# Patient Record
Sex: Female | Born: 1960 | Race: White | Hispanic: No | Smoking: Current every day smoker
Health system: Southern US, Community
[De-identification: ages and names within clinical notes are randomized; demographics above are authoritative.]

## PROBLEM LIST (undated history)

## (undated) DIAGNOSIS — D869 Sarcoidosis, unspecified: Secondary | ICD-10-CM

## (undated) DIAGNOSIS — R87619 Unspecified abnormal cytological findings in specimens from cervix uteri: Secondary | ICD-10-CM

## (undated) DIAGNOSIS — R609 Edema, unspecified: Secondary | ICD-10-CM

## (undated) DIAGNOSIS — D72829 Elevated white blood cell count, unspecified: Secondary | ICD-10-CM

## (undated) DIAGNOSIS — F329 Major depressive disorder, single episode, unspecified: Secondary | ICD-10-CM

## (undated) DIAGNOSIS — J329 Chronic sinusitis, unspecified: Secondary | ICD-10-CM

## (undated) DIAGNOSIS — N951 Menopausal and female climacteric states: Secondary | ICD-10-CM

## (undated) DIAGNOSIS — R600 Localized edema: Secondary | ICD-10-CM

## (undated) DIAGNOSIS — I10 Essential (primary) hypertension: Secondary | ICD-10-CM

## (undated) DIAGNOSIS — G894 Chronic pain syndrome: Secondary | ICD-10-CM

## (undated) DIAGNOSIS — M542 Cervicalgia: Secondary | ICD-10-CM

## (undated) DIAGNOSIS — F32A Depression, unspecified: Secondary | ICD-10-CM

## (undated) DIAGNOSIS — R945 Abnormal results of liver function studies: Secondary | ICD-10-CM

## (undated) DIAGNOSIS — E785 Hyperlipidemia, unspecified: Secondary | ICD-10-CM

## (undated) HISTORY — DX: Abnormal results of liver function studies: R94.5

## (undated) HISTORY — DX: Edema, unspecified: R60.9

## (undated) HISTORY — DX: Menopausal and female climacteric states: N95.1

## (undated) HISTORY — DX: Chronic sinusitis, unspecified: J32.9

## (undated) HISTORY — DX: Elevated white blood cell count, unspecified: D72.829

## (undated) HISTORY — DX: Hyperlipidemia, unspecified: E78.5

## (undated) HISTORY — DX: Major depressive disorder, single episode, unspecified: F32.9

## (undated) HISTORY — PX: TUBAL LIGATION: SHX77

## (undated) HISTORY — DX: Localized edema: R60.0

## (undated) HISTORY — DX: Sarcoidosis, unspecified: D86.9

## (undated) HISTORY — PX: OTHER SURGICAL HISTORY: SHX169

## (undated) HISTORY — DX: Unspecified abnormal cytological findings in specimens from cervix uteri: R87.619

## (undated) HISTORY — DX: Essential (primary) hypertension: I10

## (undated) HISTORY — DX: Cervicalgia: M54.2

## (undated) HISTORY — DX: Depression, unspecified: F32.A

---

## 1998-05-24 ENCOUNTER — Other Ambulatory Visit: Admission: RE | Admit: 1998-05-24 | Discharge: 1998-05-24 | Payer: Self-pay | Admitting: *Deleted

## 1998-11-12 ENCOUNTER — Emergency Department (HOSPITAL_COMMUNITY): Admission: EM | Admit: 1998-11-12 | Discharge: 1998-11-13 | Payer: Self-pay

## 1999-12-26 ENCOUNTER — Other Ambulatory Visit: Admission: RE | Admit: 1999-12-26 | Discharge: 1999-12-26 | Payer: Self-pay | Admitting: *Deleted

## 2000-03-26 ENCOUNTER — Ambulatory Visit (HOSPITAL_COMMUNITY): Admission: RE | Admit: 2000-03-26 | Discharge: 2000-03-26 | Payer: Self-pay | Admitting: *Deleted

## 2000-06-18 ENCOUNTER — Encounter: Payer: Self-pay | Admitting: *Deleted

## 2000-06-18 ENCOUNTER — Encounter: Admission: RE | Admit: 2000-06-18 | Discharge: 2000-06-18 | Payer: Self-pay | Admitting: *Deleted

## 2001-07-28 ENCOUNTER — Other Ambulatory Visit: Admission: RE | Admit: 2001-07-28 | Discharge: 2001-07-28 | Payer: Self-pay | Admitting: *Deleted

## 2001-09-14 ENCOUNTER — Other Ambulatory Visit: Admission: RE | Admit: 2001-09-14 | Discharge: 2001-09-14 | Payer: Self-pay | Admitting: *Deleted

## 2003-05-04 ENCOUNTER — Encounter: Payer: Self-pay | Admitting: Internal Medicine

## 2003-05-04 ENCOUNTER — Encounter: Admission: RE | Admit: 2003-05-04 | Discharge: 2003-05-04 | Payer: Self-pay | Admitting: Internal Medicine

## 2004-06-30 ENCOUNTER — Ambulatory Visit: Payer: Self-pay | Admitting: Internal Medicine

## 2004-07-08 ENCOUNTER — Ambulatory Visit: Payer: Self-pay | Admitting: Family Medicine

## 2004-10-13 ENCOUNTER — Encounter: Admission: RE | Admit: 2004-10-13 | Discharge: 2004-10-13 | Payer: Self-pay | Admitting: Obstetrics and Gynecology

## 2004-11-06 ENCOUNTER — Ambulatory Visit: Payer: Self-pay | Admitting: Family Medicine

## 2005-03-04 ENCOUNTER — Ambulatory Visit: Payer: Self-pay | Admitting: Internal Medicine

## 2005-07-28 ENCOUNTER — Ambulatory Visit: Payer: Self-pay | Admitting: Internal Medicine

## 2006-03-03 ENCOUNTER — Ambulatory Visit: Payer: Self-pay | Admitting: Internal Medicine

## 2006-09-15 ENCOUNTER — Ambulatory Visit: Payer: Self-pay | Admitting: Family Medicine

## 2006-12-03 ENCOUNTER — Ambulatory Visit: Payer: Self-pay | Admitting: Internal Medicine

## 2006-12-03 LAB — CONVERTED CEMR LAB
ALT: 55 units/L — ABNORMAL HIGH (ref 0–35)
AST: 48 units/L — ABNORMAL HIGH (ref 0–37)
Albumin: 4 g/dL (ref 3.5–5.2)
Alkaline Phosphatase: 84 units/L (ref 39–117)
BUN: 11 mg/dL (ref 6–23)
Bilirubin Urine: NEGATIVE
Bilirubin, Direct: 0.1 mg/dL (ref 0.0–0.3)
CO2: 23 meq/L (ref 19–32)
Calcium: 8.9 mg/dL (ref 8.4–10.5)
Chloride: 102 meq/L (ref 96–112)
Cholesterol: 141 mg/dL (ref 0–200)
Creatinine, Ser: 0.7 mg/dL (ref 0.40–1.20)
Glucose, Bld: 164 mg/dL — ABNORMAL HIGH (ref 70–99)
HCT: 45.1 % (ref 36.0–46.0)
HDL: 24 mg/dL — ABNORMAL LOW (ref >39)
Hemoglobin: 15.5 g/dL — ABNORMAL HIGH (ref 12.0–15.0)
Hgb A1c MFr Bld: 9.5 % — ABNORMAL HIGH (ref 4.6–6.1)
Indirect Bilirubin: 0.7 mg/dL (ref 0.0–0.9)
Ketones, ur: NEGATIVE mg/dL
LDL Cholesterol: 85 mg/dL (ref 0–99)
MCHC: 34.4 g/dL (ref 30.0–36.0)
MCV: 91.9 fL (ref 78.0–100.0)
Microalb, Ur: 2.7 mg/dL — ABNORMAL HIGH (ref 0.00–1.89)
Nitrite: POSITIVE — AB
Platelets: 257 10*3/uL (ref 150–400)
Potassium: 4.1 meq/L (ref 3.5–5.3)
Protein, ur: NEGATIVE mg/dL
RBC / HPF: NONE SEEN (ref <3)
RBC: 4.91 M/uL (ref 3.87–5.11)
RDW: 13.3 % (ref 11.5–14.0)
Sodium: 140 meq/L (ref 135–145)
Specific Gravity, Urine: 1.026 (ref 1.005–1.03)
TSH: 1.882 microintl units/mL (ref 0.350–5.50)
Total Bilirubin: 0.8 mg/dL (ref 0.3–1.2)
Total CHOL/HDL Ratio: 5.9
Total Protein: 7.3 g/dL (ref 6.0–8.3)
Triglycerides: 160 mg/dL — ABNORMAL HIGH (ref <150)
Urine Glucose: NEGATIVE mg/dL
Urobilinogen, UA: 0.2 (ref 0.0–1.0)
VLDL: 32 mg/dL (ref 0–40)
WBC: 12.5 10*3/uL — ABNORMAL HIGH (ref 4.0–10.5)
pH: 5.5 (ref 5.0–8.0)

## 2007-02-03 ENCOUNTER — Telehealth (INDEPENDENT_AMBULATORY_CARE_PROVIDER_SITE_OTHER): Payer: Self-pay | Admitting: *Deleted

## 2007-02-10 ENCOUNTER — Ambulatory Visit: Payer: Self-pay | Admitting: Internal Medicine

## 2007-02-10 DIAGNOSIS — E119 Type 2 diabetes mellitus without complications: Secondary | ICD-10-CM | POA: Insufficient documentation

## 2007-02-10 DIAGNOSIS — R87619 Unspecified abnormal cytological findings in specimens from cervix uteri: Secondary | ICD-10-CM

## 2007-02-10 DIAGNOSIS — E7800 Pure hypercholesterolemia, unspecified: Secondary | ICD-10-CM | POA: Insufficient documentation

## 2007-02-10 DIAGNOSIS — I10 Essential (primary) hypertension: Secondary | ICD-10-CM | POA: Insufficient documentation

## 2007-02-10 DIAGNOSIS — F329 Major depressive disorder, single episode, unspecified: Secondary | ICD-10-CM | POA: Insufficient documentation

## 2007-02-23 ENCOUNTER — Encounter: Payer: Self-pay | Admitting: Internal Medicine

## 2007-07-19 ENCOUNTER — Ambulatory Visit: Payer: Self-pay | Admitting: Internal Medicine

## 2007-07-19 LAB — CONVERTED CEMR LAB
Bilirubin Urine: NEGATIVE
Blood in Urine, dipstick: NEGATIVE
Glucose, Urine, Semiquant: NEGATIVE
Nitrite: NEGATIVE
Protein, U semiquant: NEGATIVE
RBC / HPF: NONE SEEN (ref <3)
Specific Gravity, Urine: 1.02
Urobilinogen, UA: NEGATIVE
WBC Urine, dipstick: NEGATIVE
WBC, UA: NONE SEEN cells/hpf (ref <3)
pH: 5

## 2007-07-20 ENCOUNTER — Encounter: Payer: Self-pay | Admitting: Internal Medicine

## 2007-07-27 ENCOUNTER — Telehealth: Payer: Self-pay | Admitting: Internal Medicine

## 2007-07-27 ENCOUNTER — Ambulatory Visit: Payer: Self-pay | Admitting: Internal Medicine

## 2007-07-27 LAB — CONVERTED CEMR LAB
HCV Ab: NEGATIVE
Hep A Total Ab: NEGATIVE
Hep B Core Total Ab: NEGATIVE
Hep B E Ab: NEGATIVE
Hep B S Ab: NEGATIVE

## 2007-07-29 LAB — CONVERTED CEMR LAB
ALT: 44 units/L — ABNORMAL HIGH (ref 0–35)
AST: 43 units/L — ABNORMAL HIGH (ref 0–37)
Albumin: 3.1 g/dL — ABNORMAL LOW (ref 3.5–5.2)
Alkaline Phosphatase: 74 units/L (ref 39–117)
BUN: 11 mg/dL (ref 6–23)
Bilirubin, Direct: 0.1 mg/dL (ref 0.0–0.3)
CO2: 29 meq/L (ref 19–32)
Calcium: 9.4 mg/dL (ref 8.4–10.5)
Chloride: 96 meq/L (ref 96–112)
Creatinine, Ser: 0.7 mg/dL (ref 0.4–1.2)
Creatinine,U: 331.2 mg/dL
GFR calc Af Amer: 115 mL/min
GFR calc non Af Amer: 95 mL/min
Glucose, Bld: 131 mg/dL — ABNORMAL HIGH (ref 70–99)
Hgb A1c MFr Bld: 7.7 % — ABNORMAL HIGH (ref 4.6–6.0)
Microalb Creat Ratio: 5.7 mg/g (ref 0.0–30.0)
Microalb, Ur: 1.9 mg/dL (ref 0.0–1.9)
Potassium: 3.9 meq/L (ref 3.5–5.1)
Sodium: 134 meq/L — ABNORMAL LOW (ref 135–145)
Total Bilirubin: 1.1 mg/dL (ref 0.3–1.2)
Total Protein: 8 g/dL (ref 6.0–8.3)

## 2007-10-03 ENCOUNTER — Telehealth: Payer: Self-pay | Admitting: Internal Medicine

## 2007-10-25 ENCOUNTER — Ambulatory Visit: Payer: Self-pay | Admitting: Internal Medicine

## 2007-10-25 DIAGNOSIS — N951 Menopausal and female climacteric states: Secondary | ICD-10-CM | POA: Insufficient documentation

## 2007-10-25 LAB — CONVERTED CEMR LAB
Bacteria, UA: NONE SEEN
Bilirubin Urine: NEGATIVE
Glucose, Urine, Semiquant: NEGATIVE
Ketones, urine, test strip: NEGATIVE
RBC / HPF: NONE SEEN (ref <3)
Specific Gravity, Urine: 1.01
WBC, UA: NONE SEEN cells/hpf (ref <3)
pH: 5

## 2007-10-26 ENCOUNTER — Encounter: Payer: Self-pay | Admitting: Internal Medicine

## 2008-02-01 ENCOUNTER — Telehealth (INDEPENDENT_AMBULATORY_CARE_PROVIDER_SITE_OTHER): Payer: Self-pay | Admitting: *Deleted

## 2008-02-08 ENCOUNTER — Ambulatory Visit: Payer: Self-pay | Admitting: Internal Medicine

## 2008-02-08 DIAGNOSIS — F909 Attention-deficit hyperactivity disorder, unspecified type: Secondary | ICD-10-CM | POA: Insufficient documentation

## 2008-02-08 LAB — CONVERTED CEMR LAB
Nitrite: NEGATIVE
Specific Gravity, Urine: >=1.03
Urobilinogen, UA: 0.2
pH: 5

## 2008-02-09 ENCOUNTER — Ambulatory Visit: Payer: Self-pay | Admitting: Internal Medicine

## 2008-02-10 ENCOUNTER — Encounter: Payer: Self-pay | Admitting: Internal Medicine

## 2008-02-15 ENCOUNTER — Telehealth (INDEPENDENT_AMBULATORY_CARE_PROVIDER_SITE_OTHER): Payer: Self-pay | Admitting: *Deleted

## 2008-02-15 LAB — CONVERTED CEMR LAB
ALT: 38 units/L — ABNORMAL HIGH (ref 0–35)
AST: 38 units/L — ABNORMAL HIGH (ref 0–37)
Albumin: 3.7 g/dL (ref 3.5–5.2)
BUN: 9 mg/dL (ref 6–23)
CO2: 29 meq/L (ref 19–32)
Chloride: 103 meq/L (ref 96–112)
Creatinine, Ser: 0.6 mg/dL (ref 0.4–1.2)
Glucose, Bld: 202 mg/dL — ABNORMAL HIGH (ref 70–99)
Hgb A1c MFr Bld: 8.5 % — ABNORMAL HIGH (ref 4.6–6.0)
Total Bilirubin: 0.7 mg/dL (ref 0.3–1.2)
Total Protein: 7.6 g/dL (ref 6.0–8.3)

## 2008-06-01 ENCOUNTER — Ambulatory Visit: Payer: Self-pay | Admitting: Internal Medicine

## 2008-06-06 ENCOUNTER — Telehealth (INDEPENDENT_AMBULATORY_CARE_PROVIDER_SITE_OTHER): Payer: Self-pay | Admitting: *Deleted

## 2008-06-06 LAB — CONVERTED CEMR LAB
ALT: 41 units/L — ABNORMAL HIGH (ref 0–35)
Alkaline Phosphatase: 65 units/L (ref 39–117)
Bilirubin, Direct: 0.1 mg/dL (ref 0.0–0.3)
Eosinophils Relative: 1.2 % (ref 0.0–5.0)
HCT: 40.2 % (ref 36.0–46.0)
HDL: 19.7 mg/dL — ABNORMAL LOW (ref >39.0)
Monocytes Absolute: 0.6 10*3/uL (ref 0.1–1.0)
Monocytes Relative: 5.5 % (ref 3.0–12.0)
Platelets: 285 10*3/uL (ref 150–400)
RBC: 4.38 M/uL (ref 3.87–5.11)
Total CHOL/HDL Ratio: 8.8
Total Protein: 8 g/dL (ref 6.0–8.3)
WBC: 11.4 10*3/uL — ABNORMAL HIGH (ref 4.5–10.5)

## 2008-07-27 ENCOUNTER — Ambulatory Visit: Payer: Self-pay | Admitting: Internal Medicine

## 2008-11-30 ENCOUNTER — Encounter: Payer: Self-pay | Admitting: Internal Medicine

## 2009-01-21 ENCOUNTER — Ambulatory Visit: Payer: Self-pay | Admitting: Internal Medicine

## 2009-02-07 ENCOUNTER — Ambulatory Visit: Payer: Self-pay | Admitting: Internal Medicine

## 2009-02-08 ENCOUNTER — Encounter (INDEPENDENT_AMBULATORY_CARE_PROVIDER_SITE_OTHER): Payer: Self-pay | Admitting: *Deleted

## 2009-02-13 ENCOUNTER — Telehealth: Payer: Self-pay | Admitting: Internal Medicine

## 2009-02-13 ENCOUNTER — Encounter (INDEPENDENT_AMBULATORY_CARE_PROVIDER_SITE_OTHER): Payer: Self-pay | Admitting: *Deleted

## 2009-02-13 ENCOUNTER — Telehealth (INDEPENDENT_AMBULATORY_CARE_PROVIDER_SITE_OTHER): Payer: Self-pay | Admitting: *Deleted

## 2009-02-13 LAB — CONVERTED CEMR LAB
Albumin: 3.7 g/dL (ref 3.5–5.2)
Cholesterol: 168 mg/dL (ref 0–200)
GFR calc non Af Amer: 139.49 mL/min (ref >60)
Hgb A1c MFr Bld: 8.4 % — ABNORMAL HIGH (ref 4.6–6.5)
LDL Cholesterol: 114 mg/dL — ABNORMAL HIGH (ref 0–99)
Potassium: 4.3 meq/L (ref 3.5–5.1)
Sodium: 142 meq/L (ref 135–145)
Total Protein: 8.1 g/dL (ref 6.0–8.3)
Triglycerides: 185 mg/dL — ABNORMAL HIGH (ref 0.0–149.0)
VLDL: 37 mg/dL (ref 0.0–40.0)
Vit D, 25-Hydroxy: 24 ng/mL — ABNORMAL LOW (ref 30–89)

## 2009-02-20 ENCOUNTER — Ambulatory Visit: Payer: Self-pay | Admitting: Gastroenterology

## 2009-03-11 ENCOUNTER — Ambulatory Visit: Payer: Self-pay | Admitting: Gastroenterology

## 2009-03-13 ENCOUNTER — Ambulatory Visit: Payer: Self-pay | Admitting: Endocrinology

## 2009-03-15 ENCOUNTER — Encounter: Admission: RE | Admit: 2009-03-15 | Discharge: 2009-06-13 | Payer: Self-pay | Admitting: Internal Medicine

## 2009-04-26 ENCOUNTER — Telehealth: Payer: Self-pay | Admitting: Internal Medicine

## 2009-04-26 ENCOUNTER — Encounter: Payer: Self-pay | Admitting: Internal Medicine

## 2009-05-17 ENCOUNTER — Ambulatory Visit: Payer: Self-pay | Admitting: Internal Medicine

## 2009-07-16 ENCOUNTER — Ambulatory Visit: Payer: Self-pay | Admitting: Internal Medicine

## 2009-07-16 DIAGNOSIS — J329 Chronic sinusitis, unspecified: Secondary | ICD-10-CM

## 2009-07-16 HISTORY — DX: Chronic sinusitis, unspecified: J32.9

## 2009-10-09 ENCOUNTER — Encounter: Payer: Self-pay | Admitting: Internal Medicine

## 2009-10-29 ENCOUNTER — Ambulatory Visit: Payer: Self-pay | Admitting: Internal Medicine

## 2009-10-29 DIAGNOSIS — M79609 Pain in unspecified limb: Secondary | ICD-10-CM | POA: Insufficient documentation

## 2010-05-28 ENCOUNTER — Telehealth (INDEPENDENT_AMBULATORY_CARE_PROVIDER_SITE_OTHER): Payer: Self-pay | Admitting: *Deleted

## 2010-05-28 ENCOUNTER — Ambulatory Visit: Payer: Self-pay | Admitting: Internal Medicine

## 2010-05-28 ENCOUNTER — Telehealth: Payer: Self-pay | Admitting: Internal Medicine

## 2010-05-28 LAB — CONVERTED CEMR LAB
Glucose, Urine, Semiquant: 500
Urobilinogen, UA: 0.2
pH: 6

## 2010-05-29 ENCOUNTER — Encounter: Payer: Self-pay | Admitting: Internal Medicine

## 2010-06-02 ENCOUNTER — Encounter: Payer: Self-pay | Admitting: Internal Medicine

## 2010-08-31 ENCOUNTER — Encounter: Payer: Self-pay | Admitting: Internal Medicine

## 2010-09-09 NOTE — Assessment & Plan Note (Signed)
Summary: FOR A UTI //PH   Vital Signs:  Patient profile:   50 year old female Height:      66.75 inches Weight:      229.13 pounds BMI:     36.29 Pulse rate:   95 / minute Pulse rhythm:   regular BP sitting:   126 / 88  (left arm) Cuff size:   large  Vitals Entered By: Army Fossa CMA (May 28, 2010 11:27 AM) CC: Pt here c/o frequent urination x 2 weeks Comments [facility] [address]     History of Present Illness: acute visit for a UTI... For few weeks, she has noticed urinary frequency and urgency. No dysuria.  we also have to discuss her chronic issues....  Diabetes-- runned out of meds x 2 weeks ($$); before that, she self decrease Actos to half tablet a day and metformin to only 3 tablets daily. Even with a decrease of medications, her CBGs were good. she is concerned about the side effects of Actos (bladder cancer ) and metformin  Hypertension-- out of meds x 2 weeks    DYSLIPIDEMIA--out of meds x 2 weeks   abnormal Paps, was referred to gynecology last year, patient reports she did go, PAP was normal  ROS No fever No nausea vomiting No abdominal pain or flank pain No vaginal discharge or vaginal rash Diet is not as good as before but she is exercising more     Current Medications (verified): 1)  Januvia 100 Mg  Tabs (Sitagliptin Phosphate) .... One P.o. Daily 2)  Claritin 10 Mg  Tabs (Loratadine) .Marland Kitchen.. 1 By Mouth Once Daily 3)  Ibuprofen 200 Mg  Caps (Ibuprofen) .... 2 By Mouth Once Daily 4)  Ziac 2.5-6.25 Mg  Tabs (Bisoprolol-Hydrochlorothiazide) .Marland Kitchen.. 1 By Mouth Once Daily 5)  Lexapro 10 Mg  Tabs (Escitalopram Oxalate) .... 1.5 To 2 By Mouth Once Daily 6)  Simvastatin 20 Mg Tabs (Simvastatin) .Marland Kitchen.. 1 By Mouth At Bedtime 7)  Metformin Hcl 500 Mg Xr24h-Tab (Metformin Hcl) .... 4 Qd 8)  Actos 45 Mg Tabs (Pioglitazone Hcl) .Marland Kitchen.. 1 Qd 9)  Bayer Low Strength 81 Mg Tbec (Aspirin) .... 3 By Mouth Once Daily 10)  Fish Oil 11)  Vicodin 5-500 Mg Tabs  (Hydrocodone-Acetaminophen) .Marland Kitchen.. 1 or 2 By Mouth  Q  8 Hours As Needed  Allergies (verified): 1)  ! Prozac 2)  ! Effexor 3)  ! Erythromycin  Past History:  Past Medical History: Reviewed history from 02/07/2009 and no changes required. Sarcoid-post partum- on  Remission Diabetes mellitus, type II Depression Hypertension MENOPAUSAL SYNDROME  DYSLIPIDEMIA PAP SMEAR, ABNORMAL , h/o HPV. Repeated PAP neg   Past Surgical History: Reviewed history from 02/07/2009 and no changes required. 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   Lungs:  Normal respiratory effort, chest expands symmetrically. Lungs are clear to auscultation, no crackles or wheezes. Heart:  normal rate, regular rhythm, no murmur, and no gallop.   Abdomen:  soft, non-tender, no distention, no masses, no guarding, and no rigidity.  no CVA tenderness Extremities:  no lower extremity pretibial edema   Psych:  not anxious  appearing and not depressed appearing.     Impression & Recommendations:  Problem # 1:  UTI? UA negative, we'll send a urine culture Symptoms may be related to poorly controlled diabetes  Problem # 2:  HYPERTENSION (ICD-401.9) BP today good despite poor compliance, no change Her updated medication list for this problem includes:    Ziac 2.5-6.25 Mg Tabs (Bisoprolol-hydrochlorothiazide) .Marland Kitchen... 1 by mouth once daily  BP today: 126/88 Prior BP: 128/70 (10/29/2009)  Labs Reviewed: K+: 4.3 (02/07/2009) Creat: : 0.5 (02/07/2009)   Chol: 168 (02/07/2009)   HDL: 17.20 (02/07/2009)   LDL: 114 (02/07/2009)    TG: 185.0 (02/07/2009)  Problem # 3:  DYSLIPIDEMIA (ICD-272.4) poor compliance, medications refilled Her updated medication list for this problem includes:    Simvastatin 20 Mg Tabs (Simvastatin) .Marland Kitchen... 1 by mouth at bedtime  Labs Reviewed: SGOT: 51 (02/07/2009)   SGPT: 57 (02/07/2009)   HDL:17.20 (02/07/2009), 19.7 (06/01/2008)  LDL:114 (02/07/2009), 123 (06/01/2008)  Chol:168 (02/07/2009), 174 (06/01/2008)  Trig:185.0 (02/07/2009), 159 (06/01/2008)  Problem # 4:  DIABETES MELLITUS, TYPE II (ICD-250.00) poor compliance Additionally, she self decrease Actos to half tablet a day and metformin to only 3 tablets daily. Even with a decrease of medications, her CBGs were good consequently will decrease her routine doses.  Her updated medication list for this problem includes:    Januvia 100 Mg Tabs (Sitagliptin phosphate) ..... One p.o. daily    Metformin Hcl 500 Mg Xr24h-tab (Metformin hcl) .Marland KitchenMarland KitchenMarland KitchenMarland Kitchen 3 by mouth once daily    Actos 30 Mg Tabs (Pioglitazone hcl) .Marland Kitchen... 1 by mouth once daily    Bayer Low Strength 81 Mg Tbec (Aspirin) .Marland KitchenMarland KitchenMarland KitchenMarland Kitchen 3 by mouth once daily  Problem # 5:  HEALTH SCREENING (ICD-V70.0) abnormal Paps, was referred to gynecology last year, patient reports she did go, PAP was normal  Problem # 6:  time spent face-to-face more than 25 minutes trying to figure out a  plan for her diabetes treatment  Complete Medication List: 1)  Januvia 100 Mg Tabs (Sitagliptin phosphate) .... One p.o. daily 2)  Claritin 10 Mg Tabs (Loratadine) .Marland Kitchen.. 1 by mouth once daily 3)  Ibuprofen 200 Mg Caps (Ibuprofen) .... 2 by mouth once daily 4)  Ziac 2.5-6.25 Mg Tabs (Bisoprolol-hydrochlorothiazide) .Marland Kitchen.. 1 by mouth once daily 5)  Lexapro 10 Mg Tabs (Escitalopram oxalate) .... 1.5 to 2 by mouth once daily 6)  Simvastatin 20 Mg Tabs (Simvastatin) .Marland Kitchen.. 1 by mouth at bedtime 7)  Metformin Hcl 500 Mg Xr24h-tab (Metformin hcl) .... 3 by mouth once daily 8)  Actos 30 Mg Tabs (Pioglitazone hcl) .Marland Kitchen.. 1 by mouth  once daily 9)  Bayer Low Strength 81 Mg Tbec (Aspirin) .... 3 by mouth once daily 10)  Fish Oil  11)  Vicodin 5-500 Mg Tabs (Hydrocodone-acetaminophen) .Marland Kitchen.. 1 or 2 by mouth  q  8 hours as needed  Other Orders: Admin 1st Vaccine ([address]) Flu Vaccine 21yrs + ([address]) T-Urine Culture (Spectrum Order) [id number])  Patient Instructions: 1)  take the  medicines as prescribed every day 2)  Diet and exercise! 3)  Get your yearly eye check 4)  Come back in 6 weeks, fasting Prescriptions: VICODIN 5-500 MG TABS (HYDROCODONE-ACETAMINOPHEN) 1 or 2 by mouth  q  8 hours as needed  #40 x 0   Entered and Authorized by:   Elita Quick E. Khalon Cansler MD   Signed by:   Nolon Rod. Tyheim Vanalstyne MD on 05/28/2010   Method used:   Print then Give to Patient   RxID:  [id number] METFORMIN HCL 500 MG XR24H-TAB (METFORMIN HCL) 3 by mouth once daily  #90 x 3   Entered and Authorized by:   Elita Quick E. Nadezhda Pollitt MD   Signed by:   Nolon Rod. Tenee Wish MD on 05/28/2010   Method used:   Electronically to        [facility] [address] [place] (retail)       [phone]       [place], [place]  [address]       Ph: [phone]       Fax: [phone]   RxID:   [id number] ACTOS 30 MG TABS (PIOGLITAZONE HCL) 1 by mouth once daily  #30 x 3   Entered and Authorized by:   Nolon Rod. Latecia Miler MD   Signed by:   Nolon Rod. Chyanna Flock MD on 05/28/2010   Method used:   Electronically to        [facility] [address] [place] (retail)       [phone]       [place], [place]  [address]       Ph: [phone]       Fax: [phone]   RxID:   [id number] JANUVIA 100 MG  TABS (SITAGLIPTIN PHOSPHATE) one p.o. daily  #30 Each x 3   Entered by:   Army Fossa CMA   Authorized by:   Nolon Rod. Jemiah Cuadra MD   Signed by:   Nolon Rod. Trevor Wilkie MD on 05/28/2010   Method used:   Electronically to        [facility] [address] [place] (retail)       [phone]       [place], [place]  [address]       Ph:  [phone]       Fax: [phone]   RxID:   [id number] SIMVASTATIN 20 MG TABS (SIMVASTATIN) 1 by mouth at bedtime  #30 Each x 3   Entered by:   Army Fossa CMA   Authorized by:   Nolon Rod. Climmie Cronce MD   Signed by:   Nolon Rod. Lariyah Shetterly MD on 05/28/2010   Method used:   Electronically to        [facility] [address] [place] (retail)       [phone]       [place], [place]  [address]       Ph: [phone]       Fax: [phone]   RxID:   [id number] LEXAPRO 10 MG  TABS (ESCITALOPRAM OXALATE) 1.5 to 2 by mouth once daily  #90 x 3   Entered by:   Army Fossa CMA   Authorized by:   Nolon Rod. Beryle Zeitz MD   Signed by:   Nolon Rod. Elex Mainwaring MD on 05/28/2010   Method used:   Electronically to        [facility] [address] [place] (retail)       [phone]       [place], [place]  [address]       Ph: [phone]       Fax: [phone]   RxID:   [id number] ZIAC 2.5-6.25 MG  TABS (BISOPROLOL-HYDROCHLOROTHIAZIDE) 1 by mouth once daily  #90 x 3   Entered by:   Army Fossa CMA   Authorized by:  Aeon Kessner E. Tabria Steines MD   Signed by:   Nolon Rod. Charon Smedberg MD on 05/28/2010   Method used:   Electronically to        [facility] [address] [place] (retail)       [phone]       [place], [place]  [address]       Ph: [phone]       Fax: [phone]   RxID:   [id number]    Orders Added: 1)  Admin 1st Vaccine [90471] 2)  Flu Vaccine 47yrs + [[address]] 3)  T-Urine Culture (Spectrum Order) [[id number]] 4)  Est. Patient Level IV [03474]    Laboratory Results   Urine Tests    Routine Urinalysis   Color: yellow Appearance: Clear Glucose: 500   (Normal Range: Negative) Bilirubin: small   (Normal Range: Negative) Ketone: negative   (Normal Range: Negative) Spec. Gravity: 1.015   (Normal Range: 1.003-1.035) Blood: negative   (Normal Range: Negative) pH: 6.0   (Normal Range: 5.0-8.0) Protein: negative   (Normal  Range: Negative) Urobilinogen: 0.2   (Normal Range: 0-1) Nitrite: negative   (Normal Range: Negative) Leukocyte Esterace: negative   (Normal Range: Negative)    Comments: Army Fossa CMA  May 28, 2010 11:43 AM         Flu Vaccine Consent Questions     Do you have a history of severe allergic reactions to this vaccine? no    Any prior history of allergic reactions to egg and/or gelatin? no    Do you have a sensitivity to the preservative Thimersol? no    Do you have a past history of Guillan-Barre Syndrome? no    Do you currently have an acute febrile illness? no    Have you ever had a severe reaction to latex? no    Vaccine information given and explained to patient? yes    Are you currently pregnant? no    Lot Number:[id number]   Exp Date:02/07/2011   Site Given  Left Deltoid IM  .lbflu

## 2010-09-09 NOTE — Medication Information (Signed)
Summary: Prior Authorization & Approval for Lexapro/[employer]  Prior Authorization & Approval for Lexapro/[employer]   Imported By: Lanelle Bal 06/10/2010 10:12:19  _____________________________________________________________________  External Attachment:    Type:   Image     Comment:   External Document

## 2010-09-09 NOTE — Progress Notes (Signed)
Summary: Re-send Rx  Phone Note Call from Patient   Caller: Patient Summary of Call: pt called and wants rx's sent to [facility] Initial call taken by: Lavell Islam,  May 28, 2010 12:35 PM    Prescriptions: ACTOS 30 MG TABS (PIOGLITAZONE HCL) 1 by mouth once daily  #30 x 3   Entered by:   Army Fossa CMA   Authorized by:   Nolon Rod. Paz MD   Signed by:   Army Fossa CMA on 05/28/2010   Method used:   Electronically to        [facility]* (retail)       [address] [place], [place]  [address]       Ph: [phone]       Fax: [phone]   RxID:   [id number] METFORMIN HCL 500 MG XR24H-TAB (METFORMIN HCL) 3 by mouth once daily  #90 x 3   Entered by:   Army Fossa CMA   Authorized by:   Nolon Rod. Paz MD   Signed by:   Army Fossa CMA on 05/28/2010   Method used:   Electronically to        [facility]* (retail)       [address] [place], [place]  [address]       Ph: [phone]       Fax: [phone]   RxID:   [id number] SIMVASTATIN 20 MG TABS (SIMVASTATIN) 1 by mouth at bedtime  #30 Each x 3   Entered by:   Army Fossa CMA   Authorized by:   Nolon Rod. Paz MD   Signed by:   Army Fossa CMA on 05/28/2010   Method used:   Electronically to        [facility]* (retail)       [address] [place], [place]  [address]       Ph: [phone]       Fax: [phone]   RxID:   [id number] LEXAPRO 10 MG  TABS (ESCITALOPRAM OXALATE) 1.5 to 2 by mouth once daily  #90 x 3   Entered by:   Army Fossa CMA   Authorized by:   Nolon Rod. Paz MD   Signed by:   Army Fossa CMA on 05/28/2010   Method used:   Electronically to        [facility]* (retail)       [address] [place], [place]  [address]       Ph: [phone]       Fax: [phone]   RxID:   [id number] ZIAC 2.5-6.25 MG  TABS  (BISOPROLOL-HYDROCHLOROTHIAZIDE) 1 by mouth once daily  #90 x 3   Entered by:   Army Fossa CMA   Authorized by:   Nolon Rod. Paz MD   Signed by:   Army Fossa CMA on 05/28/2010   Method used:   Electronically to        [facility]* (retail)       [address] [place], [place]  [address]  Ph: [phone]       Fax: [phone]   RxID:   [id number] JANUVIA 100 MG  TABS (SITAGLIPTIN PHOSPHATE) one p.o. daily  #30 Each x 3   Entered by:   Army Fossa CMA   Authorized by:   Nolon Rod. Paz MD   Signed by:   Army Fossa CMA on 05/28/2010   Method used:   Electronically to        [facility]* (retail)       [address] [place], [place]  [address]       Ph: [phone]       Fax: [phone]   RxID:   [id number]

## 2010-09-09 NOTE — Progress Notes (Signed)
Summary: Prior Auth--Lexapro  Phone Note Refill Request Call back at [phone] Message from:  Pharmacy on May 28, 2010 1:17 PM  Refills Requested: Medication #1:  LEXAPRO 10 MG  TABS 1.5 to 2 by mouth once daily   Dosage confirmed as above?Dosage Confirmed   Supply Requested: 3 months Prior Auth from [facility] on [address] [phone]   Next Appointment Scheduled: today Initial call taken by: Harold Barban,  May 28, 2010 1:17 PM  Follow-up for Phone Call        I have the form for this one, so I am already aware that the preferred alternatives are Citalopram, fluoxetine, paroxetine, and sertraline. Please advise. Follow-up by: Lucious Groves CMA,  May 28, 2010 3:59 PM  Additional Follow-up for Phone Call Additional follow up Details #1::        she is doing very well on Lexapro I don't think is appropriate to switch her  medication around. Additional Follow-up by: Nolon Rod. Paz MD,  May 30, 2010 8:35 AM    Additional Follow-up for Phone Call Additional follow up Details #2::    Forms requested. Lucious Groves CMA  May 30, 2010 11:47 AM   Forms completed and faxed, will await insurance company reply. Lucious Groves CMA  June 02, 2010 8:19 AM    Appended Document: Prior Auth--Lexapro Approved until 2014.

## 2010-09-09 NOTE — Letter (Signed)
Summary: Patient No Show/[facility]  Patient No Show/[facility]   Imported By: Lanelle Bal 10/17/2009 11:30:18  _____________________________________________________________________  External Attachment:    Type:   Image     Comment:   External Document

## 2010-09-09 NOTE — Assessment & Plan Note (Signed)
Summary: pain in heels/kdc   Vital Signs:  Patient profile:   50 year old female Height:      66.75 inches Weight:      229.6 pounds Pulse rate:   78 / minute BP sitting:   128 / 70  Vitals Entered By: R.Peeler CC: pain in both heels Comments Pain in both heels (achilles tendon)-x 1wk-constant, always walking ibuprofen helps a little   History of Present Illness: a week ago had a sudden onset of severe pain at the right heel by  the site of the Achilles' tendon insertion pain is getting better area was swollen, now is getting better    Allergies: 1)  ! Prozac 2)  ! Effexor 3)  ! Erythromycin  Past History:  Past Medical History: Reviewed history from 02/07/2009 and no changes required. Sarcoid-post partum- on  Remission Diabetes mellitus, type II Depression Hypertension MENOPAUSAL SYNDROME  DYSLIPIDEMIA PAP SMEAR, ABNORMAL , h/o HPV. Repeated PAP neg   Past Surgical History: Reviewed history from 02/07/2009 and no changes required. Tubal ligation pilonidal cyst removal abdominal plasty remotely   Review of Systems       besides . the acute  Achilles  tendon pain, she also has mild, chronic, bilateral heel pain  Physical Exam  General:  alert, well-developed, and well-nourished.   Extremities:  no lower extremity pretibial edema left food normal  right foot: Normal to inspection, slightly tender at the site of the Achilles' tendon insertion in the heel without warmness or redness Achilles tendon apperas intact   Impression & Recommendations:  Problem # 1:  HEEL PAIN, BILATERAL (ICD-729.5) presents with two problems:  Achillis tendinitis, no evidence of rupture on exam recommend ice, continue with ibuprofen, Vicodin as needed,  patient to call in few weeks if no better  bilateral heel pain, likely mild fasciitis recommend stretching on a shoe insert  Problem # 2:  DIABETES MELLITUS, TYPE II (ICD-250.00) samples provided Her updated medication  list for this problem includes:    Januvia 100 Mg Tabs (Sitagliptin phosphate) ..... One p.o. daily    Metformin Hcl 500 Mg Xr24h-tab (Metformin hcl) .Marland KitchenMarland KitchenMarland KitchenMarland Kitchen 4 qd    Actos 45 Mg Tabs (Pioglitazone hcl) .Marland Kitchen... 1 qd    Bayer Low Strength 81 Mg Tbec (Aspirin) .Marland KitchenMarland KitchenMarland KitchenMarland Kitchen 3 by mouth once daily  Complete Medication List: 1)  Januvia 100 Mg Tabs (Sitagliptin phosphate) .... One p.o. daily 2)  Claritin 10 Mg Tabs (Loratadine) .Marland Kitchen.. 1 by mouth once daily 3)  Ibuprofen 200 Mg Caps (Ibuprofen) .... 2 by mouth once daily 4)  Ziac 2.5-6.25 Mg Tabs (Bisoprolol-hydrochlorothiazide) .Marland Kitchen.. 1 by mouth once daily 5)  Lexapro 10 Mg Tabs (Escitalopram oxalate) .... 1.5 to 2 by mouth once daily 6)  Mvi  7)  Simvastatin 20 Mg Tabs (Simvastatin) .Marland Kitchen.. 1 by mouth at bedtime 8)  Metformin Hcl 500 Mg Xr24h-tab (Metformin hcl) .... 4 qd 9)  Actos 45 Mg Tabs (Pioglitazone hcl) .Marland Kitchen.. 1 qd 10)  Bayer Low Strength 81 Mg Tbec (Aspirin) .... 3 by mouth once daily 11)  Fish Oil  12)  Calcium & Vit D  .... 2 daily 13)  Vicodin 5-500 Mg Tabs (Hydrocodone-acetaminophen) .Marland Kitchen.. 1 or 2 by mouth  q  8 hours as needed Prescriptions: VICODIN 5-500 MG TABS (HYDROCODONE-ACETAMINOPHEN) 1 or 2 by mouth  q  8 hours as needed  #40 x 0   Entered and Authorized by:   Elita Quick E. Carlisia Geno MD   Signed by:   Nolon Rod.  Siera Beyersdorf MD on 10/29/2009   Method used:   Print then Give to Patient   RxID:   [id number]

## 2010-11-15 LAB — GLUCOSE, CAPILLARY
Glucose-Capillary: 167 mg/dL — ABNORMAL HIGH (ref 70–99)
Glucose-Capillary: 187 mg/dL — ABNORMAL HIGH (ref 70–99)

## 2010-12-26 NOTE — H&P (Signed)
[facility]  Patient:    Alexandra Parsons, Alexandra Parsons                        MRN: [id number] Adm. Date:  03/26/00 Attending:  Sung Amabile. Roslyn Smiling, M.D.                         History and Physical  DATE OF BIRTH:                Feb 16, 1961  HISTORY OF PRESENT ILLNESS:   The patient is a 50 year old woman, G6, P1-0-5-1, who is admitted for laparoscopic tubal ligation. She requests sterilization. She has been counselled regarding the benefits, risks, and options to this procedure prior to the surgery, including vasectomy and reversible forms of contraception.  PAST MEDICAL HISTORY:         Medical sarcoidosis, gestational diabetes, borderline hypertension, history of HPV.  PAST SURGICAL HISTORY:        Abdominoplasty in 1999, pilonidal cyst 1985, vaginal delivery 1990.  ALLERGIES:                    ERYTHROMYCIN.  CURRENT MEDICATIONS:          None.  FAMILY HISTORY:               Mother and father alive and well.  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 Smokes 10 cigarettes daily. Denies ethanol use.  PHYSICAL EXAMINATION:  GENERAL:                      Moderately obese woman.  VITAL SIGNS:                  Blood pressure 140/80.  HEENT:                        Within normal limits.  NECK:                         Without thyromegaly.  CHEST:                        Clear.  CARDIAC:                      Regular rate and rhythm. S1/S2 normal.  BREASTS:                      Without mass, tenderness, axillary or supraclavicular nodes.  ABDOMEN:                      Soft, nontender without organomegaly, mass, or hernia. There are well-healed abdominoplasty scars.  GENITOURINARY:                External genitalia and BUS and vagina without lesions. Cervix without lesion. Uterus anteverted. Normal size, nontender. Adnexa normal to palpation.  MUSCULOSKELETAL:              Back without CVAT.  SKIN:                          Without lesion.  EXTREMITIES:  Without clubbing, cyanosis, or edema.  NEUROLOGICAL:                 Grossly intact.  LABORATORY DATA:              Pap smear Dec 26, 1999, within normal limits.  IMPRESSION:                   Request for sterilization. Benefits and risks have been reviewed prior to surgery. Open technique laparoscopy will be performed for the procedure because of previous abdominoplasty.DD:  03/24/00 TD:  03/24/00 Job: [id number]

## 2010-12-26 NOTE — Op Note (Signed)
[facility]  Patient:    Alexandra Parsons, Alexandra Parsons                      MRN: [id number] Proc. Date: 03/26/00 Adm. Date:  [id number] Disc. Date: [id number] Attending:  Ardeen Fillers CC:         Sung Amabile. Roslyn Smiling, M.D.   Operative Report  PREOPERATIVE DIAGNOSIS:  Request for sterilization.  Previous abdominoplasty.  POSTOPERATIVE DIAGNOSIS:  Request for sterilization.  Previous abdominoplasty.  OPERATION:  Open laparoscopic tubal ligation via cautery.  SURGEON:  Sung Amabile. Roslyn Smiling, M.D.  ASSISTANT:  Dr. Marina Gravel.  ANESTHESIA:  General.  Endotracheal tube intubation.  ESTIMATED BLOOD LOSS:  Less than 10 cc.  TUBES AND DRAINS:  None.  COMPLICATIONS:  None.  OPERATIVE FINDINGS:  Scarring and adhesionssuperior umbilically on the anterior abdominal wall secondary to previous abdominoplasty.  Normal tubes and ovaries and uterus.  SPECIMENS:  None.  INDICATIONS:  The patient is a 50 year old woman G6, P1-0-5-1, admitted for laparoscopic tubal ligation.  She has been counseled regarding the benefits, risks and options for this procedure prior to surgery including vasectomy and reversible forms of contraception.  Her past history is significant for having had an abdominoplasty for obesity.  DESCRIPTION OF PROCEDURE:  After the establishment of general anesthesia, the patient was placed in the dorsal lithotomy position.  The abdomen and perineum and vagina were prepped with Betadine solution.  The bladder was evacuated with straight catheterization.  A bivalve speculum was inserted in the vagina and the cervix was reprepped with Betadine solution.  It was visualized and grasped with the anterior cervical lip with a single-toothed tenaculum.  A Hulka tenaculum was attached to the cervix and the speculum was removed.  The patient was draped.  A transverse subumbilical incision was made through previous umbilical scar. The incision was carried through the  fat to the level of the fascia encountering scarring.  The skin incision was extended in order to facilitate visualization of the operative area.  It was necessary to have an assistant in order to identify the structures and have adequate retraction thus Dr. Earlene Plater scrubbed in.  The fascia was identified and grasped with Kochers.  It was elevated and enterd sharply.  The fascial incision was carried out laterally and stay sutures of 0 Vicryl were placed at either angle of the fascial incision. Further dissection was done in order to get to the peritoneum.  The peritoneum was grasped in a clear space with hemostats and entered sharply taking care to avoid injury to underlying viscera.  Disposable open laparoscopic sleeve and trocar were inserted.  This was secured with the bilateral fascial sutures. Laparoscope was inserted directly.  Kleppinger forceps were used for cauterization.  Attention was first drawn to the right adnexa where the right tube was visualized and followed to its fimbriated end.  It was grasped in the midportion and cauterized there and at two other points directly adjacent to the original cauterization.  Attention was drawn to the left adnexa where the left tube was visualized and followed to its fimbriated end.  It was grasped in the midportion and cauterized there and at two other points directly adjacent to the original cauterization site. Hemostasis was noted.  A photograph was taken of the pelvis.  Because of the anatomy, the liver could not be visualized. Instruments were removed and the pneumoperitoneum which had been instilled upon placement of the laparoscopic sleeve, was evacuated through the  sleeve. The laparoscopic sleeve was removed.  The abdominal wall was closed in layers.  The fascia was closed with a running stitch of 0 Vicryl.  The fat was irrigated with warm normal saline. Hemostasis was noted.  The fat was reapproximated with 0 Vicryl  suture material using two interrupted stitches.  15 cc of 0.5% Marcaine were infiltrated into the skin edges.  The skin was reapproximated with a subcuticular stitch of 4-0 chromic.  Final sponge, needle and blade counts were correct.  Vaginal instruments were removed.  The patient was returned to supine position, extubated without difficulty and transported to the recovery room in satisfactory condition. DD:  03/26/00 TD:  03/28/00 Job: [id number]

## 2011-05-20 ENCOUNTER — Ambulatory Visit (INDEPENDENT_AMBULATORY_CARE_PROVIDER_SITE_OTHER): Payer: BC Managed Care – PPO | Admitting: Internal Medicine

## 2011-05-20 ENCOUNTER — Encounter: Payer: Self-pay | Admitting: Internal Medicine

## 2011-05-20 DIAGNOSIS — N390 Urinary tract infection, site not specified: Secondary | ICD-10-CM

## 2011-05-20 DIAGNOSIS — M542 Cervicalgia: Secondary | ICD-10-CM | POA: Insufficient documentation

## 2011-05-20 DIAGNOSIS — I10 Essential (primary) hypertension: Secondary | ICD-10-CM

## 2011-05-20 DIAGNOSIS — E119 Type 2 diabetes mellitus without complications: Secondary | ICD-10-CM

## 2011-05-20 HISTORY — DX: Cervicalgia: M54.2

## 2011-05-20 LAB — BASIC METABOLIC PANEL
CO2: 27 mEq/L (ref 19–32)
Calcium: 9.3 mg/dL (ref 8.4–10.5)
Creatinine, Ser: 0.6 mg/dL (ref 0.4–1.2)
GFR: 107.82 mL/min (ref >60.00)

## 2011-05-20 LAB — POCT URINALYSIS DIPSTICK
Blood, UA: NEGATIVE
Spec Grav, UA: 1.03
Urobilinogen, UA: 0.2
pH, UA: 5

## 2011-05-20 MED ORDER — CYCLOBENZAPRINE HCL 10 MG PO TABS: 10.0000 mg | ORAL_TABLET | Freq: Two times a day (BID) | ORAL | Status: DC | PRN

## 2011-05-20 MED ORDER — OXYCODONE-ACETAMINOPHEN 5-325 MG PO TABS: 1.0000 | ORAL_TABLET | Freq: Four times a day (QID) | ORAL | Status: DC | PRN

## 2011-05-20 MED ORDER — KETOROLAC TROMETHAMINE 10 MG PO TABS: 10.0000 mg | ORAL_TABLET | Freq: Four times a day (QID) | ORAL | Status: AC | PRN

## 2011-05-20 NOTE — Assessment & Plan Note (Addendum)
New problem Neck pain , some radiation to L trapezoid, given chronicity will refer to ortho Plan: Ortho toradol-flexeril- -heat pad Prefers percocet which  helps w/o making her sleepy

## 2011-05-20 NOTE — Assessment & Plan Note (Addendum)
Few months h/o dysuria, ?vag d/c Needs a new gyn, urged to get an appointment

## 2011-05-20 NOTE — Assessment & Plan Note (Signed)
Urged pt to see endocrinology New glucometer provided  CBG today 181 (not fasting)

## 2011-05-20 NOTE — Patient Instructions (Signed)
Came back for a physical at your convenience See your gynecologist and endocrinologist !!

## 2011-05-20 NOTE — Assessment & Plan Note (Signed)
bp well controlled. 

## 2011-05-20 NOTE — Progress Notes (Signed)
  Subjective:    Patient ID: Alexandra Parsons, female    DOB: 01-Apr-1961, 50 y.o.   MRN: [id number]  HPI Neck pain on and off for several months, 10-2010 went to a chiropractor which helped some. Pain is located in the posterior aspect of the left neck with some radiation to the left trapezoid, worse when she moved her neck particularly when she lifts the head after hyperflexion. No radiation, no help with Motrin or Ultram. 2 vicodin do help but they cause drowsiness.  Also complained of dysuria for months, wonders if she has a UTI. Unfortunately she has been very busy and has not been able to seek medical care. Her husband is on hospice due to cancer.  She has diabetes, has not seen her endocrinologist in a while, lost her glucometer.  Past Medical History  Diagnosis Date  . Hypertension   . Depression   . Diabetes mellitus     type 2  . Menopausal syndrome   . Dyslipidemia   . Abnormal Pap smear of cervix     h/o HPV  . Sarcoid     post partum in remission   Past Surgical History  Procedure Date  . Tubal ligation   . Pilondial cyst removal   . Abdominal plasty remotely     Review of Systems Denies fever or chills No nausea vomiting or diarrhea. No bladder or bowel incontinence No arm or leg tingling or paresthesias. No blood in the urine or vaginal bleeding, some vaginal discharge?.     Objective:   Physical Exam  Constitutional: She appears well-developed. No distress.  HENT:  Head: Normocephalic and atraumatic.  Neck:       Not tender to palpation in the cervical spine. Not tender at the trapezoid area. Neck range of motion is in general limited by pain particularly when she tries to hyperextend   Cardiovascular: Normal rate, regular rhythm and normal heart sounds.   No murmur heard. Pulmonary/Chest: Effort normal and breath sounds normal. No respiratory distress. She has no wheezes. She has no rales.  Abdominal: Soft. She exhibits no distension. There is no  tenderness. There is no rebound.       No CVA tenderness  Musculoskeletal: She exhibits no edema.  Neurological:       Motor, gait, DTRs symmetrical  Skin: She is not diaphoretic.          Assessment & Plan:

## 2011-05-22 LAB — CULTURE, URINE COMPREHENSIVE: Colony Count: NO GROWTH

## 2011-05-25 ENCOUNTER — Ambulatory Visit: Payer: BC Managed Care – PPO | Admitting: Endocrinology

## 2011-07-14 ENCOUNTER — Encounter: Payer: Self-pay | Admitting: Internal Medicine

## 2011-07-14 ENCOUNTER — Ambulatory Visit (INDEPENDENT_AMBULATORY_CARE_PROVIDER_SITE_OTHER): Payer: BC Managed Care – PPO | Admitting: Internal Medicine

## 2011-07-14 VITALS — BP 120/82 | HR 93 | Temp 98.6°F | Wt 220.6 lb

## 2011-07-14 DIAGNOSIS — I10 Essential (primary) hypertension: Secondary | ICD-10-CM

## 2011-07-14 DIAGNOSIS — E119 Type 2 diabetes mellitus without complications: Secondary | ICD-10-CM

## 2011-07-14 DIAGNOSIS — J40 Bronchitis, not specified as acute or chronic: Secondary | ICD-10-CM

## 2011-07-14 MED ORDER — ESCITALOPRAM OXALATE 10 MG PO TABS: 10.0000 mg | ORAL_TABLET | Freq: Every day | ORAL | Status: DC

## 2011-07-14 MED ORDER — AZITHROMYCIN 250 MG PO TABS: ORAL_TABLET | ORAL | Status: AC

## 2011-07-14 MED ORDER — METFORMIN HCL 500 MG PO TABS: 500.0000 mg | ORAL_TABLET | Freq: Two times a day (BID) | ORAL | Status: DC

## 2011-07-14 MED ORDER — BISOPROLOL-HYDROCHLOROTHIAZIDE 2.5-6.25 MG PO TABS: 1.0000 | ORAL_TABLET | Freq: Every day | ORAL | Status: DC

## 2011-07-14 NOTE — Patient Instructions (Signed)
Rest, fluids , tylenol For cough, take Mucinex DM twice a day as needed  Take the antibiotic as prescribed ----> zpack, you have a RF in case you need it  Call if no better in few days Call anytime if the symptoms are severe Came back in 1 month for a yearly check up

## 2011-07-14 NOTE — Progress Notes (Signed)
  Subjective:    Patient ID: Alexandra Parsons, female    DOB: 05-01-1961, 50 y.o.   MRN: [id number]  HPI Sick for 3 weeks, cough, abundant sputum production initially dark now more light-colored.  Past Medical History  Diagnosis Date  . Hypertension   . Depression   . Diabetes mellitus     type 2  . Menopausal syndrome   . Dyslipidemia   . Abnormal Pap smear of cervix     h/o HPV  . Sarcoid     post partum in remission   Past Surgical History  Procedure Date  . Tubal ligation   . Pilondial cyst removal   . Abdominal plasty remotely      Review of Systems Had fever with the onset of symptoms only. Mild nausea, no vomiting. Myalgias at baseline. Sinus congestion at baseline Good medication compliance with all chronic medicines, BP when checked under good control. Blood sugar better in the last few months, according to the patient due to a better diet.     Objective:   Physical Exam  Constitutional: She is oriented to person, place, and time. She appears well-developed and well-nourished.  HENT:  Head: Normocephalic and atraumatic.  Right Ear: External ear normal.  Left Ear: External ear normal.  Mouth/Throat: No oropharyngeal exudate.       Nose slightly congested  Cardiovascular: Normal rate, regular rhythm and normal heart sounds.   No murmur heard. Pulmonary/Chest: Effort normal and breath sounds normal. No respiratory distress. She has no wheezes. She has no rales.  Musculoskeletal: She exhibits no edema.  Neurological: She is alert and oriented to person, place, and time.       Assessment & Plan:  Bronchitis: Symptoms consistent with bronchitis, exam benign. Will treat with antibiotics. Patient reports that he usually takes 2 rounds of  Z-Paks to help her. See instructions.  Today , I spent more than 25 min with the patient, >50% of the time counseling regards compliance with f/u for DM-HTN

## 2011-07-14 NOTE — Assessment & Plan Note (Addendum)
Poor compliance with followups. The patient requested samples and a prescription for chronic medicines, states she will come back in one month for a general checkup and blood work. I told the patient that I like to help her manage her diabetes and high blood pressure but she is not letting me do that (or Dr. Everardo All who she saw before ) She declined to have labs today due to cost In good faith I provided samples and Rx for 1 months. No further RF w/o OV Depending on how she is doing, I may need to re-refer her to endocrinology

## 2011-07-14 NOTE — Assessment & Plan Note (Signed)
See diabetes

## 2011-08-11 LAB — HM COLONOSCOPY

## 2011-08-28 ENCOUNTER — Encounter: Payer: BC Managed Care – PPO | Admitting: Internal Medicine

## 2011-09-18 ENCOUNTER — Other Ambulatory Visit: Payer: Self-pay | Admitting: Internal Medicine

## 2011-09-18 ENCOUNTER — Ambulatory Visit (INDEPENDENT_AMBULATORY_CARE_PROVIDER_SITE_OTHER): Payer: BC Managed Care – PPO | Admitting: Internal Medicine

## 2011-09-18 ENCOUNTER — Encounter: Payer: Self-pay | Admitting: Internal Medicine

## 2011-09-18 DIAGNOSIS — Z0000 Encounter for general adult medical examination without abnormal findings: Secondary | ICD-10-CM

## 2011-09-18 DIAGNOSIS — E785 Hyperlipidemia, unspecified: Secondary | ICD-10-CM

## 2011-09-18 DIAGNOSIS — F3289 Other specified depressive episodes: Secondary | ICD-10-CM

## 2011-09-18 DIAGNOSIS — E119 Type 2 diabetes mellitus without complications: Secondary | ICD-10-CM

## 2011-09-18 DIAGNOSIS — I10 Essential (primary) hypertension: Secondary | ICD-10-CM

## 2011-09-18 DIAGNOSIS — F329 Major depressive disorder, single episode, unspecified: Secondary | ICD-10-CM

## 2011-09-18 MED ORDER — ESCITALOPRAM OXALATE 20 MG PO TABS: 20.0000 mg | ORAL_TABLET | Freq: Every day | ORAL | Status: DC

## 2011-09-18 MED ORDER — SITAGLIPTIN PHOS-METFORMIN HCL 50-1000 MG PO TABS: 1.0000 | ORAL_TABLET | Freq: Two times a day (BID) | ORAL | Status: DC

## 2011-09-18 NOTE — Assessment & Plan Note (Signed)
No change 

## 2011-09-18 NOTE — Assessment & Plan Note (Addendum)
Td 2008 Had a flu shot  03-2009  Cscope neg, next in 10 years   Saw gyn few weeks ago, had a  PAP-breast exam MMG pending , strongly recommend to schedule it  Discussed tobacco risk, info about quitting provided , she does see her dentist routinely  Diet-exercise discussed

## 2011-09-18 NOTE — Assessment & Plan Note (Signed)
good medication compliance Labs  

## 2011-09-18 NOTE — Assessment & Plan Note (Addendum)
Poor compliance, mostly because pill burden. Plan: Combine metformin and [place], labs

## 2011-09-18 NOTE — Progress Notes (Signed)
  Subjective:    Patient ID: Alexandra Parsons, female    DOB: 1961-06-01, 51 y.o.   MRN: [id number]  HPI Here for a CPX. Chronic medical problems: Diabetes, fast CBGs119 - 149. She ran out of metformin, also often times she forgets because it is "too many pills" Hypertension, she checks her blood pressure rarely but is usually within normal. High cholesterol, good medication compliance, no apparent side effects. Depression, on Lexapro 10 mg, symptoms are not well controlled, denies any suicidal ideas. Also complaining of poor concentration, in the past she took adderall , refill?  Past Medical History: Diabetes mellitus, type II Depression Hypertension hyperlipidemia Menopausal  PAP SMEAR, ABNORMAL , h/o HPV. Repeated PAP neg  Sarcoid-post partum- on  Remission  Past Surgical History: Tubal ligation pilonidal cyst removal abdominal plasty remotely   Family History: Cervical ca-- no, sister has abnormal PAPs Colon ca-- GGM x2 (one die at 52 y/o) breast ca--no DM-- HTN-- GM CAD-- GF age 47  CVA-- GF?  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 No chest pain, shortness of breath or palpitations. No lower extremity edema. Occasional cough. No nausea, vomiting, diarrhea. She still smokes half pack a day.     Objective:   Physical Exam  Constitutional: She is oriented to person, place, and time. She appears well-developed and well-nourished. No distress.  HENT:  Head: Normocephalic and atraumatic.  Neck: No thyromegaly present.  Cardiovascular: Normal rate, regular rhythm and normal heart sounds.   No murmur heard. Pulmonary/Chest: Effort normal and breath sounds normal. No respiratory distress. She has no  wheezes. She has no rales.  Abdominal: Soft. Bowel sounds are normal. She exhibits no distension. There is no tenderness. There is no rebound and no guarding.  Musculoskeletal:       DIABETIC FEET EXAM: No lower extremity edema Normal pedal pulses bilaterally Skin and nails are normal without calluses Pinprick examination of the feet normal.   Neurological: She is alert and oriented to person, place, and time.  Skin: She is not diaphoretic.  Psychiatric: She has a normal mood and affect. Her behavior is normal. Judgment and thought content normal.      Assessment & Plan:

## 2011-09-18 NOTE — Assessment & Plan Note (Signed)
Not well controlled, increase Lexapro from 10 mg to 20 mg. Wonders if she needs adderall again, we'll reassess in 2 months, after depression is better.

## 2011-09-18 NOTE — Patient Instructions (Signed)
Come back fasting: A1C,  microalbumin--- dx DM FLP-- hyperlipidemia CMP CBC TSH-- v 70

## 2011-09-21 ENCOUNTER — Other Ambulatory Visit (INDEPENDENT_AMBULATORY_CARE_PROVIDER_SITE_OTHER): Payer: BC Managed Care – PPO

## 2011-09-21 DIAGNOSIS — Z0000 Encounter for general adult medical examination without abnormal findings: Secondary | ICD-10-CM

## 2011-09-21 DIAGNOSIS — E785 Hyperlipidemia, unspecified: Secondary | ICD-10-CM

## 2011-09-21 DIAGNOSIS — E119 Type 2 diabetes mellitus without complications: Secondary | ICD-10-CM

## 2011-09-21 LAB — COMPREHENSIVE METABOLIC PANEL
Alkaline Phosphatase: 76 U/L (ref 39–117)
CO2: 27 mEq/L (ref 19–32)
Creatinine, Ser: 0.7 mg/dL (ref 0.4–1.2)
GFR: 89.18 mL/min (ref >60.00)
Glucose, Bld: 264 mg/dL — ABNORMAL HIGH (ref 70–99)
Total Bilirubin: 0.8 mg/dL (ref 0.3–1.2)

## 2011-09-21 LAB — CBC WITH DIFFERENTIAL/PLATELET
Basophils Relative: 0.3 % (ref 0.0–3.0)
Eosinophils Relative: 1.3 % (ref 0.0–5.0)
HCT: 42.5 % (ref 36.0–46.0)
Hemoglobin: 14.6 g/dL (ref 12.0–15.0)
Lymphs Abs: 3.3 10*3/uL (ref 0.7–4.0)
MCV: 94.4 fl (ref 78.0–100.0)
Monocytes Absolute: 0.6 10*3/uL (ref 0.1–1.0)
Monocytes Relative: 5.1 % (ref 3.0–12.0)
Neutro Abs: 7 10*3/uL (ref 1.4–7.7)
RBC: 4.5 Mil/uL (ref 3.87–5.11)
WBC: 11 10*3/uL — ABNORMAL HIGH (ref 4.5–10.5)

## 2011-09-21 LAB — MICROALBUMIN / CREATININE URINE RATIO
Creatinine,U: 336.6 mg/dL
Microalb Creat Ratio: 0.9 mg/g (ref 0.0–30.0)

## 2011-09-21 LAB — LIPID PANEL
HDL: 23.7 mg/dL — ABNORMAL LOW (ref >39.00)
Triglycerides: 184 mg/dL — ABNORMAL HIGH (ref 0.0–149.0)
VLDL: 36.8 mg/dL (ref 0.0–40.0)

## 2011-09-21 LAB — TSH: TSH: 1.51 u[IU]/mL (ref 0.35–5.50)

## 2011-09-26 ENCOUNTER — Telehealth: Payer: Self-pay | Admitting: Internal Medicine

## 2011-09-26 NOTE — Telephone Encounter (Signed)
(  elevated LFTs is a chronic issue, neg chronic  hepatitis panel 2008)  Advise patient: A1C 10.8 !, Cholesterol slt elevated except for high TG, WBC and LFTs slt elevated Plan: Good  Compliance w/ DM meds  RTC in 2 months as planned

## 2011-09-28 ENCOUNTER — Telehealth: Payer: Self-pay | Admitting: Internal Medicine

## 2011-09-28 MED ORDER — SIMVASTATIN 20 MG PO TABS: 20.0000 mg | ORAL_TABLET | Freq: Every day | ORAL | Status: DC

## 2011-09-28 MED ORDER — PIOGLITAZONE HCL 30 MG PO TABS: 30.0000 mg | ORAL_TABLET | Freq: Every day | ORAL | Status: DC

## 2011-09-28 NOTE — Telephone Encounter (Signed)
Refill done.  

## 2011-09-28 NOTE — Telephone Encounter (Signed)
Refill: Actos 30mg , qty 30  Refill: Simvastatin 20mg , qty 30  Pharmacy comments: Expired and is transferring to our pharmacy. Can we have a new RX?

## 2011-09-28 NOTE — Telephone Encounter (Signed)
LMOVM for pt to return call 

## 2011-10-05 ENCOUNTER — Encounter: Payer: Self-pay | Admitting: *Deleted

## 2011-10-05 NOTE — Telephone Encounter (Signed)
Sent letter & labs.

## 2011-10-05 NOTE — Telephone Encounter (Signed)
Call again or send a letter.

## 2011-11-19 ENCOUNTER — Ambulatory Visit (INDEPENDENT_AMBULATORY_CARE_PROVIDER_SITE_OTHER): Payer: BC Managed Care – PPO | Admitting: Internal Medicine

## 2011-11-19 ENCOUNTER — Other Ambulatory Visit: Payer: Self-pay | Admitting: Internal Medicine

## 2011-11-19 VITALS — BP 122/80 | HR 63 | Temp 98.0°F | Wt 213.0 lb

## 2011-11-19 DIAGNOSIS — D72829 Elevated white blood cell count, unspecified: Secondary | ICD-10-CM

## 2011-11-19 DIAGNOSIS — M542 Cervicalgia: Secondary | ICD-10-CM

## 2011-11-19 DIAGNOSIS — E119 Type 2 diabetes mellitus without complications: Secondary | ICD-10-CM

## 2011-11-19 DIAGNOSIS — F3289 Other specified depressive episodes: Secondary | ICD-10-CM

## 2011-11-19 DIAGNOSIS — F909 Attention-deficit hyperactivity disorder, unspecified type: Secondary | ICD-10-CM

## 2011-11-19 DIAGNOSIS — R7989 Other specified abnormal findings of blood chemistry: Secondary | ICD-10-CM

## 2011-11-19 DIAGNOSIS — F329 Major depressive disorder, single episode, unspecified: Secondary | ICD-10-CM

## 2011-11-19 HISTORY — DX: Elevated white blood cell count, unspecified: D72.829

## 2011-11-19 HISTORY — DX: Other specified abnormal findings of blood chemistry: R79.89

## 2011-11-19 LAB — ALT: ALT: 44 U/L — ABNORMAL HIGH (ref 0–35)

## 2011-11-19 LAB — AST: AST: 38 U/L — ABNORMAL HIGH (ref 0–37)

## 2011-11-19 LAB — GLUCOSE, POCT (MANUAL RESULT ENTRY): POC Glucose: 135

## 2011-11-19 MED ORDER — OXYCODONE-ACETAMINOPHEN 5-325 MG PO TABS: 1.0000 | ORAL_TABLET | Freq: Two times a day (BID) | ORAL | Status: DC | PRN

## 2011-11-19 NOTE — Assessment & Plan Note (Signed)
Chronic mild elevation, recheck in few months, hematology referral if needed

## 2011-11-19 NOTE — Assessment & Plan Note (Signed)
Recheck labs , at some point will needs further w/u

## 2011-11-19 NOTE — Progress Notes (Signed)
  Subjective:    Patient ID: Alexandra Parsons, female    DOB: [birth year], 51 y.o.   MRN: [id number]  HPI Followup from previous visit. Today we discussed the following issues (see assessment and plan): Diabetes, ADHD, increased LFTs, increased WBCs, neck pain  Past Medical History: Diabetes mellitus, type II Depression Hypertension hyperlipidemia Menopausal   PAP SMEAR, ABNORMAL , h/o HPV. Repeated PAP neg   Sarcoid-post partum- on  Remission  Past Surgical History: Tubal ligation pilonidal cyst removal abdominal plasty remotely     Review of Systems Good compliance with  medicines, diet has definitely improved. She is exercising 3 or 4 times a week. Ambulatory blood sugars 118 to 133 in the morning. She did see the orthopedic doctor, records not available, was diagnosed with neck DJD. She was prescribed a small amount of pain medications but they're not going to continue prescribing it. Request a prescription from me.    Objective:   Physical Exam  A, ox3, nad       Assessment & Plan:  Today , I spent more than 15  min with the patient F2F due to the # of issues addressed, see a/p

## 2011-11-19 NOTE — Assessment & Plan Note (Signed)
We increased Lexapro from 10-20 mg, not a major difference. No change for now.

## 2011-11-19 NOTE — Telephone Encounter (Signed)
Refill done.  

## 2011-11-19 NOTE — Assessment & Plan Note (Signed)
We increased the dose of Lexapro, that has not helped with her lack of focus. On further questioning, she has a family history (father, daughter),  she had severe ADHD symptoms in college, later in life symptoms sx moderated. At some point adderall  helped. She never had any formal psychological evaluation. At this point sx are mild-moderate, thinks they are exacerbated by stress  Plan: recommend to see a psychologist for ADHD confirnmation

## 2011-11-19 NOTE — Assessment & Plan Note (Signed)
Continue with neck pain, Flexeril did not help much. She was recently seen by orthopedic surgery, diagnosed with arthritis. Patient request percocet, has taken it before w/ ggood results, on average 1 a day vicodin does not help as much

## 2011-11-19 NOTE — Assessment & Plan Note (Signed)
improved her diet, exercising 3-4 times a week. Good medication compliance. Labs.

## 2011-11-19 NOTE — Patient Instructions (Signed)
Please get a formal evaluation for ADHD @  [facility]

## 2011-11-20 ENCOUNTER — Encounter: Payer: Self-pay | Admitting: Internal Medicine

## 2011-11-25 ENCOUNTER — Encounter: Payer: Self-pay | Admitting: *Deleted

## 2012-03-02 ENCOUNTER — Ambulatory Visit: Payer: BC Managed Care – PPO | Admitting: Internal Medicine

## 2012-04-13 ENCOUNTER — Ambulatory Visit (INDEPENDENT_AMBULATORY_CARE_PROVIDER_SITE_OTHER): Payer: BC Managed Care – PPO | Admitting: Internal Medicine

## 2012-04-13 ENCOUNTER — Encounter: Payer: Self-pay | Admitting: Internal Medicine

## 2012-04-13 VITALS — BP 104/66 | HR 70 | Temp 98.4°F | Wt 211.0 lb

## 2012-04-13 DIAGNOSIS — M542 Cervicalgia: Secondary | ICD-10-CM

## 2012-04-13 DIAGNOSIS — F329 Major depressive disorder, single episode, unspecified: Secondary | ICD-10-CM

## 2012-04-13 DIAGNOSIS — E785 Hyperlipidemia, unspecified: Secondary | ICD-10-CM

## 2012-04-13 DIAGNOSIS — E119 Type 2 diabetes mellitus without complications: Secondary | ICD-10-CM

## 2012-04-13 DIAGNOSIS — I10 Essential (primary) hypertension: Secondary | ICD-10-CM

## 2012-04-13 DIAGNOSIS — F909 Attention-deficit hyperactivity disorder, unspecified type: Secondary | ICD-10-CM

## 2012-04-13 DIAGNOSIS — F3289 Other specified depressive episodes: Secondary | ICD-10-CM

## 2012-04-13 LAB — COMPREHENSIVE METABOLIC PANEL
ALT: 50 U/L — ABNORMAL HIGH (ref 0–35)
Alkaline Phosphatase: 65 U/L (ref 39–117)
CO2: 26 mEq/L (ref 19–32)
Sodium: 135 mEq/L (ref 135–145)
Total Bilirubin: 0.5 mg/dL (ref 0.3–1.2)
Total Protein: 7.9 g/dL (ref 6.0–8.3)

## 2012-04-13 LAB — LIPID PANEL
HDL: 25.3 mg/dL — ABNORMAL LOW (ref >39.00)
Total CHOL/HDL Ratio: 6
VLDL: 27.6 mg/dL (ref 0.0–40.0)

## 2012-04-13 MED ORDER — OXYCODONE-ACETAMINOPHEN 5-325 MG PO TABS: 1.0000 | ORAL_TABLET | Freq: Two times a day (BID) | ORAL | Status: DC | PRN

## 2012-04-13 MED ORDER — CYCLOBENZAPRINE HCL 10 MG PO TABS: 10.0000 mg | ORAL_TABLET | Freq: Two times a day (BID) | ORAL | Status: DC | PRN

## 2012-04-13 NOTE — Progress Notes (Signed)
  Subjective:    Patient ID: Alexandra Parsons, female    DOB: 09-Jul-1961, 51 y.o.   MRN: [id number]  HPI Routine office visit Diabetes, good compliance with medicines, ambulatory blood sugars around 120 morning. Depression, good compliance with Lexapro, see assessment and plan Question of ADHD, did not see psychology. Hypertension, good medication compliance, ambulatory BPs usually 120/80. BP today 104/66, she feels well. Neck pain, request a refill on oxycodone, she uses it when necessary.  Past Medical History: Diabetes mellitus, type II Depression Hypertension hyperlipidemia Menopausal   PAP SMEAR, ABNORMAL , h/o HPV. Repeated PAP neg   Sarcoid-post partum- on  Remission Elevated LFTs Mild leukocytosis  Past Surgical History: Tubal ligation pilonidal cyst removal abdominal plasty remotely     Review of Systems No nausea, vomiting, diarrhea. Diet has not been as good lately. Exercise, she is very active at work, going to the gym twice a week and planning to go more often. Denies any vision trouble except needing reading glasses. No lower extremity paresthesias.     Objective:   Physical Exam General -- alert, well-developed, and overweight appearing. No apparent distress.  DIABETIC FEET EXAM: No lower extremity edema Normal pedal pulses bilaterally Skin and nails are normal without calluses Pinprick examination of the feet normal. Neurologic-- alert & oriented X3 and strength normal in all extremities. Psych-- Cognition and judgment appear intact. Alert and cooperative with normal attention span and concentration.  not anxious appearing and not depressed appearing.        Assessment & Plan:

## 2012-04-13 NOTE — Assessment & Plan Note (Signed)
Feet exam normal Good medication compliance Encouraged to be more active, encourage a better diet. Labs

## 2012-04-13 NOTE — Assessment & Plan Note (Signed)
Requests a refill on Percocet, she uses it as needed. #40, no refill prescribed. I also refill Flexeril to be use when necessary.

## 2012-04-13 NOTE — Assessment & Plan Note (Signed)
Normal ambulatory BPs, no change. Check a BMP

## 2012-04-13 NOTE — Assessment & Plan Note (Addendum)
Symptoms are stable, they are not 100% controlled but she declines to change medication or add Wellbutrin which she couldn't tolerate in the past. Reports that overall Lexapro has helped a great deal. Plan: No change

## 2012-04-13 NOTE — Assessment & Plan Note (Signed)
Compliance with Zocor is very good, not taking fish oil. Labs

## 2012-04-13 NOTE — Assessment & Plan Note (Signed)
Has not seen psychology for confirmation of ADHD, not interested in treatment at this point

## 2012-04-15 MED ORDER — GLIMEPIRIDE 4 MG PO TABS: 4.0000 mg | ORAL_TABLET | Freq: Every day | ORAL | Status: DC

## 2012-04-15 NOTE — Addendum Note (Signed)
Addended by: Edwena Felty T on: 04/15/2012 02:37 PM   Modules accepted: Orders

## 2012-07-15 ENCOUNTER — Encounter: Payer: Self-pay | Admitting: Internal Medicine

## 2012-07-15 ENCOUNTER — Ambulatory Visit (INDEPENDENT_AMBULATORY_CARE_PROVIDER_SITE_OTHER): Payer: BC Managed Care – PPO | Admitting: Internal Medicine

## 2012-07-15 VITALS — BP 126/72 | HR 78 | Temp 98.3°F | Wt 216.0 lb

## 2012-07-15 DIAGNOSIS — J329 Chronic sinusitis, unspecified: Secondary | ICD-10-CM

## 2012-07-15 DIAGNOSIS — F329 Major depressive disorder, single episode, unspecified: Secondary | ICD-10-CM

## 2012-07-15 DIAGNOSIS — M542 Cervicalgia: Secondary | ICD-10-CM

## 2012-07-15 DIAGNOSIS — D72829 Elevated white blood cell count, unspecified: Secondary | ICD-10-CM

## 2012-07-15 DIAGNOSIS — E119 Type 2 diabetes mellitus without complications: Secondary | ICD-10-CM

## 2012-07-15 LAB — CBC WITH DIFFERENTIAL/PLATELET
Basophils Absolute: 0.1 10*3/uL (ref 0.0–0.1)
Eosinophils Absolute: 0.3 10*3/uL (ref 0.0–0.7)
Hemoglobin: 14.7 g/dL (ref 12.0–15.0)
Lymphocytes Relative: 26.8 % (ref 12.0–46.0)
Lymphs Abs: 3.2 10*3/uL (ref 0.7–4.0)
MCHC: 33.9 g/dL (ref 30.0–36.0)
Neutro Abs: 7.6 10*3/uL (ref 1.4–7.7)
RDW: 13.9 % (ref 11.5–14.6)

## 2012-07-15 LAB — HEMOGLOBIN A1C: Hgb A1c MFr Bld: 7.9 % — ABNORMAL HIGH (ref 4.6–6.5)

## 2012-07-15 LAB — ALT: ALT: 55 U/L — ABNORMAL HIGH (ref 0–35)

## 2012-07-15 MED ORDER — OXYCODONE-ACETAMINOPHEN 5-325 MG PO TABS: 1.0000 | ORAL_TABLET | Freq: Two times a day (BID) | ORAL | Status: AC | PRN

## 2012-07-15 MED ORDER — AMOXICILLIN 500 MG PO CAPS: 1000.0000 mg | ORAL_CAPSULE | Freq: Two times a day (BID) | ORAL | Status: DC

## 2012-07-15 MED ORDER — FLUTICASONE PROPIONATE 50 MCG/ACT NA SUSP: 2.0000 | Freq: Every day | NASAL | Status: DC

## 2012-07-15 NOTE — Assessment & Plan Note (Addendum)
Requests a refill on Percocet, one of which uses once a day. Neck pain is becoming a chronic issue. Will prescribe #40 month for now .

## 2012-07-15 NOTE — Assessment & Plan Note (Signed)
Recheck a CBC, if WBCs still elevated, consider hematology referral

## 2012-07-15 NOTE — Progress Notes (Signed)
  Subjective:    Patient ID: Alexandra Parsons, female    DOB: [birth year], 51 y.o.   MRN: [id number]  HPI Routine visit Diabetes, started to have low sugars as low as 56 (with symptoms). Self decreased dose of glimepiride and Actos, see assessment and plan Hypertension, good medication compliance, normal ambulatory BPs Respiratory symptoms, reports a history of chronic sinusitis (chronic mild sinus discharge frontal headaches) but in the last 2 weeks the amount of nasal discharge has increased and is green in color. She also has chronic daily cough with a very small amount of green sputum. She continued to smoke.   Past Medical History  Diagnosis Date  . Hypertension   . Depression   . Diabetes mellitus     type 2  . Menopausal syndrome   . Dyslipidemia   . Abnormal Pap smear of cervix     h/o HPV  . Sarcoid     post partum in remission  . Neck pain 05/20/2011  . Chronic sinusitis, chronic cough (?COPD) 07/16/2009  . Leukocytosis 11/19/2011  . Elevated LFTs 11/19/2011    Past Surgical History  Procedure Date  . Tubal ligation   . Pilondial cyst removal   . Abdominal plasty remotely      Review of Systems No nausea or vomiting. No chest pain or shortness of breath No visual disturbances  No fever chills, weight loss.    Objective:   Physical Exam General -- alert, well-developed, and overweight appearing. No apparent distress.  HEENT -- TMs normal, throat w/o redness, face symmetric and not tender to palpation, nose slt  Congested  Lungs -- normal respiratory effort, no intercostal retractions, no accessory muscle use, and normal breath sounds.  No wheezing  Heart-- normal rate, regular rhythm, no murmur, and no gallop.    Psych-- Cognition and judgment appear intact. Alert and cooperative with normal attention span and concentration.  not anxious appearing and not depressed appearing.       Assessment & Plan:

## 2012-07-15 NOTE — Assessment & Plan Note (Signed)
Doing great with low dose of Lexapro, currently 10 mg

## 2012-07-15 NOTE — Assessment & Plan Note (Addendum)
Since the last time she was here, CBGs dropped to 56, with symptoms. She has decreased: -- glimepiride from 4 mg to 2 mg. --Actos from 30 mg to 15 mg. Plan: Discontinue glimepiride Increase Actos back to 30 Continue janumet Further advise w/ results

## 2012-07-15 NOTE — Assessment & Plan Note (Addendum)
Reports chronic "sinus headaches", saw ENT before, had a scope, was recommended surgery but never followed. Also has chronic cough, she is a smoker, COPD? Today she reports acute exacerbation of chronic nasal discharge. Plan: Antibiotics, tobacco cessation, Flonase, consider PFTs

## 2012-07-17 ENCOUNTER — Encounter: Payer: Self-pay | Admitting: Internal Medicine

## 2012-09-14 ENCOUNTER — Other Ambulatory Visit: Payer: Self-pay | Admitting: Internal Medicine

## 2012-09-14 MED ORDER — BISOPROLOL-HYDROCHLOROTHIAZIDE 2.5-6.25 MG PO TABS: 1.0000 | ORAL_TABLET | Freq: Every day | ORAL | Status: DC

## 2012-09-14 MED ORDER — ESCITALOPRAM OXALATE 20 MG PO TABS: 10.0000 mg | ORAL_TABLET | Freq: Every day | ORAL | Status: DC

## 2012-09-14 NOTE — Telephone Encounter (Signed)
Refill done.  

## 2012-09-14 NOTE — Telephone Encounter (Signed)
refills x 2 Bisoprl/hctz & lexapro  1-Bisoprl-HCTZ (Tab) ZIAC 2.5-6.25 MG TAKE ONE TABLET BY MOUTH EVERY DAY#30 Last fill 7.16.13  2-LEXAPRO 20 MG Take 10 mg by mouth daily.#90 last fill 4.5.12

## 2012-10-20 ENCOUNTER — Encounter: Payer: Self-pay | Admitting: Lab

## 2012-10-21 ENCOUNTER — Ambulatory Visit (INDEPENDENT_AMBULATORY_CARE_PROVIDER_SITE_OTHER): Payer: BC Managed Care – PPO | Admitting: Internal Medicine

## 2012-10-21 ENCOUNTER — Encounter: Payer: Self-pay | Admitting: Internal Medicine

## 2012-10-21 VITALS — BP 122/78 | HR 73 | Temp 98.1°F

## 2012-10-21 DIAGNOSIS — F329 Major depressive disorder, single episode, unspecified: Secondary | ICD-10-CM

## 2012-10-21 DIAGNOSIS — J329 Chronic sinusitis, unspecified: Secondary | ICD-10-CM

## 2012-10-21 DIAGNOSIS — F3289 Other specified depressive episodes: Secondary | ICD-10-CM

## 2012-10-21 DIAGNOSIS — R7989 Other specified abnormal findings of blood chemistry: Secondary | ICD-10-CM

## 2012-10-21 DIAGNOSIS — D72829 Elevated white blood cell count, unspecified: Secondary | ICD-10-CM

## 2012-10-21 DIAGNOSIS — M542 Cervicalgia: Secondary | ICD-10-CM

## 2012-10-21 DIAGNOSIS — E119 Type 2 diabetes mellitus without complications: Secondary | ICD-10-CM

## 2012-10-21 LAB — HEMOGLOBIN A1C: Hgb A1c MFr Bld: 8.8 % — ABNORMAL HIGH (ref 4.6–6.5)

## 2012-10-21 LAB — ALT: ALT: 53 U/L — ABNORMAL HIGH (ref 0–35)

## 2012-10-21 MED ORDER — SITAGLIP PHOS-METFORMIN HCL ER 50-1000 MG PO TB24: 2.0000 | ORAL_TABLET | Freq: Every morning | ORAL | Status: DC

## 2012-10-21 MED ORDER — BECLOMETHASONE DIPROPIONATE 80 MCG/ACT NA AERS: 2.0000 | INHALATION_SPRAY | Freq: Every day | NASAL | Status: DC

## 2012-10-21 MED ORDER — OXYCODONE-ACETAMINOPHEN 5-325 MG PO TABS: 1.0000 | ORAL_TABLET | ORAL | Status: DC | PRN

## 2012-10-21 NOTE — Progress Notes (Signed)
  Subjective:    Patient ID: Alexandra Parsons, female    DOB: 04-13-1961, 52 y.o.   MRN: [id number]  HPI Routine office visit Diabetes, good compliance with Actos, taking Janumet 2 tablets daily together, would like to change to the extended release version. Chronic sinusitis, Flonase helped temporarily, she just like to change to a different brand. She knows she needs surgery and plans to do that at some point this year. Neck pain, well-controlled with judicious use of Percocet, needs a refill. Has Flexeril but rarely uses it because it caused some drowsiness. Depression, lost her husband 08-2012, he had cancer, not an unexpected loss. She was obviously quite affected by that but  overall recovering okay and stable.  Past Medical History  Diagnosis Date  . Hypertension   . Depression   . Diabetes mellitus     type 2  . Menopausal syndrome   . Dyslipidemia   . Abnormal Pap smear of cervix     h/o HPV  . Sarcoid     post partum in remission  . Neck pain 05/20/2011  . Chronic sinusitis, chronic cough (?COPD) 07/16/2009  . Leukocytosis 11/19/2011  . Elevated LFTs 11/19/2011   Past Surgical History  Procedure Laterality Date  . Tubal ligation    . Pilondial cyst removal    . Abdominal plasty remotely     History   Social History  . Marital Status: Married    Spouse Name: N/A    Number of Children: 1  . Years of Education: N/A   Occupational History  . RN    Social History Main Topics  . Smoking status: Current Every Day Smoker -- .5 years  . Smokeless tobacco: Never Used     Comment:    . Alcohol Use: No  . Drug Use: No  . Sexually Active: Not on file   Other Topics Concern  . Not on file   Social History Narrative   Lost husband, prostate cancer, 08-2012          Review of Systems Denies suicidal ideas. No chest pain or shortness or breath No nausea, vomiting, diarrhea.    Objective:   Physical Exam General -- alert, well-developed Lungs -- normal respiratory  effort, no intercostal retractions, no accessory muscle use, and normal breath sounds.   Heart-- normal rate, regular rhythm, no murmur, and no gallop.   Extremities-- no pretibial edema bilaterally  Neurologic-- alert & oriented X3 and strength normal in all extremities. Psych-- Cognition and judgment appear intact. Alert and cooperative with normal attention span and concentration.  not anxious appearing and not depressed appearing.      Assessment & Plan:

## 2012-10-21 NOTE — Assessment & Plan Note (Addendum)
Referral to hematology entered

## 2012-10-21 NOTE — Assessment & Plan Note (Signed)
Good compliance w/ meds but likes XR Janumet , will do. Labs

## 2012-10-21 NOTE — Patient Instructions (Signed)
Next visit 3 months for a physical, fasting, please make an appointment

## 2012-10-21 NOTE — Assessment & Plan Note (Addendum)
flonase helped, overall slt better, likes to change nasal steroids  Plan-- Qnasl, see ENT when possible

## 2012-10-21 NOTE — Assessment & Plan Note (Signed)
Close f/u , labs

## 2012-10-21 NOTE — Assessment & Plan Note (Addendum)
RF percocet to be use sporadically  Uses flexeril rarely (make her sleepy)

## 2012-10-21 NOTE — Assessment & Plan Note (Addendum)
Lost husband, also her  Father is  ill, fortunately she is handling things well

## 2012-10-27 ENCOUNTER — Other Ambulatory Visit: Payer: Self-pay | Admitting: Internal Medicine

## 2012-10-27 NOTE — Telephone Encounter (Signed)
Refill done.  

## 2012-11-01 ENCOUNTER — Other Ambulatory Visit: Payer: Self-pay | Admitting: Internal Medicine

## 2012-11-01 NOTE — Telephone Encounter (Signed)
Refill done.  

## 2012-12-01 ENCOUNTER — Other Ambulatory Visit: Payer: Self-pay | Admitting: Medical

## 2012-12-01 DIAGNOSIS — D72829 Elevated white blood cell count, unspecified: Secondary | ICD-10-CM

## 2012-12-02 ENCOUNTER — Ambulatory Visit: Payer: BC Managed Care – PPO

## 2012-12-02 ENCOUNTER — Ambulatory Visit (HOSPITAL_BASED_OUTPATIENT_CLINIC_OR_DEPARTMENT_OTHER): Payer: BC Managed Care – PPO | Admitting: Medical

## 2012-12-02 ENCOUNTER — Other Ambulatory Visit (HOSPITAL_BASED_OUTPATIENT_CLINIC_OR_DEPARTMENT_OTHER): Payer: BC Managed Care – PPO | Admitting: Lab

## 2012-12-02 VITALS — BP 128/61 | HR 78 | Temp 98.0°F | Ht 66.0 in | Wt 213.0 lb

## 2012-12-02 DIAGNOSIS — D72829 Elevated white blood cell count, unspecified: Secondary | ICD-10-CM

## 2012-12-02 LAB — CHCC SATELLITE - SMEAR

## 2012-12-02 LAB — CBC WITH DIFFERENTIAL (CANCER CENTER ONLY)
Eosinophils Absolute: 0.2 10*3/uL (ref 0.0–0.5)
LYMPH%: 25.9 % (ref 14.0–48.0)
MCV: 92 fL (ref 81–101)
MONO#: 0.6 10*3/uL (ref 0.1–0.9)
Platelets: 167 10*3/uL (ref 145–400)
RBC: 4.46 10*6/uL (ref 3.70–5.32)
WBC: 9.5 10*3/uL (ref 3.9–10.0)

## 2012-12-02 NOTE — Progress Notes (Signed)
This office note has been dictated.

## 2012-12-02 NOTE — H&P (Signed)
[facility] NEW PATIENT EVALUATION   Name: Alexandra Parsons Date: 12/02/2012 MRN: [id number] DOB: [birth year]   REFERRING PHYSICIAN: Wanda Plump, MD  REASON FOR REFERRAL: Leukocytosis    HISTORY OF PRESENT ILLNESS: Alexandra Parsons is a 52 y.o. female who is was kindly referred to our office for further evaluation of leukocytosis.  Alexandra Parsons presents with a past medical history of hypertension, diabetes, sarcoidosis.  At the age of 29, chronic sinusitis, and leukocytosis.  It appears, over the past 5 years she has had a slight elevation in her white count.  Over the past 5 years.  Her white count has fluctuated from 12.5, to 11.4, to 11.0 one year ago, and 11.9, 3 months ago.  Her white count is actually within normal limits today at 9.5.  She is a smoker.  She does have diabetes.  She did have sarcoidosis at the age of 24.  She also has a double deviated septum and has chronic sinusitis.  Her differential today is completely normal.  She does not report any type of rashes or palpable lymph nodes.  She's not reporting any fevers, chills, or night sweats.  She's not reporting any unintentional weight loss, or weight gain.  She denies any nausea, vomiting, diarrhea, constipation, chest pain, shortness of breath, or cough.  She denies any abdominal pain.  She denies any lower leg swelling, any obvious, or abnormal bleeding.  She denies any headaches or visual changes.  She has been under a lot of stress. She has had three deaths in the past four months.  Her husband passed away of cancer in 09-07-22.  Her best friend passed away in 11/06/22, as well as her father.  She is on Lexapro and doing quite well.  Of note, she, reports, that her blood sugars are not well controlled.  She continues to followup with Dr. Drue Novel regarding this issue.  PAST MEDICAL HISTORY:  has a past medical history of Hypertension; Depression; Diabetes mellitus; Menopausal syndrome; Dyslipidemia; Abnormal Pap smear of cervix; Sarcoid;  Neck pain (05/20/2011); Chronic sinusitis, chronic cough (?COPD) (07/16/2009); Leukocytosis (11/19/2011); and Elevated LFTs (11/19/2011).    PAST SURGICAL HISTORY: Past Surgical History  Procedure Laterality Date  . Tubal ligation    . Pilondial cyst removal    . Abdominal plasty remotely      Current Outpatient Prescriptions on File Prior to Visit  Medication Sig Dispense Refill  . Beclomethasone Dipropionate (QNASL) 80 MCG/ACT AERS Place 2 puffs into the nose daily.  1 Inhaler  12  . bisoprolol-hydrochlorothiazide (ZIAC) 2.5-6.25 MG per tablet Take 1 tablet by mouth daily.  30 tablet  1  . cyclobenzaprine (FLEXERIL) 10 MG tablet Take 1 tablet (10 mg total) by mouth 2 (two) times daily as needed for muscle spasms.  60 tablet  0  . escitalopram (LEXAPRO) 20 MG tablet Take 0.5 tablets (10 mg total) by mouth daily.  30 tablet  0  . escitalopram (LEXAPRO) 20 MG tablet TAKE ONE TABLET BY MOUTH EVERY DAY  30 tablet  3  . loratadine (CLARITIN) 10 MG tablet Take 10 mg by mouth daily.        Marland Kitchen oxyCODONE-acetaminophen (PERCOCET/ROXICET) 5-325 MG per tablet Take 1 tablet by mouth every 4 (four) hours as needed for pain.  60 tablet  0  . pioglitazone (ACTOS) 30 MG tablet Take 30 mg by mouth daily.       . pioglitazone (ACTOS) 30 MG tablet TAKE ONE TABLET BY MOUTH EVERY DAY  30 tablet  5  . simvastatin (ZOCOR) 20 MG tablet TAKE ONE TABLET BY MOUTH AT BEDTIME  30 tablet  5  . SitaGLIPtin-MetFORMIN HCl (JANUMET XR) 50-1000 MG TB24 Take 2 tablets by mouth every morning.  180 tablet  2   No current facility-administered medications on file prior to visit.    ALLERGIES: Erythromycin; Fluoxetine hcl; and Venlafaxine   SOCIAL HISTORY:  reports that she has been smoking.  She has never used smokeless tobacco. She reports that she does not drink alcohol or use illicit drugs. 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 She is a widow and lives alone.   FAMILY HISTORY: family history  includes Cancer in her other and Hypertension in her other.   LABORATORY DATA:  Results for orders placed in visit on 12/02/12 (from the past 48 hour(s))  CBC WITH DIFFERENTIAL ([facility])     Status: None   Collection Time    12/02/12  8:23 AM      Result Value Range   WBC 9.5  3.9 - 10.0 10e3/uL   RBC 4.46  3.70 - 5.32 10e6/uL   HGB 14.1  11.6 - 15.9 g/dL   HCT 16.1  09.6 - 04.5 %   MCV 92  81 - 101 fL   MCH 31.6  26.0 - 34.0 pg   MCHC 34.4  32.0 - 36.0 g/dL   RDW 40.9  81.1 - 91.4 %   Platelets 167  145 - 400 10e3/uL   NEUT# 6.2  1.5 - 6.5 10e3/uL   LYMPH# 2.5  0.9 - 3.3 10e3/uL   MONO# 0.6  0.1 - 0.9 10e3/uL   Eosinophils Absolute 0.2  0.0 - 0.5 10e3/uL   BASO# 0.0  0.0 - 0.2 10e3/uL   NEUT% 65.9  39.6 - 80.0 %   LYMPH% 25.9  14.0 - 48.0 %   MONO% 6.1  0.0 - 13.0 %   EOS% 1.8  0.0 - 7.0 %   BASO% 0.3  0.0 - 2.0 %  [facility] - SMEAR     Status: None   Collection Time    12/02/12  8:23 AM      Result Value Range   Smear Result Smear Available       Peripheral smear  shows a well defined population of red blood cells. There is no  polychromasia. I do not see any nucleated red blood cells. There are  no schistocytes. She had no rouleaux formation. I saw no target cells.  There are no sickle cells. Her white cells appear normal in morphology  and maturation. She has no hypersegmented polys. There are no immature  myeloid or lymphoid forms.  I do not see any blasts. Platelets were well-  Granulated and normal size.   Review of Systems: Constitutional:Negative for malaise/fatigue, fever, chills, weight loss, diaphoresis, activity change, appetite change, and unexpected weight change.  HEENT: Negative for double vision, blurred vision, visual loss, ear pain, tinnitus, congestion, rhinorrhea, epistaxis sore throat or sinus disease, oral pain/lesion, tongue soreness Respiratory: Negative for cough, chest tightness, shortness of breath, wheezing and stridor.   Cardiovascular: Negative for chest pain, palpitations, leg swelling, orthopnea, PND, DOE or claudication Gastrointestinal: Negative for nausea, vomiting, abdominal pain, diarrhea, constipation, blood in stool, melena, hematochezia, abdominal distention, anal bleeding, rectal pain, anorexia and hematemesis.  Genitourinary: Negative for dysuria, frequency, hematuria,  Musculoskeletal: Negative for myalgias, back pain, joint swelling, arthralgias and gait problem.  Skin: Negative  for rash, color change, pallor and wound.  Neurological:. Negative for dizziness/light-headedness, tremors, seizures, syncope, facial asymmetry, speech difficulty, weakness, numbness, headaches and paresthesias.  Hematological: Negative for adenopathy. Does not bruise/bleed easily.  Psychiatric/Behavioral:  Negative for depression, no loss of interest in normal activity or change in sleep pattern.   Physical Exam: This is a pleasant, 52 year old, well-developed, well-nourished, white female, in no obvious distress Vitals: Temperature 90.8 degrees, pulse 78, respirations 18, blood pressure 128/61 HEENT reveals a normocephalic, atraumatic skull, no scleral icterus, no oral lesions  Neck is supple without any cervical or supraclavicular adenopathy.  Lungs are clear to auscultation bilaterally. There are no wheezes, rales or rhonci Cardiac is regular rate and rhythm with a normal S1 and S2. There are no murmurs, rubs, or bruits.  Abdomen is soft with good bowel sounds, there is no palpable mass. There is no palpable hepatosplenomegaly. There is no palpable fluid wave.  Musculoskeletal no tenderness of the spine, ribs, or hips.  Extremities there are no clubbing, cyanosis, or edema.  Skin no petechia, purpura or ecchymosis Neurologic is nonfocal.  Assessment/Plan: This is a pleasant, 52 year old, white female, with the following issues:  #1.  Leukocytosis.  Her white count is completely within normal limits today.  Her  differential is normal.  Her verbal smear is completely unremarkable.  There is no indication of any type of leukemia, or lymphoma.  Her history of leukocytosis could be secondary to her smoking.  She does have chronic inflammation, secondary to her deviated septum, and sinusitis.  Her physical exam was completely within normal limits.  She does not have an enlarged spleen.  She denied have any palpable lymph nodes or any type of rashes.  At this point in time, we can go ahead and discharge, her from the practice, however, should there be any other concerns, we will be more than happy to see her back, at that time.  #2.  Diabetes/hypertension/dyslipidemia.  She continues to followup with her primary care physician.  #3.  Depression.  She has had 3 deaths within the past 4 months.  She remains on Lexapro.  #4.  Followup.  We do not need to see Ms. Clonch back unless there should be other concerns.  The above assessment and plan was collaborated with Dr. Myna Hidalgo who is in agreement.

## 2013-01-04 ENCOUNTER — Other Ambulatory Visit: Payer: Self-pay | Admitting: Internal Medicine

## 2013-01-04 NOTE — Telephone Encounter (Signed)
Refill done.  

## 2013-01-20 ENCOUNTER — Ambulatory Visit (INDEPENDENT_AMBULATORY_CARE_PROVIDER_SITE_OTHER): Payer: BC Managed Care – PPO | Admitting: Internal Medicine

## 2013-01-20 ENCOUNTER — Encounter: Payer: Self-pay | Admitting: Lab

## 2013-01-20 ENCOUNTER — Encounter: Payer: Self-pay | Admitting: Internal Medicine

## 2013-01-20 VITALS — BP 114/78 | HR 71 | Temp 98.8°F | Ht 66.5 in | Wt 212.0 lb

## 2013-01-20 DIAGNOSIS — E785 Hyperlipidemia, unspecified: Secondary | ICD-10-CM

## 2013-01-20 DIAGNOSIS — E119 Type 2 diabetes mellitus without complications: Secondary | ICD-10-CM

## 2013-01-20 DIAGNOSIS — Z0000 Encounter for general adult medical examination without abnormal findings: Secondary | ICD-10-CM

## 2013-01-20 DIAGNOSIS — M542 Cervicalgia: Secondary | ICD-10-CM

## 2013-01-20 LAB — LIPID PANEL
Cholesterol: 166 mg/dL (ref 0–200)
HDL: 19.3 mg/dL — ABNORMAL LOW (ref >39.00)
Total CHOL/HDL Ratio: 9
Triglycerides: 163 mg/dL — ABNORMAL HIGH (ref 0.0–149.0)

## 2013-01-20 LAB — COMPREHENSIVE METABOLIC PANEL
ALT: 50 U/L — ABNORMAL HIGH (ref 0–35)
Albumin: 3.7 g/dL (ref 3.5–5.2)
CO2: 24 mEq/L (ref 19–32)
Calcium: 9.5 mg/dL (ref 8.4–10.5)
Chloride: 106 mEq/L (ref 96–112)
GFR: 97.95 mL/min (ref >60.00)
Sodium: 140 mEq/L (ref 135–145)
Total Protein: 7.5 g/dL (ref 6.0–8.3)

## 2013-01-20 MED ORDER — OXYCODONE-ACETAMINOPHEN 5-325 MG PO TABS: 1.0000 | ORAL_TABLET | ORAL | Status: DC | PRN

## 2013-01-20 NOTE — Assessment & Plan Note (Signed)
Run out off Zocor and did not restart. Labs

## 2013-01-20 NOTE — Patient Instructions (Addendum)
See the gynecologist every year. You need to see an eye doctor to be sure you don't have diabetes in your eyes. Continue see a dentist at least every 6 months. Think about Zostavax. Next visit 4 months  Diabetes and Foot Care Diabetes may cause you to have a poor blood supply (circulation) to your legs and feet. Because of this, the skin may be thinner, break easier, and heal more slowly. You also may have nerve damage in your legs and feet causing decreased feeling. You may not notice minor injuries to your feet that could lead to serious problems or infections. Taking care of your feet is one of the most important things you can do for yourself.  HOME CARE INSTRUCTIONS  Do not go barefoot. Bare feet are easily injured.  Check your feet daily for blisters, cuts, and redness.  Wash your feet with warm water (not hot) and mild soap. Pat your feet and between your toes until completely dry.  Apply a moisturizing lotion that does not contain alcohol or petroleum jelly to the dry skin on your feet and to dry brittle toenails. Do not put it between your toes.  Trim your toenails straight across. Do not dig under them or around the cuticle.  Do not cut corns or calluses, or try to remove them with medicine.  Wear clean cotton socks or stockings every day. Make sure they are not too tight. Do not wear knee high stockings since they may decrease blood flow to your legs.  Wear leather shoes that fit properly and have enough cushioning. To break in new shoes, wear them just a few hours a day to avoid injuring your feet.  Wear shoes at all times, even in the house.  Do not cross your legs. This may decrease the blood flow to your feet.  If you find a minor scrape, cut, or break in the skin on your feet, keep it and the skin around it clean and dry. These areas may be cleansed with mild soap and water. Do not use peroxide, alcohol, iodine or Merthiolate.  When you remove an adhesive bandage, be  sure not to harm the skin around it.  If you have a wound, look at it several times a day to make sure it is healing.  Do not use heating pads or hot water bottles. Burns can occur. If you have lost feeling in your feet or legs, you may not know it is happening until it is too late.  Report any cuts, sores or bruises to your caregiver. Do not wait! SEEK MEDICAL CARE IF:   You have an injury that is not healing or you notice redness, numbness, burning, or tingling.  Your feet always feel cold.  You have pain or cramps in your legs and feet. SEEK IMMEDIATE MEDICAL CARE IF:   There is increasing redness, swelling, or increasing pain in the wound.  There is a red line that goes up your leg.  Pus is coming from a wound.  You develop an unexplained oral temperature above 102 F (38.9 C), or as your caregiver suggests.  You notice a bad smell coming from an ulcer or wound. MAKE SURE YOU:   Understand these instructions.  Will watch your condition.  Will get help right away if you are not doing well or get worse. Document Released: 07/24/2000 Document Revised: 10/19/2011 Document Reviewed: 01/30/2009 [facility] Patient Information 2014 [place], [place].

## 2013-01-20 NOTE — Progress Notes (Signed)
  Subjective:    Patient ID: Alexandra Parsons, female    DOB: [birth year], 52 y.o.   MRN: [id number]  HPI And physical exam, we also discussed her chronic medical issues.  Past Medical History  Diagnosis Date  . Hypertension   . Depression   . Diabetes mellitus     type 2  . Menopausal syndrome   . Dyslipidemia   . Abnormal Pap smear of cervix     h/o HPV  . Sarcoid     post partum in remission  . Neck pain 05/20/2011  . Chronic sinusitis, chronic cough (?COPD) 07/16/2009  . Leukocytosis 11/19/2011  . Elevated LFTs 11/19/2011   Past Surgical History  Procedure Laterality Date  . Tubal ligation    . Pilondial cyst removal    . Abdominal plasty remotely     History   Social History  . Marital Status: Married    Spouse Name: N/A    Number of Children: 1  . 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 Smoking status: Current Every Day Smoker -- .5 years  . Smokeless tobacco: Never Used     Comment:    . Alcohol Use: No  . Drug Use: No  . Sexually Active: Not on file   Other Topics Concern  . Not on file   Social History Narrative   Lost husband, prostate cancer, 08-2012         Family History  Problem Relation Age of Onset  . Hypertension Other     GM  . Colon cancer Other     COLON CA GGM x 2  . CAD Other     GF at age 58  . Diabetes Other     aunt  . Stroke Neg Hx       Review of Systems Exercise has improved, exercising daily. Diet, has definitely improved, eating healthy,less carbohydrates. No chest pain or shortness or breath. No cough or sputum production at this point. No  nausea, vomiting, diarrhea. She does have constipation, not help with stool softeners or MiraLax but Alexandra Parsons is helping.    Objective:   Physical Exam BP 114/78  Pulse 71  Temp(Src) 98.8 F (37.1 C) (Oral)  Ht 5' 6.5" (1.689 m)  Wt 212 lb (96.163 kg)  BMI 33.71 kg/m2  SpO2 97%  General -- alert,  well-developed,NAD.   Neck --no thyromegaly , normal carotid pulse Lungs -- normal respiratory effort, no intercostal retractions, no accessory muscle use, and normal breath sounds.   Heart-- normal rate, regular rhythm, no murmur, and no gallop.   Abdomen--soft, non-tender, no distention, no masses, no HSM, no guarding, and no rigidity.   Extremities-- no pretibial edema bilaterally Neurologic-- alert & oriented X3 and strength normal in all extremities. Psych-- Cognition and judgment appear intact. Alert and cooperative with normal attention span and concentration.  not anxious appearing and not depressed appearing.      Assessment & Plan:

## 2013-01-20 NOTE — Assessment & Plan Note (Addendum)
Td 2008 Zostavax discussed 03-2009  Cscope neg, next in 10 years   Due to see gyn encouraged to call them MMG -- not in years, " I can't handle it, too much pain  " , will discuss w/ rads-- u/s? (Addendum: I spoke with radiology today 01-30-13, there is no alternative for mammograms, unfortunately the patient has made her mind that she will not  have a mammogram again.) Discussed tobacco again plans to start a patch , she does see her dentist routinely  Diet-exercise discussed

## 2013-01-20 NOTE — Assessment & Plan Note (Signed)
Last A1c was > 8, declined to see endocrinology, doing better with diet and exercise, currently on Actos 30 half tablet daily, Amaryl 2 mg, good compliance w/ janumet Labs Again recommend ophthalmology exam yearly

## 2013-01-20 NOTE — Assessment & Plan Note (Signed)
Oxycodone when necessary , needs control substance contract agreement and UDS

## 2013-01-21 ENCOUNTER — Encounter: Payer: Self-pay | Admitting: Internal Medicine

## 2013-01-26 ENCOUNTER — Telehealth: Payer: Self-pay | Admitting: *Deleted

## 2013-01-26 DIAGNOSIS — E119 Type 2 diabetes mellitus without complications: Secondary | ICD-10-CM

## 2013-01-26 MED ORDER — SIMVASTATIN 20 MG PO TABS: 20.0000 mg | ORAL_TABLET | Freq: Every day | ORAL | Status: DC

## 2013-01-26 NOTE — Telephone Encounter (Signed)
Discussed with pt, entered referral & sent rx to pharmacy.

## 2013-01-26 NOTE — Telephone Encounter (Signed)
Message copied by Nada Maclachlan on Thu Jan 26, 2013  4:15 PM ------      Message from: Willow Ora E      Created: Thu Jan 26, 2013  1:06 PM       Call the patient:      Diabetes is not well-controlled. Arrange a endocrinology referral.      Cholesterol has increased, recommend to go back to Zocor 20 mg one by mouth each bedtime, please call in a prescription.#90, one refill.      The liver tests slightly elevated but at baseline.      Next visit with me 4 months.       ------

## 2013-02-22 ENCOUNTER — Ambulatory Visit: Payer: BC Managed Care – PPO | Admitting: Endocrinology

## 2013-03-03 ENCOUNTER — Ambulatory Visit (INDEPENDENT_AMBULATORY_CARE_PROVIDER_SITE_OTHER): Payer: BC Managed Care – PPO | Admitting: Endocrinology

## 2013-03-03 ENCOUNTER — Encounter: Payer: Self-pay | Admitting: Endocrinology

## 2013-03-03 VITALS — BP 130/80 | HR 78 | Ht 66.0 in | Wt 206.0 lb

## 2013-03-03 DIAGNOSIS — E119 Type 2 diabetes mellitus without complications: Secondary | ICD-10-CM

## 2013-03-03 NOTE — Progress Notes (Signed)
Subjective:    Patient ID: Alexandra Parsons, female    DOB: 05-22-1961, 52 y.o.   MRN: [id number]  HPI pt states 7 years h/o dm.  (she had gestational dm 25 years ago).  she is unaware of any chronic complications.  he has been on insulin x .  pt says his diet and exercise are both good.  She says cbg's are in the low-100's in am.  She says this is much better than she had prior to last a1c last month.  She does not take amaryl, as she says it makes her shaky.  Pt denies numbness of the legs, or assoc pain. Past Medical History  Diagnosis Date  . Hypertension   . Depression   . Diabetes mellitus     type 2  . Menopausal syndrome   . Dyslipidemia   . Abnormal Pap smear of cervix     h/o HPV  . Sarcoid     post partum in remission  . Neck pain 05/20/2011  . Chronic sinusitis, chronic cough (?COPD) 07/16/2009  . Leukocytosis 11/19/2011  . Elevated LFTs 11/19/2011    Past Surgical History  Procedure Laterality Date  . Tubal ligation    . Pilondial cyst removal    . Abdominal plasty remotely      History   Social History  . Marital Status: Married    Spouse Name: N/A    Number of Children: 1  . 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 Smoking status: Current Every Day Smoker -- .5 years  . Smokeless tobacco: Never Used     Comment:    . Alcohol Use: No  . Drug Use: No  . Sexually Active: Not on file   Other Topics Concern  . Not on file   Social History Narrative   Lost husband, prostate cancer, 08-2012          Current Outpatient Prescriptions on File Prior to Visit  Medication Sig Dispense Refill  . bisoprolol-hydrochlorothiazide (ZIAC) 2.5-6.25 MG per tablet TAKE ONE TABLET BY MOUTH EVERY DAY  30 tablet  6  . escitalopram (LEXAPRO) 20 MG tablet TAKE ONE TABLET BY MOUTH EVERY DAY  30 tablet  3  . glimepiride (AMARYL) 2 MG tablet Take 2 mg by mouth daily before breakfast.      . loratadine (CLARITIN)  10 MG tablet Take 10 mg by mouth daily.        Marland Kitchen oxyCODONE-acetaminophen (PERCOCET/ROXICET) 5-325 MG per tablet Take 1 tablet by mouth every 4 (four) hours as needed for pain.  60 tablet  0  . pioglitazone (ACTOS) 30 MG tablet Take 15 mg by mouth daily.       . simvastatin (ZOCOR) 20 MG tablet Take 1 tablet (20 mg total) by mouth at bedtime.  90 tablet  1  . SitaGLIPtin-MetFORMIN HCl (JANUMET XR) 50-1000 MG TB24 Take 2 tablets by mouth every morning.  180 tablet  2   No current facility-administered medications on file prior to visit.    Allergies  Allergen Reactions  . Erythromycin     REACTION: palpitations; ZPACK is ok  . Fluoxetine Hcl     REACTION: hallucinations  . Venlafaxine     REACTION: made her feel bad    Family History  Problem Relation Age of Onset  . Hypertension Other     GM  . Colon cancer Other  COLON CA GGM x 2  . CAD Other     GF at age 61  . Diabetes Other     aunt  . Stroke Neg Hx     BP 130/80  Pulse 78  Ht 5\' 6"  (1.676 m)  Wt 206 lb (93.441 kg)  BMI 33.27 kg/m2  SpO2 98%  Review of Systems denies weight loss, blurry vision, chest pain, sob, n/v, urinary frequency, cramps, excessive diaphoresis, memory loss, hypoglycemia, rhinorrhea, and easy bruising.  She has headache. Depression is well-controlled.    Objective:   Physical Exam VS: see vs page GEN: no distress HEAD: head: no deformity eyes: no periorbital swelling, no proptosis external nose and ears are normal mouth: no lesion seen NECK: supple, thyroid is not enlarged CHEST WALL: no deformity LUNGS:  Clear to auscultation CV: reg rate and rhythm, no murmur ABD: abdomen is soft, nontender.  no hepatosplenomegaly.  not distended.  no hernia MUSCULOSKELETAL: muscle bulk and strength are grossly normal.  no obvious joint swelling.  gait is normal and steady PULSES: no carotid bruit NEURO:  cn 2-12 grossly intact.   readily moves all 4's.  SKIN:  Normal texture and temperature.  No  rash or suspicious lesion is visible.   NODES:  None palpable at the neck PSYCH: alert, oriented x3.  Does not appear anxious nor depressed.  Lab Results  Component Value Date   HGBA1C 8.7* 01/20/2013      Assessment & Plan:  DM: We discussed the nine oral agent categories available for type 2 diabetes.  This regimen gives the best risk-benefit ratio.  She says control is much better since last a1c. Depression.  This can complicate the rx of dm, but pt says it is well-controlled NASH: this is a reason for actos to be part of her regimen

## 2013-03-03 NOTE — Patient Instructions (Addendum)
good diet and exercise habits significanly improve the control of your diabetes.  please let me know if you wish to be referred to a dietician.  high blood sugar is very risky to your health.  you should see an eye doctor every year.  You are at higher than average risk for pneumonia and hepatitis-B.  You should be vaccinated against both.   controlling your blood pressure and cholesterol drastically reduces the damage diabetes does to your body.  this also applies to quitting smoking.  please discuss these with your doctor.  check your blood sugar once a day.  vary the time of day when you check, between before the 3 meals, and at bedtime.  also check if you have symptoms of your blood sugar being too high or too low.  please keep a record of the readings and bring it to your next appointment here.  please call us sooner if your blood sugar goes below 70, or if you have a lot of readings over 200. Please come back for a follow-up appointment in 2 months.

## 2013-05-26 ENCOUNTER — Ambulatory Visit (INDEPENDENT_AMBULATORY_CARE_PROVIDER_SITE_OTHER): Payer: BC Managed Care – PPO | Admitting: Internal Medicine

## 2013-05-26 ENCOUNTER — Encounter: Payer: Self-pay | Admitting: Internal Medicine

## 2013-05-26 VITALS — BP 132/82 | HR 67 | Temp 98.5°F | Wt 197.0 lb

## 2013-05-26 DIAGNOSIS — E119 Type 2 diabetes mellitus without complications: Secondary | ICD-10-CM

## 2013-05-26 DIAGNOSIS — E785 Hyperlipidemia, unspecified: Secondary | ICD-10-CM

## 2013-05-26 DIAGNOSIS — I10 Essential (primary) hypertension: Secondary | ICD-10-CM

## 2013-05-26 DIAGNOSIS — J209 Acute bronchitis, unspecified: Secondary | ICD-10-CM

## 2013-05-26 LAB — LIPID PANEL
Cholesterol: 163 mg/dL (ref 0–200)
HDL: 24 mg/dL — ABNORMAL LOW (ref >39.00)
LDL Cholesterol: 115 mg/dL — ABNORMAL HIGH (ref 0–99)
Triglycerides: 118 mg/dL (ref 0.0–149.0)
VLDL: 23.6 mg/dL (ref 0.0–40.0)

## 2013-05-26 LAB — HEMOGLOBIN A1C: Hgb A1c MFr Bld: 7 % — ABNORMAL HIGH (ref 4.6–6.5)

## 2013-05-26 MED ORDER — BISOPROLOL-HYDROCHLOROTHIAZIDE 2.5-6.25 MG PO TABS: ORAL_TABLET | ORAL | Status: DC

## 2013-05-26 MED ORDER — SITAGLIP PHOS-METFORMIN HCL ER 50-1000 MG PO TB24: 1.0000 | ORAL_TABLET | Freq: Every morning | ORAL | Status: DC

## 2013-05-26 MED ORDER — AMOXICILLIN 500 MG PO CAPS: 1000.0000 mg | ORAL_CAPSULE | Freq: Two times a day (BID) | ORAL | Status: DC

## 2013-05-26 MED ORDER — OXYCODONE-ACETAMINOPHEN 5-325 MG PO TABS: 1.0000 | ORAL_TABLET | ORAL | Status: DC | PRN

## 2013-05-26 MED ORDER — ESCITALOPRAM OXALATE 20 MG PO TABS: ORAL_TABLET | ORAL | Status: DC

## 2013-05-26 NOTE — Assessment & Plan Note (Signed)
Well-controlled, last BMP normal 

## 2013-05-26 NOTE — Patient Instructions (Signed)
Get your blood work before you leave  Next visit in 4 months   for a check up   Rest, fluids , tylenol For cough, take Mucinex DM twice a day as needed  Take the antibiotic as prescribed  (Amoxicillin) if no better in few days  Call if no better in few days Call anytime if the symptoms are severe

## 2013-05-26 NOTE — Assessment & Plan Note (Signed)
saw endocrinology few months ago but prefers to followup here diabetes year. Diet and exercise has improved significantly, has lost weight. Decided to increase Actos from 15-30 mg. Decrease Janumet from 1 twice a day to one daily. Plan: Labs

## 2013-05-26 NOTE — Assessment & Plan Note (Signed)
Self discontinue simvastatin several months ago, she just was tired of taking too many pills, did not have side effects. Plan: labs

## 2013-05-26 NOTE — Progress Notes (Signed)
  Subjective:    Patient ID: Alexandra Parsons, female    DOB: 10/04/1960, 52 y.o.   MRN: [id number]  HPI Here for the evaluation of the following: Diabetes--saw endocrinology but prefers to continue coming here, has improved her diet and exercise significantly. See assessment and plan High cholesterol--decided to stop simvastatin, not b/c side effects, " was taking too many medications" 2 days history of a cough with greenish sputum production, had some fever last night around 100.  Past Medical History  Diagnosis Date  . Hypertension   . Depression   . Diabetes mellitus     type 2  . Menopausal syndrome   . Dyslipidemia   . Abnormal Pap smear of cervix     h/o HPV  . Sarcoid     post partum in remission  . Neck pain 05/20/2011  . Chronic sinusitis, chronic cough (?COPD) 07/16/2009  . Leukocytosis 11/19/2011  . Elevated LFTs 11/19/2011   Past Surgical History  Procedure Laterality Date  . Tubal ligation    . Pilondial cyst removal    . Abdominal plasty remotely     History   Social History  . Marital Status: Married    Spouse Name: N/A    Number of Children: 1  . 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 Smoking status: Current Every Day Smoker -- .5 years  . Smokeless tobacco: Never Used     Comment:    . Alcohol Use: Yes  . Drug Use: No  . Sexual Activity: Not on file   Other Topics Concern  . Not on file   Social History Narrative   Lost husband, prostate cancer, 08-2012          Review of Systems No chest pain or shortness or breath No vomiting, diarrhea + Sore throat and runny nose, some earache as well.    Objective:   Physical Exam BP 132/82  Pulse 67  Temp(Src) 98.5 F (36.9 C)  Wt 197 lb (89.359 kg)  BMI 31.81 kg/m2  SpO2 97% General -- alert, well-developed, NAD.  HEENT-- Not pale. TMs normal, throat symmetric, no redness or discharge. Face symmetric, sinuses slt TTP on the L.  Nose not congested.  Lungs -- normal respiratory effort, no intercostal retractions, no accessory muscle use, and normal breath sounds.  Heart-- normal rate, regular rhythm, no murmur.   Extremities-- no pretibial edema bilaterally  Neurologic--  alert & oriented X3. Speech normal, gait normal, strength normal in all extremities.   Psych-- Cognition and judgment appear intact. Cooperative with normal attention span and concentration. No anxious appearing , no depressed appearing.      Assessment & Plan:  Mild bronchitis, see instructions

## 2013-05-27 ENCOUNTER — Encounter: Payer: Self-pay | Admitting: Internal Medicine

## 2013-05-29 ENCOUNTER — Encounter: Payer: Self-pay | Admitting: *Deleted

## 2013-09-15 ENCOUNTER — Telehealth: Payer: Self-pay | Admitting: *Deleted

## 2013-09-15 MED ORDER — OXYCODONE-ACETAMINOPHEN 5-325 MG PO TABS: 1.0000 | ORAL_TABLET | ORAL | Status: DC | PRN

## 2013-09-15 NOTE — Telephone Encounter (Signed)
Called and informed patient this is ready for pick up. JG//CMA

## 2013-09-15 NOTE — Telephone Encounter (Signed)
oxyCODONE-acetaminophen (PERCOCET/ROXICET) 5-325 MG per tablet Last refill: 05/26/13 #60, 0 refills Last OV: 05/23/13 Contract on file, I do not see a UDS

## 2013-09-15 NOTE — Telephone Encounter (Signed)
Prescription printed, UDS at the time of pickup.  (If the patient says she already did a UDS, give pt the rx and ask the lady from toxicology to print report)

## 2013-09-15 NOTE — Telephone Encounter (Signed)
Patient called and requested a refill for oxyCODONE-acetaminophen (PERCOCET/ROXICET) 5-325 MG per tablet ° °

## 2013-09-22 ENCOUNTER — Telehealth: Payer: Self-pay

## 2013-09-22 NOTE — Telephone Encounter (Signed)
UDS: 09/15/2013 Negative for Oxymorphone/Oxycodone (PRN) Low risk per Dr Drue NovelPaz

## 2013-09-29 ENCOUNTER — Ambulatory Visit: Payer: BC Managed Care – PPO | Admitting: Internal Medicine

## 2013-09-29 DIAGNOSIS — Z0289 Encounter for other administrative examinations: Secondary | ICD-10-CM

## 2013-11-14 ENCOUNTER — Encounter: Payer: Self-pay | Admitting: Internal Medicine

## 2013-12-12 ENCOUNTER — Telehealth: Payer: Self-pay | Admitting: Internal Medicine

## 2013-12-12 NOTE — Telephone Encounter (Signed)
Caller name: Keandrea Relation to pt: Call back number:[phone] Pharmacy:  Reason for call:  Pt is wanting a print out of labs from April of 2014 for [employer].  Please advise pt when complete.

## 2013-12-12 NOTE — Telephone Encounter (Signed)
Pt notified . Lab results waiting for pickup at front desk.

## 2014-01-02 LAB — HM MAMMOGRAPHY: HM Mammogram: NORMAL

## 2014-01-17 ENCOUNTER — Encounter: Payer: Self-pay | Admitting: *Deleted

## 2014-01-19 ENCOUNTER — Encounter: Payer: Self-pay | Admitting: Internal Medicine

## 2014-01-24 ENCOUNTER — Encounter: Payer: Self-pay | Admitting: Internal Medicine

## 2014-02-19 ENCOUNTER — Other Ambulatory Visit: Payer: Self-pay | Admitting: Internal Medicine

## 2014-02-22 ENCOUNTER — Telehealth: Payer: Self-pay | Admitting: *Deleted

## 2014-02-22 NOTE — Telephone Encounter (Signed)
Pharmacy states that patient cannot affrod janumet . Recommend metformin

## 2014-02-22 NOTE — Telephone Encounter (Signed)
Provide discount card and a new prescription for 30 days and 1 RF.  due for office visit please arrange

## 2014-02-28 ENCOUNTER — Telehealth: Payer: Self-pay

## 2014-02-28 MED ORDER — SITAGLIP PHOS-METFORMIN HCL ER 50-1000 MG PO TB24
1.0000 | ORAL_TABLET | Freq: Every morning | ORAL | Status: DC
Start: 2014-02-28 — End: 2014-05-21

## 2014-02-28 NOTE — Telephone Encounter (Signed)
Called patient//LMOVM to call back to schedule appointment for A1C and LDL.

## 2014-02-28 NOTE — Telephone Encounter (Signed)
Pt to come by front office to pick up coupon for janumet

## 2014-05-18 ENCOUNTER — Encounter: Payer: Self-pay | Admitting: Internal Medicine

## 2014-05-18 ENCOUNTER — Ambulatory Visit (INDEPENDENT_AMBULATORY_CARE_PROVIDER_SITE_OTHER): Payer: BC Managed Care – PPO | Admitting: Internal Medicine

## 2014-05-18 VITALS — BP 154/84 | HR 74 | Temp 98.6°F | Wt 206.5 lb

## 2014-05-18 DIAGNOSIS — M542 Cervicalgia: Secondary | ICD-10-CM

## 2014-05-18 DIAGNOSIS — I10 Essential (primary) hypertension: Secondary | ICD-10-CM

## 2014-05-18 DIAGNOSIS — E119 Type 2 diabetes mellitus without complications: Secondary | ICD-10-CM

## 2014-05-18 DIAGNOSIS — J209 Acute bronchitis, unspecified: Secondary | ICD-10-CM

## 2014-05-18 LAB — COMPREHENSIVE METABOLIC PANEL
ALBUMIN: 3.3 g/dL — AB (ref 3.5–5.2)
ALT: 29 U/L (ref 0–35)
AST: 26 U/L (ref 0–37)
Alkaline Phosphatase: 76 U/L (ref 39–117)
BUN: 11 mg/dL (ref 6–23)
CHLORIDE: 104 meq/L (ref 96–112)
CO2: 24 mEq/L (ref 19–32)
Calcium: 9.3 mg/dL (ref 8.4–10.5)
Creatinine, Ser: 0.7 mg/dL (ref 0.4–1.2)
GFR: 97.46 mL/min (ref >60.00)
GLUCOSE: 210 mg/dL — AB (ref 70–99)
POTASSIUM: 4 meq/L (ref 3.5–5.1)
SODIUM: 137 meq/L (ref 135–145)
Total Bilirubin: 0.5 mg/dL (ref 0.2–1.2)
Total Protein: 8.2 g/dL (ref 6.0–8.3)

## 2014-05-18 LAB — HEMOGLOBIN A1C: Hgb A1c MFr Bld: 7.6 % — ABNORMAL HIGH (ref 4.6–6.5)

## 2014-05-18 MED ORDER — DOXYCYCLINE HYCLATE 100 MG PO TABS: 100.0000 mg | ORAL_TABLET | Freq: Two times a day (BID) | ORAL | Status: DC

## 2014-05-18 MED ORDER — OXYCODONE-ACETAMINOPHEN 5-325 MG PO TABS
1.0000 | ORAL_TABLET | ORAL | Status: DC | PRN
Start: 2014-05-18 — End: 2014-10-29

## 2014-05-18 NOTE — Assessment & Plan Note (Signed)
BP today slightly elevated but at home is usually 120/70, labs, continue with same medications

## 2014-05-18 NOTE — Assessment & Plan Note (Signed)
Good compliance with medicines, diet exercise has not been as good as last year. Plan: A1c, recommend to come back regularly in order for us to help her (Last visit a year ago!)

## 2014-05-18 NOTE — Progress Notes (Signed)
Pre visit review using our clinic review tool, if applicable. No additional management support is needed unless otherwise documented below in the visit note. 

## 2014-05-18 NOTE — Progress Notes (Signed)
Subjective:    Patient ID: Alexandra LiesJulie Pe, female    DOB: 01/28/1961, 53 y.o.   MRN: [id number]  DOS:  05/18/2014 Type of visit - description : acute Interval history: 3 weeks history of a "cold", symptoms started at the sinuses, then chest got  congested, she took leftover amoxicillin, not doing better. Since she is here, we addressed chronic medical problems as well: Diabetes, good medication compliance, diet and exercise has not been as good. Ambulatory CBG in the 130s, never more than 155 Hypertension, good medication compliance, ambulatory BPs 120/70 Pain control, needs a prescription   ROS No fever or chills Postnasal dripping the basement, mild sinus pain. Mild nausea, no vomiting, diarrhea or unusual aches and pains. She does have cough with clear or green sputum  Past Medical History  Diagnosis Date  . Hypertension   . Depression   . Diabetes mellitus     type 2  . Menopausal syndrome   . Dyslipidemia   . Abnormal Pap smear of cervix     h/o HPV  . Sarcoid     post partum in remission  . Neck pain 05/20/2011  . Chronic sinusitis, chronic cough (?COPD) 07/16/2009  . Leukocytosis 11/19/2011  . Elevated LFTs 11/19/2011    Past Surgical History  Procedure Laterality Date  . Tubal ligation    . Pilondial cyst removal    . Abdominal plasty remotely      History   Social History  . Marital Status: Married    Spouse Name: N/A    Number of Children: 1  . 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 Smoking status: Current Every Day Smoker -- .5 years  . Smokeless tobacco: Never Used     Comment:    . Alcohol Use: Yes  . Drug Use: No  . Sexual Activity: Not on file   Other Topics Concern  . Not on file   Social History Narrative   Lost husband, prostate cancer, 08-2012              Medication List       This list is accurate as of: 05/18/14 11:59 PM.  Always use your most recent med  list.               bisoprolol-hydrochlorothiazide 2.5-6.25 MG per tablet  Commonly known as:  ZIAC  TAKE ONE TABLET BY MOUTH EVERY DAY     doxycycline 100 MG tablet  Commonly known as:  VIBRA-TABS  Take 1 tablet (100 mg total) by mouth 2 (two) times daily.     escitalopram 20 MG tablet  Commonly known as:  LEXAPRO  TAKE ONE TABLET BY MOUTH EVERY DAY     oxyCODONE-acetaminophen 5-325 MG per tablet  Commonly known as:  PERCOCET/ROXICET  Take 1 tablet by mouth every 4 (four) hours as needed.     pioglitazone 30 MG tablet  Commonly known as:  ACTOS  TAKE ONE TABLET BY MOUTH ONCE DAILY     SitaGLIPtin-MetFORMIN HCl 50-1000 MG Tb24  Take 1 tablet by mouth every morning.           Objective:   Physical Exam BP 154/84  Pulse 74  Temp(Src) 98.6 F (37 C) (Oral)  Wt 206 lb 8 oz (93.668 kg)  SpO2 98%  General -- alert, well-developed, NAD.  HEENT-- Not pale.  R Ear-- normal L ear-- normal Throat  symmetric, no redness or discharge. Face symmetric, sinuses not tender to palpation. Nose congested.  Lungs -- normal respiratory effort, no intercostal retractions, no accessory muscle use, and few ronchi Heart-- normal rate, regular rhythm, no murmur.   Extremities-- no pretibial edema bilaterally  Neurologic--  alert & oriented X3. Speech normal, gait appropriate for age, strength symmetric and appropriate for age.  Psych-- Cognition and judgment appear intact. Cooperative with normal attention span and concentration. No anxious or depressed appearing.       Assessment & Plan:   Bronchitis, Recommend to discontinue amoxicillin, start doxycycline, see instructions. Recommend to get a flu shot once she is better

## 2014-05-18 NOTE — Assessment & Plan Note (Signed)
UDS 09-2013 low risk, refill percocet

## 2014-05-18 NOTE — Patient Instructions (Signed)
Get your blood work before you leave   Rest, fluids , tylenol For cough, take Mucinex DM twice a day as needed  For nasal congestion use OTC Nasocort: 2 nasal sprays on each side of the nose daily until you feel better  Take the antibiotic as prescribed  (doxy) Call if not gradually better over the next 3-4 days Call anytime if the symptoms are severe  Please come back to the office in 3 months  for a routine check up   Come back fasting

## 2014-05-21 ENCOUNTER — Telehealth: Payer: Self-pay | Admitting: Internal Medicine

## 2014-05-21 MED ORDER — SITAGLIP PHOS-METFORMIN HCL ER 50-1000 MG PO TB24: 1.0000 | ORAL_TABLET | Freq: Two times a day (BID) | ORAL | Status: DC

## 2014-05-21 NOTE — Telephone Encounter (Signed)
emmi emailed °

## 2014-05-21 NOTE — Addendum Note (Signed)
Addended by: Dorette GrateFAULKNER, Rayana Geurin C on: 05/21/2014 01:27 PM   Modules accepted: Orders

## 2014-05-25 ENCOUNTER — Telehealth: Payer: Self-pay | Admitting: Internal Medicine

## 2014-05-25 MED ORDER — DOXYCYCLINE HYCLATE 100 MG PO TABS: 100.0000 mg | ORAL_TABLET | Freq: Two times a day (BID) | ORAL | Status: DC

## 2014-05-25 NOTE — Telephone Encounter (Signed)
Caller name: Relation to pt: Call back number: Pharmacy:  Reason for call:  Pt was seen on 10/9 acute bronchitis/ was given doxycycline (VIBRA-TABS) 100 MG tablet.  Took last pill last night and is still coughing up green phlegm.  Wants to know if she needs to get a refill to continue for a few more days or should she take something else.  Please advise.

## 2014-05-25 NOTE — Telephone Encounter (Signed)
LMOM for Pt informing her that 3 additional days of doxycycline have been sent to [facility]. Instructed if she is still not feeling better after the end of the third day she will need to return for another OV.

## 2014-05-25 NOTE — Telephone Encounter (Signed)
Call in 3 additional days of doxycycline --- #6 no refills

## 2014-05-25 NOTE — Telephone Encounter (Signed)
Please advise 

## 2014-05-29 ENCOUNTER — Telehealth: Payer: Self-pay | Admitting: Internal Medicine

## 2014-05-29 NOTE — Telephone Encounter (Signed)
Pt refuses to schedule appt, instructed her that we can't help her if she doesn't want to come in and be seen.

## 2014-05-29 NOTE — Telephone Encounter (Signed)
Ok, schedule a OV @ her earliest convenience . Prescription for hydrocodone, stronger cough suppressant,  is ready to be printed if she decides to take that until she gets seen

## 2014-05-29 NOTE — Telephone Encounter (Signed)
Please advise 

## 2014-05-29 NOTE — Telephone Encounter (Signed)
Caller name:Alexandra Raynelle FanningJulie Relation to pt: Call back number: [phone] Pharmacy:  Reason for call: Pt states was in office 10.09.15 for bronchitis. Pt states still has cough with lots of mucus. Wants to know if needed to come back to office. Please advise.

## 2014-05-29 NOTE — Telephone Encounter (Signed)
She is status post 10 days of antibiotics, I doubt more antibiotics will help. Plan:  Consistent use of Nasacort, Mucinex DM. Rest fluids and Tylenol. Schedule a office visit for Friday, if she is better she can cancel it.

## 2014-05-29 NOTE — Telephone Encounter (Signed)
Spoke with Pt, she has been using Nasacort and Mucinex DM with no relief, tried getting Pt to schedule appt tomorrow with Selena BattenCody but Pt refused stating she has other engagements at work.

## 2014-06-14 ENCOUNTER — Ambulatory Visit (INDEPENDENT_AMBULATORY_CARE_PROVIDER_SITE_OTHER): Payer: BC Managed Care – PPO | Admitting: Internal Medicine

## 2014-06-14 ENCOUNTER — Encounter: Payer: Self-pay | Admitting: Internal Medicine

## 2014-06-14 ENCOUNTER — Ambulatory Visit (HOSPITAL_BASED_OUTPATIENT_CLINIC_OR_DEPARTMENT_OTHER)
Admission: RE | Admit: 2014-06-14 | Discharge: 2014-06-14 | Disposition: A | Discharge status: Home / Self Care | Payer: BC Managed Care – PPO | Source: Ambulatory Visit | Attending: Internal Medicine | Admitting: Internal Medicine

## 2014-06-14 VITALS — BP 124/68 | HR 87 | Temp 98.0°F | Wt 208.5 lb

## 2014-06-14 DIAGNOSIS — J320 Chronic maxillary sinusitis: Secondary | ICD-10-CM

## 2014-06-14 DIAGNOSIS — R059 Cough, unspecified: Secondary | ICD-10-CM

## 2014-06-14 DIAGNOSIS — R05 Cough: Secondary | ICD-10-CM | POA: Diagnosis not present

## 2014-06-14 MED ORDER — HYDROCODONE-HOMATROPINE 5-1.5 MG/5ML PO SYRP: 5.0000 mL | ORAL_SOLUTION | Freq: Two times a day (BID) | ORAL | Status: DC | PRN

## 2014-06-14 MED ORDER — BUDESONIDE-FORMOTEROL FUMARATE 160-4.5 MCG/ACT IN AERO: 2.0000 | INHALATION_SPRAY | Freq: Two times a day (BID) | RESPIRATORY_TRACT | Status: DC

## 2014-06-14 MED ORDER — FLUCONAZOLE 150 MG PO TABS: 150.0000 mg | ORAL_TABLET | Freq: Every day | ORAL | Status: DC

## 2014-06-14 NOTE — Progress Notes (Signed)
Pre visit review using our clinic review tool, if applicable. No additional management support is needed unless otherwise documented below in the visit note. 

## 2014-06-14 NOTE — Patient Instructions (Signed)
Get a chest x-ray on the first floor   Rest, fluids , tylenol Symbicort 2 puffs twice a day for a month, okay to take a second month if needed If  cough, take Mucinex DM twice a day as needed  Take hydrocodone if the   cough continue, will make you drowsy Diflucan 150 mg 1 tablet a day for 2 days  Call if not gradually better over the next  10 days Call anytime if the symptoms are severe

## 2014-06-14 NOTE — Progress Notes (Signed)
Subjective:    Patient ID: Alexandra Parsons, female    DOB: 09/11/1960, 53 y.o.   MRN: [id number]  DOS:  06/14/2014 Type of visit - description : acute, not feeling better Interval history: Was recently seen with respiratory symptoms, was prescribed doxycycline, initially improve but now is feeling poorly again: Cough, chest tightness, greenish sputum production. Has also developed vaginal itching, thinks is a yeast infection   ROS No fever or chills per se Continue with mild sinus congestion but no pain No nausea, vomiting, diarrhea. Admits to occasional wheezing, no hemoptysis. She continue smoking.  Past Medical History  Diagnosis Date  . Hypertension   . Depression   . Diabetes mellitus     type 2  . Menopausal syndrome   . Dyslipidemia   . Abnormal Pap smear of cervix     h/o HPV  . Sarcoid     post partum in remission  . Neck pain 05/20/2011  . Chronic sinusitis, chronic cough (?COPD) 07/16/2009  . Leukocytosis 11/19/2011  . Elevated LFTs 11/19/2011    Past Surgical History  Procedure Laterality Date  . Tubal ligation    . Pilondial cyst removal    . Abdominal plasty remotely      History   Social History  . Marital Status: Married    Spouse Name: N/A    Number of Children: 1  . 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 Smoking status: Current Every Day Smoker -- .5 years  . Smokeless tobacco: Never Used     Comment:    . Alcohol Use: Yes  . Drug Use: No  . Sexual Activity: Not on file   Other Topics Concern  . Not on file   Social History Narrative   Lost husband, prostate cancer, 08-2012              Medication List       This list is accurate as of: 06/14/14 11:59 PM.  Always use your most recent med list.               bisoprolol-hydrochlorothiazide 2.5-6.25 MG per tablet  Commonly known as:  ZIAC  TAKE ONE TABLET BY MOUTH EVERY DAY     budesonide-formoterol 160-4.5  MCG/ACT inhaler  Commonly known as:  SYMBICORT  Inhale 2 puffs into the lungs 2 (two) times daily.     escitalopram 20 MG tablet  Commonly known as:  LEXAPRO  TAKE ONE TABLET BY MOUTH EVERY DAY     fluconazole 150 MG tablet  Commonly known as:  DIFLUCAN  Take 1 tablet (150 mg total) by mouth daily.     HYDROcodone-homatropine 5-1.5 MG/5ML syrup  Commonly known as:  HYCODAN  Take 5 mLs by mouth 2 (two) times daily as needed for cough.     oxyCODONE-acetaminophen 5-325 MG per tablet  Commonly known as:  PERCOCET/ROXICET  Take 1 tablet by mouth every 4 (four) hours as needed.     pioglitazone 30 MG tablet  Commonly known as:  ACTOS  TAKE ONE TABLET BY MOUTH ONCE DAILY     SitaGLIPtin-MetFORMIN HCl 50-1000 MG Tb24  Take 1 tablet by mouth 2 (two) times daily.           Objective:   Physical Exam BP 124/68 mmHg  Pulse 87  Temp(Src) 98 F (36.7 C) (Oral)  Wt 208 lb 8 oz (94.575 kg)  SpO2  98% General -- alert, well-developed, NAD.   HEENT-- Not pale.  R Ear-- normal L ear-- normal Throat symmetric, no redness or discharge. Face symmetric, slt TTP L max sinus?  Nose not congested. Lungs -- normal respiratory effort, no intercostal retractions, no accessory muscle use, breath sounds are slightly decreased, slightly increased expiratory time, no wheezing, rhonchi or crackles.  Heart-- normal rate, regular rhythm, no murmur.   Extremities-- no pretibial edema bilaterally  Neurologic--  alert & oriented X3. Speech normal, gait appropriate for age, strength symmetric and appropriate for age.  Psych-- Cognition and judgment appear intact. Cooperative with normal attention span and concentration. No anxious or depressed appearing.        Assessment & Plan:

## 2014-06-15 NOTE — Assessment & Plan Note (Signed)
Cough, Persisting cough for 4 weeks. Status post amoxicillin, doxycycline. She smokes  and occasionally wheezes. Plan: Chest x-ray Symbicort, Mucinex, hydrocodone as needed. Treat yeast infection. If not better in 10 days will call. Patient thinks may need a Z-Pak

## 2014-07-16 ENCOUNTER — Other Ambulatory Visit: Payer: Self-pay

## 2014-07-20 ENCOUNTER — Other Ambulatory Visit: Payer: Self-pay

## 2014-07-20 MED ORDER — BISOPROLOL-HYDROCHLOROTHIAZIDE 2.5-6.25 MG PO TABS: ORAL_TABLET | ORAL | Status: DC

## 2014-07-20 MED ORDER — ESCITALOPRAM OXALATE 20 MG PO TABS: ORAL_TABLET | ORAL | Status: DC

## 2014-08-28 ENCOUNTER — Encounter: Payer: BC Managed Care – PPO | Admitting: Internal Medicine

## 2014-10-26 ENCOUNTER — Telehealth: Payer: Self-pay | Admitting: *Deleted

## 2014-10-26 ENCOUNTER — Encounter: Payer: Self-pay | Admitting: *Deleted

## 2014-10-26 NOTE — Telephone Encounter (Signed)
Pre-Visit Call completed with patient and chart updated.   Pre-Visit Info documented in Specialty Comments under [employer].    

## 2014-10-29 ENCOUNTER — Ambulatory Visit (INDEPENDENT_AMBULATORY_CARE_PROVIDER_SITE_OTHER): Payer: BLUE CROSS/BLUE SHIELD | Admitting: Internal Medicine

## 2014-10-29 ENCOUNTER — Encounter: Payer: Self-pay | Admitting: Internal Medicine

## 2014-10-29 VITALS — BP 128/78 | HR 80 | Temp 98.2°F | Ht 66.0 in | Wt 199.2 lb

## 2014-10-29 DIAGNOSIS — Z0000 Encounter for general adult medical examination without abnormal findings: Secondary | ICD-10-CM

## 2014-10-29 DIAGNOSIS — E119 Type 2 diabetes mellitus without complications: Secondary | ICD-10-CM | POA: Diagnosis not present

## 2014-10-29 MED ORDER — OXYCODONE-ACETAMINOPHEN 5-325 MG PO TABS: 1.0000 | ORAL_TABLET | ORAL | Status: DC | PRN

## 2014-10-29 NOTE — Patient Instructions (Signed)
Get your blood work before you leave     Come back to the office in 3 months  for a routine check up    

## 2014-10-29 NOTE — Assessment & Plan Note (Addendum)
Td 2008 Zostavax discussed before  Flu shot-- states she had one this season   03-2009  Cscope neg, next in 10 years   Had a gyn visit < 2 years ago but due to see them again, rec to call [facility]--01/02/14-with Amie Portlandavid Ormond, [facility]- BI-RADS CAT 1: Neg   patient is menopausal, no recent bone density test, will schedule one.   Diet-exercise - tobacco---discussed    other issues: Hypertension, ambulatory BPs usually normal. Diabetes, room for improvement of her diet and exercise, that was discussed, ambulatory CBGs 130 to 160. We are checking an A1c and microalbumin.  pain management, will get a UDS, on oxycodone for chronic pain. Refill as needed.  Slt  increase myalgias, recommend follow-up in 3 months

## 2014-10-29 NOTE — Progress Notes (Signed)
Subjective:    Patient ID: Alexandra Parsons, female    DOB: 06-01-1961, 54 y.o.   MRN: [id number]  DOS:  10/29/2014 Type of visit - description : cpx Interval history:  no new concerns, good compliance with medication.    Review of Systems Constitutional: No fever, chills. No unexplained wt changes. No unusual sweats HEENT: + dental problems,  Asked to see a dentist, noear discharge, facial swelling, voice changes. No eye discharge, redness or intolerance to light Respiratory: No wheezing or difficulty breathing. No cough , mucus production Cardiovascular: No CP, leg swelling or palpitations GI: no nausea, vomiting, diarrhea or abdominal pain.  No blood in the stools. No dysphagia   Endocrine: No polyphagia, polyuria or polydipsia GU: No dysuria, gross hematuria, difficulty urinating. No urinary urgency , + frequency,  At baseline, going on for years Musculoskeletal:   Has chronic neck pain, here lately has noted some mild myalgias, slightly worse than baseline. Denies any joint puffiness except for the left wrist sometimes Skin: No change in the color of the skin, palor or rash Allergic, immunologic: No environmental allergies or food allergies;  Chronic sinus symptoms at baseline Neurological: No dizziness or syncope. No headaches. No diplopia, slurred speech, motor deficits, facial numbness Hematological: No enlarged lymph nodes, easy bruising or bleeding Psychiatry: No suicidal ideas, hallucinations, behavior problems or confusion. No unusual/severe anxiety or depression.     Past Medical History  Diagnosis Date  . Hypertension   . Depression   . Diabetes mellitus     type 2  . Menopausal syndrome   . Dyslipidemia   . Abnormal Pap smear of cervix     h/o HPV  . Sarcoid     post partum in remission  . Neck pain 05/20/2011  . Chronic sinusitis, chronic cough (?COPD) 07/16/2009  . Leukocytosis 11/19/2011  . Elevated LFTs 11/19/2011    Past Surgical History  Procedure  Laterality Date  . Tubal ligation    . Pilondial cyst removal    . Abdominal plasty remotely      History   Social History  . Marital Status: Married    Spouse Name: N/A  . Number of Children: 1  . 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 Smoking status: Current Every Day Smoker -- .5 years  . Smokeless tobacco: Never Used     Comment:    . Alcohol Use: Yes  . Drug Use: No  . Sexual Activity: Not on file   Other Topics Concern  . Not on file   Social History Narrative   Lost husband, prostate cancer, 08-2012              Medication List       This list is accurate as of: 10/29/14  3:52 PM.  Always use your most recent med list.               bisoprolol-hydrochlorothiazide 2.5-6.25 MG per tablet  Commonly known as:  ZIAC  TAKE ONE TABLET BY MOUTH EVERY DAY     budesonide-formoterol 160-4.5 MCG/ACT inhaler  Commonly known as:  SYMBICORT  Inhale 2 puffs into the lungs 2 (two) times daily.     escitalopram 20 MG tablet  Commonly known as:  LEXAPRO  TAKE ONE TABLET BY MOUTH EVERY DAY     oxyCODONE-acetaminophen 5-325 MG per tablet  Commonly known as:  PERCOCET/ROXICET  Take 1  tablet by mouth every 4 (four) hours as needed.     pioglitazone 30 MG tablet  Commonly known as:  ACTOS  TAKE ONE TABLET BY MOUTH ONCE DAILY     SitaGLIPtin-MetFORMIN HCl 50-1000 MG Tb24  Take 1 tablet by mouth 2 (two) times daily.           Objective:   Physical Exam BP 128/78 mmHg  Pulse 80  Temp(Src) 98.2 F (36.8 C) (Oral)  Ht 5\' 6"  (1.676 m)  Wt 199 lb 4 oz (90.379 kg)  BMI 32.18 kg/m2  SpO2 98%  General:   Well developed, well nourished . NAD.  Neck:  Full range of motion. Supple. No  thyromegaly , normal carotid pulse HEENT:  Normocephalic . Face symmetric, atraumatic Lungs:  CTA B Normal respiratory effort, no intercostal retractions, no accessory muscle use. Heart: RRR,  no murmur.    Abdomen:  Not distended, soft, non-tender. No rebound or rigidity. No mass,organomegaly Muscle skeletal: no pretibial edema bilaterally  Skin: Exposed areas without rash. Not pale. Not jaundice Neurologic:  alert & oriented X3.  Speech normal, gait appropriate for age and unassisted Strength symmetric and appropriate for age.  Psych: Cognition and judgment appear intact.  Cooperative with normal attention span and concentration.  Behavior appropriate. No anxious or depressed appearing.       Assessment & Plan:

## 2014-10-29 NOTE — Progress Notes (Signed)
Pre visit review using our clinic review tool, if applicable. No additional management support is needed unless otherwise documented below in the visit note. 

## 2014-10-30 LAB — CBC WITH DIFFERENTIAL/PLATELET
BASOS ABS: 0.1 10*3/uL (ref 0.0–0.1)
BASOS PCT: 0.9 % (ref 0.0–3.0)
Eosinophils Absolute: 0.1 10*3/uL (ref 0.0–0.7)
Eosinophils Relative: 1.1 % (ref 0.0–5.0)
HCT: 41 % (ref 36.0–46.0)
Hemoglobin: 14.2 g/dL (ref 12.0–15.0)
Lymphocytes Relative: 27.3 % (ref 12.0–46.0)
Lymphs Abs: 2.5 10*3/uL (ref 0.7–4.0)
MCHC: 34.6 g/dL (ref 30.0–36.0)
MCV: 89.1 fl (ref 78.0–100.0)
MONO ABS: 0.7 10*3/uL (ref 0.1–1.0)
Monocytes Relative: 7.2 % (ref 3.0–12.0)
NEUTROS PCT: 63.5 % (ref 43.0–77.0)
Neutro Abs: 5.8 10*3/uL (ref 1.4–7.7)
PLATELETS: 208 10*3/uL (ref 150.0–400.0)
RBC: 4.59 Mil/uL (ref 3.87–5.11)
RDW: 13.5 % (ref 11.5–15.5)
WBC: 9.1 10*3/uL (ref 4.0–10.5)

## 2014-10-30 LAB — MICROALBUMIN / CREATININE URINE RATIO
CREATININE, U: 368 mg/dL
MICROALB UR: 3.8 mg/dL — AB (ref 0.0–1.9)
MICROALB/CREAT RATIO: 1 mg/g (ref 0.0–30.0)

## 2014-10-30 LAB — LIPID PANEL
CHOLESTEROL: 156 mg/dL (ref 0–200)
HDL: 21.9 mg/dL — ABNORMAL LOW (ref >39.00)
LDL Cholesterol: 107 mg/dL — ABNORMAL HIGH (ref 0–99)
NonHDL: 134.1
TRIGLYCERIDES: 136 mg/dL (ref 0.0–149.0)
Total CHOL/HDL Ratio: 7
VLDL: 27.2 mg/dL (ref 0.0–40.0)

## 2014-10-30 LAB — BASIC METABOLIC PANEL
BUN: 15 mg/dL (ref 6–23)
CO2: 27 mEq/L (ref 19–32)
Calcium: 9.4 mg/dL (ref 8.4–10.5)
Chloride: 103 mEq/L (ref 96–112)
Creatinine, Ser: 0.76 mg/dL (ref 0.40–1.20)
GFR: 84.12 mL/min (ref >60.00)
GLUCOSE: 120 mg/dL — AB (ref 70–99)
POTASSIUM: 3.6 meq/L (ref 3.5–5.1)
Sodium: 136 mEq/L (ref 135–145)

## 2014-10-30 LAB — VITAMIN D 25 HYDROXY (VIT D DEFICIENCY, FRACTURES): VITD: 21.16 ng/mL — ABNORMAL LOW (ref 30.00–100.00)

## 2014-10-30 LAB — ALT: ALT: 31 U/L (ref 0–35)

## 2014-10-30 LAB — AST: AST: 39 U/L — AB (ref 0–37)

## 2014-10-30 LAB — HEMOGLOBIN A1C: Hgb A1c MFr Bld: 9.2 % — ABNORMAL HIGH (ref 4.6–6.5)

## 2014-11-01 MED ORDER — GLIMEPIRIDE 4 MG PO TABS: 4.0000 mg | ORAL_TABLET | Freq: Every day | ORAL | Status: DC

## 2014-11-01 MED ORDER — VITAMIN D (ERGOCALCIFEROL) 1.25 MG (50000 UNIT) PO CAPS: 50000.0000 [IU] | ORAL_CAPSULE | ORAL | Status: DC

## 2014-11-01 NOTE — Addendum Note (Signed)
Addended by: Dorette GrateFAULKNER, Yehoshua Vitelli C on: 11/01/2014 01:41 PM   Modules accepted: Orders

## 2015-01-04 ENCOUNTER — Other Ambulatory Visit: Payer: Self-pay | Admitting: Internal Medicine

## 2015-01-29 ENCOUNTER — Ambulatory Visit: Payer: BLUE CROSS/BLUE SHIELD | Admitting: Internal Medicine

## 2015-02-14 ENCOUNTER — Other Ambulatory Visit: Payer: Self-pay | Admitting: Internal Medicine

## 2015-04-12 ENCOUNTER — Other Ambulatory Visit: Payer: Self-pay | Admitting: Internal Medicine

## 2015-07-11 ENCOUNTER — Other Ambulatory Visit: Payer: Self-pay | Admitting: Internal Medicine

## 2015-08-15 ENCOUNTER — Ambulatory Visit (INDEPENDENT_AMBULATORY_CARE_PROVIDER_SITE_OTHER): Payer: Managed Care, Other (non HMO) | Admitting: Internal Medicine

## 2015-08-15 ENCOUNTER — Encounter: Payer: Self-pay | Admitting: Internal Medicine

## 2015-08-15 VITALS — BP 124/76 | HR 87 | Temp 98.0°F | Ht 66.0 in | Wt 192.5 lb

## 2015-08-15 DIAGNOSIS — F329 Major depressive disorder, single episode, unspecified: Secondary | ICD-10-CM

## 2015-08-15 DIAGNOSIS — R220 Localized swelling, mass and lump, head: Secondary | ICD-10-CM | POA: Diagnosis not present

## 2015-08-15 DIAGNOSIS — M542 Cervicalgia: Secondary | ICD-10-CM

## 2015-08-15 DIAGNOSIS — F32A Depression, unspecified: Secondary | ICD-10-CM

## 2015-08-15 DIAGNOSIS — I10 Essential (primary) hypertension: Secondary | ICD-10-CM

## 2015-08-15 DIAGNOSIS — E119 Type 2 diabetes mellitus without complications: Secondary | ICD-10-CM | POA: Diagnosis not present

## 2015-08-15 DIAGNOSIS — M278 Other specified diseases of jaws: Secondary | ICD-10-CM

## 2015-08-15 LAB — CBC WITH DIFFERENTIAL/PLATELET
BASOS ABS: 0 10*3/uL (ref 0.0–0.1)
Basophils Relative: 0.5 % (ref 0.0–3.0)
Eosinophils Absolute: 0.2 10*3/uL (ref 0.0–0.7)
Eosinophils Relative: 2.5 % (ref 0.0–5.0)
HEMATOCRIT: 42.1 % (ref 36.0–46.0)
HEMOGLOBIN: 14.1 g/dL (ref 12.0–15.0)
LYMPHS PCT: 31.7 % (ref 12.0–46.0)
Lymphs Abs: 2.9 10*3/uL (ref 0.7–4.0)
MCHC: 33.5 g/dL (ref 30.0–36.0)
MCV: 91.9 fl (ref 78.0–100.0)
MONOS PCT: 6.7 % (ref 3.0–12.0)
Monocytes Absolute: 0.6 10*3/uL (ref 0.1–1.0)
Neutro Abs: 5.3 10*3/uL (ref 1.4–7.7)
Neutrophils Relative %: 58.6 % (ref 43.0–77.0)
Platelets: 207 10*3/uL (ref 150.0–400.0)
RBC: 4.58 Mil/uL (ref 3.87–5.11)
RDW: 14.1 % (ref 11.5–15.5)
WBC: 9.1 10*3/uL (ref 4.0–10.5)

## 2015-08-15 LAB — COMPREHENSIVE METABOLIC PANEL
ALK PHOS: 83 U/L (ref 39–117)
ALT: 20 U/L (ref 0–35)
AST: 18 U/L (ref 0–37)
Albumin: 3.9 g/dL (ref 3.5–5.2)
BILIRUBIN TOTAL: 0.6 mg/dL (ref 0.2–1.2)
BUN: 10 mg/dL (ref 6–23)
CO2: 29 mEq/L (ref 19–32)
Calcium: 10.1 mg/dL (ref 8.4–10.5)
Chloride: 101 mEq/L (ref 96–112)
Creatinine, Ser: 0.68 mg/dL (ref 0.40–1.20)
GFR: 95.36 mL/min (ref >60.00)
Glucose, Bld: 396 mg/dL — ABNORMAL HIGH (ref 70–99)
Potassium: 3.7 mEq/L (ref 3.5–5.1)
Sodium: 135 mEq/L (ref 135–145)
Total Protein: 7.7 g/dL (ref 6.0–8.3)

## 2015-08-15 LAB — HEMOGLOBIN A1C: Hgb A1c MFr Bld: 10 % — ABNORMAL HIGH (ref 4.6–6.5)

## 2015-08-15 MED ORDER — AMOXICILLIN-POT CLAVULANATE 875-125 MG PO TABS: 1.0000 | ORAL_TABLET | Freq: Two times a day (BID) | ORAL | Status: DC

## 2015-08-15 MED ORDER — SITAGLIP PHOS-METFORMIN HCL ER 50-1000 MG PO TB24: 2.0000 | ORAL_TABLET | Freq: Every day | ORAL | Status: DC

## 2015-08-15 MED ORDER — ESCITALOPRAM OXALATE 20 MG PO TABS: 20.0000 mg | ORAL_TABLET | Freq: Every day | ORAL | Status: DC

## 2015-08-15 MED ORDER — OXYCODONE-ACETAMINOPHEN 5-325 MG PO TABS: 1.0000 | ORAL_TABLET | ORAL | Status: DC | PRN

## 2015-08-15 MED ORDER — PIOGLITAZONE HCL 30 MG PO TABS: 30.0000 mg | ORAL_TABLET | Freq: Every day | ORAL | Status: DC

## 2015-08-15 MED ORDER — VITAMIN D (ERGOCALCIFEROL) 1.25 MG (50000 UNIT) PO CAPS: 50000.0000 [IU] | ORAL_CAPSULE | ORAL | Status: DC

## 2015-08-15 NOTE — Progress Notes (Signed)
Subjective:    Patient ID: Alexandra Parsons, female    DOB: 09/17/1960, 55 y.o.   MRN: [id number]  DOS:  08/15/2015 Type of visit - description : Routine checkup, last office visit 10/29/2014 Interval history: Lost her insurance, has not been able to take many of her medications. DM: Not taking any medications. Amb  CBGs reportedly  150, 200. I note that she has lost 7 pounds. HTN: Taking Ziac, no ambulatory BPs, BP today is very good Anxiety depression: on Lexapro Neck pain: Needs a prescription for pain medication Neck mass: Several months ago had dental pain at ~ premolars # 29-30 tooth, 3 months ago the pain subsided but she developed a knot at the submandibular area. Has been taking Keflex and lately doxycycline intermittently and that seems to "keep it at bay" , knot is painful. No fever chills   Wt Readings from Last 3 Encounters:  08/15/15 192 lb 8 oz (87.317 kg)  10/29/14 199 lb 4 oz (90.379 kg)  06/14/14 208 lb 8 oz (94.575 kg)     Review of Systems   Past Medical History  Diagnosis Date  . Hypertension   . Depression   . Diabetes mellitus     type 2  . Menopausal syndrome   . Dyslipidemia   . Abnormal Pap smear of cervix     h/o HPV  . Sarcoid (HCC)     post partum in remission  . Neck pain 05/20/2011  . Chronic sinusitis, chronic cough (?COPD) 07/16/2009  . Leukocytosis 11/19/2011  . Elevated LFTs 11/19/2011    Past Surgical History  Procedure Laterality Date  . Tubal ligation    . Pilondial cyst removal    . Abdominal plasty remotely      Social History   Social History  . Marital Status: Married    Spouse Name: N/A  . Number of Children: 1  . Years of Education: N/A   Occupational History  . RN [employer]    Social History Main Topics  . Smoking status: Current Every Day Smoker -- .5 years  . Smokeless tobacco: Never Used     Comment:  3/4 ppd  . Alcohol Use: 0.0 oz/week    0 Standard drinks or equivalent per week  . Drug Use:  No  . Sexual Activity: Not on file   Other Topics Concern  . Not on file   Social History Narrative   Lost husband, prostate cancer, 08-2012   Lives by herself   Has a daughter , [place]           Medication List       This list is accurate as of: 08/15/15  8:43 PM.  Always use your most recent med list.               amoxicillin-clavulanate 875-125 MG tablet  Commonly known as:  AUGMENTIN  Take 1 tablet by mouth 2 (two) times daily.     bisoprolol-hydrochlorothiazide 2.5-6.25 MG tablet  Commonly known as:  ZIAC  Take 1 tablet by mouth daily.     escitalopram 20 MG tablet  Commonly known as:  LEXAPRO  Take 1 tablet (20 mg total) by mouth daily.     oxyCODONE-acetaminophen 5-325 MG tablet  Commonly known as:  PERCOCET/ROXICET  Take 1 tablet by mouth every 4 (four) hours as needed.     pioglitazone 30 MG tablet  Commonly known as:  ACTOS  Take 1 tablet (30 mg total)  by mouth daily.     SitaGLIPtin-MetFORMIN HCl 50-1000 MG Tb24  Commonly known as:  JANUMET XR  Take 2 tablets by mouth daily.     Vitamin D (Ergocalciferol) 50000 units Caps capsule  Commonly known as:  DRISDOL  Take 1 capsule (50,000 Units total) by mouth every 7 (seven) days.           Objective:   Physical Exam  HENT:  Mouth/Throat:    Neck:     BP 124/76 mmHg  Pulse 87  Temp(Src) 98 F (36.7 C) (Oral)  Ht 5\' 6"  (1.676 m)  Wt 192 lb 8 oz (87.317 kg)  BMI 31.08 kg/m2  SpO2 97% General:   Well developed, well nourished . NAD.  HEENT:  Normocephalic . Face symmetric, atraumatic Lungs:  CTA B Normal respiratory effort, no intercostal retractions, no accessory muscle use. Heart: RRR,  no murmur.  no pretibial edema bilaterally  Abdomen:  Not distended, soft, non-tender. No rebound or rigidity.   Skin: Not pale. Not jaundice Neurologic:  alert & oriented X3.  Speech normal, gait appropriate for age and unassisted Psych--  Cognition and judgment appear intact.    Cooperative with normal attention span and concentration.  Behavior appropriate. No anxious or depressed appearing.    Assessment & Plan:   Assessment DM HTN Dyslipidemia Vit d def  Smoker ~ 3/4 ppd Psych: --Depression --ADHD MSK: Neck pain, chronic, on pain meds prn, UDS 09-2013 risk GYN: --BTL, Menopausal syndrome, Abnormal Pap smear, HPV Elevated LFTs: Hepatitis serology (-) 2008 H/o Sarcoid postpartum in remission H/o Chronic cough: Chronic sinusitis? COPD? H/o Leukocytosis: Her to endocrinology 2014, did not happen H/o +  PPD   PLAN Has not been taking many of her medications for several months due to lack of insurance. DM: Check A1c, re- start Janumet, Actos, declined to start glimepiride because she reports episodes hypoglycemia while on glimeperide HTN: Continue with ziac, check a CMP Vitamin D deficiency:   was prescribed ergocalciferol but was not able to get it. Prescription redone. Neck mass: Although this could be reactive LAD d/t a odontogenic infection, she is high risk for malignancy (smoker)  and a physical exam has some concerning features (firm mass, decrease mobility). Plan--to ENT, Augmentin for 10 days RTC 2 months

## 2015-08-15 NOTE — Progress Notes (Signed)
Pre visit review using our clinic review tool, if applicable. No additional management support is needed unless otherwise documented below in the visit note. 

## 2015-08-15 NOTE — Patient Instructions (Signed)
BEFORE YOU LEAVE THE OFFICE:  GO TO THE LAB  Get the blood work    GO TO THE FRONT DESK  Schedule a routine office visit or check up to be done in  2 months Please be fasting      AFTER YOU LEAVE THE OFFICE: Go back on your routine medications  Diabetes: Check your blood sugar  once a day   Check your blood sugar  at different times of the day  GOALS: Fasting before a meal 70- 130 2 hours after a meal less than 180 At bedtime 90-150 Call if consistently not at goal   Check the  blood pressure 2 or 3 times a month week monthly weekly daily Be sure your blood pressure is between 110/65 and  145/85. If it is consistently higher or lower, let me know

## 2015-08-16 MED ORDER — GLIMEPIRIDE 2 MG PO TABS: 2.0000 mg | ORAL_TABLET | Freq: Every day | ORAL | Status: DC

## 2015-08-16 NOTE — Addendum Note (Signed)
Addended by[place]: Nicolae Vasek D on: 08/16/2015 10:08 AM   Modules accepted: Orders

## 2015-09-12 ENCOUNTER — Telehealth: Payer: Self-pay | Admitting: Internal Medicine

## 2015-09-12 NOTE — Telephone Encounter (Signed)
Was seen recently with a high blood sugar. Please call the patient see how she's doing, recent CBGs?

## 2015-09-12 NOTE — Telephone Encounter (Signed)
LMOM informing Pt to return call regarding recent blood sugar readings.

## 2015-09-16 NOTE — Telephone Encounter (Signed)
Have not received phone call back, letter printed and mailed to Pt asking for call back regarding recent blood sugar readings.

## 2015-10-04 LAB — HM PAP SMEAR

## 2015-10-07 ENCOUNTER — Other Ambulatory Visit: Payer: Self-pay | Admitting: Internal Medicine

## 2015-11-01 LAB — HM MAMMOGRAPHY

## 2015-12-26 HISTORY — PX: TURBINATE REDUCTION: SHX6157

## 2015-12-26 HISTORY — PX: NASAL SEPTUM SURGERY: SHX37

## 2015-12-27 ENCOUNTER — Encounter: Payer: Self-pay | Admitting: Internal Medicine

## 2015-12-30 ENCOUNTER — Ambulatory Visit (INDEPENDENT_AMBULATORY_CARE_PROVIDER_SITE_OTHER): Payer: Managed Care, Other (non HMO) | Admitting: Internal Medicine

## 2015-12-30 ENCOUNTER — Encounter: Payer: Self-pay | Admitting: Internal Medicine

## 2015-12-30 VITALS — BP 116/76 | HR 71 | Temp 98.1°F | Ht 66.0 in | Wt 192.4 lb

## 2015-12-30 DIAGNOSIS — Z23 Encounter for immunization: Secondary | ICD-10-CM | POA: Diagnosis not present

## 2015-12-30 DIAGNOSIS — I10 Essential (primary) hypertension: Secondary | ICD-10-CM

## 2015-12-30 DIAGNOSIS — Z114 Encounter for screening for human immunodeficiency virus [HIV]: Secondary | ICD-10-CM | POA: Diagnosis not present

## 2015-12-30 DIAGNOSIS — E118 Type 2 diabetes mellitus with unspecified complications: Secondary | ICD-10-CM

## 2015-12-30 DIAGNOSIS — Z09 Encounter for follow-up examination after completed treatment for conditions other than malignant neoplasm: Secondary | ICD-10-CM | POA: Insufficient documentation

## 2015-12-30 LAB — HIV ANTIBODY (ROUTINE TESTING W REFLEX): HIV: NONREACTIVE

## 2015-12-30 LAB — BASIC METABOLIC PANEL
BUN: 15 mg/dL (ref 6–23)
CHLORIDE: 103 meq/L (ref 96–112)
CO2: 32 meq/L (ref 19–32)
Calcium: 10.1 mg/dL (ref 8.4–10.5)
Creatinine, Ser: 0.77 mg/dL (ref 0.40–1.20)
GFR: 82.51 mL/min (ref >60.00)
Glucose, Bld: 115 mg/dL — ABNORMAL HIGH (ref 70–99)
Potassium: 4.3 mEq/L (ref 3.5–5.1)
SODIUM: 139 meq/L (ref 135–145)

## 2015-12-30 LAB — HEMOGLOBIN A1C: Hgb A1c MFr Bld: 7.7 % — ABNORMAL HIGH (ref 4.6–6.5)

## 2015-12-30 LAB — MICROALBUMIN / CREATININE URINE RATIO
CREATININE, U: 110.6 mg/dL
MICROALB/CREAT RATIO: 0.6 mg/g (ref 0.0–30.0)
Microalb, Ur: <0.7 mg/dL (ref 0.0–1.9)

## 2015-12-30 MED ORDER — OXYCODONE-ACETAMINOPHEN 5-325 MG PO TABS: 1.0000 | ORAL_TABLET | ORAL | Status: DC | PRN

## 2015-12-30 MED ORDER — BISOPROLOL-HYDROCHLOROTHIAZIDE 2.5-6.25 MG PO TABS: 1.0000 | ORAL_TABLET | Freq: Every day | ORAL | Status: DC

## 2015-12-30 MED ORDER — PIOGLITAZONE HCL 30 MG PO TABS: 30.0000 mg | ORAL_TABLET | Freq: Every day | ORAL | Status: DC

## 2015-12-30 MED ORDER — ESCITALOPRAM OXALATE 20 MG PO TABS: 20.0000 mg | ORAL_TABLET | Freq: Every day | ORAL | Status: DC

## 2015-12-30 MED ORDER — SITAGLIP PHOS-METFORMIN HCL ER 50-1000 MG PO TB24: 2.0000 | ORAL_TABLET | Freq: Every day | ORAL | Status: DC

## 2015-12-30 NOTE — Progress Notes (Signed)
Subjective:    Patient ID: Alexandra Parsons, female    DOB: Mar 07, 1961, 55 y.o.   MRN: [id number]  DOS:  12/30/2015 Type of visit - description : ROV Interval history:  DM: Last A1c elevated, was rec glimepiride but self dc b/c drop her blood sugar too low. Frequently forgets Actos and Janumet. CBGs varies. Depression: Well controlled on Lexapro Pain management: On oxycodone when necessary, recently ENT prescribed Vicodin but she is not taking it due to history of intolerance. Has not taken ergocalciferol as prescribed  Review of Systems  Denies nausea, vomiting, diarrhea Past Medical History  Diagnosis Date  . Hypertension   . Depression   . Diabetes mellitus     type 2  . Menopausal syndrome   . Dyslipidemia   . Abnormal Pap smear of cervix     h/o HPV  . Sarcoid (HCC)     post partum in remission  . Neck pain 05/20/2011  . Chronic sinusitis, chronic cough (?COPD) 07/16/2009  . Leukocytosis 11/19/2011  . Elevated LFTs 11/19/2011  . Submandibular gland swelling     Right    Past Surgical History  Procedure Laterality Date  . Tubal ligation    . Pilondial cyst removal    . Abdominal plasty remotely    . Nasal septum surgery  12/26/2015  . Turbinate reduction Bilateral 12/26/2015    Social History   Social History  . Marital Status: Married    Spouse Name: N/A  . Number of Children: 1  . Years of Education: N/A   Occupational History  . RN [employer]    Social History Main Topics  . Smoking status: Current Every Day Smoker -- .5 years  . Smokeless tobacco: Never Used     Comment:  3/4 ppd  . Alcohol Use: 0.0 oz/week    0 Standard drinks or equivalent per week  . Drug Use: No  . Sexual Activity: Not on file   Other Topics Concern  . Not on file   Social History Narrative   Lost husband, prostate cancer, 08-2012   Lives by herself   Has a daughter , lives in [place]           Medication List       This list is accurate as of: 12/30/15 12:48  PM.  Always use your most recent med list.               amoxicillin-clavulanate 875-125 MG tablet  Commonly known as:  AUGMENTIN  Take 1 tablet by mouth 2 (two) times daily.     bisoprolol-hydrochlorothiazide 2.5-6.25 MG tablet  Commonly known as:  ZIAC  Take 1 tablet by mouth daily.     cholecalciferol 1000 units tablet  Commonly known as:  VITAMIN D  Take 1,000 Units by mouth daily.     escitalopram 20 MG tablet  Commonly known as:  LEXAPRO  Take 1 tablet (20 mg total) by mouth daily.     oxyCODONE-acetaminophen 5-325 MG tablet  Commonly known as:  PERCOCET/ROXICET  Take 1 tablet by mouth every 4 (four) hours as needed.     pioglitazone 30 MG tablet  Commonly known as:  ACTOS  Take 1 tablet (30 mg total) by mouth daily.     SitaGLIPtin-MetFORMIN HCl 50-1000 MG Tb24  Commonly known as:  JANUMET XR  Take 2 tablets by mouth daily.           Objective:   Physical Exam BP 116/76 mmHg  Pulse 71  Temp(Src) 98.1 F (36.7 C) (Oral)  Ht 5\' 6"  (1.676 m)  Wt 192 lb 6 oz (87.261 kg)  BMI 31.07 kg/m2  SpO2 98% General:   Well developed, well nourished . NAD.  HEENT:  Normocephalic . Face symmetric, atraumatic Lungs:  CTA B Normal respiratory effort, no intercostal retractions, no accessory muscle use. Heart: RRR,  no murmur.  No pretibial edema bilaterally  DIABETIC FEET EXAM: No lower extremity edema Normal pedal pulses bilaterally Skin normal, nails normal, no calluses Pinprick examination of the feet normal. Neurologic:  alert & oriented X3.  Speech normal, gait appropriate for age and unassisted Psych--  Cognition and judgment appear intact.  Cooperative with normal attention span and concentration.  Behavior appropriate. No anxious or depressed appearing.      Assessment & Plan:   Assessment DM HTN Dyslipidemia Vit d def  -- Smoker ~ 3/4 ppd Psych: --Depression --ADHD MSK: Neck pain, chronic, on pain meds prn, UDS 09-2013 risk; intolerant to  vicodin, oxycodone ok GYN: --BTL, Menopausal syndrome, Abnormal Pap smear, HPV Elevated LFTs: Hepatitis serology (-) 2008 H/o Sarcoid postpartum in remission H/o Chronic cough: Chronic sinusitis? COPD? H/o Leukocytosis: Her to endocrinology 2014, did not happen H/o +  PPD   PLAN DM: Not taking glimepiride because reported hypoglycemia, frequently forgetting his Actos Janumet, last A1c around 10, risks of uncontrolled diabetes including CAD, stroke, renal failure and blindness discussed. I think she would do well with diet and good compliance with medication. Check labs, encourage compliance. Feet exam negative, encouraged eye exam yearly HTN: Continue Ziac, check a BMP Vitamin D deficiency: Taking ergocalciferol inconsistently, recommend vitamin D OTC daily Primary care:   HIV check, PNM 23 RTC 3 months.

## 2015-12-30 NOTE — Patient Instructions (Signed)
GO TO THE LAB : Get the blood work     GO TO THE FRONT DESK Schedule your next appointment for a  routine checkup in 3 months    

## 2015-12-30 NOTE — Assessment & Plan Note (Signed)
DM: Not taking glimepiride because reported hypoglycemia, frequently forgetting his Actos Janumet, last A1c around 10, risks of uncontrolled diabetes including CAD, stroke, renal failure and blindness discussed. I think she would do well with diet and good compliance with medication. Check labs, encourage compliance. Feet exam negative, encouraged eye exam yearly HTN: Continue Ziac, check a BMP Vitamin D deficiency: Taking ergocalciferol inconsistently, recommend vitamin D OTC daily Primary care:   HIV check, PNM 23 RTC 3 months.

## 2015-12-30 NOTE — Progress Notes (Signed)
Pre visit review using our clinic review tool, if applicable. No additional management support is needed unless otherwise documented below in the visit note. 

## 2016-08-06 ENCOUNTER — Ambulatory Visit (INDEPENDENT_AMBULATORY_CARE_PROVIDER_SITE_OTHER): Payer: Managed Care, Other (non HMO) | Admitting: Internal Medicine

## 2016-08-06 ENCOUNTER — Encounter: Payer: Self-pay | Admitting: Internal Medicine

## 2016-08-06 VITALS — BP 128/74 | HR 80 | Temp 98.0°F | Resp 14 | Ht 66.0 in | Wt 191.4 lb

## 2016-08-06 DIAGNOSIS — Z23 Encounter for immunization: Secondary | ICD-10-CM

## 2016-08-06 DIAGNOSIS — Z0000 Encounter for general adult medical examination without abnormal findings: Secondary | ICD-10-CM | POA: Diagnosis not present

## 2016-08-06 DIAGNOSIS — E119 Type 2 diabetes mellitus without complications: Secondary | ICD-10-CM | POA: Diagnosis not present

## 2016-08-06 DIAGNOSIS — Z09 Encounter for follow-up examination after completed treatment for conditions other than malignant neoplasm: Secondary | ICD-10-CM

## 2016-08-06 LAB — TSH: TSH: 3.12 u[IU]/mL (ref 0.35–4.50)

## 2016-08-06 LAB — LIPID PANEL
CHOLESTEROL: 149 mg/dL (ref 0–200)
HDL: 25 mg/dL — ABNORMAL LOW (ref >39.00)
LDL CALC: 97 mg/dL (ref 0–99)
NonHDL: 123.56
Total CHOL/HDL Ratio: 6
Triglycerides: 131 mg/dL (ref 0.0–149.0)
VLDL: 26.2 mg/dL (ref 0.0–40.0)

## 2016-08-06 LAB — BASIC METABOLIC PANEL
BUN: 17 mg/dL (ref 6–23)
CALCIUM: 9.6 mg/dL (ref 8.4–10.5)
CO2: 31 mEq/L (ref 19–32)
Chloride: 101 mEq/L (ref 96–112)
Creatinine, Ser: 0.77 mg/dL (ref 0.40–1.20)
GFR: 82.33 mL/min (ref >60.00)
Glucose, Bld: 188 mg/dL — ABNORMAL HIGH (ref 70–99)
Potassium: 3.6 mEq/L (ref 3.5–5.1)
SODIUM: 137 meq/L (ref 135–145)

## 2016-08-06 LAB — ALT: ALT: 18 U/L (ref 0–35)

## 2016-08-06 LAB — HEMOGLOBIN A1C: HEMOGLOBIN A1C: 8.7 % — AB (ref 4.6–6.5)

## 2016-08-06 LAB — AST: AST: 17 U/L (ref 0–37)

## 2016-08-06 MED ORDER — PIOGLITAZONE HCL 30 MG PO TABS: 30.0000 mg | ORAL_TABLET | Freq: Every day | ORAL | 3 refills | Status: DC

## 2016-08-06 MED ORDER — OXYCODONE-ACETAMINOPHEN 5-325 MG PO TABS: 1.0000 | ORAL_TABLET | ORAL | 0 refills | Status: DC | PRN

## 2016-08-06 MED ORDER — METFORMIN HCL 500 MG PO TABS: 500.0000 mg | ORAL_TABLET | Freq: Two times a day (BID) | ORAL | 6 refills | Status: DC

## 2016-08-06 MED ORDER — BISOPROLOL-HYDROCHLOROTHIAZIDE 2.5-6.25 MG PO TABS: ORAL_TABLET | ORAL | 3 refills | Status: DC

## 2016-08-06 MED ORDER — SITAGLIPTIN PHOSPHATE 100 MG PO TABS: 100.0000 mg | ORAL_TABLET | Freq: Every day | ORAL | 6 refills | Status: DC

## 2016-08-06 NOTE — Progress Notes (Addendum)
Subjective:    Patient ID: Alexandra KinsmanJulie W Kakos, female    DOB: 09/26/1960, 55 y.o.   MRN: [id number]  DOS:  08/06/2016 Type of visit - description : CPX Interval history: We also manage her chronic medical problems   Review of Systems  Constitutional: No fever. No chills. No unexplained wt changes. No unusual sweats  HEENT: No dental problems, no ear discharge, no facial swelling, no voice changes. No eye discharge, no eye  redness , no  intolerance to light   Respiratory: No wheezing , no  difficulty breathing. No cough , no mucus production  Cardiovascular: No CP, no leg swelling , no  Palpitations  GI: no nausea, no vomiting, no diarrhea , no  abdominal pain.  No blood in the stools. No dysphagia, no odynophagia    Endocrine: No polyphagia, no polyuria , no polydipsia  GU: No dysuria, gross hematuria, difficulty urinating. No urinary urgency, no frequency.  Musculoskeletal: No joint swellings or unusual aches or pains  Skin: No change in the color of the skin, palor , no  Rash  Allergic, immunologic: No environmental allergies , no  food allergies  Neurological: No dizziness no  syncope. No headaches. No diplopia, no slurred, no slurred speech, no motor deficits, no facial  Numbness  Hematological: No enlarged lymph nodes, no easy bruising , no unusual bleedings  Psychiatry: No suicidal ideas, no hallucinations, no beavior problems, no confusion.  No unusual/severe anxiety, no depression   Past Medical History:  Diagnosis Date  . Abnormal Pap smear of cervix    h/o HPV  . Chronic sinusitis, chronic cough (?COPD) 07/16/2009  . Depression   . Diabetes mellitus    type 2  . Dyslipidemia   . Elevated LFTs 11/19/2011  . Hypertension   . Leukocytosis 11/19/2011  . Menopausal syndrome   . Neck pain 05/20/2011  . Sarcoid (HCC)    post partum in remission  . Submandibular gland swelling    Right    Past Surgical History:  Procedure Laterality Date  . abdominal  plasty remotely    . NASAL SEPTUM SURGERY  12/26/2015  . pilondial cyst removal    . TUBAL LIGATION    . TURBINATE REDUCTION Bilateral 12/26/2015    Social History   Social History  . Marital status: Married    Spouse name: N/A  . Number of children: 1  . Years of education: N/A   Occupational History  . [employer]    Social History Main Topics  . Smoking status: Current Every Day Smoker    Years: 0.50  . Smokeless tobacco: Never Used     Comment:  3/4 ppd  . Alcohol use 0.0 oz/week  . Drug use: No  . Sexual activity: Not on file   Other Topics Concern  . Not on file   Social History Narrative   Lost husband, prostate cancer, 08-2012   Lives by herself   Has a daughter , [place]        Family History  Problem Relation Age of Onset  . Hypertension Other     GM  . Colon cancer Other     COLON CA GGM x 2  . CAD Other     GF at age 56  . Diabetes Other     aunt  . Stroke Neg Hx   . Breast cancer Neg Hx      Allergies as of 08/06/2016      Reactions  Erythromycin    REACTION: palpitations; ZPACK is ok   Fluoxetine Hcl    REACTION: hallucinations   Venlafaxine    REACTION: made her feel bad      Medication List       Accurate as of 08/06/16 11:06 AM. Always use your most recent med list.          amoxicillin-clavulanate 875-125 MG tablet Commonly known as:  AUGMENTIN Take 1 tablet by mouth 2 (two) times daily.   bisoprolol-hydrochlorothiazide 2.5-6.25 MG tablet Commonly known as:  ZIAC Take 1 tablet by mouth daily.   cholecalciferol 1000 units tablet Commonly known as:  VITAMIN D Take 1,000 Units by mouth daily.   escitalopram 20 MG tablet Commonly known as:  LEXAPRO Take 1 tablet (20 mg total) by mouth daily.   oxyCODONE-acetaminophen 5-325 MG tablet Commonly known as:  PERCOCET/ROXICET Take 1 tablet by mouth every 4 (four) hours as needed.   pioglitazone 30 MG tablet Commonly known as:  ACTOS Take 1 tablet (30  mg total) by mouth daily.   SitaGLIPtin-MetFORMIN HCl 50-1000 MG Tb24 Commonly known as:  JANUMET XR Take 2 tablets by mouth daily.          Objective:   Physical Exam BP 128/74 (BP Location: Left Arm, Patient Position: Sitting, Cuff Size: Normal)   Pulse 80   Temp 98 F (36.7 C) (Oral)   Resp 14   Ht 5\' 6"  (1.676 m)   Wt 191 lb 6 oz (86.8 kg)   SpO2 98%   BMI 30.89 kg/m   General:   Well developed, well nourished . NAD.  Neck: No  thyromegaly  HEENT:  Normocephalic . Face symmetric, atraumatic Lungs:  CTA B Normal respiratory effort, no intercostal retractions, no accessory muscle use. Heart: RRR,  no murmur.  No pretibial edema bilaterally  Abdomen:  Not distended, soft, non-tender. No rebound or rigidity.   Skin: Exposed areas without rash. Not pale. Not jaundice Neurologic:  alert & oriented X3.  Speech normal, gait appropriate for age and unassisted Strength symmetric and appropriate for age.  Psych: Cognition and judgment appear intact.  Cooperative with normal attention span and concentration.  Behavior appropriate. No anxious or depressed appearing.    Assessment & Plan:   Assessment DM HTN Dyslipidemia Vit d def  -- Smoker ~1  ppd Psych: --Depression --ADHD MSK: Neck pain, chronic, on pain meds prn, UDS 09-2013 risk; intolerant to vicodin, oxycodone ok GYN: --BTL, Menopausal syndrome, Abnormal Pap smear, HPV Elevated LFTs: Hepatitis serology (-) 2008 H/o Sarcoid postpartum in remission H/o Chronic cough: Chronic sinusitis? COPD? H/o Leukocytosis: Her to endocrinology 2014, did not happen H/o +  PPD   PLAN DM: Not taking Actos ( no reason). Takes Janumet sometimes, thinks metformin 1000 mg bid causes stomach upset. Plan: Januvia 100 mg daily, try low dose of metformin 500 mg twice a day, encouraged to take Actos. She is doing okay with diet and exercise. Recommend eye exam HTN: Not taking Ziac for the last month, for no particular reason.  Eventually after we discussed the issue she decided to take half tablet daily. Will monitor BPs. Dyslipidemia: Check labs Depression: controlled, on lexapro Vitamin D deficiency:   labs, encouraged to take supplements qd  RTC 3 months

## 2016-08-06 NOTE — Patient Instructions (Addendum)
GO TO THE LAB : Get the blood work     GO TO THE FRONT DESK Schedule your next appointment for a  checkup in 3 months  Take all the medications as prescribed  Take OTC vitamin D 1000 units every day  ASA 81 mg every day  Please see your eye doctor

## 2016-08-06 NOTE — Progress Notes (Signed)
Pre visit review using our clinic review tool, if applicable. No additional management support is needed unless otherwise documented below in the visit note. 

## 2016-08-06 NOTE — Assessment & Plan Note (Addendum)
Td 2008; pnm 23-- 2017; no prevnar per guidelines; flu shot today Zostavax discussed before   03-2009  Cscope neg, next in 10 years   Sees gyn; per [facility]-- PAP done 09-2015, MMG done 10-2015  DEXA was ordered but not done. Reassess in the future  Diet-exercise - tobacco---discussed

## 2016-08-07 NOTE — Assessment & Plan Note (Addendum)
DM: Not taking Actos ( no reason). Takes Janumet sometimes, thinks metformin 1000 mg bid causes stomach upset. Plan: Januvia 100 mg daily, try low dose of metformin 500 mg twice a day, encouraged to take Actos. She is doing okay with diet and exercise. Recommend eye exam HTN: Not taking Ziac for the last month, for no particular reason. Eventually after we discussed the issue she decided to take half tablet daily. Will monitor BPs. Dyslipidemia: Check labs Depression: controlled, on lexapro Vitamin D deficiency:   labs, encouraged to take supplements qd  RTC 3 months

## 2016-08-08 LAB — VITAMIN D 1,25 DIHYDROXY
VITAMIN D 1, 25 (OH) TOTAL: 36 pg/mL (ref 18–72)
VITAMIN D2 1, 25 (OH): 15 pg/mL
VITAMIN D3 1, 25 (OH): 21 pg/mL

## 2016-10-04 ENCOUNTER — Other Ambulatory Visit: Payer: Self-pay | Admitting: Internal Medicine

## 2016-11-03 ENCOUNTER — Encounter: Payer: Self-pay | Admitting: Internal Medicine

## 2016-11-03 ENCOUNTER — Ambulatory Visit (INDEPENDENT_AMBULATORY_CARE_PROVIDER_SITE_OTHER): Payer: Managed Care, Other (non HMO) | Admitting: Internal Medicine

## 2016-11-03 VITALS — BP 134/84 | HR 82 | Temp 98.1°F | Resp 14 | Ht 66.0 in | Wt 191.4 lb

## 2016-11-03 DIAGNOSIS — M542 Cervicalgia: Secondary | ICD-10-CM

## 2016-11-03 DIAGNOSIS — I10 Essential (primary) hypertension: Secondary | ICD-10-CM | POA: Diagnosis not present

## 2016-11-03 DIAGNOSIS — E119 Type 2 diabetes mellitus without complications: Secondary | ICD-10-CM

## 2016-11-03 LAB — HEMOGLOBIN A1C: Hgb A1c MFr Bld: 7.8 % — ABNORMAL HIGH (ref 4.6–6.5)

## 2016-11-03 MED ORDER — OXYCODONE-ACETAMINOPHEN 5-325 MG PO TABS: 1.0000 | ORAL_TABLET | ORAL | 0 refills | Status: DC | PRN

## 2016-11-03 MED ORDER — METFORMIN HCL 1000 MG PO TABS: 1000.0000 mg | ORAL_TABLET | Freq: Two times a day (BID) | ORAL | 3 refills | Status: DC

## 2016-11-03 NOTE — Patient Instructions (Addendum)
GO TO THE LAB : Get the blood work and a urine sampke  GO TO THE FRONT DESK Schedule your next appointment for a  Check up in 3 months      Check the  blood pressure   daily Be sure your blood pressure is between 110/65 and  145/85. If it is consistently higher or lower, let me know

## 2016-11-03 NOTE — Progress Notes (Signed)
Subjective:    Patient ID: Alexandra KinsmanJulie W Parsons, female    DOB: 12/27/1960, 56 y.o.   MRN: [id number]  DOS:  11/03/2016 Type of visit - description : Follow-up Interval history: DM: Taking Actos daily, also taking Janumet 50-1000 mg 1 tablet daily, sometimes extra Januvia, sometimes an extra metformin HTN: BP sometimes in the low side, 80/40 per pt, had to skip meds.  Review of Systems Ambulatory CBGs in the 140s. No low blood sugar symptoms  Past Medical History:  Diagnosis Date  . Abnormal Pap smear of cervix    h/o HPV  . Chronic sinusitis, chronic cough (?COPD) 07/16/2009  . Depression   . Diabetes mellitus    type 2  . Dyslipidemia   . Elevated LFTs 11/19/2011  . Hypertension   . Leukocytosis 11/19/2011  . Menopausal syndrome   . Neck pain 05/20/2011  . Sarcoid (HCC)    post partum in remission  . Submandibular gland swelling    Right    Past Surgical History:  Procedure Laterality Date  . abdominal plasty remotely    . NASAL SEPTUM SURGERY  12/26/2015  . pilondial cyst removal    . TUBAL LIGATION    . TURBINATE REDUCTION Bilateral 12/26/2015    Social History   Social History  . Marital status: Widowed    Spouse name: N/A  . Number of children: 1  . Years of education: N/A   Occupational History  . [employer]    Social History Main Topics  . Smoking status: Current Every Day Smoker    Years: 0.50  . Smokeless tobacco: Never Used     Comment: ~ 1 ppd   . Alcohol use 0.0 oz/week  . Drug use: No  . Sexual activity: Not on file   Other Topics Concern  . Not on file   Social History Narrative   Lost husband, prostate cancer, 08-2012   Lives by herself   Has a daughter , [place] in [place]         Allergies as of 11/03/2016      Reactions   Erythromycin    REACTION: palpitations; ZPACK is ok   Fluoxetine Hcl    REACTION: hallucinations   Venlafaxine    REACTION: made her feel bad      Medication List       Accurate as of 11/03/16   5:02 PM. Always use your most recent med list.          aspirin EC 81 MG tablet Take 81 mg by mouth daily.   bisoprolol-hydrochlorothiazide 2.5-6.25 MG tablet Commonly known as:  ZIAC Take 1/2 tablet by mouth daily   cholecalciferol 1000 units tablet Commonly known as:  VITAMIN D Take 1,000 Units by mouth daily.   escitalopram 20 MG tablet Commonly known as:  LEXAPRO Take 1 tablet (20 mg total) by mouth daily.   metFORMIN 1000 MG tablet Commonly known as:  GLUCOPHAGE Take 1 tablet (1,000 mg total) by mouth 2 (two) times daily with a meal.   oxyCODONE-acetaminophen 5-325 MG tablet Commonly known as:  PERCOCET/ROXICET Take 1 tablet by mouth every 4 (four) hours as needed.   pioglitazone 30 MG tablet Commonly known as:  ACTOS Take 1 tablet (30 mg total) by mouth daily.   sitaGLIPtin 100 MG tablet Commonly known as:  JANUVIA Take 1 tablet (100 mg total) by mouth daily.          Objective:   Physical Exam BP 134/84 (BP  Location: Left Arm, Patient Position: Sitting, Cuff Size: Normal)   Pulse 82   Temp 98.1 F (36.7 C) (Oral)   Resp 14   Ht 5\' 6"  (1.676 m)   Wt 191 lb 6 oz (86.8 kg)   SpO2 97%   BMI 30.89 kg/m  General:   Well developed, well nourished . NAD.  HEENT:  Normocephalic . Face symmetric, atraumatic Lungs:  CTA B Normal respiratory effort, no intercostal retractions, no accessory muscle use. Heart: RRR,  no murmur.  No pretibial edema bilaterally  Skin: Not pale. Not jaundice Neurologic:  alert & oriented X3.  Speech normal, gait appropriate for age and unassisted Psych--  Cognition and judgment appear intact.  Cooperative with normal attention span and concentration.  Behavior appropriate. No anxious or depressed appearing.      Assessment & Plan:   Assessment DM HTN Dyslipidemia Vit d def  -- Smoker ~1  ppd Psych: --Depression --ADHD MSK: Neck pain, chronic, on pain meds prn, UDS 09-2013 risk; intolerant to vicodin, oxycodone  ok GYN: --BTL, Menopausal syndrome, Abnormal Pap smear, HPV Elevated LFTs: Hepatitis serology (-) 2008 H/o Sarcoid postpartum in remission H/o Chronic cough: Chronic sinusitis? COPD? H/o Leukocytosis: Her to endocrinology 2014, did not happen H/o +  PPD   PLAN DM: Good compliance with Actos daily. Currently taking Janumet 50-1000 mg 1 tablet daily, occasionally takes a Januvia 100 mg and some metformin thus current dose unclear Plan: Check A1c, moving forward will do januvia 100 mg qd, metformin 1000 bid and actos  HTN: BP today is very good, reports sometimes BPs are low thus  takes ziac sporadically ; advised to change to bisoprolol only, she is quite reluctant, we agreed that she will continue taking Ziac 1/2 tablet and monitor her BP closely. Neck pain, chronic, UDS and contract today. RX RF RTC 3 months

## 2016-11-03 NOTE — Assessment & Plan Note (Signed)
DM: Good compliance with Actos daily. Currently taking Janumet 50-1000 mg 1 tablet daily, occasionally takes a Januvia 100 mg and some metformin thus current dose unclear Plan: Check A1c, moving forward will do januvia 100 mg qd, metformin 1000 bid and actos  HTN: BP today is very good, reports sometimes BPs are low thus  takes ziac sporadically ; advised to change to bisoprolol only, she is quite reluctant, we agreed that she will continue taking Ziac 1/2 tablet and monitor her BP closely. Neck pain, chronic, UDS and contract today. RX RF RTC 3 months

## 2016-11-03 NOTE — Progress Notes (Signed)
Pre visit review using our clinic review tool, if applicable. No additional management support is needed unless otherwise documented below in the visit note. 

## 2016-11-16 ENCOUNTER — Telehealth: Payer: Self-pay

## 2016-11-16 NOTE — Telephone Encounter (Signed)
UDS: 11/03/2016  Negative for Oxycodone: PRN  Low risk per PCP 11/11/2016

## 2016-12-29 ENCOUNTER — Other Ambulatory Visit: Payer: Self-pay | Admitting: Internal Medicine

## 2017-02-12 ENCOUNTER — Ambulatory Visit: Payer: Managed Care, Other (non HMO) | Admitting: Internal Medicine

## 2017-06-18 ENCOUNTER — Other Ambulatory Visit: Payer: Self-pay | Admitting: Internal Medicine

## 2017-08-25 ENCOUNTER — Ambulatory Visit (INDEPENDENT_AMBULATORY_CARE_PROVIDER_SITE_OTHER): Payer: Managed Care, Other (non HMO) | Admitting: Internal Medicine

## 2017-08-25 ENCOUNTER — Encounter: Payer: Self-pay | Admitting: Internal Medicine

## 2017-08-25 VITALS — BP 130/86 | HR 74 | Temp 97.9°F | Resp 16 | Ht 66.5 in | Wt 191.6 lb

## 2017-08-25 DIAGNOSIS — M791 Myalgia, unspecified site: Secondary | ICD-10-CM

## 2017-08-25 DIAGNOSIS — Z79899 Other long term (current) drug therapy: Secondary | ICD-10-CM

## 2017-08-25 DIAGNOSIS — Z780 Asymptomatic menopausal state: Secondary | ICD-10-CM

## 2017-08-25 DIAGNOSIS — Z23 Encounter for immunization: Secondary | ICD-10-CM | POA: Diagnosis not present

## 2017-08-25 DIAGNOSIS — E119 Type 2 diabetes mellitus without complications: Secondary | ICD-10-CM | POA: Diagnosis not present

## 2017-08-25 DIAGNOSIS — Z0000 Encounter for general adult medical examination without abnormal findings: Secondary | ICD-10-CM

## 2017-08-25 DIAGNOSIS — E559 Vitamin D deficiency, unspecified: Secondary | ICD-10-CM

## 2017-08-25 DIAGNOSIS — M542 Cervicalgia: Secondary | ICD-10-CM

## 2017-08-25 LAB — LIPID PANEL
Cholesterol: 172 mg/dL (ref 0–200)
HDL: 24.7 mg/dL — ABNORMAL LOW (ref >39.00)
LDL Cholesterol: 115 mg/dL — ABNORMAL HIGH (ref 0–99)
NONHDL: 147.69
Total CHOL/HDL Ratio: 7
Triglycerides: 161 mg/dL — ABNORMAL HIGH (ref 0.0–149.0)
VLDL: 32.2 mg/dL (ref 0.0–40.0)

## 2017-08-25 LAB — COMPREHENSIVE METABOLIC PANEL
ALK PHOS: 62 U/L (ref 39–117)
ALT: 13 U/L (ref 0–35)
AST: 18 U/L (ref 0–37)
Albumin: 4.1 g/dL (ref 3.5–5.2)
BILIRUBIN TOTAL: 0.5 mg/dL (ref 0.2–1.2)
BUN: 18 mg/dL (ref 6–23)
CO2: 30 mEq/L (ref 19–32)
CREATININE: 1.11 mg/dL (ref 0.40–1.20)
Calcium: 9.4 mg/dL (ref 8.4–10.5)
Chloride: 106 mEq/L (ref 96–112)
GFR: 53.78 mL/min — ABNORMAL LOW (ref >60.00)
GLUCOSE: 136 mg/dL — AB (ref 70–99)
Potassium: 4.2 mEq/L (ref 3.5–5.1)
Sodium: 143 mEq/L (ref 135–145)
TOTAL PROTEIN: 8 g/dL (ref 6.0–8.3)

## 2017-08-25 LAB — CBC WITH DIFFERENTIAL/PLATELET
BASOS ABS: 0 10*3/uL (ref 0.0–0.1)
Basophils Relative: 0.3 % (ref 0.0–3.0)
EOS PCT: 1.3 % (ref 0.0–5.0)
Eosinophils Absolute: 0.1 10*3/uL (ref 0.0–0.7)
HEMATOCRIT: 40.5 % (ref 36.0–46.0)
HEMOGLOBIN: 13.6 g/dL (ref 12.0–15.0)
LYMPHS PCT: 33 % (ref 12.0–46.0)
Lymphs Abs: 3.3 10*3/uL (ref 0.7–4.0)
MCHC: 33.5 g/dL (ref 30.0–36.0)
MCV: 95 fl (ref 78.0–100.0)
MONOS PCT: 6.8 % (ref 3.0–12.0)
Monocytes Absolute: 0.7 10*3/uL (ref 0.1–1.0)
Neutro Abs: 5.9 10*3/uL (ref 1.4–7.7)
Neutrophils Relative %: 58.6 % (ref 43.0–77.0)
Platelets: 226 10*3/uL (ref 150.0–400.0)
RBC: 4.27 Mil/uL (ref 3.87–5.11)
RDW: 13.9 % (ref 11.5–15.5)
WBC: 10.1 10*3/uL (ref 4.0–10.5)

## 2017-08-25 LAB — SEDIMENTATION RATE: Sed Rate: 17 mm/hr (ref 0–30)

## 2017-08-25 LAB — CK: Total CK: 76 U/L (ref 7–177)

## 2017-08-25 LAB — HEMOGLOBIN A1C: Hgb A1c MFr Bld: 7.6 % — ABNORMAL HIGH (ref 4.6–6.5)

## 2017-08-25 LAB — TSH: TSH: 2.95 u[IU]/mL (ref 0.35–4.50)

## 2017-08-25 LAB — HIGH SENSITIVITY CRP: CRP, High Sensitivity: 6.5 mg/L — ABNORMAL HIGH (ref 0.000–5.000)

## 2017-08-25 MED ORDER — OXYCODONE-ACETAMINOPHEN 5-325 MG PO TABS: 1.0000 | ORAL_TABLET | ORAL | 0 refills | Status: DC | PRN

## 2017-08-25 NOTE — Patient Instructions (Addendum)
GO TO THE LAB : Get the blood work     GO TO THE FRONT DESK Schedule your next appointment for a checkup in 3 months   Steps to Quit Smoking Smoking tobacco can be harmful to your health and can affect almost every organ in your body. Smoking puts you, and those around you, at risk for developing many serious chronic diseases. Quitting smoking is difficult, but it is one of the best things that you can do for your health. It is never too late to quit. What are the benefits of quitting smoking? When you quit smoking, you lower your risk of developing serious diseases and conditions, such as:  Lung cancer or lung disease, such as COPD.  Heart disease.  Stroke.  Heart attack.  Infertility.  Osteoporosis and bone fractures.  Additionally, symptoms such as coughing, wheezing, and shortness of breath may get better when you quit. You may also find that you get sick less often because your body is stronger at fighting off colds and infections. If you are pregnant, quitting smoking can help to reduce your chances of having a baby of low birth weight. How do I get ready to quit? When you decide to quit smoking, create a plan to make sure that you are successful. Before you quit:  Pick a date to quit. Set a date within the next two weeks to give you time to prepare.  Write down the reasons why you are quitting. Keep this list in places where you will see it often, such as on your bathroom mirror or in your car or wallet.  Identify the people, places, things, and activities that make you want to smoke (triggers) and avoid them. Make sure to take these actions: ? Throw away all cigarettes at home, at work, and in your car. ? Throw away smoking accessories, such as Set designer. ? Clean your car and make sure to empty the ashtray. ? Clean your home, including curtains and carpets.  Tell your family, friends, and coworkers that you are quitting. Support from your loved ones can make  quitting easier.  Talk with your health care provider about your options for quitting smoking.  Find out what treatment options are covered by your health insurance.  What strategies can I use to quit smoking? Talk with your healthcare provider about different strategies to quit smoking. Some strategies include:  Quitting smoking altogether instead of gradually lessening how much you smoke over a period of time. Research shows that quitting "cold [place]" is more successful than gradually quitting.  Attending in-person counseling to help you build problem-solving skills. You are more likely to have success in quitting if you attend several counseling sessions. Even short sessions of 10 minutes can be effective.  Finding resources and support systems that can help you to quit smoking and remain smoke-free after you quit. These resources are most helpful when you use them often. They can include: ? Online chats with a Veterinary surgeon. ? Telephone quitlines. ? Automotive engineer. ? Support groups or group counseling. ? Text messaging programs. ? Mobile phone applications.  Taking medicines to help you quit smoking. (If you are pregnant or breastfeeding, talk with your health care provider first.) Some medicines contain nicotine and some do not. Both types of medicines help with cravings, but the medicines that include nicotine help to relieve withdrawal symptoms. Your health care provider may recommend: ? Nicotine patches, gum, or lozenges. ? Nicotine inhalers or sprays. ? Non-nicotine medicine that  is taken by mouth.  Talk with your health care provider about combining strategies, such as taking medicines while you are also receiving in-person counseling. Using these two strategies together makes you more likely to succeed in quitting than if you used either strategy on its own. If you are pregnant or breastfeeding, talk with your health care provider about finding counseling or other  support strategies to quit smoking. Do not take medicine to help you quit smoking unless told to do so by your health care provider. What things can I do to make it easier to quit? Quitting smoking might feel overwhelming at first, but there is a lot that you can do to make it easier. Take these important actions:  Reach out to your family and friends and ask that they support and encourage you during this time. Call telephone quitlines, reach out to support groups, or work with a counselor for support.  Ask people who smoke to avoid smoking around you.  Avoid places that trigger you to smoke, such as bars, parties, or smoke-break areas at work.  Spend time around people who do not smoke.  Lessen stress in your life, because stress can be a smoking trigger for some people. To lessen stress, try: ? Exercising regularly. ? Deep-breathing exercises. ? Yoga. ? Meditating. ? Performing a body scan. This involves closing your eyes, scanning your body from head to toe, and noticing which parts of your body are particularly tense. Purposefully relax the muscles in those areas.  Download or purchase mobile phone or tablet apps (applications) that can help you stick to your quit plan by providing reminders, tips, and encouragement. There are many free apps, such as QuitGuide from the [employer] developer(Centers for Disease Control and Prevention). You can find other support for quitting smoking (smoking cessation) through smokefree.gov and other websites.  How will I feel when I quit smoking? Within the first 24 hours of quitting smoking, you may start to feel some withdrawal symptoms. These symptoms are usually most noticeable 2-3 days after quitting, but they usually do not last beyond 2-3 weeks. Changes or symptoms that you might experience include:  Mood swings.  Restlessness, anxiety, or irritation.  Difficulty concentrating.  Dizziness.  Strong cravings for sugary foods in addition to nicotine.  Mild  weight gain.  Constipation.  Nausea.  Coughing or a sore throat.  Changes in how your medicines work in your body.  A depressed mood.  Difficulty sleeping (insomnia).  After the first 2-3 weeks of quitting, you may start to notice more positive results, such as:  Improved sense of smell and taste.  Decreased coughing and sore throat.  Slower heart rate.  Lower blood pressure.  Clearer skin.  The ability to breathe more easily.  Fewer sick days.  Quitting smoking is very challenging for most people. Do not get discouraged if you are not successful the first time. Some people need to make many attempts to quit before they achieve long-term success. Do your best to stick to your quit plan, and talk with your health care provider if you have any questions or concerns. This information is not intended to replace advice given to you by your health care provider. Make sure you discuss any questions you have with your health care provider. Document Released: 07/21/2001 Document Revised: 03/24/2016 Document Reviewed: 12/11/2014 Elsevier Interactive Patient Education  [employer]2018 Elsevier Inc.

## 2017-08-25 NOTE — Progress Notes (Signed)
Pre visit review using our clinic review tool, if applicable. No additional management support is needed unless otherwise documented below in the visit note. 

## 2017-08-25 NOTE — Progress Notes (Signed)
Subjective:    Patient ID: Alexandra Parsons, female    DOB: [birth year], 57 y.o.   MRN: [id number]  DOS:  08/25/2017 Type of visit - description : cpx Interval history: Here for CPX, last visit 10-2016, I also manage her chronic medical problems today.   Review of Systems Medication compliance reviewed. Reports long history of aches all over, seems to be slightly worse, hurts at both joint and muscle aches, sx mild but persistent. No fever, chills, weight loss.  She also reports chronic headaches, sometimes over the sinuses, sometimes global headaches, "like a helmet". Some depression daily, her daughter was living with her but she just moved out last week.  Also feels overworked.  Denies suicidal ideas.   Other than above, a 14 point review of systems is negative     Past Medical History:  Diagnosis Date  . Abnormal Pap smear of cervix    h/o HPV  . Chronic sinusitis, chronic cough (?COPD) 07/16/2009  . Depression   . Diabetes mellitus    type 2  . Dyslipidemia   . Elevated LFTs 11/19/2011  . Hypertension   . Leukocytosis 11/19/2011  . Menopausal syndrome   . Neck pain 05/20/2011  . Sarcoid    post partum in remission  . Submandibular gland swelling    Right    Past Surgical History:  Procedure Laterality Date  . abdominal plasty remotely    . NASAL SEPTUM SURGERY  12/26/2015  . pilondial cyst removal    . TUBAL LIGATION    . TURBINATE REDUCTION Bilateral 12/26/2015    Social History   Socioeconomic History  . Marital status: Widowed    Spouse name: Not on file  . Number of children: 1  . Years of education: Not on file  . Highest education level: Not on file  Social Needs  . Financial resource strain: Not on file  . Food insecurity - worry: Not on file  . Food insecurity - inability: Not on file  . Transportation needs - medical: Not on file  . Transportation needs - non-medical: Not on file  Occupational History  . 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 Smoking status: Current Every Day Smoker    Years: 0.50  . Smokeless tobacco: Never Used  . Tobacco comment: ~ 3/4 ppd  Substance and Sexual Activity  . Alcohol use: Yes    Alcohol/week: 0.0 oz    Comment: rare  . Drug use: No  . Sexual activity: Not on file  Other Topics Concern  . Not on file  Social History Narrative   Lost husband, prostate cancer, 08-2012   Lives by herself   Has a daughter , lives in [place]     Family History  Problem Relation Age of Onset  . Hypertension Other        GM  . Colon cancer Other        COLON CA GGM x 2  . CAD Other        GF at age 41  . Diabetes Other        aunt  . Stroke Neg Hx   . Breast cancer Neg Hx      Allergies as of 08/25/2017      Reactions   Erythromycin    REACTION: palpitations; ZPACK is ok   Fluoxetine Hcl    REACTION: hallucinations   Venlafaxine    REACTION: made her feel bad  Medication List        Accurate as of 08/25/17 11:59 PM. Always use your most recent med list.          aspirin EC 81 MG tablet Take 81 mg by mouth daily.   bisoprolol-hydrochlorothiazide 2.5-6.25 MG tablet Commonly known as:  ZIAC Take 0.5 tablets daily by mouth.   cholecalciferol 1000 units tablet Commonly known as:  VITAMIN D Take 1,000 Units by mouth daily.   escitalopram 20 MG tablet Commonly known as:  LEXAPRO Take 1 tablet (20 mg total) by mouth daily.   metFORMIN 1000 MG tablet Commonly known as:  GLUCOPHAGE Take 1 tablet (1,000 mg total) by mouth 2 (two) times daily with a meal.   oxyCODONE-acetaminophen 5-325 MG tablet Commonly known as:  PERCOCET/ROXICET Take 1 tablet by mouth every 4 (four) hours as needed.   pioglitazone 30 MG tablet Commonly known as:  ACTOS Take 1 tablet (30 mg total) by mouth daily.   sitaGLIPtin 100 MG tablet Commonly known as:  JANUVIA Take 1 tablet (100 mg total) by mouth daily.          Objective:   Physical Exam BP 130/86 (BP Location: Left Arm, Cuff  Size: Large)   Pulse 74   Temp 97.9 F (36.6 C) (Oral)   Resp 16   Ht 5' 6.5" (1.689 m)   Wt 191 lb 9.6 oz (86.9 kg)   SpO2 98%   BMI 30.46 kg/m  General:   Well developed, well nourished . NAD.  Neck: No  thyromegaly  HEENT:  Normocephalic . Face symmetric, atraumatic Lungs:  CTA B Normal respiratory effort, no intercostal retractions, no accessory muscle use. Heart: RRR,  no murmur.  No pretibial edema bilaterally  Abdomen:  Not distended, soft, non-tender. No rebound or rigidity.   MSK: Hands and wrists without synovitis Skin: Exposed areas without rash. Not pale. Not jaundice Neurologic:  alert & oriented X3.  Speech normal, gait appropriate for age and unassisted Strength symmetric and appropriate for age.  Psych: Cognition and judgment appear intact.  Cooperative with normal attention span and concentration.  Behavior appropriate. No anxious or depressed appearing.     Assessment & Plan:    Assessment DM HTN Dyslipidemia Vit d def  -- Smoker ~1  ppd Psych: --Depression --ADHD MSK: Neck pain, chronic, on pain meds prn, UDS 09-2013 risk; intolerant to vicodin, oxycodone ok GYN: --BTL, Menopausal , h/o  Abnormal Pap smear, HPV Elevated LFTs: Hepatitis serology (-) 2008 H/o Sarcoid postpartum in remission H/o Chronic cough: Chronic sinusitis? COPD? H/o Leukocytosis  H/o +  PPD   PLAN DM: On Actos, Januvia and metformin (usually 1500 mg daily).  Check A1c. HTN: On Ziac, ambulatory BP is 120/80 Dyslipidemia: Diet controlled, checking labs Vitamin D deficiency: Checking labs Depression: Slightly worse, no suicidal ideas, continue with Lexapro.  Reassess in 3 months Generalized pain: As described above, h/o remote sarcoid. Check a sed rate, CK, CRP, RF, ANA.  Does have a headache but not far from her baseline. Pain management: UDS and contract today Encouraged to keep her F/Us, we need to see her more often to be effective taking care of her RTC 3 months.

## 2017-08-25 NOTE — Assessment & Plan Note (Addendum)
-  Td 08/2017; pnm 23-- 2017; no prevnar per guidelines; flu shot today Zostavax discussed before  -- CCS: 03-2009  Cscope neg, next in 10 years  --female care : no KPN available today, due to see gyn, encouraged to call   --DEXA was ordered but not done. Re order   --Diet-exercise - tobacco---discussed  --Labs: CMP, FLP, CBC, A1c, TSH ( fasting for 6 hours)

## 2017-08-26 NOTE — Assessment & Plan Note (Signed)
DM: On Actos, Januvia and metformin (usually 1500 mg daily).  Check A1c. HTN: On Ziac, ambulatory BP is 120/80 Dyslipidemia: Diet controlled, checking labs Vitamin D deficiency: Checking labs Depression: Slightly worse, no suicidal ideas, continue with Lexapro.  Reassess in 3 months Generalized pain: As described above, h/o remote sarcoid. Check a sed rate, CK, CRP, RF, ANA.  Does have a headache but not far from her baseline. Pain management: UDS and contract today Encouraged to keep her F/Us, we need to see her more often to be effective taking care of her RTC 3 months.

## 2017-08-27 MED ORDER — ERTUGLIFLOZIN L-PYROGLUTAMICAC 5 MG PO TABS: 5.0000 mg | ORAL_TABLET | Freq: Every day | ORAL | 3 refills | Status: DC

## 2017-08-27 NOTE — Addendum Note (Signed)
Addended byConrad Palmer: Dakarri Kessinger D on: 08/27/2017 02:29 PM   Modules accepted: Orders

## 2017-08-30 ENCOUNTER — Telehealth: Payer: Self-pay

## 2017-08-30 LAB — PAIN MGMT, PROFILE 8 W/CONF, U
6 Acetylmorphine: NEGATIVE ng/mL (ref <10)
ALCOHOL METABOLITES: NEGATIVE ng/mL (ref <500)
AMPHETAMINES: NEGATIVE ng/mL (ref <500)
Benzodiazepines: NEGATIVE ng/mL (ref <100)
Buprenorphine, Urine: NEGATIVE ng/mL (ref <5)
CREATININE: 191.6 mg/dL
Cocaine Metabolite: NEGATIVE ng/mL (ref <150)
Codeine: 707 ng/mL — ABNORMAL HIGH (ref <50)
HYDROMORPHONE: NEGATIVE ng/mL (ref <50)
Hydrocodone: 274 ng/mL — ABNORMAL HIGH (ref <50)
MDMA: NEGATIVE ng/mL (ref <500)
MORPHINE: 138 ng/mL — AB (ref <50)
Marijuana Metabolite: NEGATIVE ng/mL (ref <20)
Norhydrocodone: 202 ng/mL — ABNORMAL HIGH (ref <50)
OXIDANT: NEGATIVE ug/mL (ref <200)
Opiates: POSITIVE ng/mL — AB (ref <100)
Oxycodone: NEGATIVE ng/mL (ref <100)
PH: 6.17 (ref 4.5–9.0)

## 2017-08-30 LAB — VITAMIN D 1,25 DIHYDROXY
VITAMIN D 1, 25 (OH) TOTAL: 40 pg/mL (ref 18–72)
Vitamin D2 1, 25 (OH)2: 18 pg/mL
Vitamin D3 1, 25 (OH)2: 22 pg/mL

## 2017-08-30 LAB — ANA: ANA: NEGATIVE

## 2017-08-30 LAB — RHEUMATOID FACTOR: Rhuematoid fact SerPl-aCnc: <14 IU/mL (ref <14)

## 2017-08-30 NOTE — Telephone Encounter (Signed)
PA initiated via Covermymeds; KEY: [id number]. Awaiting determination.

## 2017-08-31 ENCOUNTER — Other Ambulatory Visit: Payer: Self-pay | Admitting: Internal Medicine

## 2017-08-31 ENCOUNTER — Telehealth: Payer: Self-pay | Admitting: Internal Medicine

## 2017-08-31 ENCOUNTER — Other Ambulatory Visit: Payer: Self-pay | Admitting: *Deleted

## 2017-08-31 MED ORDER — BISOPROLOL-HYDROCHLOROTHIAZIDE 2.5-6.25 MG PO TABS: 0.5000 | ORAL_TABLET | Freq: Every day | ORAL | 6 refills | Status: DC

## 2017-08-31 MED ORDER — DAPAGLIFLOZIN PROPANEDIOL 5 MG PO TABS: 5.0000 mg | ORAL_TABLET | Freq: Every day | ORAL | 3 refills | Status: DC

## 2017-08-31 MED ORDER — METFORMIN HCL 1000 MG PO TABS: 1000.0000 mg | ORAL_TABLET | Freq: Two times a day (BID) | ORAL | 6 refills | Status: DC

## 2017-08-31 MED ORDER — PIOGLITAZONE HCL 30 MG PO TABS: 30.0000 mg | ORAL_TABLET | Freq: Every day | ORAL | 6 refills | Status: DC

## 2017-08-31 MED ORDER — SITAGLIPTIN PHOSPHATE 100 MG PO TABS: 100.0000 mg | ORAL_TABLET | Freq: Every day | ORAL | 6 refills | Status: DC

## 2017-08-31 MED ORDER — ESCITALOPRAM OXALATE 20 MG PO TABS: 20.0000 mg | ORAL_TABLET | Freq: Every day | ORAL | 0 refills | Status: DC

## 2017-08-31 NOTE — Telephone Encounter (Signed)
Diabetic medications and Ziac refilled per protocol- office visit 08/2017. Lexapro pending at office. Please review [place] for insurance coverage

## 2017-08-31 NOTE — Telephone Encounter (Signed)
[facility] [place]

## 2017-08-31 NOTE — Telephone Encounter (Signed)
[facility]

## 2017-08-31 NOTE — Telephone Encounter (Signed)
PA denied. Waiting for denial information.  

## 2017-08-31 NOTE — Telephone Encounter (Signed)
steglatro denied Try Farxiga 5 mg 1 po qd #30, 3 RF

## 2017-08-31 NOTE — Telephone Encounter (Signed)
Noted in FYI section 

## 2017-08-31 NOTE — Telephone Encounter (Signed)
PA denied. Formulary alternatives: Docia BarrierFarxiga, Invokana, Invokamet, Inokamet XR, or Xigduo XR. Please advise.

## 2017-08-31 NOTE — Telephone Encounter (Signed)
Patient is prescribed oxycodone as needed UDS + for opiates, codeine, hydrocodone, morphine. Negative for oxycodone Advised patient: We found a number of medications in his urine, if I am going to continue prescribing oxycodone she cannot take pain medication from other sources. High risk, recheck with next prescription.

## 2017-08-31 NOTE — Telephone Encounter (Signed)
Copied from CRM [id number]. Topic: Quick Communication - Rx Refill/Question >> Aug 31, 2017  1:42 PM Everardo PacificMoton, Nafisah Runions, [place] wrote: Medication: Actos, Metformin,Januvia,Ziac and Lexapro   Has the patient contacted their pharmacy? Yes Patient was told by the pharmacy that her doctor hasn't sent over the prescriptions from the five medications that she needs(FYI:.Patient stated that the new  medication her insurance want pay for it)  Preferred Pharm[facility]with phone number or street name[facility] [address] [place] [phone]   Agent: Please be advised that RX refills may take up to 3 business days. We ask that you follow-up with your pharmacy.

## 2017-11-23 ENCOUNTER — Ambulatory Visit: Payer: Managed Care, Other (non HMO) | Admitting: Internal Medicine

## 2017-11-23 DIAGNOSIS — Z0289 Encounter for other administrative examinations: Secondary | ICD-10-CM

## 2017-11-30 ENCOUNTER — Encounter: Payer: Self-pay | Admitting: Internal Medicine

## 2017-11-30 ENCOUNTER — Ambulatory Visit (INDEPENDENT_AMBULATORY_CARE_PROVIDER_SITE_OTHER): Payer: Managed Care, Other (non HMO) | Admitting: Internal Medicine

## 2017-11-30 VITALS — BP 128/68 | HR 72 | Temp 97.8°F | Resp 16 | Ht 67.0 in | Wt 193.4 lb

## 2017-11-30 DIAGNOSIS — M542 Cervicalgia: Secondary | ICD-10-CM | POA: Diagnosis not present

## 2017-11-30 DIAGNOSIS — E559 Vitamin D deficiency, unspecified: Secondary | ICD-10-CM

## 2017-11-30 DIAGNOSIS — E119 Type 2 diabetes mellitus without complications: Secondary | ICD-10-CM

## 2017-11-30 DIAGNOSIS — Z0100 Encounter for examination of eyes and vision without abnormal findings: Secondary | ICD-10-CM

## 2017-11-30 DIAGNOSIS — Z79899 Other long term (current) drug therapy: Secondary | ICD-10-CM

## 2017-11-30 NOTE — Assessment & Plan Note (Signed)
DM: Based on last A1c, Alexandra Parsons was added, she only started it 3 weeks ago.  She is also on Actos, Januvia and metformin.  Check a A1c and micro in 6 weeks.  Declined a foot exam.  Refer her for a eye check. Dyslipidemia: Diet and exercise discussed, recheck in 4 to 5 months when she comes back Vitamin D deficiency: Encouraged to take supplements daily Generalized pain: W/U (-), today the patient reports that aches are going on for years, they are at baseline, she is not too concerned about them. Pain management, neckpain: Last UDS show hydrocodone, morphine and codeine.  She reports she got some hydrocodone and Tylenol # 3 from her dentist.  I reminded patient she is under an e agreement with me, she is to take only Percocet as prescribed.  UDS today.  Call for Percocet RF when needed RTC 4 to 5 months for routine checkup

## 2017-11-30 NOTE — Patient Instructions (Addendum)
GO TO THE LAB : Please provide a urine sample    GO TO THE FRONT DESK Schedule your next appointment for a checkup in 4 to 5 months  Schedule labs to be done 6 weeks from today, no need to fast  Call for a refill on Percocet when needed

## 2017-11-30 NOTE — Progress Notes (Signed)
Pre visit review using our clinic review tool, if applicable. No additional management support is needed unless otherwise documented below in the visit note. 

## 2017-11-30 NOTE — Progress Notes (Signed)
Subjective:    Patient ID: Alexandra Parsons, female    DOB: Oct 11, 1960, 56 y.o.   MRN: [id number]  DOS:  11/30/2017 Type of visit - description : rov Interval history: -DM: Started farxiga only 3 weeks ago, has not noticed any major changes on her CBGs. -We discussed her UDS results. -Generalized aches and pains: Work-up negative, symptoms not severe, at baseline, she is not very concerned about them. -Not taking vitamin D supplements  Wt Readings from Last 3 Encounters:  11/30/17 193 lb 6 oz (87.7 kg)  08/25/17 191 lb 9.6 oz (86.9 kg)  11/03/16 191 lb 6 oz (86.8 kg)     Review of Systems Denies low sugar type symptoms  Past Medical History:  Diagnosis Date  . Abnormal Pap smear of cervix    h/o HPV  . Chronic sinusitis, chronic cough (?COPD) 07/16/2009  . Depression   . Diabetes mellitus    type 2  . Dyslipidemia   . Elevated LFTs 11/19/2011  . Hypertension   . Leukocytosis 11/19/2011  . Menopausal syndrome   . Neck pain 05/20/2011  . Sarcoid    post partum in remission  . Submandibular gland swelling    Right    Past Surgical History:  Procedure Laterality Date  . abdominal plasty remotely    . NASAL SEPTUM SURGERY  12/26/2015  . pilondial cyst removal    . TUBAL LIGATION    . TURBINATE REDUCTION Bilateral 12/26/2015    Social History   Socioeconomic History  . Marital status: Widowed    Spouse name: Not on file  . Number of children: 1  . Years of education: Not on file  . Highest education level: Not on file  Occupational History  . Occupation: Charity fundraiser [employer]  Social Needs  . Financial resource strain: Not on file  . Food insecurity:    Worry: Not on file    Inability: Not on file  . Transportation needs:    Medical: Not on file    Non-medical: Not on file  Tobacco Use  . Smoking status: Current Every Day Smoker    Years: 0.50  . Smokeless tobacco: Never Used  . Tobacco comment: ~ 3/4 ppd  Substance and Sexual Activity  . Alcohol  use: Yes    Alcohol/week: 0.0 oz    Comment: rare  . Drug use: No  . Sexual activity: Not on file  Lifestyle  . Physical activity:    Days per week: Not on file    Minutes per session: Not on file  . Stress: Not on file  Relationships  . Social connections:    Talks on phone: Not on file    Gets together: Not on file    Attends religious service: Not on file    Active member of club or organization: Not on file    Attends meetings of clubs or organizations: Not on file    Relationship status: Not on file  . Intimate partner violence:    Fear of current or ex partner: Not on file    Emotionally abused: Not on file    Physically abused: Not on file    Forced sexual activity: Not on file  Other Topics Concern  . Not on file  Social History Narrative   Lost husband, prostate cancer, 08-2012   Lives by herself   Has a daughter , lives in [place]      Allergies as of 11/30/2017      Reactions  Erythromycin    REACTION: palpitations; ZPACK is ok   Fluoxetine Hcl    REACTION: hallucinations   Venlafaxine    REACTION: made her feel bad      Medication List        Accurate as of 11/30/17  3:28 PM. Always use your most recent med list.          aspirin EC 81 MG tablet Take 81 mg by mouth daily.   bisoprolol-hydrochlorothiazide 2.5-6.25 MG tablet Commonly known as:  ZIAC Take 0.5 tablets by mouth daily.   cholecalciferol 1000 units tablet Commonly known as:  VITAMIN D Take 1,000 Units by mouth daily.   dapagliflozin propanediol 5 MG Tabs tablet Commonly known as:  FARXIGA Take 5 mg by mouth daily.   escitalopram 20 MG tablet Commonly known as:  LEXAPRO Take 1 tablet (20 mg total) by mouth daily.   metFORMIN 1000 MG tablet Commonly known as:  GLUCOPHAGE Take 1 tablet (1,000 mg total) by mouth 2 (two) times daily with a meal.   oxyCODONE-acetaminophen 5-325 MG tablet Commonly known as:  PERCOCET/ROXICET Take 1 tablet by mouth every 4 (four) hours as needed.     pioglitazone 30 MG tablet Commonly known as:  ACTOS Take 1 tablet (30 mg total) by mouth daily.   sitaGLIPtin 100 MG tablet Commonly known as:  JANUVIA Take 1 tablet (100 mg total) by mouth daily.          Objective:   Physical Exam BP 128/68 (BP Location: Left Arm, Patient Position: Sitting, Cuff Size: Small)   Pulse 72   Temp 97.8 F (36.6 C) (Oral)   Resp 16   Ht 5\' 7"  (1.702 m)   Wt 193 lb 6 oz (87.7 kg)   SpO2 98%   BMI 30.29 kg/m  General:   Well developed, well nourished . NAD.  HEENT:  Normocephalic . Face symmetric, atraumatic Lungs:  CTA B Normal respiratory effort, no intercostal retractions, no accessory muscle use. Heart: RRR,  no murmur.  No pretibial edema bilaterally  Skin: Not pale. Not jaundice Feet exam--------- decline Neurologic:  alert & oriented X3.  Speech normal, gait appropriate for age and unassisted Psych--  Cognition and judgment appear intact.  Cooperative with normal attention span and concentration.  Behavior appropriate. No anxious or depressed appearing.      Assessment & Plan:     Assessment DM HTN Dyslipidemia Vit d def  -- Smoker ~1  ppd Psych: --Depression --ADHD MSK: Neck pain, chronic, on pain meds prn, UDS 09-2013 risk; intolerant to vicodin, oxycodone ok GYN: --BTL, Menopausal , h/o  Abnormal Pap smear, HPV Elevated LFTs: Hepatitis serology (-) 2008 H/o Sarcoid postpartum in remission H/o Chronic cough: Chronic sinusitis? COPD? H/o Leukocytosis  H/o +  PPD   PLAN DM: Based on last A1c, Marcelline Deist was added, she only started it 3 weeks ago.  She is also on Actos, Januvia and metformin.  Check a A1c and micro in 6 weeks.  Declined a foot exam.  Refer her for a eye check. Dyslipidemia: Diet and exercise discussed, recheck in 4 to 5 months when she comes back Vitamin D deficiency: Encouraged to take supplements daily Generalized pain: W/U (-), today the patient reports that aches are going on for years, they  are at baseline, she is not too concerned about them. Pain management, neckpain: Last UDS show hydrocodone, morphine and codeine.  She reports she got some hydrocodone and Tylenol # 3 from her dentist.  I reminded  patient she is under an e agreement with me, she is to take only Percocet as prescribed.  UDS today.  Call for Percocet RF when needed RTC 4 to 5 months for routine checkup

## 2017-12-01 LAB — PAIN MGMT, PROFILE 8 W/CONF, U
6 ACETYLMORPHINE: NEGATIVE ng/mL (ref <10)
AMPHETAMINES: NEGATIVE ng/mL (ref <500)
Alcohol Metabolites: NEGATIVE ng/mL (ref <500)
BUPRENORPHINE, URINE: NEGATIVE ng/mL (ref <5)
Benzodiazepines: NEGATIVE ng/mL (ref <100)
Cocaine Metabolite: NEGATIVE ng/mL (ref <150)
Creatinine: 83.5 mg/dL
MDMA: NEGATIVE ng/mL (ref <500)
Marijuana Metabolite: NEGATIVE ng/mL (ref <20)
OPIATES: NEGATIVE ng/mL (ref <100)
Oxidant: NEGATIVE ug/mL (ref <200)
Oxycodone: NEGATIVE ng/mL (ref <100)
pH: 6.28 (ref 4.5–9.0)

## 2017-12-22 ENCOUNTER — Encounter: Payer: Self-pay | Admitting: Family

## 2017-12-22 ENCOUNTER — Telehealth: Payer: Self-pay | Admitting: *Deleted

## 2017-12-22 ENCOUNTER — Ambulatory Visit: Payer: Managed Care, Other (non HMO) | Admitting: Family

## 2017-12-22 ENCOUNTER — Telehealth: Payer: Self-pay | Admitting: Family

## 2017-12-22 VITALS — BP 127/69 | HR 85 | Temp 98.8°F | Resp 16 | Ht 67.0 in | Wt 194.0 lb

## 2017-12-22 DIAGNOSIS — J209 Acute bronchitis, unspecified: Secondary | ICD-10-CM

## 2017-12-22 DIAGNOSIS — J029 Acute pharyngitis, unspecified: Secondary | ICD-10-CM

## 2017-12-22 LAB — POCT RAPID STREP A (OFFICE): RAPID STREP A SCREEN: NEGATIVE

## 2017-12-22 MED ORDER — AZITHROMYCIN 250 MG PO TABS: ORAL_TABLET | ORAL | 0 refills | Status: DC

## 2017-12-22 MED ORDER — BENZONATATE 100 MG PO CAPS: 100.0000 mg | ORAL_CAPSULE | Freq: Three times a day (TID) | ORAL | 0 refills | Status: DC | PRN

## 2017-12-22 MED ORDER — OXYCODONE-ACETAMINOPHEN 5-325 MG PO TABS: 1.0000 | ORAL_TABLET | ORAL | 0 refills | Status: DC | PRN

## 2017-12-22 NOTE — Patient Instructions (Signed)
Begin zpak, call if symptome worsen or if they fail to improve. You may use mucinex as needed for congestion. Work on quitting smoking.

## 2017-12-22 NOTE — Telephone Encounter (Signed)
Copied from CRM [id number]. Topic: Inquiry >> Dec 22, 2017  2:53 PM Landry Mellow wrote: Reason for CRM: pt needs a doctors note for today.  She was seen today.  Cb is [phone] She needs it for work tonight , she can pick up tomorrow

## 2017-12-22 NOTE — Progress Notes (Signed)
Subjective:    Patient ID: Alexandra Parsons, female    DOB: October 20, 1960, 57 y.o.   MRN: [id number]  HPI  Alexandra Parsons is a 57 yr old female who presents today with chief complaint of sore throat. Reports sore throat has been present x 5 days and is associated with 2 day hx of cough, sneezing, nasal congestion. She reports that she has been using advil prn pain. Reports + stress urinary incontinence due to coughing. She is a smoker.      Review of Systems See HPI  Past Medical History:  Diagnosis Date  . Abnormal Pap smear of cervix    h/o HPV  . Chronic sinusitis, chronic cough (?COPD) 07/16/2009  . Depression   . Diabetes mellitus    type 2  . Dyslipidemia   . Elevated LFTs 11/19/2011  . Hypertension   . Leukocytosis 11/19/2011  . Menopausal syndrome   . Neck pain 05/20/2011  . Sarcoid    post partum in remission  . Submandibular gland swelling    Right     Social History   Socioeconomic History  . Marital status: Widowed    Spouse name: Not on file  . Number of children: 1  . Years of education: Not on file  . Highest education level: Not on file  Occupational History  . Occupation: Charity fundraiser [employer]  Social Needs  . Financial resource strain: Not on file  . Food insecurity:    Worry: Not on file    Inability: Not on file  . Transportation needs:    Medical: Not on file    Non-medical: Not on file  Tobacco Use  . Smoking status: Current Every Day Smoker    Years: 0.50  . Smokeless tobacco: Never Used  . Tobacco comment: ~ 3/4 ppd  Substance and Sexual Activity  . Alcohol use: Yes    Alcohol/week: 0.0 oz    Comment: rare  . Drug use: No  . Sexual activity: Not on file  Lifestyle  . Physical activity:    Days per week: Not on file    Minutes per session: Not on file  . Stress: Not on file  Relationships  . Social connections:    Talks on phone: Not on file    Gets together: Not on file    Attends religious service: Not on file    Active member  of club or organization: Not on file    Attends meetings of clubs or organizations: Not on file    Relationship status: Not on file  . Intimate partner violence:    Fear of current or ex partner: Not on file    Emotionally abused: Not on file    Physically abused: Not on file    Forced sexual activity: Not on file  Other Topics Concern  . Not on file  Social History Narrative   Lost husband, prostate cancer, 08-2012   Lives by herself   Has a daughter , lives in [place]    Past Surgical History:  Procedure Laterality Date  . abdominal plasty remotely    . NASAL SEPTUM SURGERY  12/26/2015  . pilondial cyst removal    . TUBAL LIGATION    . TURBINATE REDUCTION Bilateral 12/26/2015    Family History  Problem Relation Age of Onset  . Hypertension Other        GM  . Colon cancer Other        COLON CA GGM x 2  .  CAD Other        GF at age 86  . Diabetes Other        aunt  . Stroke Neg Hx   . Breast cancer Neg Hx     Allergies  Allergen Reactions  . Erythromycin     REACTION: palpitations; ZPACK is ok  . Fluoxetine Hcl     REACTION: hallucinations  . Venlafaxine     REACTION: made her feel bad    Current Outpatient Medications on File Prior to Visit  Medication Sig Dispense Refill  . bisoprolol-hydrochlorothiazide (ZIAC) 2.5-6.25 MG tablet Take 0.5 tablets by mouth daily. (Patient taking differently: Take 1 tablet by mouth daily. ) 15 tablet 6  . dapagliflozin propanediol (FARXIGA) 5 MG TABS tablet Take 5 mg by mouth daily. 30 tablet 3  . escitalopram (LEXAPRO) 20 MG tablet Take 1 tablet (20 mg total) by mouth daily. 90 tablet 0  . metFORMIN (GLUCOPHAGE) 1000 MG tablet Take 1 tablet (1,000 mg total) by mouth 2 (two) times daily with a meal. 60 tablet 6  . oxyCODONE-acetaminophen (PERCOCET/ROXICET) 5-325 MG tablet Take 1 tablet by mouth every 4 (four) hours as needed. 60 tablet 0  . pioglitazone (ACTOS) 30 MG tablet Take 1 tablet (30 mg total) by mouth daily. 30 tablet 6  .  sitaGLIPtin (JANUVIA) 100 MG tablet Take 1 tablet (100 mg total) by mouth daily. 30 tablet 6  . aspirin EC 81 MG tablet Take 81 mg by mouth daily.     No current facility-administered medications on file prior to visit.     BP 127/69 (BP Location: Right Arm, Cuff Size: Large)   Pulse 85   Temp 98.8 F (37.1 C) (Oral)   Resp 16   Ht  (1.702 m)   Wt 194 lb (88 kg)   SpO2 98%   BMI 30.38 kg/m       Objective:   Physical Exam  Constitutional: She appears well-developed and well-nourished.  HENT:  Head: Normocephalic and atraumatic.  Left Ear: Tympanic membrane and ear canal normal.  Mouth/Throat: Posterior oropharyngeal erythema present. No oropharyngeal exudate or posterior oropharyngeal edema.  R TM occluded by cerumen  Neck: Neck supple.  Cardiovascular: Normal rate, regular rhythm and normal heart sounds.  No murmur heard. Pulmonary/Chest: Effort normal. No respiratory distress. She has wheezes.  Psychiatric: She has a normal mood and affect. Her behavior is normal. Judgment and thought content normal.          Assessment & Plan:  Bronchitis with mild bronchospasm- Rapid strep is negative. will rx with zpak (suspect element of COPD given her extensive smoking history). Her chart notes + palpitations with erythromycin, however she states that she tolerates zpak without side effect. Recommended rx with albuterol to help with her cough. She declines- instead requesting a cough syrup with codeine as well as a refill on her oxycodone. Advised pt to try tessalon and that I would forward her controlled substance request to her PCP. She brings her bottle with her today and she has a handful of tabs left of oxycodone.  She is advised to let us know if her cough/wheezing worsens or if it fails to improve.

## 2017-12-22 NOTE — Telephone Encounter (Signed)
Pt came to see me today for cough. Requested rx for codeine cough syrup and refill on her oxycodone.  I advised her to take tessalon prn and told her that I would defer oxycodone refills to you since you are her pcp.

## 2017-12-22 NOTE — Telephone Encounter (Signed)
Advise pt I sent a RX

## 2017-12-22 NOTE — Telephone Encounter (Signed)
Spoke w/ [facility]- dx code: M54.2- chronic neck pain given.

## 2017-12-22 NOTE — Telephone Encounter (Signed)
Copied from CRM [id number]. Topic: Quick Communication - See Telephone Encounter >> Dec 22, 2017 12:13 PM Diana Eves B wrote: CRM for notification. See Telephone encounter for: 12/22/17.  [facility] needing diagnosis code for the oxyCODONE-acetaminophen (PERCOCET/ROXICET) 5-325 MG tablet. CB# [phone]

## 2017-12-23 ENCOUNTER — Encounter: Payer: Self-pay | Admitting: Family

## 2017-12-23 NOTE — Telephone Encounter (Signed)
CALLED BUT NO ANSWER, LM FOR PATIENT TO BE AWARE LETTER WILL BE AT THE FRONT DESK.

## 2017-12-23 NOTE — Telephone Encounter (Signed)
Letter has been written and printed at my printer.  Could you please contact pt?

## 2018-01-11 ENCOUNTER — Other Ambulatory Visit (INDEPENDENT_AMBULATORY_CARE_PROVIDER_SITE_OTHER): Payer: Managed Care, Other (non HMO)

## 2018-01-11 DIAGNOSIS — E119 Type 2 diabetes mellitus without complications: Secondary | ICD-10-CM

## 2018-01-11 LAB — MICROALBUMIN / CREATININE URINE RATIO
Creatinine,U: 235.7 mg/dL
MICROALB UR: 1.4 mg/dL (ref 0.0–1.9)
Microalb Creat Ratio: 0.6 mg/g (ref 0.0–30.0)

## 2018-01-12 LAB — BASIC METABOLIC PANEL
BUN: 19 mg/dL (ref 6–23)
CO2: 27 mEq/L (ref 19–32)
CREATININE: 0.99 mg/dL (ref 0.40–1.20)
Calcium: 9.4 mg/dL (ref 8.4–10.5)
Chloride: 102 mEq/L (ref 96–112)
GFR: 61.29 mL/min (ref >60.00)
GLUCOSE: 117 mg/dL — AB (ref 70–99)
POTASSIUM: 4 meq/L (ref 3.5–5.1)
Sodium: 138 mEq/L (ref 135–145)

## 2018-01-12 LAB — HEMOGLOBIN A1C: Hgb A1c MFr Bld: 7 % — ABNORMAL HIGH (ref 4.6–6.5)

## 2018-01-14 NOTE — Progress Notes (Signed)
Letter printed per Dr. Leta JunglingPaz's request. Letter sealed and left at Dr. Olive BassPaz's CMA's desk for pickup.

## 2018-02-16 ENCOUNTER — Other Ambulatory Visit: Payer: Self-pay | Admitting: Internal Medicine

## 2018-03-21 ENCOUNTER — Other Ambulatory Visit: Payer: Self-pay | Admitting: Internal Medicine

## 2018-03-21 MED ORDER — OXYCODONE-ACETAMINOPHEN 5-325 MG PO TABS: 1.0000 | ORAL_TABLET | ORAL | 0 refills | Status: DC | PRN

## 2018-03-21 NOTE — Telephone Encounter (Signed)
Copied from CRM #[id number]. Topic: Quick Communication - Rx Refill/Question °>> Mar 21, 2018 10:56 AM Johnson, Shiquita C wrote: °Medication: oxyCODONE-acetaminophen (PERCOCET/ROXICET) 5-325 MG tablet ° °Has the patient contacted their pharmacy? No ° °(Agent: If no, request that the patient contact the pharmacy for the refill.) °(Agent: If yes, when and what did the pharmacy advise?) ° °Preferred Pharmacy (with phone number or street name): [facility] - [place], [place] - [address] [phone] (Phone) [phone] (Fax) ° ° ° °Agent: Please be advised that RX refills may take up to 3 business days. We ask that you follow-up with your pharmacy. °

## 2018-03-21 NOTE — Telephone Encounter (Signed)
Oxycodone 5-325refill Last Refill:12/22/17 # 40 Last OV: 08/25/17 PCP: Drue NovelPaz Pharmacy:[facility] [phone]

## 2018-03-21 NOTE — Telephone Encounter (Signed)
Copied from CRM [id number]. Topic: Quick Communication - Rx Refill/Question >> Mar 21, 2018 10:56 AM [name], Alexandra C wrote: Medication: oxyCODONE-acetaminophen (PERCOCET/ROXICET) 5-325 MG tablet  Has the patient contacted their pharmacy? No  (Agent: If no, request that the patient contact the pharmacy for the refill.) (Agent: If yes, when and what did the pharmacy advise?)  Preferred Pharmacy (with phone number or street name): [facility] [address] - [place], [place] - [id number] [phone] (Phone) [phone] (Fax)    Agent: Please be advised that RX refills may take up to 3 business days. We ask that you follow-up with your pharmacy.

## 2018-05-06 ENCOUNTER — Telehealth: Payer: Self-pay | Admitting: Internal Medicine

## 2018-05-06 MED ORDER — OXYCODONE-ACETAMINOPHEN 5-325 MG PO TABS: 1.0000 | ORAL_TABLET | ORAL | 0 refills | Status: DC | PRN

## 2018-05-06 NOTE — Telephone Encounter (Signed)
Pt requesting refill on oxycodone.   Last OV: 12/22/2017, appt scheduled 05/09/2018 Last Fill: 03/21/2018 #40 and 0RF UDS: 08/25/2017 Low risk  NCCR printed- no discrepancies noted- sent for scanning.

## 2018-05-06 NOTE — Telephone Encounter (Signed)
Percocet 5-325 mg refill request  PCP Dr. Drue Novel.  He has an appt with Dr. Drue Novel Monday 05/09/18 at 11:00.   Refill during this OV in case of changes?

## 2018-05-06 NOTE — Telephone Encounter (Signed)
sent 

## 2018-05-06 NOTE — Telephone Encounter (Signed)
Copied from CRM [id number]. Topic: General - Other >> May 06, 2018 10:22 AM Ronney Lion A wrote: Medication: oxyCODONE-acetaminophen (PERCOCET/ROXICET) 5-325 MG   Has the patient contacted their pharmacy? Yes  Preferred Pharmacy (with phone number or street name): [facility] [address] [place], [place] - [address]  [phone] (Phone) [phone] (Fax)    Agent: Please be advised that RX refills may take up to 3 business days. We ask that you follow-up with your pharmacy.

## 2018-05-09 ENCOUNTER — Encounter: Payer: Self-pay | Admitting: Internal Medicine

## 2018-05-09 ENCOUNTER — Ambulatory Visit (INDEPENDENT_AMBULATORY_CARE_PROVIDER_SITE_OTHER): Payer: Managed Care, Other (non HMO) | Admitting: Internal Medicine

## 2018-05-09 VITALS — BP 134/70 | HR 67 | Temp 97.9°F | Resp 16 | Ht 67.0 in | Wt 187.4 lb

## 2018-05-09 DIAGNOSIS — Z23 Encounter for immunization: Secondary | ICD-10-CM

## 2018-05-09 DIAGNOSIS — E119 Type 2 diabetes mellitus without complications: Secondary | ICD-10-CM

## 2018-05-09 DIAGNOSIS — H539 Unspecified visual disturbance: Secondary | ICD-10-CM | POA: Diagnosis not present

## 2018-05-09 DIAGNOSIS — E7800 Pure hypercholesterolemia, unspecified: Secondary | ICD-10-CM

## 2018-05-09 LAB — LIPID PANEL
CHOLESTEROL: 176 mg/dL (ref 0–200)
HDL: 22.6 mg/dL — ABNORMAL LOW (ref >39.00)
LDL CALC: 124 mg/dL — AB (ref 0–99)
NonHDL: 153.46
TRIGLYCERIDES: 146 mg/dL (ref 0.0–149.0)
Total CHOL/HDL Ratio: 8
VLDL: 29.2 mg/dL (ref 0.0–40.0)

## 2018-05-09 LAB — HEMOGLOBIN A1C: HEMOGLOBIN A1C: 6.4 % (ref 4.6–6.5)

## 2018-05-09 NOTE — Patient Instructions (Addendum)
GO TO THE LAB : Get the blood work     GO TO THE FRONT DESK Schedule your next appointment for a  Physical in 4 months   Please see your eye doctor ASAP  We will get a carotid ultrasound  Call if your eye symptoms return

## 2018-05-09 NOTE — Progress Notes (Signed)
Subjective:    Patient ID: Alexandra Parsons, female    DOB: 1960-12-07, 57 y.o.   MRN: [id number]  DOS:  05/09/2018 Type of visit - description : f/u  Interval history: DM: Since the last office visit, she started farxiga, develop a couple of side effects (see below) and decided to stop. She continue the other medications and has actually improve her diet and lost some weight. High cholesterol: Due for FLP  When she was on farxiga,  had 3 episodes of visual disturbances, reports that part of her visual field "moved". Episode lasted 20 minutes. She also reports that she had some difficulty urinating. Both the visual symptoms and the urinary symptoms are stopped after she discontinued farxiga  Wt Readings from Last 3 Encounters:  05/09/18 187 lb 6 oz (85 kg)  12/22/17 194 lb (88 kg)  11/30/17 193 lb 6 oz (87.7 kg)     Review of Systems Denies fever chills. No dysuria, gross hematuria or genital rash. Denies headache, nausea, slurred speech, facial deficits or amaurosis fugax type of symptoms.  Past Medical History:  Diagnosis Date  . Abnormal Pap smear of cervix    h/o HPV  . Chronic sinusitis, chronic cough (?COPD) 07/16/2009  . Depression   . Diabetes mellitus    type 2  . Dyslipidemia   . Elevated LFTs 11/19/2011  . Hypertension   . Leukocytosis 11/19/2011  . Menopausal syndrome   . Neck pain 05/20/2011  . Sarcoid    post partum in remission  . Submandibular gland swelling    Right    Past Surgical History:  Procedure Laterality Date  . abdominal plasty remotely    . NASAL SEPTUM SURGERY  12/26/2015  . pilondial cyst removal    . TUBAL LIGATION    . TURBINATE REDUCTION Bilateral 12/26/2015    Social History   Socioeconomic History  . Marital status: Widowed    Spouse name: Not on file  . Number of children: 1  . Years of education: Not on file  . Highest education level: Not on file  Occupational History  . Occupation: Charity fundraiser [employer]  Social  Needs  . Financial resource strain: Not on file  . Food insecurity:    Worry: Not on file    Inability: Not on file  . Transportation needs:    Medical: Not on file    Non-medical: Not on file  Tobacco Use  . Smoking status: Current Every Day Smoker    Years: 0.50  . Smokeless tobacco: Never Used  . Tobacco comment: ~ 3/4 ppd  Substance and Sexual Activity  . Alcohol use: Yes    Alcohol/week: 0.0 standard drinks    Comment: rare  . Drug use: No  . Sexual activity: Not on file  Lifestyle  . Physical activity:    Days per week: Not on file    Minutes per session: Not on file  . Stress: Not on file  Relationships  . Social connections:    Talks on phone: Not on file    Gets together: Not on file    Attends religious service: Not on file    Active member of club or organization: Not on file    Attends meetings of clubs or organizations: Not on file    Relationship status: Not on file  . Intimate partner violence:    Fear of current or ex partner: Not on file    Emotionally abused: Not on file  Physically abused: Not on file    Forced sexual activity: Not on file  Other Topics Concern  . Not on file  Social History Narrative   Lost husband, prostate cancer, 08-2012   Lives by herself   Has a daughter , lives in [place]      Allergies as of 05/09/2018      Reactions   Erythromycin    REACTION: palpitations; ZPACK is ok   Fluoxetine Hcl    REACTION: hallucinations   Venlafaxine    REACTION: made her feel bad      Medication List        Accurate as of 05/09/18 11:59 PM. Always use your most recent med list.          aspirin EC 81 MG tablet Take 81 mg by mouth daily.   bisoprolol-hydrochlorothiazide 2.5-6.25 MG tablet Commonly known as:  ZIAC Take 0.5 tablets by mouth daily.   escitalopram 20 MG tablet Commonly known as:  LEXAPRO Take 1 tablet (20 mg total) by mouth daily.   metFORMIN 1000 MG tablet Commonly known as:  GLUCOPHAGE Take 1 tablet (1,000 mg  total) by mouth 2 (two) times daily with a meal.   oxyCODONE-acetaminophen 5-325 MG tablet Commonly known as:  PERCOCET/ROXICET Take 1 tablet by mouth every 4 (four) hours as needed.   pioglitazone 30 MG tablet Commonly known as:  ACTOS Take 1 tablet (30 mg total) by mouth daily.   sitaGLIPtin 100 MG tablet Commonly known as:  JANUVIA Take 1 tablet (100 mg total) by mouth daily.          Objective:   Physical Exam BP 134/70 (BP Location: Left Arm, Patient Position: Sitting, Cuff Size: Normal)   Pulse 67   Temp 97.9 F (36.6 C) (Oral)   Resp 16   Ht 5\' 7"  (1.702 m)   Wt 187 lb 6 oz (85 kg)   SpO2 97%   BMI 29.35 kg/m  General:   Well developed, NAD, see BMI.  HEENT:  Normocephalic . Face symmetric, atraumatic.  EOMI.  Pupils equal and reactive. Neck: Carotid pulses present but difficult to find.  No bruit. Lungs:  CTA B Normal respiratory effort, no intercostal retractions, no accessory muscle use. Heart: RRR,  no murmur.  No pretibial edema bilaterally  Skin: Not pale. Not jaundice Neurologic:  alert & oriented X3.  Speech normal, gait appropriate for age and unassisted motor symmetric.  Face symmetric. Psych--  Cognition and judgment appear intact.  Cooperative with normal attention span and concentration.  Behavior appropriate. No anxious or depressed appearing.      Assessment & Plan:   Assessment DM HTN Dyslipidemia Vit d def  -- Smoker ~1  ppd Psych: --Depression --ADHD MSK: Neck pain, chronic, on pain meds prn, UDS 09-2013 risk; intolerant to vicodin, oxycodone ok GYN: --BTL, Menopausal , h/o  Abnormal Pap smear, HPV Elevated LFTs: Hepatitis serology (-) 2008 H/o Sarcoid postpartum in remission H/o Chronic cough: Chronic sinusitis? COPD? H/o Leukocytosis  H/o +  PPD   PLAN DM: Since the last OV she continue w/  Glucophage, Actos, Januvia; also took [place] temporarily; her  A1c dropped to 7.0 (from 7.6).  She however stopped that medication  due to perceived side effects.  She has lost some weight, probably a combination of [place] and better diet.  Check a A1c.  Further advised with results. Dyslipidemia: Check a FLP Visual disturbances: Encouraged to see her eye doctor for a thorough exam.  For completeness we  will do a carotid ultrasound, pulses are present but either diminished or difficult to find due to her anatomy.  No bruit. Flu shot today RTC 4 months

## 2018-05-09 NOTE — Progress Notes (Signed)
Pre visit review using our clinic review tool, if applicable. No additional management support is needed unless otherwise documented below in the visit note. 

## 2018-05-10 NOTE — Assessment & Plan Note (Signed)
DM: Since the last OV she continue w/  Glucophage, Actos, Januvia; also took [place] temporarily; her  A1c dropped to 7.0 (from 7.6).  She however stopped that medication due to perceived side effects.  She has lost some weight, probably a combination of [place] and better diet.  Check a A1c.  Further advised with results. Dyslipidemia: Check a FLP Visual disturbances: Encouraged to see her eye doctor for a thorough exam.  For completeness we will do a carotid ultrasound, pulses are present but either diminished or difficult to find due to her anatomy.  No bruit. Flu shot today RTC 4 months

## 2018-05-12 MED ORDER — ATORVASTATIN CALCIUM 20 MG PO TABS: 20.0000 mg | ORAL_TABLET | Freq: Every day | ORAL | 3 refills | Status: DC

## 2018-05-12 NOTE — Addendum Note (Signed)
Addended byConrad Elwood D on: 05/12/2018 02:39 PM   Modules accepted: Orders

## 2018-05-13 ENCOUNTER — Other Ambulatory Visit: Payer: Self-pay | Admitting: Internal Medicine

## 2018-05-13 DIAGNOSIS — H539 Unspecified visual disturbance: Secondary | ICD-10-CM

## 2018-05-16 ENCOUNTER — Ambulatory Visit (HOSPITAL_COMMUNITY)
Admission: RE | Admit: 2018-05-16 | Payer: Managed Care, Other (non HMO) | Source: Ambulatory Visit | Attending: Internal Medicine | Admitting: Internal Medicine

## 2018-05-16 ENCOUNTER — Encounter (HOSPITAL_COMMUNITY): Payer: Managed Care, Other (non HMO)

## 2018-06-16 ENCOUNTER — Encounter (HOSPITAL_COMMUNITY): Payer: Self-pay

## 2018-07-18 ENCOUNTER — Telehealth: Payer: Self-pay | Admitting: Internal Medicine

## 2018-07-18 NOTE — Telephone Encounter (Signed)
Copied from CRM [phone] [id number]. Topic: Quick Communication - Rx Refill/Question >> Jul 18, 2018 12:49 PM Gerrianne ScalePayne, Otto Felkins L wrote: Medication: oxyCODONE-acetaminophen (PERCOCET/ROXICET) 5-325 MG tablet  Has the patient contacted their pharmacy? Yes.  Transfer pt (Agent: If no, request that the patient contact the pharmacy for the refill.) (Agent: If yes, when and what did the pharmacy advise?)  Preferred Pharmacy (with phone number or street name):   [facility] [address] - [place], [place] - [id number] [phone] ([phone] (Fax)    Agent: Please be advised that RX refills may take up to 3 business days. We ask that you follow-up with your pharmacy.

## 2018-07-19 MED ORDER — OXYCODONE-ACETAMINOPHEN 5-325 MG PO TABS: 1.0000 | ORAL_TABLET | ORAL | 0 refills | Status: DC | PRN

## 2018-07-19 NOTE — Telephone Encounter (Signed)
sent 

## 2018-07-19 NOTE — Telephone Encounter (Signed)
Refill request for oxycodone.   Last OV: 05/09/2018 Last Fill: 05/06/2018 #40 AND 0rf  UDS: 11/30/2017 Low risk  NCCR in media from 05/06/2018

## 2018-08-31 ENCOUNTER — Other Ambulatory Visit: Payer: Self-pay | Admitting: Internal Medicine

## 2018-09-13 ENCOUNTER — Encounter: Payer: Self-pay | Admitting: Internal Medicine

## 2018-09-13 ENCOUNTER — Ambulatory Visit (INDEPENDENT_AMBULATORY_CARE_PROVIDER_SITE_OTHER): Payer: 59 | Admitting: Internal Medicine

## 2018-09-13 VITALS — BP 108/72 | HR 72 | Temp 98.2°F | Resp 16 | Ht 67.0 in | Wt 187.5 lb

## 2018-09-13 DIAGNOSIS — E119 Type 2 diabetes mellitus without complications: Secondary | ICD-10-CM

## 2018-09-13 DIAGNOSIS — Z23 Encounter for immunization: Secondary | ICD-10-CM

## 2018-09-13 DIAGNOSIS — Z780 Asymptomatic menopausal state: Secondary | ICD-10-CM

## 2018-09-13 DIAGNOSIS — Z79899 Other long term (current) drug therapy: Secondary | ICD-10-CM

## 2018-09-13 DIAGNOSIS — Z0000 Encounter for general adult medical examination without abnormal findings: Secondary | ICD-10-CM | POA: Diagnosis not present

## 2018-09-13 DIAGNOSIS — Z1231 Encounter for screening mammogram for malignant neoplasm of breast: Secondary | ICD-10-CM

## 2018-09-13 DIAGNOSIS — M542 Cervicalgia: Secondary | ICD-10-CM | POA: Diagnosis not present

## 2018-09-13 DIAGNOSIS — Z122 Encounter for screening for malignant neoplasm of respiratory organs: Secondary | ICD-10-CM

## 2018-09-13 LAB — COMPREHENSIVE METABOLIC PANEL
ALBUMIN: 3.9 g/dL (ref 3.5–5.2)
ALT: 12 U/L (ref 0–35)
AST: 12 U/L (ref 0–37)
Alkaline Phosphatase: 68 U/L (ref 39–117)
BILIRUBIN TOTAL: 0.6 mg/dL (ref 0.2–1.2)
BUN: 18 mg/dL (ref 6–23)
CHLORIDE: 106 meq/L (ref 96–112)
CO2: 30 mEq/L (ref 19–32)
Calcium: 9.3 mg/dL (ref 8.4–10.5)
Creatinine, Ser: 0.79 mg/dL (ref 0.40–1.20)
GFR: 74.64 mL/min (ref >60.00)
Glucose, Bld: 162 mg/dL — ABNORMAL HIGH (ref 70–99)
Potassium: 4.5 mEq/L (ref 3.5–5.1)
SODIUM: 142 meq/L (ref 135–145)
Total Protein: 7.4 g/dL (ref 6.0–8.3)

## 2018-09-13 LAB — CBC WITH DIFFERENTIAL/PLATELET
BASOS ABS: 0.1 10*3/uL (ref 0.0–0.1)
BASOS PCT: 0.7 % (ref 0.0–3.0)
EOS ABS: 0.1 10*3/uL (ref 0.0–0.7)
Eosinophils Relative: 1.8 % (ref 0.0–5.0)
HCT: 42.1 % (ref 36.0–46.0)
HEMOGLOBIN: 14.5 g/dL (ref 12.0–15.0)
Lymphocytes Relative: 28.5 % (ref 12.0–46.0)
Lymphs Abs: 2.4 10*3/uL (ref 0.7–4.0)
MCHC: 34.3 g/dL (ref 30.0–36.0)
MCV: 94.2 fl (ref 78.0–100.0)
MONO ABS: 0.6 10*3/uL (ref 0.1–1.0)
Monocytes Relative: 7.3 % (ref 3.0–12.0)
NEUTROS ABS: 5.2 10*3/uL (ref 1.4–7.7)
Neutrophils Relative %: 61.7 % (ref 43.0–77.0)
PLATELETS: 228 10*3/uL (ref 150.0–400.0)
RBC: 4.48 Mil/uL (ref 3.87–5.11)
RDW: 14.5 % (ref 11.5–15.5)
WBC: 8.4 10*3/uL (ref 4.0–10.5)

## 2018-09-13 LAB — B12 AND FOLATE PANEL
Folate: 9.2 ng/mL (ref >5.9)
VITAMIN B 12: 236 pg/mL (ref 211–911)

## 2018-09-13 LAB — HEMOGLOBIN A1C: HEMOGLOBIN A1C: 6.9 % — AB (ref 4.6–6.5)

## 2018-09-13 NOTE — Progress Notes (Signed)
Subjective:    Patient ID: Alexandra Parsons, female    DOB: 02/05/1961, 58 y.o.   MRN: [id number]  DOS:  09/13/2018 Type of visit - description: cpx No major concerns. Was intolerant to Lipitor.  Wt Readings from Last 3 Encounters:  09/13/18 187 lb 8 oz (85 kg)  05/09/18 187 lb 6 oz (85 kg)  12/22/17 194 lb (88 kg)     Review of Systems  A 14 point review of systems is negative    Past Medical History:  Diagnosis Date  . Abnormal Pap smear of cervix    h/o HPV  . Chronic sinusitis, chronic cough (?COPD) 07/16/2009  . Depression   . Diabetes mellitus    type 2  . Dyslipidemia   . Elevated LFTs 11/19/2011  . Hypertension   . Leukocytosis 11/19/2011  . Menopausal syndrome   . Neck pain 05/20/2011  . Sarcoid    post partum in remission  . Submandibular gland swelling    Right    Past Surgical History:  Procedure Laterality Date  . abdominal plasty remotely    . NASAL SEPTUM SURGERY  12/26/2015  . pilondial cyst removal    . TUBAL LIGATION    . TURBINATE REDUCTION Bilateral 12/26/2015    Social History   Socioeconomic History  . Marital status: Widowed    Spouse name: Not on file  . Number of children: 1  . Years of education: Not on file  . Highest education level: Not on file  Occupational History  . Occupation: [employer]  Social Needs  . Financial resource strain: Not on file  . Food insecurity:    Worry: Not on file    Inability: Not on file  . Transportation needs:    Medical: Not on file    Non-medical: Not on file  Tobacco Use  . Smoking status: Current Every Day Smoker    Years: 0.50  . Smokeless tobacco: Never Used  . Tobacco comment: 1 to 0.6 ppd (started age 58)  Substance and Sexual Activity  . Alcohol use: Yes    Alcohol/week: 0.0 standard drinks    Comment: rare  . Drug use: No  . Sexual activity: Not on file  Lifestyle  . Physical activity:    Days per week: Not on file    Minutes per session: Not on file  . Stress:  Not on file  Relationships  . Social connections:    Talks on phone: Not on file    Gets together: Not on file    Attends religious service: Not on file    Active member of club or organization: Not on file    Attends meetings of clubs or organizations: Not on file    Relationship status: Not on file  . Intimate partner violence:    Fear of current or ex partner: Not on file    Emotionally abused: Not on file    Physically abused: Not on file    Forced sexual activity: Not on file  Other Topics Concern  . Not on file  Social History Narrative   Lost husband, prostate cancer, 08-2012   Lives by herself   Has a daughter , lives in [place]     Family History  Problem Relation Age of Onset  . Hypertension Other        GM  . Colon cancer Other        COLON CA GGM x 2  . CAD Other  GF at age 14  . Diabetes Other        aunt  . Stroke Neg Hx   . Breast cancer Neg Hx      Allergies as of 09/13/2018      Reactions   Erythromycin    REACTION: palpitations; ZPACK is ok   Fluoxetine Hcl    REACTION: hallucinations   Venlafaxine    REACTION: made her feel bad      Medication List       Accurate as of September 13, 2018  5:55 PM. Always use your most recent med list.        aspirin EC 81 MG tablet Take 81 mg by mouth daily.   bisoprolol-hydrochlorothiazide 2.5-6.25 MG tablet Commonly known as:  ZIAC Take 1 tablet by mouth daily.   escitalopram 20 MG tablet Commonly known as:  LEXAPRO Take 1 tablet (20 mg total) by mouth daily.   metFORMIN 1000 MG tablet Commonly known as:  GLUCOPHAGE Take 1 tablet (1,000 mg total) by mouth 2 (two) times daily with a meal.   oxyCODONE-acetaminophen 5-325 MG tablet Commonly known as:  PERCOCET/ROXICET Take 1 tablet by mouth every 4 (four) hours as needed.   pioglitazone 30 MG tablet Commonly known as:  ACTOS Take 1 tablet (30 mg total) by mouth daily.   PROBIOTIC DAILY PO Take by mouth.   sitaGLIPtin 100 MG  tablet Commonly known as:  JANUVIA Take 1 tablet (100 mg total) by mouth daily.           Objective:   Physical Exam BP 108/72 (BP Location: Left Arm, Patient Position: Sitting, Cuff Size: Normal)   Pulse 72   Temp 98.2 F (36.8 C) (Oral)   Resp 16   Ht 5\' 7"  (1.702 m)   Wt 187 lb 8 oz (85 kg)   SpO2 98%   BMI 29.37 kg/m  General: Well developed, NAD, BMI noted Neck: No  thyromegaly  HEENT:  Normocephalic . Face symmetric, atraumatic Lungs:  CTA B Normal respiratory effort, no intercostal retractions, no accessory muscle use. Heart: RRR,  no murmur.  No pretibial edema bilaterally  Abdomen:  Not distended, soft, non-tender. No rebound or rigidity.   Skin: Exposed areas without rash. Not pale. Not jaundice Neurologic:  alert & oriented X3.  Speech normal, gait appropriate for age and unassisted Strength symmetric and appropriate for age.  Psych: Cognition and judgment appear intact.  Cooperative with normal attention span and concentration.  Behavior appropriate. No anxious or depressed appearing.     Assessment     Assessment DM HTN Dyslipidemia: intolerant to zocor ~ 2014 and  Lipitor 04/2018 Vit d def  -- Smoker ~1  ppd Psych: --Depression --ADHD MSK: Neck pain, chronic, on pain meds prn, UDS 09-2013 risk; intolerant to vicodin, oxycodone ok GYN: --BTL, Menopausal , h/o  Abnormal Pap smear, HPV Elevated LFTs: Hepatitis serology (-) 2008 H/o Sarcoid postpartum in remission H/o Chronic cough: Chronic sinusitis? COPD? H/o Leukocytosis  H/o +  PPD   PLAN DM: Currently on metformin, Actos, Januvia., check a A1c and a B12. HTN: BPs were satisfactory but decided to take a whole Ziac just to make BP better.  Checking labs today. Dyslipidemia: Based on her LDL of 124, was prescribed Lipitor, she reported intolerance due to increased CBGs and thirst.  Also reports previous intolerance to simvastatin.  Recommended Pravachol --- declined. Vitamin D deficiency:  States is not taking supplements, encouraged to do so. Neck pain, chronic: UDS and  contract today,   rarely takes oxycodone. RTC 6 months

## 2018-09-13 NOTE — Patient Instructions (Addendum)
Per our records you are due for an eye exam. Please contact your eye doctor to schedule an appointment. Please have them send copies of your office visit notes to [place]. Our fax number is [phone].   GO TO THE LAB : Get the blood work     GO TO THE FRONT DESK Schedule your next appointment   for a checkup in 6 months Also get nurse visit 2 months from today for your second Shingrix.   You will be due for a colonoscopy in few months.    Take vitamin D OTC daily approximately 1000 units  You are overdue to see your gynecologist, please call them.  If you need a referral let me know  We are arranging for : Bone density test CT of the lungs Mammogram Please go to the first floor and schedule.

## 2018-09-13 NOTE — Assessment & Plan Note (Signed)
DM: Currently on metformin, Actos, Januvia., check a A1c and a B12. HTN: BPs were satisfactory but decided to take a whole Ziac just to make BP better.  Checking labs today. Dyslipidemia: Based on her LDL of 124, was prescribed Lipitor, she reported intolerance due to increased CBGs and thirst.  Also reports previous intolerance to simvastatin.  Recommended Pravachol --- declined. Vitamin D deficiency: States is not taking supplements, encouraged to do so. Neck pain, chronic: UDS and contract today,   rarely takes oxycodone. RTC 6 months

## 2018-09-13 NOTE — Assessment & Plan Note (Addendum)
-  Td 08/2017; pnm 23-- 2017; no prevnar per guidelines; s/p flu shot ; shingrix discussed , #1 today, RTC 2 months  -- CCS: 03-2009  Cscope neg, next in 10 years ; aware is due in few months. --female care : per [facility] > MMP PAP 2017.  Strongly encouraged her to proceed with gynecology visit.  Offered referral. --lung ca screening pro-cons discussed, likes to proceed  --DEXA failed twice, will try again --Diet-exercise: Discussed - Tobacco: risks discussed (CAD-Cancer); options discussed (meds), rec to  call when ready  --Labs: CMP, CBC, A1c.  Also B12 due to metformin use.

## 2018-09-13 NOTE — Progress Notes (Signed)
Pre visit review using our clinic review tool, if applicable. No additional management support is needed unless otherwise documented below in the visit note. 

## 2018-09-14 LAB — PAIN MGMT, PROFILE 8 W/CONF, U
6 Acetylmorphine: NEGATIVE ng/mL (ref <10)
Alcohol Metabolites: NEGATIVE ng/mL (ref <500)
Amphetamines: NEGATIVE ng/mL (ref <500)
BENZODIAZEPINES: NEGATIVE ng/mL (ref <100)
Buprenorphine, Urine: NEGATIVE ng/mL (ref <5)
Cocaine Metabolite: NEGATIVE ng/mL (ref <150)
Creatinine: >350 mg/dL
MDMA: NEGATIVE ng/mL (ref <500)
Marijuana Metabolite: NEGATIVE ng/mL (ref <20)
Opiates: NEGATIVE ng/mL (ref <100)
Oxidant: NEGATIVE ug/mL (ref <200)
Oxycodone: NEGATIVE ng/mL (ref <100)
pH: 6.21 (ref 4.5–9.0)

## 2018-09-20 ENCOUNTER — Ambulatory Visit (HOSPITAL_BASED_OUTPATIENT_CLINIC_OR_DEPARTMENT_OTHER): Payer: 59

## 2018-09-20 ENCOUNTER — Other Ambulatory Visit (HOSPITAL_BASED_OUTPATIENT_CLINIC_OR_DEPARTMENT_OTHER): Payer: 59

## 2018-09-26 ENCOUNTER — Ambulatory Visit (HOSPITAL_BASED_OUTPATIENT_CLINIC_OR_DEPARTMENT_OTHER)
Admission: RE | Admit: 2018-09-26 | Discharge: 2018-09-26 | Disposition: A | Discharge status: Home / Self Care | Payer: 59 | Source: Ambulatory Visit | Attending: Internal Medicine | Admitting: Internal Medicine

## 2018-09-26 ENCOUNTER — Encounter (HOSPITAL_BASED_OUTPATIENT_CLINIC_OR_DEPARTMENT_OTHER): Payer: Self-pay

## 2018-09-26 DIAGNOSIS — Z780 Asymptomatic menopausal state: Secondary | ICD-10-CM | POA: Insufficient documentation

## 2018-09-26 DIAGNOSIS — Z122 Encounter for screening for malignant neoplasm of respiratory organs: Secondary | ICD-10-CM | POA: Insufficient documentation

## 2018-09-26 DIAGNOSIS — Z1231 Encounter for screening mammogram for malignant neoplasm of breast: Secondary | ICD-10-CM | POA: Diagnosis present

## 2018-10-03 ENCOUNTER — Ambulatory Visit (HOSPITAL_BASED_OUTPATIENT_CLINIC_OR_DEPARTMENT_OTHER): Payer: 59

## 2018-10-03 ENCOUNTER — Other Ambulatory Visit (HOSPITAL_BASED_OUTPATIENT_CLINIC_OR_DEPARTMENT_OTHER): Payer: 59

## 2018-10-10 ENCOUNTER — Telehealth: Payer: Self-pay | Admitting: Internal Medicine

## 2018-10-10 MED ORDER — OXYCODONE-ACETAMINOPHEN 5-325 MG PO TABS: 1.0000 | ORAL_TABLET | ORAL | 0 refills | Status: DC | PRN

## 2018-10-10 NOTE — Telephone Encounter (Signed)
sent 

## 2018-10-10 NOTE — Telephone Encounter (Signed)
Copied from CRM [id number]. Topic: Quick Communication - Rx Refill/Question >> Oct 10, 2018 10:27 AM Arlyss Gandy, NT wrote: Medication: oxyCODONE-acetaminophen (PERCOCET/ROXICET) 5-325 MG tablet   Has the patient contacted their pharmacy? Yes.   (Agent: If no, request that the patient contact the pharmacy for the refill.) (Agent: If yes, when and what did the pharmacy advise?)  Preferred Pharmacy (with phone number or street name): [facility] [address] [place], [place] - [address] [phone] (Phone) [phone] (Fax)    Agent: Please be advised that RX refills may take up to 3 business days. We ask that you follow-up with your pharmacy.

## 2018-10-10 NOTE — Telephone Encounter (Signed)
Pt is requesting refill on oxycodone.   Last OV: 09/13/2018 Last Fill: 07/19/2018 #40 and 0RF UDS: 09/13/2018 Low risk

## 2018-10-20 ENCOUNTER — Other Ambulatory Visit: Payer: Self-pay | Admitting: Internal Medicine

## 2018-10-22 ENCOUNTER — Other Ambulatory Visit: Payer: Self-pay | Admitting: Internal Medicine

## 2018-10-22 MED ORDER — BISOPROLOL-HYDROCHLOROTHIAZIDE 2.5-6.25 MG PO TABS: 1.0000 | ORAL_TABLET | Freq: Every day | ORAL | 1 refills | Status: DC

## 2018-11-15 ENCOUNTER — Ambulatory Visit: Payer: 59

## 2018-11-25 ENCOUNTER — Ambulatory Visit (INDEPENDENT_AMBULATORY_CARE_PROVIDER_SITE_OTHER): Payer: 59 | Admitting: Internal Medicine

## 2018-11-25 ENCOUNTER — Other Ambulatory Visit: Payer: Self-pay

## 2018-11-25 ENCOUNTER — Telehealth: Payer: 59 | Admitting: Family

## 2018-11-25 DIAGNOSIS — Z20828 Contact with and (suspected) exposure to other viral communicable diseases: Secondary | ICD-10-CM

## 2018-11-25 DIAGNOSIS — R05 Cough: Secondary | ICD-10-CM

## 2018-11-25 DIAGNOSIS — Z20822 Contact with and (suspected) exposure to covid-19: Secondary | ICD-10-CM

## 2018-11-25 DIAGNOSIS — J439 Emphysema, unspecified: Secondary | ICD-10-CM | POA: Diagnosis not present

## 2018-11-25 DIAGNOSIS — R059 Cough, unspecified: Secondary | ICD-10-CM

## 2018-11-25 NOTE — Progress Notes (Signed)
If you would like to speak to Dr. Drue Novel, call the office. Evisit communicate the care plan with your doctor but is not located in the same department as Dr. Drue Novel. Please let me know if you would like for me to address your concern or if you would like to call your primary doctor.

## 2018-11-25 NOTE — Progress Notes (Signed)
Subjective:    Patient ID: Alexandra Parsons, female    DOB: March 19, 1961, 58 y.o.   MRN: [id number]  DOS:  11/25/2018 Type of visit - description:  Virtual Visit via Video Note  I connected with@ on 11/25/18 at  2:20 PM EDT by a video enabled telemedicine application and verified that I am speaking with the correct person using two identifiers.   THIS ENCOUNTER IS A VIRTUAL VISIT DUE TO COVID-19 - PATIENT WAS NOT SEEN IN THE OFFICE. PATIENT HAS CONSENTED TO VIRTUAL VISIT / TELEMEDICINE VISIT   Location of patient: home  Location of provider: office  I discussed the limitations of evaluation and management by telemedicine and the availability of in person appointments. The patient expressed understanding and agreed to proceed.  History of Present Illness: Acute visit On November 19, 2018, she was in contact with a person whose girlfriend was Covid -19 positive. At the time Alexandra Parsons was wearing a mask but it was down on her chin.  This person did not look sick and she does not know if the person was tested. Her job supervisor told her to stay at home for 14 days and call her doctor reason why she is seen today.  She is doing well.  No major concerns.  Still smoking but has "cut down"   Review of Systems No fever chills She has a smoker's cough at baseline She has occasional body aches from heavy working, at baseline. No chest pain.  Past Medical History:  Diagnosis Date  . Abnormal Pap smear of cervix    h/o HPV  . Chronic sinusitis, chronic cough (?COPD) 07/16/2009  . Depression   . Diabetes mellitus    type 2  . Dyslipidemia   . Elevated LFTs 11/19/2011  . Hypertension   . Leukocytosis 11/19/2011  . Menopausal syndrome   . Neck pain 05/20/2011  . Sarcoid    post partum in remission  . Submandibular gland swelling    Right    Past Surgical History:  Procedure Laterality Date  . abdominal plasty remotely    . NASAL SEPTUM SURGERY  12/26/2015  . pilondial cyst removal    .  TUBAL LIGATION    . TURBINATE REDUCTION Bilateral 12/26/2015    Social History   Socioeconomic History  . Marital status: Widowed    Spouse name: Not on file  . Number of children: 1  . Years of education: Not on file  . Highest education level: Not on file  Occupational History  . Occupation: Charity fundraiser [employer]  Social Needs  . Financial resource strain: Not on file  . Food insecurity:    Worry: Not on file    Inability: Not on file  . Transportation needs:    Medical: Not on file    Non-medical: Not on file  Tobacco Use  . Smoking status: Current Every Day Smoker    Years: 0.50  . Smokeless tobacco: Never Used  . Tobacco comment: 1 to 0.6 ppd (started age 44)  Substance and Sexual Activity  . Alcohol use: Yes    Alcohol/week: 0.0 standard drinks    Comment: rare  . Drug use: No  . Sexual activity: Not on file  Lifestyle  . Physical activity:    Days per week: Not on file    Minutes per session: Not on file  . Stress: Not on file  Relationships  . Social connections:    Talks on phone: Not on file  Gets together: Not on file    Attends religious service: Not on file    Active member of club or organization: Not on file    Attends meetings of clubs or organizations: Not on file    Relationship status: Not on file  . Intimate partner violence:    Fear of current or ex partner: Not on file    Emotionally abused: Not on file    Physically abused: Not on file    Forced sexual activity: Not on file  Other Topics Concern  . Not on file  Social History Narrative   Lost husband, prostate cancer, 08-2012   Lives by herself   Has a daughter , lives in [place]      Allergies as of 11/25/2018      Reactions   Erythromycin    REACTION: palpitations; ZPACK is ok   Fluoxetine Hcl    REACTION: hallucinations   Venlafaxine    REACTION: made her feel bad      Medication List       Accurate as of November 25, 2018  2:23 PM. Always use your most recent med list.         aspirin EC 81 MG tablet Take 81 mg by mouth daily.   bisoprolol-hydrochlorothiazide 2.5-6.25 MG tablet Commonly known as:  ZIAC Take 1 tablet by mouth daily.   escitalopram 20 MG tablet Commonly known as:  LEXAPRO Take 1 tablet (20 mg total) by mouth daily.   metFORMIN 1000 MG tablet Commonly known as:  GLUCOPHAGE Take 1 tablet (1,000 mg total) by mouth 2 (two) times daily with a meal.   oxyCODONE-acetaminophen 5-325 MG tablet Commonly known as:  PERCOCET/ROXICET Take 1 tablet by mouth every 4 (four) hours as needed.   pioglitazone 30 MG tablet Commonly known as:  ACTOS Take 1 tablet (30 mg total) by mouth daily.   PROBIOTIC DAILY PO Take by mouth.   sitaGLIPtin 100 MG tablet Commonly known as:  Januvia Take 1 tablet (100 mg total) by mouth daily.           Objective:   Physical Exam There were no vitals taken for this visit. This was a virtual video visit.  Alert oriented x3, in no apparent distress    Assessment     Assessment DM HTN Dyslipidemia: intolerant to zocor ~ 2014 and  Lipitor 04/2018 Vit d def  -- CT 09-2018 ---COPD ---Pulmonary nodules  --- (+)  Coronary calcium   Smoker ~1  ppd Psych: --Depression --ADHD MSK: Neck pain, chronic, on pain meds prn, UDS 09-2013 risk; intolerant to vicodin, oxycodone ok GYN: --BTL, Menopausal , h/o  Abnormal Pap smear, HPV Elevated LFTs: Hepatitis serology (-) 2008 H/o Sarcoid postpartum in remission H/o Chronic cough: Chronic sinusitis? COPD? H/o Leukocytosis  H/o +  PPD   PLAN Video visit Exposure to COVID-19?  As described above, currently has no symptoms, recommend to stay at home for 14 days and continue observing all the CDC guidelines regards COVID-19 prevention.  If she develops some symptoms she is to let me know, if she has severe symptoms: Go to the ER. CT results: Since she is here, we discussed her recent CT from 09-2018 1.  Evidence of emphysema, patient aware, main treatment is to stop  tobacco 2.  Multiple small pulmonary nodules, plan is to redo a CT 03-2019. 3.  Evidence of calcium accumulation in the coronary arteries: Patient aware, she likely has some degree of CAD but no symptoms at  this point.  At some point will refer to cardiology, coronary arteriogram?  Stress test?  Most important is to be sure that she continue her efforts to quit tobacco and have her chronic medical problems controlled Next office visit 03-2019, will call and schedule.       I discussed the assessment and treatment plan with the patient. The patient was provided an opportunity to ask questions and all were answered. The patient agreed with the plan and demonstrated an understanding of the instructions.   The patient was advised to call back or seek an in-person evaluation if the symptoms worsen or if the condition fails to improve as anticipated.

## 2018-11-26 DIAGNOSIS — J449 Chronic obstructive pulmonary disease, unspecified: Secondary | ICD-10-CM | POA: Insufficient documentation

## 2018-11-26 NOTE — Assessment & Plan Note (Signed)
  Video visit Exposure to COVID-19?  As described above, currently has no symptoms, recommend to stay at home for 14 days and continue observing all the CDC guidelines regards COVID-19 prevention.  If she develops some symptoms she is to let me know, if she has severe symptoms: Go to the ER. CT results: Since she is here, we discussed her recent CT from 09-2018 1.  Evidence of emphysema, patient aware, main treatment is to stop tobacco 2.  Multiple small pulmonary nodules, plan is to redo a CT 03-2019. 3.  Evidence of calcium accumulation in the coronary arteries: Patient aware, she likely has some degree of CAD but no symptoms at this point.  At some point will refer to cardiology, coronary arteriogram?  Stress test?  Most important is to be sure that she continue her efforts to quit tobacco and have her chronic medical problems controlled Next office visit 03-2019, will call and schedule.

## 2018-12-24 ENCOUNTER — Other Ambulatory Visit: Payer: Self-pay | Admitting: Internal Medicine

## 2018-12-26 ENCOUNTER — Telehealth: Payer: Self-pay

## 2018-12-26 MED ORDER — OXYCODONE-ACETAMINOPHEN 5-325 MG PO TABS: 1.0000 | ORAL_TABLET | ORAL | 0 refills | Status: DC | PRN

## 2018-12-26 NOTE — Telephone Encounter (Signed)
Oxycodone refill.   Last OV: 11/25/2018 Last Fill: 10/10/2018 #40 and 0RF Pt sig: 1 tab q4h prn UDS: 09/13/2018 Low risk

## 2018-12-26 NOTE — Telephone Encounter (Signed)
Sent!

## 2019-03-06 ENCOUNTER — Encounter: Payer: Self-pay | Admitting: Gastroenterology

## 2019-03-27 ENCOUNTER — Other Ambulatory Visit: Payer: Self-pay

## 2019-03-28 ENCOUNTER — Ambulatory Visit: Payer: 59 | Admitting: Internal Medicine

## 2019-04-12 ENCOUNTER — Telehealth: Payer: Self-pay | Admitting: Internal Medicine

## 2019-04-12 DIAGNOSIS — R911 Solitary pulmonary nodule: Secondary | ICD-10-CM

## 2019-04-12 DIAGNOSIS — R918 Other nonspecific abnormal finding of lung field: Secondary | ICD-10-CM

## 2019-04-12 NOTE — Telephone Encounter (Signed)
Please arrange a CT chest without contrast.  DX pulmonary nodules follow-up. Also due for office visit, schedule at her convenience

## 2019-04-13 NOTE — Telephone Encounter (Signed)
Order placed. Mychart message sent to Pt to make her aware of CT order and that she is due for visit w/ PCP.

## 2019-06-01 ENCOUNTER — Telehealth: Payer: Self-pay | Admitting: Internal Medicine

## 2019-06-01 MED ORDER — OXYCODONE-ACETAMINOPHEN 5-325 MG PO TABS: 1.0000 | ORAL_TABLET | ORAL | 0 refills | Status: DC | PRN

## 2019-06-01 NOTE — Telephone Encounter (Signed)
Sent!

## 2019-06-01 NOTE — Telephone Encounter (Signed)
Medication Refill - Medication: oxyCODONE-acetaminophen (PERCOCET/ROXICET) 5-325 MG tablet    Has the patient contacted their pharmacy? Yes.   (Agent: If no, request that the patient contact the pharmacy for the refill.) (Agent: If yes, when and what did the pharmacy advise?)  Preferred Pharmacy (with phone number or street name):  [facility] [address] [place], [place] - [address]  [address] [place] [address]  Phone: [phone] Fax: [phone]     Agent: Please be advised that RX refills may take up to 3 business days. We ask that you follow-up with your pharmacy.

## 2019-06-01 NOTE — Telephone Encounter (Signed)
Oxycodone refill.   Last OV: 11/25/2018 Last Fill: 12/26/2018 #40 and 0RF Pt sig: 1 tab q4h prn UDS: 09/13/2018 Low risk

## 2019-06-01 NOTE — Telephone Encounter (Signed)
Requested medication (s) are due for refill today: yes  Requested medication (s) are on the active medication list: yes  Last refill:  12/26/2018  Future visit scheduled: yes  Notes to clinic:  Refill cannot be delegated    Requested Prescriptions  Pending Prescriptions Disp Refills   oxyCODONE-acetaminophen (PERCOCET/ROXICET) 5-325 MG tablet 40 tablet 0    Sig: Take 1 tablet by mouth every 4 (four) hours as needed.     Not Delegated - Analgesics:  Opioid Agonist Combinations Failed - 06/01/2019  3:07 PM      Failed - This refill cannot be delegated      Failed - Valid encounter within last 6 months    Recent Outpatient Visits          6 months ago Pulmonary emphysema, unspecified emphysema type ([facility])   [facility], MD   8 months ago Annual physical exam   [facility], MD   1 year ago High cholesterol   [facility], MD   1 year ago Bronchitis with bronchospasm   [facility], NP   1 year ago Type 2 diabetes mellitus without complication, unspecified whether long term insulin use ([facility])   [facility], MD      Future Appointments            In 6 days Colon Branch, MD [facility] completed in last 360 days.

## 2019-06-05 ENCOUNTER — Other Ambulatory Visit: Payer: Self-pay | Admitting: Internal Medicine

## 2019-06-07 ENCOUNTER — Ambulatory Visit: Payer: 59 | Admitting: Internal Medicine

## 2019-06-08 ENCOUNTER — Other Ambulatory Visit: Payer: Self-pay

## 2019-06-08 ENCOUNTER — Encounter: Payer: Self-pay | Admitting: Internal Medicine

## 2019-06-08 ENCOUNTER — Ambulatory Visit (INDEPENDENT_AMBULATORY_CARE_PROVIDER_SITE_OTHER): Payer: 59 | Admitting: Internal Medicine

## 2019-06-08 VITALS — BP 137/63 | HR 70 | Temp 96.5°F | Resp 16 | Ht 67.0 in | Wt 195.5 lb

## 2019-06-08 DIAGNOSIS — E119 Type 2 diabetes mellitus without complications: Secondary | ICD-10-CM | POA: Diagnosis not present

## 2019-06-08 DIAGNOSIS — I10 Essential (primary) hypertension: Secondary | ICD-10-CM

## 2019-06-08 DIAGNOSIS — R079 Chest pain, unspecified: Secondary | ICD-10-CM | POA: Diagnosis not present

## 2019-06-08 DIAGNOSIS — E7800 Pure hypercholesterolemia, unspecified: Secondary | ICD-10-CM | POA: Diagnosis not present

## 2019-06-08 DIAGNOSIS — M542 Cervicalgia: Secondary | ICD-10-CM

## 2019-06-08 DIAGNOSIS — Z23 Encounter for immunization: Secondary | ICD-10-CM

## 2019-06-08 DIAGNOSIS — Z720 Tobacco use: Secondary | ICD-10-CM | POA: Insufficient documentation

## 2019-06-08 DIAGNOSIS — G8929 Other chronic pain: Secondary | ICD-10-CM

## 2019-06-08 LAB — COMPREHENSIVE METABOLIC PANEL
ALT: 12 U/L (ref 0–35)
AST: 15 U/L (ref 0–37)
Albumin: 4 g/dL (ref 3.5–5.2)
Alkaline Phosphatase: 62 U/L (ref 39–117)
BUN: 16 mg/dL (ref 6–23)
CO2: 30 mEq/L (ref 19–32)
Calcium: 9.2 mg/dL (ref 8.4–10.5)
Chloride: 102 mEq/L (ref 96–112)
Creatinine, Ser: 0.79 mg/dL (ref 0.40–1.20)
GFR: 74.45 mL/min (ref >60.00)
Glucose, Bld: 103 mg/dL — ABNORMAL HIGH (ref 70–99)
Potassium: 3.8 mEq/L (ref 3.5–5.1)
Sodium: 137 mEq/L (ref 135–145)
Total Bilirubin: 0.6 mg/dL (ref 0.2–1.2)
Total Protein: 7.2 g/dL (ref 6.0–8.3)

## 2019-06-08 LAB — CBC WITH DIFFERENTIAL/PLATELET
Basophils Absolute: 0.1 10*3/uL (ref 0.0–0.1)
Basophils Relative: 0.9 % (ref 0.0–3.0)
Eosinophils Absolute: 0.1 10*3/uL (ref 0.0–0.7)
Eosinophils Relative: 1.3 % (ref 0.0–5.0)
HCT: 38.2 % (ref 36.0–46.0)
Hemoglobin: 13 g/dL (ref 12.0–15.0)
Lymphocytes Relative: 29.4 % (ref 12.0–46.0)
Lymphs Abs: 2.9 10*3/uL (ref 0.7–4.0)
MCHC: 34.1 g/dL (ref 30.0–36.0)
MCV: 93.9 fl (ref 78.0–100.0)
Monocytes Absolute: 0.6 10*3/uL (ref 0.1–1.0)
Monocytes Relative: 6.6 % (ref 3.0–12.0)
Neutro Abs: 6 10*3/uL (ref 1.4–7.7)
Neutrophils Relative %: 61.8 % (ref 43.0–77.0)
Platelets: 219 10*3/uL (ref 150.0–400.0)
RBC: 4.07 Mil/uL (ref 3.87–5.11)
RDW: 14.1 % (ref 11.5–15.5)
WBC: 9.8 10*3/uL (ref 4.0–10.5)

## 2019-06-08 LAB — LIPID PANEL
Cholesterol: 174 mg/dL (ref 0–200)
HDL: 23.4 mg/dL — ABNORMAL LOW (ref >39.00)
LDL Cholesterol: 112 mg/dL — ABNORMAL HIGH (ref 0–99)
NonHDL: 150.73
Total CHOL/HDL Ratio: 7
Triglycerides: 193 mg/dL — ABNORMAL HIGH (ref 0.0–149.0)
VLDL: 38.6 mg/dL (ref 0.0–40.0)

## 2019-06-08 LAB — HEMOGLOBIN A1C: Hgb A1c MFr Bld: 7.1 % — ABNORMAL HIGH (ref 4.6–6.5)

## 2019-06-08 NOTE — Progress Notes (Signed)
Subjective:    Patient ID: Alexandra Parsons, female    DOB: 1960-10-18, 58 y.o.   MRN: [id number]  DOS:  06/08/2019 Type of visit - description: Routine visit Anxiety: A lot of stress at work, they demand she works sometimes 70 hours a week. HTN: Good compliance with medication, no ambulatory BPs DM: Diet has not been the best, she thinks her A1c will be a little higher than before.  No ambulatory CBGs Chronic pain: Unchanged, on oxycodone chronically.   Review of Systems Denies nausea, vomiting, diarrhea. Reports chest pain: Mid anterior chest, associated with stress, "when they are yelling at me at work".  No radiation, no associated nausea or sweats.  No exertional.  All normal activities without symptoms  Past Medical History:  Diagnosis Date  . Abnormal Pap smear of cervix    h/o HPV  . Chronic sinusitis, chronic cough (?COPD) 07/16/2009  . Depression   . Diabetes mellitus    type 2  . Dyslipidemia   . Elevated LFTs 11/19/2011  . Hypertension   . Leukocytosis 11/19/2011  . Menopausal syndrome   . Neck pain 05/20/2011  . Sarcoid    post partum in remission  . Submandibular gland swelling    Right    Past Surgical History:  Procedure Laterality Date  . abdominal plasty remotely    . NASAL SEPTUM SURGERY  12/26/2015  . pilondial cyst removal    . TUBAL LIGATION    . TURBINATE REDUCTION Bilateral 12/26/2015    Social History   Socioeconomic History  . Marital status: Widowed    Spouse name: Not on file  . Number of children: 1  . Years of education: Not on file  . Highest education level: Not on file  Occupational History  . Occupation: Charity fundraiser [employer]  Social Needs  . Financial resource strain: Not on file  . Food insecurity    Worry: Not on file    Inability: Not on file  . Transportation needs    Medical: Not on file    Non-medical: Not on file  Tobacco Use  . Smoking status: Current Every Day Smoker    Years: 0.50  . Smokeless tobacco: Never  Used  . Tobacco comment: 1 to 0.6 ppd (started age 69)  Substance and Sexual Activity  . Alcohol use: Yes    Alcohol/week: 0.0 standard drinks    Comment: rare  . Drug use: No  . Sexual activity: Not on file  Lifestyle  . Physical activity    Days per week: Not on file    Minutes per session: Not on file  . Stress: Not on file  Relationships  . Social Musician on phone: Not on file    Gets together: Not on file    Attends religious service: Not on file    Active member of club or organization: Not on file    Attends meetings of clubs or organizations: Not on file    Relationship status: Not on file  . Intimate partner violence    Fear of current or ex partner: Not on file    Emotionally abused: Not on file    Physically abused: Not on file    Forced sexual activity: Not on file  Other Topics Concern  . Not on file  Social History Narrative   Lost husband, prostate cancer, 08-2012   Lives by herself   Has a daughter , lives in [place]  Allergies as of 06/08/2019      Reactions   Erythromycin    REACTION: palpitations; ZPACK is ok   Fluoxetine Hcl    REACTION: hallucinations   Venlafaxine    REACTION: made her feel bad      Medication List       Accurate as of June 08, 2019  5:28 PM. If you have any questions, ask your nurse or doctor.        aspirin EC 81 MG tablet Take 81 mg by mouth daily.   bisoprolol-hydrochlorothiazide 2.5-6.25 MG tablet Commonly known as: ZIAC Take 1 tablet by mouth daily.   escitalopram 20 MG tablet Commonly known as: LEXAPRO Take 1 tablet (20 mg total) by mouth daily.   metFORMIN 1000 MG tablet Commonly known as: GLUCOPHAGE Take 1 tablet (1,000 mg total) by mouth 2 (two) times daily with a meal.   oxyCODONE-acetaminophen 5-325 MG tablet Commonly known as: PERCOCET/ROXICET Take 1 tablet by mouth every 4 (four) hours as needed.   pioglitazone 30 MG tablet Commonly known as: ACTOS Take 1 tablet (30 mg total)  by mouth daily.   PROBIOTIC DAILY PO Take by mouth.   sitaGLIPtin 100 MG tablet Commonly known as: Januvia Take 1 tablet (100 mg total) by mouth daily.           Objective:   Physical Exam BP 137/63 (BP Location: Left Arm, Patient Position: Sitting, Cuff Size: Small)   Pulse 70   Temp (!) 96.5 F (35.8 C) (Temporal)   Resp 16   Ht 5\' 7"  (1.702 m)   Wt 195 lb 8 oz (88.7 kg)   SpO2 97%   BMI 30.62 kg/m   General:   Well developed, NAD, BMI noted.  HEENT:  Normocephalic . Face symmetric, atraumatic Lungs:  CTA B Normal respiratory effort, no intercostal retractions, no accessory muscle use. Heart: RRR,  no murmur.  no pretibial edema bilaterally  Abdomen:  Not distended, soft, non-tender. No rebound or rigidity.   Skin: Not pale. Not jaundice Neurologic:  alert & oriented X3.  Speech normal, gait appropriate for age and unassisted Psych--  Cognition and judgment appear intact.  Cooperative with normal attention span and concentration.  Behavior appropriate. No anxious or depressed appearing.     Assessment     Assessment DM HTN Dyslipidemia: intolerant to zocor ~ 2014 and  Lipitor 04/2018 Vit d def  -- CT 09-2018 ---COPD ---Pulmonary nodules  --- (+)  Coronary calcium   Smoker ~1  ppd Psych: --Depression --ADHD MSK: Neck pain, chronic, on pain meds prn, UDS 09-2013 risk; intolerant to vicodin, oxycodone ok GYN: --BTL, Menopausal , h/o  Abnormal Pap smear, HPV Elevated LFTs: Hepatitis serology (-) 2008 H/o Sarcoid postpartum in remission H/o Chronic cough: Chronic sinusitis? COPD? H/o Leukocytosis  H/o +  PPD   PLAN DM: Diet has not been as good as before, good compliance with metformin, Actos, Januvia.  No ambulatory CBGs.  Check A1c HTN: On bisoprolol/HCTZ, BP today is very good, no ambulatory BPs.  Check a CMP and CBC Dyslipidemia: Intolerant to simvastatin and Lipitor.  Diet controlled, check a FLP Pulmonary nodules: Advised patient rationale  behind checking a f/u  CT, she declined further imaging, reports she had history of sarcoidosis and  years ago CTs were done and she is pretty sure she had nodules already. Tobacco abuse: Not ready to quit due to stress Depression, stress: Extremely stressed and overwhelmed due to her work schedule, she reports works sometimes 70  hours a week.  Does not like to take more medication for anxiety, on Lexapro.  Extensive listening therapy provided. Requests a  letter stating my medical opinion regards the situation.  See letter, rec  not to work  > 40 hours on any given 7-day period.  Chest pain: Atypical, EKG today NSR. She does have coronary calcium per CT.  We will get a stress test. Chronic neck pain: Still an issue, that is adding to her stress, has not seen Ortho in a while, on chronic oxycodone, refer to Ortho for reassessment. Preventive care: Flu shot today RTC 4 months  Time spent 36 minutes

## 2019-06-08 NOTE — Patient Instructions (Addendum)
Per our records you are due for an eye exam. Please contact your eye doctor to schedule an appointment. Please have them send copies of your office visit notes to [place]. Our fax number is [phone].   GO TO THE LAB : Get the blood work     GO TO THE FRONT DESK Schedule your next appointment   for a checkup in 4 months    Check the  blood pressure weekly BP GOAL is between 110/65 and  135/85. If it is consistently higher or lower, let me know

## 2019-06-08 NOTE — Assessment & Plan Note (Signed)
DM: Diet has not been as good as before, good compliance with metformin, Actos, Januvia.  No ambulatory CBGs.  Check A1c HTN: On bisoprolol/HCTZ, BP today is very good, no ambulatory BPs.  Check a CMP and CBC Dyslipidemia: Intolerant to simvastatin and Lipitor.  Diet controlled, check a FLP Pulmonary nodules: Advised patient rationale behind checking a f/u  CT, she declined further imaging, reports she had history of sarcoidosis and  years ago CTs were done and she is pretty sure she had nodules already. Tobacco abuse: Not ready to quit due to stress Depression, stress: Extremely stressed and overwhelmed due to her work schedule, she reports works sometimes 70 hours a week.  Does not like to take more medication for anxiety, on Lexapro.  Extensive listening therapy provided. Requests a  letter stating my medical opinion regards the situation.  See letter, rec  not to work  > 40 hours on any given 7-day period.  Chest pain: Atypical, EKG today NSR. She does have coronary calcium per CT.  We will get a stress test. Chronic neck pain: Still an issue, that is adding to her stress, has not seen Ortho in a while, on chronic oxycodone, refer to Ortho for reassessment. Preventive care: Flu shot today RTC 4 months

## 2019-06-08 NOTE — Progress Notes (Signed)
Pre visit review using our clinic review tool, if applicable. No additional management support is needed unless otherwise documented below in the visit note. 

## 2019-06-09 MED ORDER — EZETIMIBE 10 MG PO TABS: 10.0000 mg | ORAL_TABLET | Freq: Every day | ORAL | 2 refills | Status: DC

## 2019-06-09 NOTE — Addendum Note (Signed)
Addended by: Damita Dunnings D on: 06/09/2019 03:13 PM   Modules accepted: Orders

## 2019-06-13 ENCOUNTER — Encounter (HOSPITAL_COMMUNITY): Payer: Self-pay | Admitting: Internal Medicine

## 2019-06-20 ENCOUNTER — Telehealth: Payer: Self-pay

## 2019-06-20 DIAGNOSIS — Z0279 Encounter for issue of other medical certificate: Secondary | ICD-10-CM

## 2019-06-20 NOTE — Telephone Encounter (Signed)
Form faxed to [employer] at [phone]. Form sent for scanning. Received fax confirmation.

## 2019-06-20 NOTE — Telephone Encounter (Signed)
FMLA/special accomodations form received from [employer]. Form placed in PCP red folder for completion.

## 2019-06-20 NOTE — Telephone Encounter (Signed)
Completed, please keep a copy

## 2019-06-26 ENCOUNTER — Telehealth (HOSPITAL_COMMUNITY): Payer: Self-pay

## 2019-06-26 NOTE — Telephone Encounter (Signed)
Noted, thank you

## 2019-06-26 NOTE — Telephone Encounter (Signed)
New message    Just an FYI. We have made several attempts to contact this patient including sending a letter to schedule or reschedule their echocardiogram. We will be removing the patient from the echo WQ.   11.13.20 @ 3:14pm lm on home vm - Alexandra Parsons  11.9.20@ 4:19pm patient on vacation will call back - Alexandra Parsons   11.3.20 mail reminder letter Alexandra Parsons  10.29.20 @ 12:11pm  both # are the same lm on home vm  - Alexandra Parsons

## 2019-06-30 ENCOUNTER — Telehealth (HOSPITAL_COMMUNITY): Payer: Self-pay

## 2019-06-30 NOTE — Telephone Encounter (Signed)
Noted  

## 2019-06-30 NOTE — Telephone Encounter (Signed)
New message    Just an FYI. We have made several attempts to contact this patient including sending a letter to schedule or reschedule their echocardiogram. We will be removing the patient from the echo WQ.   11.20.20 @ 3:32pm lm on home vm Kialee Kham  11.13.20 @ 3:14pm lm on home vm - Skila Rollins  11.9.20@ 4:19pm patient on vacation will call back - Jasmina Gendron   11.3.20 mail reminder letter Khing Belcher  10.29.20 @ 12:11pm  both # are the same lm on home vm  - Florice Hindle

## 2019-08-30 ENCOUNTER — Telehealth: Payer: Self-pay | Admitting: Internal Medicine

## 2019-08-30 NOTE — Telephone Encounter (Signed)
Patient called to get a refill on RX Oxycodone.

## 2019-08-30 NOTE — Telephone Encounter (Signed)
Oxycodone refill.   Last OV: 06/08/2019 Last Fill: 06/01/2019 #40 and 0RF Pt sig: 1 tab q4h prn UDS: 09/13/2018 Low risk

## 2019-08-31 MED ORDER — OXYCODONE-ACETAMINOPHEN 5-325 MG PO TABS: 1.0000 | ORAL_TABLET | ORAL | 0 refills | Status: DC | PRN

## 2019-08-31 NOTE — Telephone Encounter (Signed)
Sent!

## 2019-10-05 ENCOUNTER — Encounter: Payer: Self-pay | Admitting: Internal Medicine

## 2019-10-05 ENCOUNTER — Other Ambulatory Visit: Payer: Self-pay

## 2019-10-05 ENCOUNTER — Ambulatory Visit (INDEPENDENT_AMBULATORY_CARE_PROVIDER_SITE_OTHER): Payer: 59 | Admitting: Internal Medicine

## 2019-10-05 VITALS — BP 137/62 | HR 65 | Temp 97.6°F | Resp 16 | Ht 67.0 in | Wt 197.1 lb

## 2019-10-05 DIAGNOSIS — I10 Essential (primary) hypertension: Secondary | ICD-10-CM | POA: Diagnosis not present

## 2019-10-05 DIAGNOSIS — E119 Type 2 diabetes mellitus without complications: Secondary | ICD-10-CM | POA: Diagnosis not present

## 2019-10-05 DIAGNOSIS — Z79899 Other long term (current) drug therapy: Secondary | ICD-10-CM | POA: Diagnosis not present

## 2019-10-05 DIAGNOSIS — M542 Cervicalgia: Secondary | ICD-10-CM | POA: Diagnosis not present

## 2019-10-05 LAB — BASIC METABOLIC PANEL
BUN: 14 mg/dL (ref 6–23)
CO2: 32 mEq/L (ref 19–32)
Calcium: 9.9 mg/dL (ref 8.4–10.5)
Chloride: 103 mEq/L (ref 96–112)
Creatinine, Ser: 0.82 mg/dL (ref 0.40–1.20)
GFR: 71.24 mL/min (ref >60.00)
Glucose, Bld: 135 mg/dL — ABNORMAL HIGH (ref 70–99)
Potassium: 4.4 mEq/L (ref 3.5–5.1)
Sodium: 139 mEq/L (ref 135–145)

## 2019-10-05 NOTE — Progress Notes (Signed)
Subjective:    Patient ID: Alexandra Parsons, female    DOB: 1960-11-08, 59 y.o.   MRN: [id number]  DOS:  10/05/2019 Type of visit - description: Routine visit Today we talked about her diabetes, cholesterol, anxiety, and hypertension. In general doing well. Very seldom checks ambulatory CBGs. Sometimes forgets Zetia   Wt Readings from Last 3 Encounters:  10/05/19 197 lb 2 oz (89.4 kg)  06/08/19 195 lb 8 oz (88.7 kg)  09/13/18 187 lb 8 oz (85 kg)     Review of Systems Currently with no chest pain, denies lower extremity edema. No lower extremity paresthesias No nausea, vomiting, diarrhea  Past Medical History:  Diagnosis Date  . Abnormal Pap smear of cervix    h/o HPV  . Chronic sinusitis, chronic cough (?COPD) 07/16/2009  . Depression   . Diabetes mellitus    type 2  . Dyslipidemia   . Elevated LFTs 11/19/2011  . Hypertension   . Leukocytosis 11/19/2011  . Menopausal syndrome   . Neck pain 05/20/2011  . Sarcoid    post partum in remission  . Submandibular gland swelling    Right    Past Surgical History:  Procedure Laterality Date  . abdominal plasty remotely    . NASAL SEPTUM SURGERY  12/26/2015  . pilondial cyst removal    . TUBAL LIGATION    . TURBINATE REDUCTION Bilateral 12/26/2015    Allergies as of 10/05/2019      Reactions   Erythromycin    REACTION: palpitations; ZPACK is ok   Fluoxetine Hcl    REACTION: hallucinations   Venlafaxine    REACTION: made her feel bad      Medication List       Accurate as of October 05, 2019 11:59 PM. If you have any questions, ask your nurse or doctor.        aspirin EC 81 MG tablet Take 81 mg by mouth daily.   bisoprolol-hydrochlorothiazide 2.5-6.25 MG tablet Commonly known as: ZIAC Take 1 tablet by mouth daily.   docusate sodium 100 MG capsule Commonly known as: COLACE Take 100 mg by mouth daily as needed for moderate constipation.   escitalopram 20 MG tablet Commonly known as: LEXAPRO Take 1 tablet  (20 mg total) by mouth daily.   ezetimibe 10 MG tablet Commonly known as: Zetia Take 1 tablet (10 mg total) by mouth daily.   metFORMIN 1000 MG tablet Commonly known as: GLUCOPHAGE Take 1 tablet (1,000 mg total) by mouth 2 (two) times daily with a meal.   oxyCODONE-acetaminophen 5-325 MG tablet Commonly known as: PERCOCET/ROXICET Take 1 tablet by mouth every 4 (four) hours as needed.   pioglitazone 30 MG tablet Commonly known as: ACTOS Take 1 tablet (30 mg total) by mouth daily.   PROBIOTIC DAILY PO Take by mouth.   sitaGLIPtin 100 MG tablet Commonly known as: Januvia Take 1 tablet (100 mg total) by mouth daily.             Objective:   Physical Exam BP 137/62 (BP Location: Left Arm, Patient Position: Sitting, Cuff Size: Normal)   Pulse 65   Temp 97.6 F (36.4 C) (Temporal)   Resp 16   Ht 5\' 7"  (1.702 m)   Wt 197 lb 2 oz (89.4 kg)   SpO2 98%   BMI 30.87 kg/m  General:   Well developed, NAD, BMI noted. HEENT:  Normocephalic . Face symmetric, atraumatic Lungs:  CTA B Normal respiratory effort, no intercostal retractions, no accessory  muscle use. Heart: RRR,  no murmur.  Lower extremities: no pretibial edema bilaterally DM foot exam: Declined Skin: Not pale. Not jaundice Neurologic:  alert & oriented X3.  Speech normal, gait appropriate for age and unassisted Psych--  Cognition and judgment appear intact.  Cooperative with normal attention span and concentration.  Behavior appropriate. No anxious or depressed appearing.      Assessment      Assessment DM HTN Dyslipidemia: intolerant to zocor ~ 2014 and  Lipitor 04/2018 Vit d def   CT 09-2018 ---COPD ---Pulmonary nodules, declined further CTs on 10-20 20 --- (+)  Coronary calcium   Smoker ~1  ppd Psych: --Depression --ADHD MSK: Neck pain, chronic, on pain meds prn, UDS 09-2013 risk; intolerant to vicodin, oxycodone ok GYN: --BTL, Menopausal , h/o  Abnormal Pap smear, HPV Elevated LFTs:  Hepatitis serology (-) 2008 H/o Sarcoid postpartum in remission H/o Chronic cough: Chronic sinusitis? COPD? H/o Leukocytosis  H/o +  PPD   PLAN DM: Very active at work, very seldom check ambulatory, will check they are  ~ 160s.  Recommend to check daily, goals provided.  Continue Actos, Metformin, Januvia, check a A1c. HTN: On bisoprolol HCTZ, ambulatory BPs in the 120/70.  Check a BMP Dyslipidemia: Started Zetia 05-2019, occasionally forgets, encourage good compliance, recheck on RTC Depression, stress: Since the last office visit, is doing much better, work hours are now reasonable.  Continue Lexapro. Chest pain: See last visit, decided not to pursue a stress test, symptoms resolved, she truly believes this was stress related. Chronic neck pain, on oxycodone as needed, PDMP okay, UDS today. RTC 3 months CPX    This visit occurred during the SARS-CoV-2 public health emergency.  Safety protocols were in place, including screening questions prior to the visit, additional usage of staff PPE, and extensive cleaning of exam room while observing appropriate contact time as indicated for disinfecting solutions.

## 2019-10-05 NOTE — Patient Instructions (Addendum)
Per our records you are due for an eye exam. Please contact your eye doctor to schedule an appointment. Please have them send copies of your office visit notes to [place]. Our fax number is [phone].   GO TO THE LAB : Get the blood work     GO TO THE FRONT DESK Come back for a physical exam in 3 months, please make an appointment   Continue checking your blood pressures BP GOAL is between 110/65 and  135/85. If it is consistently higher or lower, let me know   Diabetes: Check your blood sugar  once a day     at different times of the day GOALS: Fasting before a meal 70- 130 2 hours after a meal less than 180

## 2019-10-05 NOTE — Progress Notes (Signed)
Pre visit review using our clinic review tool, if applicable. No additional management support is needed unless otherwise documented below in the visit note. 

## 2019-10-06 LAB — PAIN MGMT, PROFILE 8 W/CONF, U
6 Acetylmorphine: NEGATIVE ng/mL
Alcohol Metabolites: NEGATIVE ng/mL (ref <500)
Amphetamines: NEGATIVE ng/mL
Benzodiazepines: NEGATIVE ng/mL
Buprenorphine, Urine: NEGATIVE ng/mL
Cocaine Metabolite: NEGATIVE ng/mL
Creatinine: >300 mg/dL
MDMA: NEGATIVE ng/mL
Marijuana Metabolite: NEGATIVE ng/mL
Opiates: NEGATIVE ng/mL
Oxidant: NEGATIVE ug/mL
Oxycodone: NEGATIVE ng/mL
pH: 5.4 (ref 4.5–9.0)

## 2019-10-06 LAB — HEMOGLOBIN A1C: Hgb A1c MFr Bld: 7.8 % — ABNORMAL HIGH (ref 4.6–6.5)

## 2019-10-07 NOTE — Assessment & Plan Note (Signed)
DM: Very active at work, very seldom check ambulatory, will check they are  ~ 160s.  Recommend to check daily, goals provided.  Continue Actos, Metformin, Januvia, check a A1c. HTN: On bisoprolol HCTZ, ambulatory BPs in the 120/70.  Check a BMP Dyslipidemia: Started Zetia 05-2019, occasionally forgets, encourage good compliance, recheck on RTC Depression, stress: Since the last office visit, is doing much better, work hours are now reasonable.  Continue Lexapro. Chest pain: See last visit, decided not to pursue a stress test, symptoms resolved, she truly believes this was stress related. Chronic neck pain, on oxycodone as needed, PDMP okay, UDS today. RTC 3 months CPX

## 2019-10-10 MED ORDER — RYBELSUS 7 MG PO TABS: 1.0000 | ORAL_TABLET | Freq: Every day | ORAL | 3 refills | Status: DC

## 2019-10-10 MED ORDER — RYBELSUS 3 MG PO TABS: 1.0000 | ORAL_TABLET | Freq: Every day | ORAL | 0 refills | Status: DC

## 2019-10-10 NOTE — Addendum Note (Signed)
Addended byConrad [place] D on: 10/10/2019 11:19 AM   Modules accepted: Orders

## 2019-11-13 ENCOUNTER — Other Ambulatory Visit: Payer: Self-pay | Admitting: Internal Medicine

## 2019-12-12 ENCOUNTER — Telehealth: Payer: Self-pay | Admitting: Internal Medicine

## 2019-12-12 MED ORDER — OXYCODONE-ACETAMINOPHEN 5-325 MG PO TABS: 1.0000 | ORAL_TABLET | ORAL | 0 refills | Status: DC | PRN

## 2019-12-12 NOTE — Telephone Encounter (Signed)
PDMP okay, Rx sent 

## 2019-12-12 NOTE — Telephone Encounter (Signed)
Medication: oxyCODONE-acetaminophen (PERCOCET/ROXICET  Has the patient contacted their pharmacy? No. (If no, request that the patient contact the pharmacy for the refill.) (If yes, when and what did the pharmacy advise?)  Preferred Pharmacy (with phone number or street name):  [facility] [address] [place], [place] - [address] Phone:  [phone]  Fax:  [phone]      Agent: Please be advised that RX refills may take up to 3 business days. We ask that you follow-up with your pharmacy.

## 2019-12-12 NOTE — Telephone Encounter (Signed)
Oxycodone refill.   Last OV: 10/05/19 Last Fill: 08/31/2019 #40 and 0RF Pt sig: 1 tab q4h prn UDS: 10/05/2019 Low risk

## 2020-01-04 ENCOUNTER — Encounter: Payer: 59 | Admitting: Internal Medicine

## 2020-01-04 ENCOUNTER — Encounter: Payer: Self-pay | Admitting: Internal Medicine

## 2020-01-09 ENCOUNTER — Other Ambulatory Visit: Payer: Self-pay | Admitting: Internal Medicine

## 2020-01-23 ENCOUNTER — Encounter: Payer: Self-pay | Admitting: Internal Medicine

## 2020-01-23 ENCOUNTER — Other Ambulatory Visit: Payer: Self-pay

## 2020-01-23 ENCOUNTER — Ambulatory Visit (INDEPENDENT_AMBULATORY_CARE_PROVIDER_SITE_OTHER): Payer: 59 | Admitting: Internal Medicine

## 2020-01-23 VITALS — BP 138/63 | HR 66 | Temp 96.1°F | Resp 18 | Ht 67.0 in | Wt 195.0 lb

## 2020-01-23 DIAGNOSIS — M542 Cervicalgia: Secondary | ICD-10-CM

## 2020-01-23 DIAGNOSIS — E119 Type 2 diabetes mellitus without complications: Secondary | ICD-10-CM

## 2020-01-23 DIAGNOSIS — F329 Major depressive disorder, single episode, unspecified: Secondary | ICD-10-CM

## 2020-01-23 DIAGNOSIS — Z09 Encounter for follow-up examination after completed treatment for conditions other than malignant neoplasm: Secondary | ICD-10-CM | POA: Diagnosis not present

## 2020-01-23 DIAGNOSIS — Z0001 Encounter for general adult medical examination with abnormal findings: Secondary | ICD-10-CM | POA: Diagnosis not present

## 2020-01-23 DIAGNOSIS — R911 Solitary pulmonary nodule: Secondary | ICD-10-CM | POA: Diagnosis not present

## 2020-01-23 DIAGNOSIS — F419 Anxiety disorder, unspecified: Secondary | ICD-10-CM

## 2020-01-23 DIAGNOSIS — Z0000 Encounter for general adult medical examination without abnormal findings: Secondary | ICD-10-CM

## 2020-01-23 DIAGNOSIS — F32A Depression, unspecified: Secondary | ICD-10-CM

## 2020-01-23 DIAGNOSIS — R079 Chest pain, unspecified: Secondary | ICD-10-CM

## 2020-01-23 LAB — COMPREHENSIVE METABOLIC PANEL
ALT: 17 U/L (ref 0–35)
AST: 20 U/L (ref 0–37)
Albumin: 4 g/dL (ref 3.5–5.2)
Alkaline Phosphatase: 64 U/L (ref 39–117)
BUN: 19 mg/dL (ref 6–23)
CO2: 28 mEq/L (ref 19–32)
Calcium: 9.4 mg/dL (ref 8.4–10.5)
Chloride: 101 mEq/L (ref 96–112)
Creatinine, Ser: 0.86 mg/dL (ref 0.40–1.20)
GFR: 67.36 mL/min (ref >60.00)
Glucose, Bld: 150 mg/dL — ABNORMAL HIGH (ref 70–99)
Potassium: 4.5 mEq/L (ref 3.5–5.1)
Sodium: 136 mEq/L (ref 135–145)
Total Bilirubin: 0.5 mg/dL (ref 0.2–1.2)
Total Protein: 7.5 g/dL (ref 6.0–8.3)

## 2020-01-23 LAB — HEMOGLOBIN A1C: Hgb A1c MFr Bld: 9.1 % — ABNORMAL HIGH (ref 4.6–6.5)

## 2020-01-23 MED ORDER — OXYCODONE-ACETAMINOPHEN 5-325 MG PO TABS: 1.0000 | ORAL_TABLET | ORAL | 0 refills | Status: DC | PRN

## 2020-01-23 NOTE — Progress Notes (Addendum)
Subjective:    Patient ID: Alexandra Parsons, female    DOB: [birth year], 59 y.o.   MRN: [id number]  DOS:  01/23/2020 Type of visit - description: CPX In addition to CPX, multiple other issues discussed   Review of Systems Still has occasional chest pain, only associated with the stress at work.  Not when she is active at home.  Other than above, a 14 point review of systems is negative      Past Medical History:  Diagnosis Date  . Abnormal Pap smear of cervix    h/o HPV  . Chronic sinusitis, chronic cough (?COPD) 07/16/2009  . Depression   . Diabetes mellitus    type 2  . Dyslipidemia   . Elevated LFTs 11/19/2011  . Hypertension   . Leukocytosis 11/19/2011  . Menopausal syndrome   . Neck pain 05/20/2011  . Sarcoid    post partum in remission  . Submandibular gland swelling    Right    Past Surgical History:  Procedure Laterality Date  . abdominal plasty remotely    . NASAL SEPTUM SURGERY  12/26/2015  . pilondial cyst removal    . TUBAL LIGATION    . TURBINATE REDUCTION Bilateral 12/26/2015   Family History  Problem Relation Age of Onset  . Hypertension Other        GM  . Colon cancer Other        COLON CA GGM x 2  . Diabetes Other        aunt  . CAD Maternal Grandfather 45  . Stroke Neg Hx   . Breast cancer Neg Hx      Allergies as of 01/23/2020      Reactions   Erythromycin    REACTION: palpitations; ZPACK is ok   Fluoxetine Hcl    REACTION: hallucinations   Venlafaxine    REACTION: made her feel bad      Medication List       Accurate as of January 23, 2020 11:59 PM. If you have any questions, ask your nurse or doctor.        STOP taking these medications   Rybelsus 3 MG Tabs Generic drug: Semaglutide Stopped by: Willow Ora, MD   Rybelsus 7 MG Tabs Generic drug: Semaglutide Stopped by: Willow Ora, MD     TAKE these medications   aspirin EC 81 MG tablet Take 81 mg by mouth daily.   bisoprolol-hydrochlorothiazide 2.5-6.25 MG tablet Commonly  known as: ZIAC Take 1 tablet by mouth daily.   docusate sodium 100 MG capsule Commonly known as: COLACE Take 100 mg by mouth daily as needed for moderate constipation.   escitalopram 20 MG tablet Commonly known as: LEXAPRO Take 1 tablet (20 mg total) by mouth daily.   ezetimibe 10 MG tablet Commonly known as: Zetia Take 1 tablet (10 mg total) by mouth daily.   metFORMIN 1000 MG tablet Commonly known as: GLUCOPHAGE Take 1 tablet (1,000 mg total) by mouth 2 (two) times daily with a meal.   oxyCODONE-acetaminophen 5-325 MG tablet Commonly known as: PERCOCET/ROXICET Take 1 tablet by mouth every 4 (four) hours as needed.   pioglitazone 30 MG tablet Commonly known as: ACTOS Take 1 tablet (30 mg total) by mouth daily.   PROBIOTIC DAILY PO Take by mouth.   sitaGLIPtin 100 MG tablet Commonly known as: Januvia Take 1 tablet (100 mg total) by mouth daily.          Objective:   Physical Exam BP 138/63 (  BP Location: Left Arm, Patient Position: Sitting, Cuff Size: Small)   Pulse 66   Temp (!) 96.1 F (35.6 C) (Temporal)   Resp 18   Ht 5\' 7"  (1.702 m)   Wt 195 lb (88.5 kg)   SpO2 97%   BMI 30.54 kg/m  General: Well developed, NAD, BMI noted Neck: No  thyromegaly  HEENT:  Normocephalic . Face symmetric, atraumatic Lungs:  CTA B Normal respiratory effort, no intercostal retractions, no accessory muscle use. Heart: RRR,  no murmur.  Abdomen:  Not distended, soft, non-tender. No rebound or rigidity.   Lower extremities: no pretibial edema bilaterally  Skin: Exposed areas without rash. Not pale. Not jaundice Neurologic:  alert & oriented X3.  Speech normal, gait appropriate for age and unassisted Strength symmetric and appropriate for age.  Psych: Cognition and judgment appear intact.  Cooperative with normal attention span and concentration.  Behavior appropriate. No anxious or depressed appearing.     Assessment    Assessment DM HTN Dyslipidemia:  intolerant to zocor ~ 2014 and  Lipitor 04/2018 Vit d def   CT 09-2018 ---COPD ---Pulmonary nodules, declined further CTs on 05-2019 --- (+)  Coronary calcium   Smoker ~1  ppd Psych: --Depression --ADHD MSK: Neck pain, chronic, on pain meds prn, UDS 09-2013 risk; intolerant to vicodin, oxycodone ok GYN: --BTL, Menopausal , h/o  Abnormal Pap smear, HPV Elevated LFTs: Hepatitis serology (-) 2008 H/o Sarcoid postpartum in remission H/o Chronic cough: Chronic sinusitis? COPD? H/o Leukocytosis  H/o +  PPD   PLAN Here for CPX DM: On Metformin, Actos, Januvia.  Took Rybelsus temporarily, it worked great, + weight loss, better sugars but then she developed blurry vision and she thinks that is a s/e.  Very reluctant to take it again.  Check a A1c HTN: BP today is very good, reports good ambulatory BPs, continue bisoprolol HCTZ. Dyslipidemia: On Zetia Pulmonary nodules: Per CT 09-2018, recommend follow-up CT (noting that she had sarcoidosis postpartum). Depression, anxiety: Ongoing stress at work, request FMLA, recommend to see a counselor and/or a psychiatrist before I feel comfortable signing her  FMLA. Chronic neck pain:On oxycodone, currently getting 40 tablets a month, would like to go back to 60 tablets.  Last year I recommended a Ortho reassessment, it did not happen, states that she will call schedule an appointment Chest pain: Ongoing, cardiology referral. RTC 3 months  In addition to CPX, we address multiple other issues, see above.   This visit occurred during the SARS-CoV-2 public health emergency.  Safety protocols were in place, including screening questions prior to the visit, additional usage of staff PPE, and extensive cleaning of exam room while observing appropriate contact time as indicated for disinfecting solutions.

## 2020-01-23 NOTE — Progress Notes (Signed)
Pre visit review using our clinic review tool, if applicable. No additional management support is needed unless otherwise documented below in the visit note. 

## 2020-01-23 NOTE — Patient Instructions (Addendum)
Per our records you are due for an eye exam. Please contact your eye doctor to schedule an appointment. Please have them send copies of your office visit notes to [place]. Our fax number is [phone].  Check your blood pressure weekly BP GOAL is between 110/65 and  135/85. If it is consistently higher or lower, let me know  Refer to cardiology for chest pain.  ER if severe chest pain Schedule a CT of the chest Recommend to see a counselor Recommend to see your gynecologist regularly  GO TO THE LAB : Get the blood work     GO TO THE FRONT DESK, PLEASE SCHEDULE YOUR APPOINTMENTS Come back for checkup in 3 months  STOP BY THE FIRST FLOOR:  get the XR     Steps to Quit Smoking Smoking tobacco is the leading cause of preventable death. It can affect almost every organ in the body. Smoking puts you and those around you at risk for developing many serious chronic diseases. Quitting smoking can be difficult, but it is one of the best things that you can do for your health. It is never too late to quit. How do I get ready to quit? When you decide to quit smoking, create a plan to help you succeed. Before you quit:  Pick a date to quit. Set a date within the next 2 weeks to give you time to prepare.  Write down the reasons why you are quitting. Keep this list in places where you will see it often.  Tell your family, friends, and co-workers that you are quitting. Support from your loved ones can make quitting easier.  Talk with your health care provider about your options for quitting smoking.  Find out what treatment options are covered by your health insurance.  Identify people, places, things, and activities that make you want to smoke (triggers). Avoid them. What first steps can I take to quit smoking?  Throw away all cigarettes at home, at work, and in your car.  Throw away smoking accessories, such as Set designer.  Clean your car. Make sure to empty the  ashtray.  Clean your home, including curtains and carpets. What strategies can I use to quit smoking? Talk with your health care provider about combining strategies, such as taking medicines while you are also receiving in-person counseling. Using these two strategies together makes you more likely to succeed in quitting than if you used either strategy on its own.  If you are pregnant or breastfeeding, talk with your health care provider about finding counseling or other support strategies to quit smoking. Do not take medicine to help you quit smoking unless your health care provider tells you to do so. To quit smoking: Quit right away  Quit smoking completely, instead of gradually reducing how much you smoke over a period of time. Research shows that stopping smoking right away is more successful than gradually quitting.  Attend in-person counseling to help you build problem-solving skills. You are more likely to succeed in quitting if you attend counseling sessions regularly. Even short sessions of 10 minutes can be effective. Take medicine You may take medicines to help you quit smoking. Some medicines require a prescription and some you can purchase over-the-counter. Medicines may have nicotine in them to replace the nicotine in cigarettes. Medicines may:  Help to stop cravings.  Help to relieve withdrawal symptoms. Your health care provider may recommend:  Nicotine patches, gum, or lozenges.  Nicotine inhalers or sprays.  Non-nicotine medicine that is taken by mouth. Find resources Find resources and support systems that can help you to quit smoking and remain smoke-free after you quit. These resources are most helpful when you use them often. They include:  Online chats with a Social worker.  Telephone quitlines.  Printed Furniture conservator/restorer.  Support groups or group counseling.  Text messaging programs.  Mobile phone apps or applications. Use apps that can help you stick to  your quit plan by providing reminders, tips, and encouragement. There are many free apps for mobile devices as well as websites. Examples include Quit Guide from the [employer] and smokefree.gov What things can I do to make it easier to quit?   Reach out to your family and friends for support and encouragement. Call telephone quitlines (1-800-QUIT-NOW), reach out to support groups, or work with a counselor for support.  Ask people who smoke to avoid smoking around you.  Avoid places that trigger you to smoke, such as bars, parties, or smoke-break areas at work.  Spend time with people who do not smoke.  Lessen the stress in your life. Stress can be a smoking trigger for some people. To lessen stress, try: ? Exercising regularly. ? Doing deep-breathing exercises. ? Doing yoga. ? Meditating. ? Performing a body scan. This involves closing your eyes, scanning your body from head to toe, and noticing which parts of your body are particularly tense. Try to relax the muscles in those areas. How will I feel when I quit smoking? Day 1 to 3 weeks Within the first 24 hours of quitting smoking, you may start to feel withdrawal symptoms. These symptoms are usually most noticeable 2-3 days after quitting, but they usually do not last for more than 2-3 weeks. You may experience these symptoms:  Mood swings.  Restlessness, anxiety, or irritability.  Trouble concentrating.  Dizziness.  Strong cravings for sugary foods and nicotine.  Mild weight gain.  Constipation.  Nausea.  Coughing or a sore throat.  Changes in how the medicines that you take for unrelated issues work in your body.  Depression.  Trouble sleeping (insomnia). Week 3 and afterward After the first 2-3 weeks of quitting, you may start to notice more positive results, such as:  Improved sense of smell and taste.  Decreased coughing and sore throat.  Slower heart rate.  Lower blood pressure.  Clearer skin.  The ability  to breathe more easily.  Fewer sick days. Quitting smoking can be very challenging. Do not get discouraged if you are not successful the first time. Some people need to make many attempts to quit before they achieve long-term success. Do your best to stick to your quit plan, and talk with your health care provider if you have any questions or concerns. Summary  Smoking tobacco is the leading cause of preventable death. Quitting smoking is one of the best things that you can do for your health.  When you decide to quit smoking, create a plan to help you succeed.  Quit smoking right away, not slowly over a period of time.  When you start quitting, seek help from your health care provider, family, or friends. This information is not intended to replace advice given to you by your health care provider. Make sure you discuss any questions you have with your health care provider. Document Revised: 04/21/2019 Document Reviewed: 10/15/2018 Elsevier Patient Education  [employer].

## 2020-01-24 NOTE — Assessment & Plan Note (Addendum)
Here for CPX DM: On Metformin, Actos, Januvia.  Took Rybelsus temporarily, it worked great, + weight loss, better sugars but then she developed blurry vision and she thinks that is a s/e.  Very reluctant to take it again.  Check a A1c HTN: BP today is very good, reports good ambulatory BPs, continue bisoprolol HCTZ. Dyslipidemia: On Zetia Pulmonary nodules: Per CT 09-2018, recommend follow-up CT (noting that she had sarcoidosis postpartum). Depression, anxiety: Ongoing stress at work, request FMLA, recommend to see a counselor and/or a psychiatrist before I feel comfortable signing her  FMLA. Chronic neck pain:On oxycodone, currently getting 40 tablets a month, would like to go back to 60 tablets.  Last year I recommended a Ortho reassessment, it did not happen, states that she will call schedule an appointment Chest pain: Ongoing, cardiology referral. RTC 3 months

## 2020-01-24 NOTE — Assessment & Plan Note (Signed)
-  Td 08/2017 -pnm 23-- 2017; prevnar at 64 -shingrix discussed , #1 feb-2020: arm hurt x 30 days , no itching-rash.  Declined a booster today -Had Covid shots -- CCS: 03-2009.  Was due for a repeat in 2020. --female care :  per [facility] 09/2018  .  Recommend to see gynecology --lung ca screening: CT 05-2019, show pulmonary nodules, due for CT to follow-up on pulmonary nodules --DEXA : Normal 09-2018 --Diet-exercise: Discussed - Tobacco: still smoking, counseled --Labs:  CMP A1c

## 2020-01-25 MED ORDER — EMPAGLIFLOZIN 10 MG PO TABS: 10.0000 mg | ORAL_TABLET | Freq: Every day | ORAL | 3 refills | Status: DC

## 2020-01-25 NOTE — Addendum Note (Signed)
Addended byConrad Greenwood D on: 01/25/2020 02:14 PM   Modules accepted: Orders

## 2020-01-31 ENCOUNTER — Telehealth: Payer: Self-pay | Admitting: Internal Medicine

## 2020-01-31 DIAGNOSIS — F17200 Nicotine dependence, unspecified, uncomplicated: Secondary | ICD-10-CM

## 2020-01-31 NOTE — Telephone Encounter (Signed)
Spoke with a physician Animal nutritionist) Because previous pulmonary nodules the right test to order is: CT chest without contrast Follow-up pulmonary nodules CPT code 97353 Confirmation number: [id number]  Please enter new order as above

## 2020-01-31 NOTE — Telephone Encounter (Signed)
Order placed

## 2020-02-02 ENCOUNTER — Other Ambulatory Visit: Payer: Self-pay

## 2020-02-02 DIAGNOSIS — R911 Solitary pulmonary nodule: Secondary | ICD-10-CM

## 2020-02-20 ENCOUNTER — Encounter: Payer: Self-pay | Admitting: Internal Medicine

## 2020-02-23 ENCOUNTER — Other Ambulatory Visit: Payer: Self-pay | Admitting: Internal Medicine

## 2020-02-26 MED ORDER — PIOGLITAZONE HCL 30 MG PO TABS: 30.0000 mg | ORAL_TABLET | Freq: Every day | ORAL | 0 refills | Status: DC

## 2020-02-26 MED ORDER — METFORMIN HCL 1000 MG PO TABS: 1000.0000 mg | ORAL_TABLET | Freq: Two times a day (BID) | ORAL | 0 refills | Status: DC

## 2020-02-26 MED ORDER — BISOPROLOL-HYDROCHLOROTHIAZIDE 2.5-6.25 MG PO TABS: 1.0000 | ORAL_TABLET | Freq: Every day | ORAL | 0 refills | Status: DC

## 2020-02-26 MED ORDER — SITAGLIPTIN PHOSPHATE 100 MG PO TABS: 100.0000 mg | ORAL_TABLET | Freq: Every day | ORAL | 0 refills | Status: DC

## 2020-03-08 ENCOUNTER — Telehealth: Payer: Self-pay | Admitting: Internal Medicine

## 2020-03-08 MED ORDER — OXYCODONE-ACETAMINOPHEN 5-325 MG PO TABS: 1.0000 | ORAL_TABLET | ORAL | 0 refills | Status: DC | PRN

## 2020-03-08 NOTE — Telephone Encounter (Signed)
Oxycodone refill.   Last OV: 01/23/2020 Last Fill: 01/23/2020 #60 and 0RF Pt sig: 1 tab q4h prn UDS: 10/05/2019 Low risk

## 2020-03-08 NOTE — Telephone Encounter (Signed)
New message:   1.Medication Requested: oxyCODONE-acetaminophen (PERCOCET/ROXICET) 5-325 MG tablet 2. Pharmacy (Name, Street, Elizabeth): [facility] - [place], [place] - [address] 3. On Med List: yes  4. Last Visit with PCP:   5. Next visit date with PCP:   Agent: Please be advised that RX refills may take up to 3 business days. We ask that you follow-up with your pharmacy.

## 2020-03-08 NOTE — Telephone Encounter (Signed)
PDMP okay, Rx sent 

## 2020-04-17 ENCOUNTER — Telehealth: Payer: Self-pay | Admitting: Internal Medicine

## 2020-04-17 MED ORDER — OXYCODONE-ACETAMINOPHEN 5-325 MG PO TABS: 1.0000 | ORAL_TABLET | ORAL | 0 refills | Status: DC | PRN

## 2020-04-17 NOTE — Telephone Encounter (Signed)
Medication:oxyCODONE-acetaminophen (PERCOCET/ROXICET) 5-325 MG tablet [[id number]]       Has the patient contacted their pharmacy?  (If no, request that the patient contact the pharmacy for the refill.) (If yes, when and what did the pharmacy advise?)     Preferred Pharmacy (with phone number or street name): [facility] [address] [place], [place] - [address]  [address] [place] [address]  Phone:  [phone] Fax:  [phone]      Agent: Please be advised that RX refills may take up to 3 business days. We ask that you follow-up with your pharmacy.

## 2020-04-17 NOTE — Telephone Encounter (Signed)
PDMP okay, Rx sent 

## 2020-04-17 NOTE — Telephone Encounter (Signed)
Oxycodone refill.   Last OV: 01/23/2020 Last Fill: 03/08/2020 #60 and 0RF Pt sig: 1 tab q4h prn UDS: 10/05/2019 Low risk

## 2020-04-19 ENCOUNTER — Ambulatory Visit: Payer: 59 | Admitting: Medical

## 2020-05-08 ENCOUNTER — Ambulatory Visit: Payer: 59 | Admitting: Internal Medicine

## 2020-05-13 ENCOUNTER — Other Ambulatory Visit: Payer: Self-pay

## 2020-05-13 ENCOUNTER — Encounter: Payer: Self-pay | Admitting: Internal Medicine

## 2020-05-13 ENCOUNTER — Ambulatory Visit (INDEPENDENT_AMBULATORY_CARE_PROVIDER_SITE_OTHER): Payer: 59 | Admitting: Internal Medicine

## 2020-05-13 VITALS — BP 123/84 | HR 69 | Temp 98.1°F | Resp 16 | Ht 67.0 in | Wt 190.0 lb

## 2020-05-13 DIAGNOSIS — Z23 Encounter for immunization: Secondary | ICD-10-CM

## 2020-05-13 DIAGNOSIS — I10 Essential (primary) hypertension: Secondary | ICD-10-CM

## 2020-05-13 DIAGNOSIS — R079 Chest pain, unspecified: Secondary | ICD-10-CM

## 2020-05-13 DIAGNOSIS — E119 Type 2 diabetes mellitus without complications: Secondary | ICD-10-CM

## 2020-05-13 DIAGNOSIS — E7800 Pure hypercholesterolemia, unspecified: Secondary | ICD-10-CM | POA: Diagnosis not present

## 2020-05-13 DIAGNOSIS — R911 Solitary pulmonary nodule: Secondary | ICD-10-CM | POA: Diagnosis not present

## 2020-05-13 NOTE — Assessment & Plan Note (Signed)
DM: Last A1c 9.1, recommended Jardiance, patient declined.  Currently on Metformin, Actos, Januvia.  Diet is not healthy, risk of uncontrolled DM including CAD and stroke reviewed.  Check A1c. HTN: Seems controlled, last CMP satisfactory, continue Ziac. High cholesterol: On Zetia, check FLP, if LDL elevated she would be willing to try low-dose of Crestor. Pulmonary nodules: Declined need CT, aware of potential risk including cancer Chest pain: Did not see cardiology and does not like to be refer.  Aware she could have an underlying heart condition. Flu shot today RTC 4 months

## 2020-05-13 NOTE — Progress Notes (Signed)
Pre visit review using our clinic review tool, if applicable. No additional management support is needed unless otherwise documented below in the visit note. 

## 2020-05-13 NOTE — Patient Instructions (Addendum)
Per our records you are due for an eye exam. Please contact your eye doctor to schedule an appointment. Please have them send copies of your office visit notes to [place]. Our fax number is [phone].  Check the  blood pressure 2 or 3 times a month BP GOAL is between 110/65 and  135/85. If it is consistently higher or lower, let me know    GO TO THE LAB : Get the blood work     GO TO THE FRONT DESK, PLEASE SCHEDULE YOUR APPOINTMENTS Come back for a check up in 4 months

## 2020-05-13 NOTE — Progress Notes (Signed)
Subjective:    Patient ID: Alexandra Parsons, female    DOB: 30-Aug-1960, 59 y.o.   MRN: [id number]  DOS:  05/13/2020 Type of visit - description: Routine visit She continues to be a stress, work-related "I am sick and tired".  She feels she is overworked. Diet has not been the best lately, she remains active. I ask about chest pain "I is still have it sometimes but I feel well most of the time"    Review of Systems See above   Past Medical History:  Diagnosis Date  . Abnormal Pap smear of cervix    h/o HPV  . Chronic sinusitis, chronic cough (?COPD) 07/16/2009  . Depression   . Diabetes mellitus    type 2  . Dyslipidemia   . Elevated LFTs 11/19/2011  . Hypertension   . Leukocytosis 11/19/2011  . Menopausal syndrome   . Neck pain 05/20/2011  . Sarcoid    post partum in remission  . Submandibular gland swelling    Right    Past Surgical History:  Procedure Laterality Date  . abdominal plasty remotely    . NASAL SEPTUM SURGERY  12/26/2015  . pilondial cyst removal    . TUBAL LIGATION    . TURBINATE REDUCTION Bilateral 12/26/2015    Allergies as of 05/13/2020      Reactions   Erythromycin    REACTION: palpitations; ZPACK is ok   Fluoxetine Hcl    REACTION: hallucinations   Venlafaxine    REACTION: made her feel bad      Medication List       Accurate as of May 13, 2020  8:51 PM. If you have any questions, ask your nurse or doctor.        STOP taking these medications   empagliflozin 10 MG Tabs tablet Commonly known as: Jardiance Stopped by: Willow Ora, MD   PROBIOTIC DAILY PO Stopped by: Willow Ora, MD     TAKE these medications   aspirin EC 81 MG tablet Take 81 mg by mouth daily.   bisoprolol-hydrochlorothiazide 2.5-6.25 MG tablet Commonly known as: ZIAC Take 1 tablet by mouth daily.   docusate sodium 100 MG capsule Commonly known as: COLACE Take 100 mg by mouth daily as needed for moderate constipation.   escitalopram 20 MG tablet Commonly known  as: LEXAPRO Take 1 tablet (20 mg total) by mouth daily.   ezetimibe 10 MG tablet Commonly known as: Zetia Take 1 tablet (10 mg total) by mouth daily.   metFORMIN 1000 MG tablet Commonly known as: GLUCOPHAGE Take 1 tablet (1,000 mg total) by mouth 2 (two) times daily with a meal.   oxyCODONE-acetaminophen 5-325 MG tablet Commonly known as: PERCOCET/ROXICET Take 1 tablet by mouth every 4 (four) hours as needed.   pioglitazone 30 MG tablet Commonly known as: ACTOS Take 1 tablet (30 mg total) by mouth daily.   sitaGLIPtin 100 MG tablet Commonly known as: Januvia Take 1 tablet (100 mg total) by mouth daily.          Objective:   Physical Exam BP 123/84 (BP Location: Right Arm, Patient Position: Sitting, Cuff Size: Normal)   Pulse 69   Temp 98.1 F (36.7 C) (Oral)   Resp 16   Ht 5\' 7"  (1.702 m)   Wt 190 lb (86.2 kg)   SpO2 97%   BMI 29.76 kg/m  General:   Well developed, NAD, BMI noted. HEENT:  Normocephalic . Face symmetric, atraumatic Lungs:  CTA B Normal respiratory effort,  no intercostal retractions, no accessory muscle use. Heart: RRR,  no murmur.  Lower extremities: no pretibial edema bilaterally  Skin: Not pale. Not jaundice Neurologic:  alert & oriented X3.  Speech normal, gait appropriate for age and unassisted Psych--  Cognition and judgment appear intact.  Cooperative with normal attention span and concentration.  Behavior appropriate. No anxious or depressed appearing.      Assessment    Assessment DM HTN Dyslipidemia: intolerant to zocor ~ 2014 and  Lipitor 04/2018 Vit d def   CT 09-2018 ---COPD ---Pulmonary nodules, declined further CTs on 05-2019 --- (+)  Coronary calcium   Smoker ~1  ppd Psych: --Depression --ADHD MSK: Neck pain, chronic, on pain meds prn, UDS 09-2013 risk; intolerant to vicodin, oxycodone ok GYN: --BTL, Menopausal , h/o  Abnormal Pap smear, HPV Elevated LFTs: Hepatitis serology (-) 2008 H/o Sarcoid postpartum in  remission H/o Chronic cough: Chronic sinusitis? COPD? H/o Leukocytosis  H/o +  PPD   PLAN DM: Last A1c 9.1, recommended Jardiance, patient declined.  Currently on Metformin, Actos, Januvia.  Diet is not healthy, risk of uncontrolled DM including CAD and stroke reviewed.  Check A1c. HTN: Seems controlled, last CMP satisfactory, continue Ziac. High cholesterol: On Zetia, check FLP, if LDL elevated she would be willing to try low-dose of Crestor. Pulmonary nodules: Declined need CT, aware of potential risk including cancer Chest pain: Did not see cardiology and does not like to be refer.  Aware she could have an underlying heart condition. Flu shot today RTC 4 months    This visit occurred during the SARS-CoV-2 public health emergency.  Safety protocols were in place, including screening questions prior to the visit, additional usage of staff PPE, and extensive cleaning of exam room while observing appropriate contact time as indicated for disinfecting solutions.

## 2020-05-14 LAB — CBC WITH DIFFERENTIAL/PLATELET
Absolute Monocytes: 529 cells/uL (ref 200–950)
Basophils Absolute: 39 cells/uL (ref 0–200)
Basophils Relative: 0.4 %
Eosinophils Absolute: 49 cells/uL (ref 15–500)
Eosinophils Relative: 0.5 %
HCT: 40.6 % (ref 35.0–45.0)
Hemoglobin: 13.9 g/dL (ref 11.7–15.5)
Lymphs Abs: 2234 cells/uL (ref 850–3900)
MCH: 32.6 pg (ref 27.0–33.0)
MCHC: 34.2 g/dL (ref 32.0–36.0)
MCV: 95.1 fL (ref 80.0–100.0)
MPV: 9.4 fL (ref 7.5–12.5)
Monocytes Relative: 5.4 %
Neutro Abs: 6948 cells/uL (ref 1500–7800)
Neutrophils Relative %: 70.9 %
Platelets: 221 10*3/uL (ref 140–400)
RBC: 4.27 10*6/uL (ref 3.80–5.10)
RDW: 13 % (ref 11.0–15.0)
Total Lymphocyte: 22.8 %
WBC: 9.8 10*3/uL (ref 3.8–10.8)

## 2020-05-14 LAB — HEMOGLOBIN A1C
Hgb A1c MFr Bld: 6.4 % of total Hgb — ABNORMAL HIGH (ref <5.7)
Mean Plasma Glucose: 137 (calc)
eAG (mmol/L): 7.6 (calc)

## 2020-05-14 LAB — LIPID PANEL
Cholesterol: 149 mg/dL (ref <200)
HDL: 22 mg/dL — ABNORMAL LOW (ref >=50)
LDL Cholesterol (Calc): 100 mg/dL (calc) — ABNORMAL HIGH
Non-HDL Cholesterol (Calc): 127 mg/dL (calc) (ref <130)
Total CHOL/HDL Ratio: 6.8 (calc) — ABNORMAL HIGH (ref <5.0)
Triglycerides: 178 mg/dL — ABNORMAL HIGH (ref <150)

## 2020-05-21 ENCOUNTER — Telehealth: Payer: Self-pay | Admitting: Internal Medicine

## 2020-05-21 MED ORDER — OXYCODONE-ACETAMINOPHEN 5-325 MG PO TABS
1.0000 | ORAL_TABLET | ORAL | 0 refills | Status: DC | PRN
Start: 2020-05-21 — End: 2020-06-26

## 2020-05-21 NOTE — Telephone Encounter (Signed)
Caller: Cannie Call Back # [phone]  Pt called requesting refill on medication (Percocet) sent to:  [facility] [address] [place], [place] - [address]  [address] [place] [address]  Phone:  [phone] Fax:  [phone]  Please Advise

## 2020-05-21 NOTE — Telephone Encounter (Signed)
Requesting: oxycodone-acetaminophen 5-325mg  Contract: 09/13/2018 UDS: 10/05/2019 Low risk Last Visit: 05/13/2020 Next Visit: 09/16/2020 Last Refill: 04/17/2020 #60 and 0RF Pt sig: 1 tab q4h prn  Please Advise

## 2020-05-21 NOTE — Telephone Encounter (Signed)
PDMP okay, prescription sent 

## 2020-05-22 ENCOUNTER — Other Ambulatory Visit: Payer: Self-pay | Admitting: Internal Medicine

## 2020-05-24 ENCOUNTER — Telehealth: Payer: Self-pay | Admitting: Internal Medicine

## 2020-05-24 NOTE — Telephone Encounter (Signed)
LMOM informing Pt that letter is at front desk for pick up.

## 2020-05-24 NOTE — Telephone Encounter (Signed)
Patient states she needs a copy of a letter written on 06/08/19. She is not able to see it in my chart.

## 2020-06-26 ENCOUNTER — Telehealth: Payer: Self-pay | Admitting: Internal Medicine

## 2020-06-26 MED ORDER — OXYCODONE-ACETAMINOPHEN 5-325 MG PO TABS: 1.0000 | ORAL_TABLET | ORAL | 0 refills | Status: DC | PRN

## 2020-06-26 NOTE — Telephone Encounter (Signed)
PDMP okay, prescription sent 

## 2020-06-26 NOTE — Telephone Encounter (Signed)
Requesting: oxycodone 5-325mg  Contract: 09/13/2018 UDS: 10/05/2019 Last Visit: 05/13/2020 Next Visit: 09/16/2020 Last Refill: 05/21/2020 #60 and 0RF Pt sig: 1 tab q4h prn  Please Advise

## 2020-06-26 NOTE — Telephone Encounter (Signed)
Medication: oxyCODONE-acetaminophen (PERCOCET/ROXICET) 5-325 MG tablet   Has the patient contacted their pharmacy? No. (If no, request that the patient contact the pharmacy for the refill.) (If yes, when and what did the pharmacy advise?)  Preferred Pharmacy (with phone number or street name): [facility] [address] [place], [place] - [address]  [address] [place] [address]  Phone:  [phone] Fax:  [phone]  DEA #:  --  Agent: Please be advised that RX refills may take up to 3 business days. We ask that you follow-up with your pharmacy.

## 2020-07-17 ENCOUNTER — Encounter (HOSPITAL_BASED_OUTPATIENT_CLINIC_OR_DEPARTMENT_OTHER): Payer: Self-pay

## 2020-07-17 ENCOUNTER — Emergency Department (HOSPITAL_BASED_OUTPATIENT_CLINIC_OR_DEPARTMENT_OTHER)
Admission: EM | Admit: 2020-07-17 | Discharge: 2020-07-17 | Disposition: A | Discharge status: Home / Self Care | Payer: 59 | Attending: Emergency Medicine | Admitting: Emergency Medicine

## 2020-07-17 ENCOUNTER — Other Ambulatory Visit: Payer: Self-pay

## 2020-07-17 ENCOUNTER — Emergency Department (HOSPITAL_BASED_OUTPATIENT_CLINIC_OR_DEPARTMENT_OTHER): Payer: 59

## 2020-07-17 DIAGNOSIS — E119 Type 2 diabetes mellitus without complications: Secondary | ICD-10-CM | POA: Insufficient documentation

## 2020-07-17 DIAGNOSIS — F909 Attention-deficit hyperactivity disorder, unspecified type: Secondary | ICD-10-CM | POA: Diagnosis not present

## 2020-07-17 DIAGNOSIS — I10 Essential (primary) hypertension: Secondary | ICD-10-CM | POA: Insufficient documentation

## 2020-07-17 DIAGNOSIS — R519 Headache, unspecified: Secondary | ICD-10-CM | POA: Insufficient documentation

## 2020-07-17 DIAGNOSIS — Z7984 Long term (current) use of oral hypoglycemic drugs: Secondary | ICD-10-CM | POA: Diagnosis not present

## 2020-07-17 DIAGNOSIS — M542 Cervicalgia: Secondary | ICD-10-CM | POA: Insufficient documentation

## 2020-07-17 DIAGNOSIS — S4992XA Unspecified injury of left shoulder and upper arm, initial encounter: Secondary | ICD-10-CM | POA: Diagnosis present

## 2020-07-17 DIAGNOSIS — Z7982 Long term (current) use of aspirin: Secondary | ICD-10-CM | POA: Insufficient documentation

## 2020-07-17 DIAGNOSIS — W19XXXA Unspecified fall, initial encounter: Secondary | ICD-10-CM

## 2020-07-17 DIAGNOSIS — M79652 Pain in left thigh: Secondary | ICD-10-CM | POA: Diagnosis not present

## 2020-07-17 DIAGNOSIS — S42002A Fracture of unspecified part of left clavicle, initial encounter for closed fracture: Secondary | ICD-10-CM | POA: Insufficient documentation

## 2020-07-17 DIAGNOSIS — Z79899 Other long term (current) drug therapy: Secondary | ICD-10-CM | POA: Insufficient documentation

## 2020-07-17 DIAGNOSIS — J449 Chronic obstructive pulmonary disease, unspecified: Secondary | ICD-10-CM | POA: Diagnosis not present

## 2020-07-17 DIAGNOSIS — W07XXXA Fall from chair, initial encounter: Secondary | ICD-10-CM | POA: Insufficient documentation

## 2020-07-17 DIAGNOSIS — F17210 Nicotine dependence, cigarettes, uncomplicated: Secondary | ICD-10-CM | POA: Diagnosis not present

## 2020-07-17 DIAGNOSIS — M25512 Pain in left shoulder: Secondary | ICD-10-CM

## 2020-07-17 MED ORDER — METHOCARBAMOL 500 MG PO TABS: 500.0000 mg | ORAL_TABLET | Freq: Two times a day (BID) | ORAL | 0 refills | Status: AC

## 2020-07-17 MED ORDER — OXYCODONE-ACETAMINOPHEN 5-325 MG PO TABS
1.0000 | ORAL_TABLET | Freq: Three times a day (TID) | ORAL | 0 refills | Status: DC | PRN
Start: 2020-07-17 — End: 2020-07-29

## 2020-07-17 MED ORDER — ACETAMINOPHEN 325 MG PO TABS
650.0000 mg | ORAL_TABLET | Freq: Once | ORAL | Status: AC
  Administered 2020-07-17: 650 mg via ORAL
  Filled 2020-07-17: qty 2

## 2020-07-17 NOTE — Discharge Instructions (Addendum)
As we discussed, your imaging today looked reassuring.  Your x-ray did show evidence of a clavicle fracture that is nondisplaced.  Wear the sling for immobilization.  Call Ortho to arrange follow-up appointment.  You can take Tylenol or Ibuprofen as directed for pain. You can alternate Tylenol and Ibuprofen every 4 hours. If you take Tylenol at 1pm, then you can take Ibuprofen at 5pm. Then you can take Tylenol again at 9pm.   Take pain medications as directed for break through pain. Do not drive or operate machinery while taking this medication.   Take Robaxin as prescribed. This medication will make you drowsy so do not drive or drink alcohol when taking it. Do not take it as the same time as the pain medication.

## 2020-07-17 NOTE — ED Notes (Signed)
Pt states was pushing family member and the wheelchair collapsed causing both of them to fall. Pain in left shoulder, neck and hip/leg.

## 2020-07-17 NOTE — ED Triage Notes (Signed)
Pt states she fall pushing her mother up a ramp ~2pm-pain to left temporal, left side of neck, left LE from hip to knee, left UE from shoulder to elbow-no LOC-NAD-steady gait-pt states EMS was called/on scene-has paper like sling to left UE

## 2020-07-17 NOTE — ED Provider Notes (Signed)
[facility] EMERGENCY DEPARTMENT Provider Note   CSN: [id number] Arrival date & time: 07/17/20  1525     History Chief Complaint  Patient presents with  . Fall    Alexandra Parsons is a 59 y.o. female who presents for evaluation of fall that occurred about 1 PM this afternoon.  Patient reports that she was pushing her mom out of the hospital on a chair walker.  She states that the walker collapsed, causing her mom to fall and the patient to fall.  She states that she hit her head on the cement.  She does not think she had any LOC.  She has been able to ambulate since then.  She states she landed on her left side.  Since then, she has been having pain to the left side of her head, left side of her neck, left shoulder as well as left thigh.  She is on aspirin.  No other blood thinners.  She has not had any nausea/vomiting.  She denies any numbness.  She has difficulty moving the left shoulder secondary to pain.  The history is provided by the patient.       Past Medical History:  Diagnosis Date  . Abnormal Pap smear of cervix    h/o HPV  . Chronic sinusitis, chronic cough (?COPD) 07/16/2009  . Depression   . Diabetes mellitus    type 2  . Dyslipidemia   . Elevated LFTs 11/19/2011  . Hypertension   . Leukocytosis 11/19/2011  . Menopausal syndrome   . Neck pain 05/20/2011  . Sarcoid    post partum in remission  . Submandibular gland swelling    Right    Patient Active Problem List   Diagnosis Date Noted  . Tobacco abuse 06/08/2019  . COPD- per CT 09-2018 11/26/2018  . PCP NOTES >>>>>>>>>>>>>>>>>>>>> 12/30/2015  . Leukocytosis 11/19/2011  . Elevated LFTs 11/19/2011  . Annual physical exam 09/18/2011  . Neck pain-- chronic--UDS 05/20/2011  . Chronic sinusitis, chronic cough (?COPD) 07/16/2009  . ADHD 02/08/2008  . POSITIVE PPD 02/08/2008  . MENOPAUSAL SYNDROME 10/25/2007  . DM w/o complication type II (HCC) 02/10/2007  . High cholesterol 02/10/2007  . Anxiety and  depression 02/10/2007  . HTN (hypertension) 02/10/2007    Past Surgical History:  Procedure Laterality Date  . abdominal plasty remotely    . NASAL SEPTUM SURGERY  12/26/2015  . pilondial cyst removal    . TUBAL LIGATION    . TURBINATE REDUCTION Bilateral 12/26/2015     OB History   No obstetric history on file.     Family History  Problem Relation Age of Onset  . Hypertension Other        GM  . Colon cancer Other        COLON CA GGM x 2  . Diabetes Other        aunt  . CAD Maternal Grandfather 45  . Stroke Neg Hx   . Breast cancer Neg Hx     Social History   Tobacco Use  . Smoking status: Current Every Day Smoker    Years: 0.50  . Smokeless tobacco: Never Used  . Tobacco comment: 1 to 0.6 ppd (started age 30)  Substance Use Topics  . Alcohol use: Yes    Alcohol/week: 0.0 standard drinks    Comment: rare  . Drug use: No    Home Medications Prior to Admission medications   Medication Sig Start Date End Date Taking? Authorizing Provider  aspirin EC 81 MG tablet Take 81 mg by mouth daily.    [provider]  bisoprolol-hydrochlorothiazide (ZIAC) 2.5-6.25 MG tablet Take 1 tablet by mouth daily. 05/23/20   Wanda PlumpPaz, Jose E, MD  docusate sodium (COLACE) 100 MG capsule Take 100 mg by mouth daily as needed for moderate constipation.    [provider]  escitalopram (LEXAPRO) 20 MG tablet Take 1 tablet (20 mg total) by mouth daily. 05/23/20   Wanda PlumpPaz, Jose E, MD  ezetimibe (ZETIA) 10 MG tablet Take 1 tablet (10 mg total) by mouth daily. 06/09/19   Wanda PlumpPaz, Jose E, MD  metFORMIN (GLUCOPHAGE) 1000 MG tablet Take 1 tablet (1,000 mg total) by mouth 2 (two) times daily with a meal. 05/23/20   Wanda PlumpPaz, Jose E, MD  methocarbamol (ROBAXIN) 500 MG tablet Take 1 tablet (500 mg total) by mouth 2 (two) times daily for 5 days. 07/17/20 07/22/20  Maxwell CaulLayden, Palin Tristan A, PA-C  oxyCODONE-acetaminophen (PERCOCET/ROXICET) 5-325 MG tablet Take 1 tablet by mouth every 4 (four) hours as needed.  06/26/20   Wanda PlumpPaz, Jose E, MD  oxyCODONE-acetaminophen (PERCOCET/ROXICET) 5-325 MG tablet Take 1-2 tablets by mouth every 8 (eight) hours as needed for severe pain. 07/17/20   Graciella FreerLayden, Naw Lasala A, PA-C  pioglitazone (ACTOS) 30 MG tablet Take 1 tablet (30 mg total) by mouth daily. 05/23/20   Wanda PlumpPaz, Jose E, MD  sitaGLIPtin (JANUVIA) 100 MG tablet Take 1 tablet (100 mg total) by mouth daily. 05/23/20   Wanda PlumpPaz, Jose E, MD    Allergies    Erythromycin, Fluoxetine hcl, and Venlafaxine  Review of Systems   Review of Systems  Musculoskeletal: Positive for neck pain. Negative for back pain.       Shoulder pain Leg pain  Neurological: Positive for headaches. Negative for weakness and numbness.  All other systems reviewed and are negative.   Physical Exam Updated Vital Signs BP 138/80 (BP Location: Right Arm)   Pulse 78   Temp 97.6 F (36.4 C) (Oral)   Resp 16   Ht 5\' 6"  (1.676 m)   Wt 86.2 kg   SpO2 98%   BMI 30.67 kg/m   Physical Exam Vitals and nursing note reviewed.  Constitutional:      Appearance: She is well-developed.  HENT:     Head: Normocephalic and atraumatic.     Comments: No tenderness to palpation of skull. No deformities or crepitus noted. No open wounds, abrasions or lacerations.  Eyes:     General: No scleral icterus.       Right eye: No discharge.        Left eye: No discharge.     Conjunctiva/sclera: Conjunctivae normal.  Neck:     Comments: Full flexion/extension and lateral movement of neck fully intact. She does have some pain with lateral movement. No bony midline tenderness. No deformities or crepitus. Diffuse tenderness noted to the left paraspinal muscles.  Cardiovascular:     Pulses:          Radial pulses are 2+ on the right side and 2+ on the left side.  Pulmonary:     Effort: Pulmonary effort is normal.  Musculoskeletal:     Comments: Tenderness palpation noted to left shoulder.  No deformity or crepitus noted.  Limited range of motion secondary to pain.   No bony tenderness noted to left elbow, left forearm, left wrist.  Flexion/tension of both elbow and wrist intact by difficulty.  No bony tenderness noted to right upper extremity.  Tenderness palpation of the  proximal femur.  No deformity or crepitus noted.  Flexion/tension of bilateral lower extremities intact any difficulty.  Skin:    General: Skin is warm and dry.  Neurological:     Mental Status: She is alert.     Comments: Cranial nerves III-XII intact Follows commands, Moves all extremities  5/5 strength to BUE and BLE  Patient initially supporting her left arm with her right arm.  She would raise the left arm with the right arm.  She states that this was secondary to pain.  I did have her move her right arm and she was able to lift her right arm but did report pain with doing so.  No weakness noted. Equal grip strength bilaterally.  Sensation intact throughout all major nerve distributions No gait abnormalities  No slurred speech. No facial droop.   Psychiatric:        Speech: Speech normal.        Behavior: Behavior normal.     ED Results / Procedures / Treatments   Labs (all labs ordered are listed, but only abnormal results are displayed) Labs Reviewed - No data to display  EKG None  Radiology CT Head Wo Contrast  Result Date: 07/17/2020 CLINICAL DATA:  Status post fall. EXAM: CT HEAD WITHOUT CONTRAST TECHNIQUE: Contiguous axial images were obtained from the base of the skull through the vertex without intravenous contrast. COMPARISON:  None. FINDINGS: Brain: There is mild cerebral atrophy with widening of the extra-axial spaces and ventricular dilatation. There are areas of decreased attenuation within the white matter tracts of the supratentorial brain, consistent with microvascular disease changes. Vascular: No hyperdense vessel or unexpected calcification. Skull: Normal. Negative for fracture or focal lesion. Sinuses/Orbits: No acute finding. Other: None. IMPRESSION: 1.  Mild cerebral atrophy. 2. No acute intracranial abnormality. Electronically Signed   By: Aram Candela M.D.   On: 07/17/2020 19:53   CT Cervical Spine Wo Contrast  Result Date: 07/17/2020 CLINICAL DATA:  Status post fall. EXAM: CT CERVICAL SPINE WITHOUT CONTRAST TECHNIQUE: Multidetector CT imaging of the cervical spine was performed without intravenous contrast. Multiplanar CT image reconstructions were also generated. COMPARISON:  None. FINDINGS: Alignment: Normal. Skull base and vertebrae: No acute fracture. Chronic changes seen involving the tip of the dens. No primary bone lesion or focal pathologic process. Soft tissues and spinal canal: No prevertebral fluid or swelling. No visible canal hematoma. Disc levels: Mild to moderate severity endplate sclerosis and anterior osteophyte formation is seen at the levels of C2-C3, C5-C6, C6-C7 and C7-T1. Moderate severity intervertebral disc space narrowing is seen at the levels of C6-C7 and C7-T1 with mild intervertebral disc space narrowing noted throughout the remainder of the cervical spine. Bilateral moderate to marked severity multilevel facet joint hypertrophy is noted. Upper chest: Negative. Other: None. IMPRESSION: 1. Moderate severity multilevel degenerative changes, most prominent at the levels of C6-C7 and C7-T1. 2. No evidence of an acute fracture or subluxation. Electronically Signed   By: Aram Candela M.D.   On: 07/17/2020 19:57   DG Shoulder Left  Result Date: 07/17/2020 CLINICAL DATA:  Initial evaluation for acute shoulder pain. EXAM: LEFT SHOULDER - 2+ VIEW COMPARISON:  None. FINDINGS: Cortical irregularity with lucency extending through the distal left clavicle, consistent with an acute nondisplaced clavicular fracture. AC joint remains approximated. No other acute fracture dislocation. No visible soft tissue abnormality. Visualized left hemithorax grossly clear. IMPRESSION: Acute nondisplaced fracture of the distal left clavicle.  Electronically Signed   By: Janell Quiet.D.  On: 07/17/2020 19:49   DG Femur Min 2 Views Left  Result Date: 07/17/2020 CLINICAL DATA:  Initial evaluation for acute pain status post injury. EXAM: LEFT FEMUR 2 VIEWS COMPARISON:  None. FINDINGS: No acute fracture or dislocation. Mild osteoarthritic changes noted about the left hip and knee. Visualized bony pelvis grossly intact. No visible soft tissue abnormality. Scattered degenerative changes noted within the visualized lower lumbar spine. IMPRESSION: 1. No acute osseous abnormality about the left femur. 2. 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 Electronically Signed   By: Rise Mu M.D.   On: 07/17/2020 19:51    Procedures Procedures (including critical care time)  Medications Ordered in ED Medications  acetaminophen (TYLENOL) tablet 650 mg (650 mg Oral Given 07/17/20 1919)    ED Course  I have reviewed the triage vital signs and the nursing notes.  Pertinent labs & imaging results that were available during my care of the patient were reviewed by me and considered in my medical decision making (see chart for details).    MDM Rules/Calculators/A&P                          59 year old female who presents for evaluation of left shoulder, left neck, left headache, left leg pain after a mechanical fall.  She reports that she was pushing her mom on a walker chair and states that the walker chair collapsed causing them both to fall.  Patient does report hitting her head on the cement.  No LOC.  She is on aspirin.  No other blood thinners.  Since then has been having left-sided head pain, left neck pain, left shoulder, left leg pain.  On initial arrival, she is afebrile, nontoxic-appearing.  Vital signs are stable.  On exam, she has no evidence of skull deformity or crepitus noted.  She has diffuse tenderness noted to the paraspinal muscles of the neck.  She also has tenderness in the left shoulder.  She has  pain with range of motion of the shoulder.  She was initially using her right upper extremity to support her left but as I had her move her right upper extremity away, she was able to lift her arm up but did report pain with doing so.  She was able to hold it against gravity.  No weakness exhibited.  Equal grip strength noted bilaterally.  Will obtain x-rays of shoulder and leg.  I discussed with patient regarding fall and that she is complaining of headache as well as pain in her neck.  We discussed at length and opted for further evaluation with imaging.  I feel that this is reasonable given her age and that she fell and hit concrete.  Shoulder x-ray shows acute nondisplaced fracture of the left distal clavicle.  Femur x-ray negative for any acute abnormalities.  CT head shows no acute abnormalities.  CT C-spine shows degenerative changes.  No acute abnormalities.  Discussed results with patient.  We will put her in a sling immobilizer for immobilization.  Patient instructed to follow-up with orthopedics as directed.  Will give patient short course of pain medication for breakthrough severe pain. At this time, patient exhibits no emergent life-threatening condition that require further evaluation in ED. Patient had ample opportunity for questions and discussion. All patient's questions were answered with full understanding. Strict return precautions discussed. Patient expresses understanding and agreement to plan.   Portions of this note were generated with Scientist, clinical (histocompatibility and immunogenetics). Dictation errors  may occur despite best attempts at proofreading.   Final Clinical Impression(s) / ED Diagnoses Final diagnoses:  Fall, initial encounter  Closed nondisplaced fracture of left clavicle, unspecified part of clavicle, initial encounter  Acute pain of left shoulder    Rx / DC Orders ED Discharge Orders         Ordered    methocarbamol (ROBAXIN) 500 MG tablet  2 times daily        07/17/20 2049     oxyCODONE-acetaminophen (PERCOCET/ROXICET) 5-325 MG tablet  Every 8 hours PRN        07/17/20 2049           Rosana Hoes 07/17/20 2121    Pollyann Savoy, MD 07/17/20 2225

## 2020-07-19 ENCOUNTER — Telehealth: Payer: Self-pay | Admitting: Internal Medicine

## 2020-07-19 NOTE — Telephone Encounter (Signed)
Spoke w/ Pt- she is at [facility] now being seen, she wanted to make PCP aware since she is under pain chronic with [place] that ortho may give her something else for pain as oxycodone isn't helping much w/ her pain. I informed I'm sorry that she had an accident and would let PCP know.

## 2020-07-19 NOTE — Telephone Encounter (Signed)
I understand that temporarily she may need additional pain medicine.

## 2020-07-19 NOTE — Telephone Encounter (Signed)
Patient states she had a fall an fracture clavicle. She has enough pain medication at this moment but will need some later on. She would like to speak to CMA

## 2020-07-24 ENCOUNTER — Other Ambulatory Visit: Payer: Self-pay | Admitting: Internal Medicine

## 2020-07-29 ENCOUNTER — Telehealth: Payer: Self-pay | Admitting: Internal Medicine

## 2020-07-29 MED ORDER — OXYCODONE-ACETAMINOPHEN 5-325 MG PO TABS: 1.0000 | ORAL_TABLET | ORAL | 0 refills | Status: DC | PRN

## 2020-07-29 NOTE — Telephone Encounter (Signed)
See telephone 07/19/2020- Pt has broken clavicle.

## 2020-07-29 NOTE — Telephone Encounter (Signed)
Requesting: oxycodone 5-325mg  Contract: 09/13/2018 UDS: 10/05/2019 Last Visit: 05/13/2020 Next Visit: 09/16/2020 Last Refill: 06/26/2020 #60 and 0RF Pt sig: 1 tab q4h prn  Please Advise

## 2020-07-29 NOTE — Telephone Encounter (Signed)
Call patient: I noted another provided send 6 tablets to her, advise that is against contract.  We will send a refill for now

## 2020-07-29 NOTE — Telephone Encounter (Signed)
Medication: oxyCODONE-acetaminophen (PERCOCET/ROXICET) 5-325 MG tablet [[id number]   Has the patient contacted their pharmacy? No. (If no, request that the patient contact the pharmacy for the refill.) (If yes, when and what did the pharmacy advise?)  Preferred Pharmacy (with phone number or street name):  [facility] [address] [place], [place] - [address]  [address] [place] [address]  Phone:  [phone] Fax:  [phone]  DEA #:  --  DAW Reason: --     Agent: Please be advised that RX refills may take up to 3 business days. We ask that you follow-up with your pharmacy.

## 2020-07-29 NOTE — Telephone Encounter (Signed)
Got it, thank you!!

## 2020-08-19 ENCOUNTER — Encounter: Payer: Self-pay | Admitting: Internal Medicine

## 2020-08-21 ENCOUNTER — Other Ambulatory Visit: Payer: Self-pay | Admitting: Internal Medicine

## 2020-08-21 MED ORDER — OXYCODONE-ACETAMINOPHEN 5-325 MG PO TABS: 1.0000 | ORAL_TABLET | Freq: Four times a day (QID) | ORAL | 0 refills | Status: DC | PRN

## 2020-09-16 ENCOUNTER — Ambulatory Visit (INDEPENDENT_AMBULATORY_CARE_PROVIDER_SITE_OTHER): Payer: 59 | Admitting: Internal Medicine

## 2020-09-16 ENCOUNTER — Other Ambulatory Visit: Payer: Self-pay

## 2020-09-16 ENCOUNTER — Encounter: Payer: Self-pay | Admitting: Internal Medicine

## 2020-09-16 VITALS — BP 115/77 | HR 77 | Temp 98.3°F | Resp 18 | Ht 67.0 in | Wt 186.5 lb

## 2020-09-16 DIAGNOSIS — Z79899 Other long term (current) drug therapy: Secondary | ICD-10-CM

## 2020-09-16 DIAGNOSIS — M542 Cervicalgia: Secondary | ICD-10-CM

## 2020-09-16 DIAGNOSIS — E119 Type 2 diabetes mellitus without complications: Secondary | ICD-10-CM | POA: Diagnosis not present

## 2020-09-16 DIAGNOSIS — F32A Depression, unspecified: Secondary | ICD-10-CM

## 2020-09-16 DIAGNOSIS — E7800 Pure hypercholesterolemia, unspecified: Secondary | ICD-10-CM | POA: Diagnosis not present

## 2020-09-16 DIAGNOSIS — F419 Anxiety disorder, unspecified: Secondary | ICD-10-CM | POA: Diagnosis not present

## 2020-09-16 DIAGNOSIS — Z1231 Encounter for screening mammogram for malignant neoplasm of breast: Secondary | ICD-10-CM

## 2020-09-16 DIAGNOSIS — I10 Essential (primary) hypertension: Secondary | ICD-10-CM | POA: Diagnosis not present

## 2020-09-16 LAB — COMPREHENSIVE METABOLIC PANEL
ALT: 13 U/L (ref 0–35)
AST: 19 U/L (ref 0–37)
Albumin: 4.2 g/dL (ref 3.5–5.2)
Alkaline Phosphatase: 71 U/L (ref 39–117)
BUN: 19 mg/dL (ref 6–23)
CO2: 27 mEq/L (ref 19–32)
Calcium: 9.8 mg/dL (ref 8.4–10.5)
Chloride: 101 mEq/L (ref 96–112)
Creatinine, Ser: 0.93 mg/dL (ref 0.40–1.20)
GFR: 66.85 mL/min (ref >60.00)
Glucose, Bld: 159 mg/dL — ABNORMAL HIGH (ref 70–99)
Potassium: 4.3 mEq/L (ref 3.5–5.1)
Sodium: 136 mEq/L (ref 135–145)
Total Bilirubin: 0.5 mg/dL (ref 0.2–1.2)
Total Protein: 8 g/dL (ref 6.0–8.3)

## 2020-09-16 LAB — HEMOGLOBIN A1C: Hgb A1c MFr Bld: 6.7 % — ABNORMAL HIGH (ref 4.6–6.5)

## 2020-09-16 MED ORDER — TRAZODONE HCL 50 MG PO TABS: ORAL_TABLET | ORAL | 1 refills | Status: DC

## 2020-09-16 NOTE — Progress Notes (Signed)
Pre visit review using our clinic review tool, if applicable. No additional management support is needed unless otherwise documented below in the visit note. 

## 2020-09-16 NOTE — Patient Instructions (Addendum)
Per our records you are due for an eye exam. Please contact your eye doctor to schedule an appointment. Please have them send copies of your office visit notes to [place]. Our fax number is [phone].  Starting today, decrease escitalopram 20 mg to half tablet daily In 10 days stop escitalopram  Starting today: Take trazodone 50 mg 1 tablet at bedtime In 10 days, increase to 2 tablets at bedtime.  Please call if you have issues or problems with switching medications   GO TO THE LAB : Get the blood work     GO TO THE FRONT DESK, PLEASE SCHEDULE YOUR APPOINTMENTS Come back for   a checkup in 6 weeks

## 2020-09-16 NOTE — Progress Notes (Signed)
Subjective:    Patient ID: Alexandra Parsons, female    DOB: 1961-02-07, 60 y.o.   MRN: [id number]  DOS:  09/16/2020 Type of visit - description: Follow-up  Since the last office visit, had a clavicle fracture.  Fortunately she is now recuperating and will go back to work tomorrow. Depression: Worsening gradually in the last several months. Has a number of personal and family issues. Denies any suicidal ideas. Switch to trazodone?.  DM: Last A1c very good, good med compliance, no ambulatory CBGs.  Review of Systems See above   Past Medical History:  Diagnosis Date  . Abnormal Pap smear of cervix    h/o HPV  . Chronic sinusitis, chronic cough (?COPD) 07/16/2009  . Depression   . Diabetes mellitus    type 2  . Dyslipidemia   . Elevated LFTs 11/19/2011  . Hypertension   . Leukocytosis 11/19/2011  . Menopausal syndrome   . Neck pain 05/20/2011  . Sarcoid    post partum in remission  . Submandibular gland swelling    Right    Past Surgical History:  Procedure Laterality Date  . abdominal plasty remotely    . NASAL SEPTUM SURGERY  12/26/2015  . pilondial cyst removal    . TUBAL LIGATION    . TURBINATE REDUCTION Bilateral 12/26/2015    Allergies as of 09/16/2020      Reactions   Erythromycin    REACTION: palpitations; ZPACK is ok   Fluoxetine Hcl    REACTION: hallucinations   Venlafaxine    REACTION: made her feel bad      Medication List       Accurate as of September 16, 2020 11:59 PM. If you have any questions, ask your nurse or doctor.        STOP taking these medications   escitalopram 20 MG tablet Commonly known as: LEXAPRO Stopped by: Willow Ora, MD     TAKE these medications   aspirin EC 81 MG tablet Take 81 mg by mouth daily.   bisoprolol-hydrochlorothiazide 2.5-6.25 MG tablet Commonly known as: ZIAC Take 1 tablet by mouth daily.   docusate sodium 100 MG capsule Commonly known as: COLACE Take 100 mg by mouth daily as needed for moderate  constipation.   ezetimibe 10 MG tablet Commonly known as: ZETIA Take 1 tablet (10 mg total) by mouth daily.   metFORMIN 1000 MG tablet Commonly known as: GLUCOPHAGE Take 1 tablet (1,000 mg total) by mouth 2 (two) times daily with a meal.   oxyCODONE-acetaminophen 5-325 MG tablet Commonly known as: PERCOCET/ROXICET Take 1 tablet by mouth every 6 (six) hours as needed.   pioglitazone 30 MG tablet Commonly known as: ACTOS Take 1 tablet (30 mg total) by mouth daily.   sitaGLIPtin 100 MG tablet Commonly known as: Januvia Take 1 tablet (100 mg total) by mouth daily.   traZODone 50 MG tablet Commonly known as: DESYREL 1 tablet daily at bedtime, after 10 days, take 2 tablets at bedtime Started by: Willow Ora, MD          Objective:   Physical Exam BP 115/77 (BP Location: Right Arm, Patient Position: Sitting, Cuff Size: Normal)   Pulse 77   Temp 98.3 F (36.8 C) (Oral)   Resp 18   Ht 5\' 7"  (1.702 m)   Wt 186 lb 8 oz (84.6 kg)   SpO2 98%   BMI 29.21 kg/m  General:   Well developed, NAD, BMI noted. HEENT:  Normocephalic . Face symmetric, atraumatic  Lungs:  CTA B Normal respiratory effort, no intercostal retractions, no accessory muscle use. Heart: RRR,  no murmur.  DM foot exam: No edema, good pulses, pinprick examination normal Skin: Not pale. Not jaundice Neurologic:  alert & oriented X3.  Speech normal, gait appropriate for age and unassisted Psych--  Cognition and judgment appear intact.  Cooperative with normal attention span and concentration.  Behavior appropriate. No anxious or depressed appearing.      Assessment     Assessment DM HTN Dyslipidemia: intolerant to zocor ~ 2014 and  Lipitor 04/2018 Vit d def   CT 09-2018 ---COPD ---Pulmonary nodules, declined further CTs on 05-2019 --- (+)  Coronary calcium   Smoker ~1  ppd Psych: --Depression --ADHD MSK: Neck pain, chronic, on pain meds prn, UDS 09-2013 risk; intolerant to vicodin, oxycodone  ok GYN: --BTL, Menopausal , h/o  Abnormal Pap smear, HPV Elevated LFTs: Hepatitis serology (-) 2008 H/o Sarcoid postpartum in remission H/o Chronic cough: Chronic sinusitis? COPD? H/o Leukocytosis  H/o +  PPD   PLAN DM: Last A1c 6.4, currently on Metformin, Actos,Januvia.  Feet exam normal, check A1c. HTN: BP today is very good, continue Ziac, check a CMP. High cholesterol: Last LDL 100, only on Zetia.  Reassess on RTC, consider low-dose Crestor Depression: Currently on Lexapro 20 mg, gradually getting worse, has episodes when she feels very sad and down has some difficulty sleeping.  Denies euphoria.  No suicidal ideas, PHQ-9 today : 14 In the past she took trazodone with great success and would like to try it again. Plan: Gradually transition from Lexapro to trazodone, see AVS, RTC 6 weeks.   Chronic neck pain: On oxycodone, UDS and contract today Preventive care: Offered Shingrix booster, declined it, schedule mammogram RTC 6 weeks    This visit occurred during the SARS-CoV-2 public health emergency.  Safety protocols were in place, including screening questions prior to the visit, additional usage of staff PPE, and extensive cleaning of exam room while observing appropriate contact time as indicated for disinfecting solutions.

## 2020-09-17 NOTE — Assessment & Plan Note (Signed)
DM: Last A1c 6.4, currently on Metformin, Actos,Januvia.  Feet exam normal, check A1c. HTN: BP today is very good, continue Ziac, check a CMP. High cholesterol: Last LDL 100, only on Zetia.  Reassess on RTC, consider low-dose Crestor Depression: Currently on Lexapro 20 mg, gradually getting worse, has episodes when she feels very sad and down has some difficulty sleeping.  Denies euphoria.  No suicidal ideas, PHQ-9 today : 14 In the past she took trazodone with great success and would like to try it again. Plan: Gradually transition from Lexapro to trazodone, see AVS, RTC 6 weeks.   Chronic neck pain: On oxycodone, UDS and contract today Preventive care: Offered Shingrix booster, declined it, schedule mammogram RTC 6 weeks

## 2020-09-18 LAB — DRUG MONITORING, PANEL 8 WITH CONFIRMATION, URINE
6 Acetylmorphine: NEGATIVE ng/mL (ref <10)
Alcohol Metabolites: NEGATIVE ng/mL
Amphetamines: NEGATIVE ng/mL (ref <500)
Benzodiazepines: NEGATIVE ng/mL (ref <100)
Buprenorphine, Urine: NEGATIVE ng/mL (ref <5)
Cocaine Metabolite: NEGATIVE ng/mL (ref <150)
Codeine: NEGATIVE ng/mL (ref <50)
Creatinine: >300 mg/dL
Hydrocodone: NEGATIVE ng/mL (ref <50)
Hydromorphone: NEGATIVE ng/mL (ref <50)
MDMA: NEGATIVE ng/mL (ref <500)
Marijuana Metabolite: NEGATIVE ng/mL (ref <20)
Morphine: NEGATIVE ng/mL (ref <50)
Norhydrocodone: NEGATIVE ng/mL (ref <50)
Noroxycodone: >10000 ng/mL — ABNORMAL HIGH (ref <50)
Opiates: NEGATIVE ng/mL (ref <100)
Oxidant: NEGATIVE ug/mL
Oxycodone: 3640 ng/mL — ABNORMAL HIGH (ref <50)
Oxycodone: POSITIVE ng/mL — AB (ref <100)
Oxymorphone: 460 ng/mL — ABNORMAL HIGH (ref <50)
pH: 5.5 (ref 4.5–9.0)

## 2020-09-18 LAB — DM TEMPLATE

## 2020-10-07 ENCOUNTER — Telehealth: Payer: Self-pay | Admitting: Internal Medicine

## 2020-10-07 NOTE — Telephone Encounter (Signed)
Medication:  oxyCODONE-acetaminophen (PERCOCET/ROXICET) 5-325 MG tablet [[id number]]      Has the patient contacted their pharmacy?  (If no, request that the patient contact the pharmacy for the refill.) (If yes, when and what did the pharmacy advise?)     Preferred Pharmacy (with phone number or street name): [facility] [address] [place], [place] - [address]  [address] [place] [address]  Phone:  [phone] Fax:  [phone]      Agent: Please be advised that RX refills may take up to 3 business days. We ask that you follow-up with your pharmacy.

## 2020-10-07 NOTE — Telephone Encounter (Signed)
Requesting: oxycodone 5-325mg  Contract: 09/16/2020 UDS: 09/16/2020 Last Visit: 09/16/2020 Next Visit: 10/28/2020 Last Refill: 08/21/2020 #120 and 0RF Pt sig: 1 tab q6h prn  Please Advise

## 2020-10-08 MED ORDER — OXYCODONE-ACETAMINOPHEN 5-325 MG PO TABS: 1.0000 | ORAL_TABLET | Freq: Four times a day (QID) | ORAL | 0 refills | Status: DC | PRN

## 2020-10-08 NOTE — Telephone Encounter (Signed)
PDMP reviewed, prescription sent 

## 2020-10-28 ENCOUNTER — Telehealth (INDEPENDENT_AMBULATORY_CARE_PROVIDER_SITE_OTHER): Payer: 59 | Admitting: Internal Medicine

## 2020-10-28 ENCOUNTER — Encounter: Payer: Self-pay | Admitting: Internal Medicine

## 2020-10-28 ENCOUNTER — Other Ambulatory Visit: Payer: Self-pay

## 2020-10-28 DIAGNOSIS — G4700 Insomnia, unspecified: Secondary | ICD-10-CM | POA: Diagnosis not present

## 2020-10-28 DIAGNOSIS — F32A Depression, unspecified: Secondary | ICD-10-CM

## 2020-10-28 DIAGNOSIS — F419 Anxiety disorder, unspecified: Secondary | ICD-10-CM

## 2020-10-28 DIAGNOSIS — I10 Essential (primary) hypertension: Secondary | ICD-10-CM

## 2020-10-28 MED ORDER — TRAZODONE HCL 50 MG PO TABS
25.0000 mg | ORAL_TABLET | Freq: Every evening | ORAL | 1 refills | Status: DC | PRN
Start: 2020-10-28 — End: 2021-01-07

## 2020-10-28 MED ORDER — ESCITALOPRAM OXALATE 20 MG PO TABS: 20.0000 mg | ORAL_TABLET | Freq: Every day | ORAL | 1 refills | Status: DC

## 2020-10-28 NOTE — Progress Notes (Signed)
Subjective:    Patient ID: Alexandra Parsons, female    DOB: 11-Dec-1960, 60 y.o.   MRN: [id number]  DOS:  10/28/2020 Type of visit - description:  Virtual Visit via Video Note  I connected with the above patient  by a video enabled telemedicine application and verified that I am speaking with the correct person using two identifiers.   THIS ENCOUNTER IS A VIRTUAL VISIT DUE TO COVID-19 - PATIENT WAS NOT SEEN IN THE OFFICE. PATIENT HAS CONSENTED TO VIRTUAL VISIT / TELEMEDICINE VISIT   Location of patient: home  Location of provider: office  Persons participating in the virtual visit: patient, provider   I discussed the limitations of evaluation and management by telemedicine and the availability of in person appointments. The patient expressed understanding and agreed to proceed.   Follow-up Since the last office visit, was switched from Lexapro to trazodone at patient request. Sleeping well however anxiety has returned. She self restarted Lexapro at 10 mg dose.  HTN: Very well controlled when checked at home.   Review of Systems See above   Past Medical History:  Diagnosis Date  . Abnormal Pap smear of cervix    h/o HPV  . Chronic sinusitis, chronic cough (?COPD) 07/16/2009  . Depression   . Diabetes mellitus    type 2  . Dyslipidemia   . Elevated LFTs 11/19/2011  . Hypertension   . Leukocytosis 11/19/2011  . Menopausal syndrome   . Neck pain 05/20/2011  . Sarcoid    post partum in remission  . Submandibular gland swelling    Right    Past Surgical History:  Procedure Laterality Date  . abdominal plasty remotely    . NASAL SEPTUM SURGERY  12/26/2015  . pilondial cyst removal    . TUBAL LIGATION    . TURBINATE REDUCTION Bilateral 12/26/2015    Allergies as of 10/28/2020      Reactions   Erythromycin    REACTION: palpitations; ZPACK is ok   Fluoxetine Hcl    REACTION: hallucinations   Venlafaxine    REACTION: made her feel bad      Medication List        Accurate as of October 28, 2020  9:33 PM. If you have any questions, ask your nurse or doctor.        aspirin EC 81 MG tablet Take 81 mg by mouth daily.   bisoprolol-hydrochlorothiazide 2.5-6.25 MG tablet Commonly known as: ZIAC Take 1 tablet by mouth daily.   docusate sodium 100 MG capsule Commonly known as: COLACE Take 100 mg by mouth daily as needed for moderate constipation.   escitalopram 20 MG tablet Commonly known as: LEXAPRO Take 1 tablet (20 mg total) by mouth daily. Started by: Willow Ora, MD   ezetimibe 10 MG tablet Commonly known as: ZETIA Take 1 tablet (10 mg total) by mouth daily.   metFORMIN 1000 MG tablet Commonly known as: GLUCOPHAGE Take 1 tablet (1,000 mg total) by mouth 2 (two) times daily with a meal.   oxyCODONE-acetaminophen 5-325 MG tablet Commonly known as: PERCOCET/ROXICET Take 1 tablet by mouth every 6 (six) hours as needed.   pioglitazone 30 MG tablet Commonly known as: ACTOS Take 1 tablet (30 mg total) by mouth daily.   sitaGLIPtin 100 MG tablet Commonly known as: Januvia Take 1 tablet (100 mg total) by mouth daily.   traZODone 50 MG tablet Commonly known as: DESYREL Take 0.5-1 tablets (25-50 mg total) by mouth at bedtime as needed for sleep.  What changed:   how much to take  how to take this  when to take this  reasons to take this  additional instructions Changed by: Willow Ora, MD          Objective:   Physical Exam There were no vitals taken for this visit. This is a virtual video visit, alert oriented x3, in no distress.  No BP measured today but few days ago was normal.    Assessment     Assessment DM HTN Dyslipidemia: intolerant to zocor ~ 2014 and  Lipitor 04/2018 Vit d def   CT 09-2018 ---COPD ---Pulmonary nodules, declined further CTs on 05-2019 --- (+)  Coronary calcium   Smoker ~1  ppd Psych: --Depression anxiety --ADHD MSK: Neck pain, chronic, on pain meds prn, UDS 09-2013 risk; intolerant to vicodin,  oxycodone ok GYN: --BTL, Menopausal , h/o  Abnormal Pap smear, HPV Elevated LFTs: Hepatitis serology (-) 2008 H/o Sarcoid postpartum in remission H/o Chronic cough: Chronic sinusitis? COPD? H/o Leukocytosis  H/o +  PPD   PLAN HTN: Good control with Ziac, last CMP satisfactory, reports normal BPs at home. Depression, anxiety: See last visit, Lexapro was switched to trazodone. She is sleeping well however she is now anxious and self restarted Lexapro 10 mg. Strongly request to go back to the original Lexapro 20 mg dose and continue trazodone. 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 Plan: We agreed to go back on the original Lexapro 20 mg, minimize the use of trazodone to help with sleep (25 to 50 mg) pay close attention to serotonin syndrome symptoms.  Message with instructions sent.  Declined with zolpidem.  RTC 4 months      I discussed the assessment and treatment plan with the patient. The patient was provided an opportunity to ask questions and all were answered. The patient agreed with the plan and demonstrated an understanding of the instructions.   The patient was advised to call back or seek an in-person evaluation if the symptoms worsen or if the condition fails to improve as anticipated.

## 2020-10-28 NOTE — Patient Instructions (Signed)
Serotonin syndrome

## 2020-10-28 NOTE — Assessment & Plan Note (Signed)
HTN: Good control with Ziac, last CMP satisfactory, reports normal BPs at home. Depression, anxiety: See last visit, Lexapro was switched to trazodone. She is sleeping well however she is now anxious and self restarted Lexapro 10 mg. Strongly request to go back to the original Lexapro 20 mg dose and continue trazodone. 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 Plan: We agreed to go back on the original Lexapro 20 mg, minimize the use of trazodone to help with sleep (25 to 50 mg) pay close attention to serotonin syndrome symptoms.  Message with instructions sent.  Declined with zolpidem.  RTC 4 months

## 2020-11-05 ENCOUNTER — Encounter: Payer: Self-pay | Admitting: Internal Medicine

## 2020-11-25 ENCOUNTER — Telehealth: Payer: Self-pay

## 2020-11-25 MED ORDER — OXYCODONE-ACETAMINOPHEN 5-325 MG PO TABS: 1.0000 | ORAL_TABLET | Freq: Four times a day (QID) | ORAL | 0 refills | Status: DC | PRN

## 2020-11-25 NOTE — Telephone Encounter (Signed)
PDMP okay, Rx sent 

## 2020-11-25 NOTE — Telephone Encounter (Signed)
Requesting: oxycodone 5-325mg  Contract: 09/16/2020 UDS: 09/16/2020 Last Visit: 10/28/2020 Next Visit: None Last Refill: 10/08/2020 #120 and 0RF  Please Advise

## 2020-11-25 NOTE — Telephone Encounter (Signed)
Pt called requesting refill on oxycodone.  Please send refill to [facility] on [address].

## 2020-12-04 ENCOUNTER — Ambulatory Visit: Payer: 59 | Attending: Internal Medicine

## 2020-12-04 DIAGNOSIS — Z20822 Contact with and (suspected) exposure to covid-19: Secondary | ICD-10-CM

## 2020-12-05 LAB — NOVEL CORONAVIRUS, NAA: SARS-CoV-2, NAA: DETECTED — AB

## 2020-12-05 LAB — SARS-COV-2, NAA 2 DAY TAT

## 2021-01-07 ENCOUNTER — Ambulatory Visit (INDEPENDENT_AMBULATORY_CARE_PROVIDER_SITE_OTHER): Payer: 59 | Admitting: Internal Medicine

## 2021-01-07 ENCOUNTER — Other Ambulatory Visit: Payer: Self-pay

## 2021-01-07 ENCOUNTER — Encounter: Payer: Self-pay | Admitting: Internal Medicine

## 2021-01-07 VITALS — BP 129/83 | HR 74 | Temp 97.3°F | Ht 67.0 in | Wt 187.8 lb

## 2021-01-07 DIAGNOSIS — M797 Fibromyalgia: Secondary | ICD-10-CM

## 2021-01-07 DIAGNOSIS — I10 Essential (primary) hypertension: Secondary | ICD-10-CM | POA: Diagnosis not present

## 2021-01-07 DIAGNOSIS — F419 Anxiety disorder, unspecified: Secondary | ICD-10-CM | POA: Diagnosis not present

## 2021-01-07 DIAGNOSIS — G8929 Other chronic pain: Secondary | ICD-10-CM

## 2021-01-07 DIAGNOSIS — E119 Type 2 diabetes mellitus without complications: Secondary | ICD-10-CM | POA: Diagnosis not present

## 2021-01-07 DIAGNOSIS — F32A Depression, unspecified: Secondary | ICD-10-CM

## 2021-01-07 LAB — BASIC METABOLIC PANEL
BUN: 19 mg/dL (ref 6–23)
CO2: 28 mEq/L (ref 19–32)
Calcium: 9.7 mg/dL (ref 8.4–10.5)
Chloride: 103 mEq/L (ref 96–112)
Creatinine, Ser: 0.84 mg/dL (ref 0.40–1.20)
GFR: 75.37 mL/min (ref >60.00)
Glucose, Bld: 170 mg/dL — ABNORMAL HIGH (ref 70–99)
Potassium: 4.6 mEq/L (ref 3.5–5.1)
Sodium: 139 mEq/L (ref 135–145)

## 2021-01-07 LAB — HEMOGLOBIN A1C: Hgb A1c MFr Bld: 6.2 % (ref 4.6–6.5)

## 2021-01-07 MED ORDER — TRAZODONE HCL 100 MG PO TABS: 100.0000 mg | ORAL_TABLET | Freq: Every evening | ORAL | 0 refills | Status: DC | PRN

## 2021-01-07 MED ORDER — OXYCODONE-ACETAMINOPHEN 5-325 MG PO TABS: 1.0000 | ORAL_TABLET | Freq: Four times a day (QID) | ORAL | 0 refills | Status: DC | PRN

## 2021-01-07 NOTE — Assessment & Plan Note (Signed)
DM: On metformin, Actos, Januvia.  Check A1c HTN: On Ziac, check a BMP Fibromyalgia: The patient has anxiety, depression, fatigue, chronic neck pain, chronic multiple myalgias, + FH for fibromyalgia. I suspect she has this condition. Previously RF, ANA and sed rate were negative. Plan: Refer to rheumatology for confirmation of diagnosis. Depression, anxiety: PHQ-9 is elevated at 18, no suicidal ideas.  We had a long conversation about the issue, trazodone helps great. Plan: Increase trazodone 100 mg nightly, long-term plan  Is to decrease Lexapro to 10 mg if possible.  Again we had a long discussion about serotonin syndrome, she does keep an eye on red flag sxs. Tobacco abuse: Counseled. Chronic neck pain: RF meds, PDMP okay Preventive care: Specifically declined  CT chest. She is also due for colonoscopy, COVID-vaccine #3, second Shingrix, Pap smear, mammogram.  Encouraged him to start tackling dose issues RTC 4 months

## 2021-01-07 NOTE — Progress Notes (Signed)
Subjective:    Patient ID: Alexandra Parsons, female    DOB: 1961-08-02, 60 y.o.   MRN: [id number]  DOS:  01/07/2021 Type of visit - description: ROV Discussed again multiple issues Depression not well controlled, she feels down frequently, denies suicidal ideas or feeling hopeless. Would like to increase trazodone dose.  Continue feeling exhausted all the time, hurting on every joint, continue with chronic neck pain. Taking oxycodone either 2 or 3 times a day. Still smoking  Review of Systems Denies fever chills No weight loss Mild chronic headaches with no amaurosis fugax  Past Medical History:  Diagnosis Date  . Abnormal Pap smear of cervix    h/o HPV  . Chronic sinusitis, chronic cough (?COPD) 07/16/2009  . Depression   . Diabetes mellitus    type 2  . Dyslipidemia   . Elevated LFTs 11/19/2011  . Hypertension   . Leukocytosis 11/19/2011  . Menopausal syndrome   . Neck pain 05/20/2011  . Sarcoid    post partum in remission  . Submandibular gland swelling    Right    Past Surgical History:  Procedure Laterality Date  . abdominal plasty remotely    . NASAL SEPTUM SURGERY  12/26/2015  . pilondial cyst removal    . TUBAL LIGATION    . TURBINATE REDUCTION Bilateral 12/26/2015    Allergies as of 01/07/2021      Reactions   Erythromycin    REACTION: palpitations; ZPACK is ok   Fluoxetine Hcl    REACTION: hallucinations   Venlafaxine    REACTION: made her feel bad      Medication List       Accurate as of Jan 07, 2021  8:51 PM. If you have any questions, ask your nurse or doctor.        aspirin EC 81 MG tablet Take 81 mg by mouth daily.   bisoprolol-hydrochlorothiazide 2.5-6.25 MG tablet Commonly known as: ZIAC Take 1 tablet by mouth daily.   docusate sodium 100 MG capsule Commonly known as: COLACE Take 100 mg by mouth daily as needed for moderate constipation.   escitalopram 20 MG tablet Commonly known as: LEXAPRO Take 1 tablet (20 mg total) by mouth  daily.   ezetimibe 10 MG tablet Commonly known as: ZETIA Take 1 tablet (10 mg total) by mouth daily.   metFORMIN 1000 MG tablet Commonly known as: GLUCOPHAGE Take 1 tablet (1,000 mg total) by mouth 2 (two) times daily with a meal.   oxyCODONE-acetaminophen 5-325 MG tablet Commonly known as: PERCOCET/ROXICET Take 1 tablet by mouth every 6 (six) hours as needed.   pioglitazone 30 MG tablet Commonly known as: ACTOS Take 1 tablet (30 mg total) by mouth daily.   sitaGLIPtin 100 MG tablet Commonly known as: Januvia Take 1 tablet (100 mg total) by mouth daily.   traZODone 100 MG tablet Commonly known as: DESYREL Take 1 tablet (100 mg total) by mouth at bedtime as needed for sleep. What changed:   medication strength  how much to take Changed by: Willow Ora, MD          Objective:   Physical Exam BP 129/83 (BP Location: Right Arm, Patient Position: Sitting, Cuff Size: Large)   Pulse 74   Temp (!) 97.3 F (36.3 C) (Temporal)   Ht 5\' 7"  (1.702 m)   Wt 187 lb 12.8 oz (85.2 kg)   SpO2 97%   BMI 29.41 kg/m  General:   Well developed, NAD, BMI noted. HEENT:  Normocephalic .  Face symmetric, atraumatic Lungs:  CTA B Normal respiratory effort, no intercostal retractions, no accessory muscle use. Heart: RRR,  no murmur.  Lower extremities: no pretibial edema bilaterally MSK: Hands with no synovitis Skin: Not pale. Not jaundice Neurologic:  alert & oriented X3.  Speech normal, gait appropriate for age and unassisted Psych--  Cognition and judgment appear intact.  Cooperative with normal attention span and concentration.  Behavior appropriate. No anxious or depressed appearing.      Assessment    Assessment DM HTN Dyslipidemia: intolerant to zocor ~ 2014 and  Lipitor 04/2018 Vit d def   CT 09-2018 ---COPD ---Pulmonary nodules, declined further CTs on 05-2019 --- (+)  Coronary calcium   Smoker ~1  ppd Psych: --Depression anxiety --ADHD MSK: Neck pain,  chronic, on pain meds prn, UDS 09-2013 risk; intolerant to vicodin, oxycodone ok GYN: --BTL, Menopausal , h/o  Abnormal Pap smear, HPV Elevated LFTs: Hepatitis serology (-) 2008 H/o Sarcoid postpartum in remission H/o Chronic cough: Chronic sinusitis? COPD? H/o Leukocytosis  H/o +  PPD   PLAN DM: On metformin, Actos, Januvia.  Check A1c HTN: On Ziac, check a BMP Fibromyalgia: The patient has anxiety, depression, fatigue, chronic neck pain, chronic multiple myalgias, + FH for fibromyalgia. I suspect she has this condition. Previously RF, ANA and sed rate were negative. Plan: Refer to rheumatology for confirmation of diagnosis. Depression, anxiety: PHQ-9 is elevated at 18, no suicidal ideas.  We had a long conversation about the issue, trazodone helps great. Plan: Increase trazodone 100 mg nightly, long-term plan  Is to decrease Lexapro to 10 mg if possible.  Again we had a long discussion about serotonin syndrome, she does keep an eye on red flag sxs. Tobacco abuse: Counseled. Chronic neck pain: RF meds, PDMP okay Preventive care: Specifically declined  CT chest. She is also due for colonoscopy, COVID-vaccine #3, second Shingrix, Pap smear, mammogram.  Encouraged him to start tackling dose issues RTC 4 months  Today I spent 42 minutes with the patient, we had extensive discussion normal depression, anxiety, treatment options. She is also due for a number preventive care interventions and she is encouraged to proceed. We also had a long conversation about fibromyalgia uncompliant I suspect she has it.  This visit occurred during the SARS-CoV-2 public health emergency.  Safety protocols were in place, including screening questions prior to the visit, additional usage of staff PPE, and extensive cleaning of exam room while observing appropriate contact time as indicated for disinfecting solutions.

## 2021-01-07 NOTE — Patient Instructions (Addendum)
Okay to increase trazodone to 100 tablet every night.  Once it kicks in, okay to reduce Lexapro to 10 mg only.  Watch for symptoms of serotonin syndrome, see information below  You may visit the American heart Association website to help with quitting tobacco.  Pending things I am recommending:  Repeat a CT chest Proceed with a colonoscopy Pap smear, mammogram A COVID-vaccine booster Your second Shingrix dose Diabetes eye exam    GO TO THE LAB : Get the blood work     GO TO THE FRONT DESK, PLEASE SCHEDULE YOUR APPOINTMENTS Come back for a physical exam in 4 months    Patient education: Serotonin syndrome (The Basics)    What is serotonin syndrome? Serotonin syndrome is a problem that can happen after taking certain medicines. It is uncommon but can be serious or even deadly if it does happen.  Serotonin syndrome causes symptoms such as:  ?Feeling anxious, restless, or confused  ?Sweating  ?Muscle spasms or muscles that cannot relax normally  ?Very fast back-and-forth eye movements  ?Shaking or trembling  ?Fever  ?A fast heartbeat  ?Vomiting  ?Diarrhea  What causes serotonin syndrome? Serotonin syndrome can happen to people after they take certain medicines or combinations of medicines. It can also happen with certain herbal products and street drugs. There are many medicines and drugs that can lead to serotonin syndrome. In general, they increase a chemical in the brain and body called "serotonin." Serotonin syndrome happens when the levels of serotonin in your brain get too high.  Examples of the medicines and drugs that can cause serotonin syndrome include:  ?Some medicines used to treat depression, such as selective serotonin reuptake inhibitors ("SSRIs")  ?Some medicines used to treat Parkinson disease, such as selegiline (sample brand names: Eldepryl, Emsam) and rasagiline (brand name: Azilect)  ?Some pain relievers, such as meperidine (sample brand  name: Demerol), tramadol (sample brand name: Ultram), fentanyl, and cyclobenzaprine (sample brand name: Flexeril)  ?Saint John's wort (an herbal medicine sometimes used for depression)  ?Medicines used to treat migraine headaches, called "triptans"  ?Some medicines used to treat attention deficit hyperactivity disorder (ADHD)  ?An antibiotic called linezolid (brand name: Zyvox)  ?A cough medicine called dextromethorphan (sample brand names: Delsym, Robitussin DM)  ?Street drugs, such as ecstasy, cocaine, and amphetamines  Doctors do not know why some people get serotonin syndrome and others do not. But they do know that people usually get it within hours of taking a new medicine or drug, a higher dose, or a new combination of medicines or drugs.  Should I see a doctor or nurse? Yes. If you have symptoms of serotonin syndrome, and especially if you recently took a new drug or medicine or a new dose, call your doctor right away. Without proper treatment, severe serotonin syndrome can be deadly.  Can serotonin syndrome be serious? Yes. Severe symptoms include seizures, high fever, sudden changes in blood pressure or heart rate, and passing out. If you or someone you know has severe symptoms, go to the emergency room or call for an ambulance (in the [place] and [place], dial 9-1-1).  Will I need tests? Maybe. There is no one test that can show whether or not you have serotonin syndrome. Still, some of the things the doctor will do during the exam can help him or her figure out if you have it. For instance, the doctor might test your leg muscles to see if they spasm. The doctor will also ask questions about  any medicines, herbal products, or street drugs you might have taken.  If you have serotonin syndrome, it's very important to be honest with your doctor about what you took, how much, and when. This way they will know how to properly treat you.  How is serotonin syndrome treated? As part of  treatment, the doctor will stop the medicines or drugs that caused the serotonin syndrome in the first place. They will also monitor your breathing, heart rate, blood pressure, and body temperature, and try to keep these as close to normal as possible.  Depending on what you need, they might also:  ?Give you medicines to calm you  ?Give you medicines to block the effects of serotonin  ?Work with you to decide whether you should keep taking the medicines that caused your serotonin syndrome  Can serotonin syndrome be prevented? Not always. But there are things you can do to lower the risk.  Doctors have no way to predict who will get serotonin syndrome, so it's not possible to prevent cases caused by properly prescribed medicines. Even so, there are things you can do to protect yourself from dangerous reactions to medicines:  ?Always tell any doctor who prescribes medicines for you about all the medicines, herbal products, and street drugs you take. That way, the doctor can be careful not to give you medicines that could cause problems when combined.  ?If you are already taking any medicines, check with your doctor, nurse, or pharmacist before taking anything else. This includes over-the-counter medicines, supplements, or herbs.  ?When starting a new medicine, have the pharmacist check for drug interactions.  ?Have your doctor, nurse, or pharmacist regularly review your medications list with you to be sure it's correct. They can also make sure you are taking the correct amounts and not taking any medicine you were supposed to stop.  More on this topic  Patient education: Medicines for depression (The Basics) Patient education: Depression (The Basics)  Patient education: Depression in adults (Beyond the Basics) Patient education: Depression treatment options for adults (Beyond the Basics)  All topics are updated as new evidence becomes available and our peer review process is  complete.

## 2021-01-08 NOTE — Addendum Note (Signed)
Addended byConrad Clearmont D on: 01/08/2021 07:58 AM   Modules accepted: Orders

## 2021-01-09 ENCOUNTER — Encounter: Payer: Self-pay | Admitting: Internal Medicine

## 2021-01-16 ENCOUNTER — Telehealth: Payer: Self-pay

## 2021-01-16 NOTE — Telephone Encounter (Signed)
Pt was recently referred to rheumatology for fibromyalgia/chronic pain. 2 local offices have denied referral as they don't treat fibromyalgia. Per PCP- let Pt know if she is okay with pain management referral okay to place.

## 2021-02-15 ENCOUNTER — Other Ambulatory Visit: Payer: Self-pay | Admitting: Internal Medicine

## 2021-02-17 ENCOUNTER — Telehealth: Payer: Self-pay | Admitting: Internal Medicine

## 2021-02-17 MED ORDER — OXYCODONE-ACETAMINOPHEN 5-325 MG PO TABS: 1.0000 | ORAL_TABLET | Freq: Four times a day (QID) | ORAL | 0 refills | Status: DC | PRN

## 2021-02-17 NOTE — Telephone Encounter (Signed)
Requesting: oxycodone 5-325mg  Contract: 09/16/2020 UDS: 09/16/2020 Last Visit: 01/07/2021 Next Visit: 05/09/2021 Last Refill: 01/07/2021 #120 and 0RF Pt sig: 1 tab q6h prn  Please Advise

## 2021-02-17 NOTE — Telephone Encounter (Signed)
Pt is needing rx for oxycodone 5/325 sent to [facility] on [address].

## 2021-02-17 NOTE — Telephone Encounter (Signed)
PDMP review, prescription sent 

## 2021-03-25 ENCOUNTER — Telehealth: Payer: Self-pay | Admitting: Internal Medicine

## 2021-03-25 NOTE — Telephone Encounter (Signed)
Requesting: oxycodone 5-325mg  Contract: 09/16/2020 UDS: 09/16/2020 Last Visit: 01/07/2021 Next Visit: None Last Refill: 02/17/2021 #120 and 0RF  Please Advise

## 2021-03-25 NOTE — Telephone Encounter (Signed)
Medication: oxyCODONE-acetaminophen (PERCOCET/ROXICET) 5-325 MG tablet [[id number]]   Has the patient contacted their pharmacy? No. (If no, request that the patient contact the pharmacy for the refill.) (If yes, when and what did the pharmacy advise?)  Preferred Pharmacy (with phone number or street name):[facility] [address] [place], [place] - [address]  [address], [place] [address]  Phone:  [phone]    Agent: Please be advised that RX refills may take up to 3 business days. We ask that you follow-up with your pharmacy.

## 2021-03-26 MED ORDER — OXYCODONE-ACETAMINOPHEN 5-325 MG PO TABS: 1.0000 | ORAL_TABLET | Freq: Four times a day (QID) | ORAL | 0 refills | Status: DC | PRN

## 2021-03-26 NOTE — Telephone Encounter (Signed)
PDMP okay, Rx sent 

## 2021-04-18 ENCOUNTER — Ambulatory Visit (INDEPENDENT_AMBULATORY_CARE_PROVIDER_SITE_OTHER): Payer: 59 | Admitting: Family

## 2021-04-18 ENCOUNTER — Telehealth: Payer: Self-pay | Admitting: Internal Medicine

## 2021-04-18 ENCOUNTER — Other Ambulatory Visit: Payer: Self-pay

## 2021-04-18 ENCOUNTER — Encounter: Payer: Self-pay | Admitting: Family

## 2021-04-18 VITALS — BP 140/80 | HR 62 | Temp 98.7°F | Ht 67.0 in | Wt 179.6 lb

## 2021-04-18 DIAGNOSIS — R3915 Urgency of urination: Secondary | ICD-10-CM | POA: Diagnosis not present

## 2021-04-18 LAB — POCT URINALYSIS DIP (MANUAL ENTRY)
Glucose, UA: NEGATIVE mg/dL
Ketones, POC UA: NEGATIVE mg/dL
Leukocytes, UA: NEGATIVE
Nitrite, UA: NEGATIVE
Protein Ur, POC: NEGATIVE mg/dL
Spec Grav, UA: >=1.03 — AB (ref 1.010–1.025)
Urobilinogen, UA: 0.2 E.U./dL
pH, UA: 6 (ref 5.0–8.0)

## 2021-04-18 MED ORDER — SULFAMETHOXAZOLE-TRIMETHOPRIM 800-160 MG PO TABS: 1.0000 | ORAL_TABLET | Freq: Two times a day (BID) | ORAL | 0 refills | Status: DC

## 2021-04-18 NOTE — Telephone Encounter (Signed)
error 

## 2021-04-18 NOTE — Progress Notes (Signed)
Alexandra Parsons is a 59 y.o. 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     bisoprolol-hydrochlorothiazide (ZIAC) 2.5-6.25 MG tablet Take 1 tablet by mouth daily. 90 tablet 1   docusate sodium (COLACE) 100 MG capsule Take 100 mg by mouth daily as needed for moderate constipation.     escitalopram (LEXAPRO) 20 MG tablet Take 1 tablet (20 mg total) by mouth daily. 90 tablet 1   ezetimibe (ZETIA) 10 MG tablet Take 1 tablet (10 mg total) by mouth daily. 90 tablet 1   metFORMIN (GLUCOPHAGE) 1000 MG tablet Take 1 tablet (1,000 mg total) by mouth 2 (two) times daily with a meal. 180 tablet 1   oxyCODONE-acetaminophen (PERCOCET/ROXICET) 5-325 MG tablet Take 1 tablet by mouth every 6 (six) hours as needed. 120 tablet 0   pioglitazone (ACTOS) 30 MG tablet Take 1 tablet (30 mg total) by mouth daily. 90 tablet 1   sitaGLIPtin (JANUVIA) 100 MG tablet Take 1 tablet (100 mg total) by mouth daily. 90 tablet 1   traZODone (DESYREL) 100 MG tablet Take 1 tablet (100 mg total) by mouth at bedtime as needed for sleep. 90 tablet 0   No current facility-administered medications for this visit.    Allergies: Erythromycin, Fluoxetine hcl, and Venlafaxine  Past Medical History:  Diagnosis Date    Abnormal Pap smear of cervix    h/o HPV   Chronic sinusitis, chronic cough (?COPD) 07/16/2009   Depression    Diabetes mellitus    type 2   Dyslipidemia    Elevated LFTs 11/19/2011   Hypertension    Leukocytosis 11/19/2011   Menopausal syndrome    Neck pain 05/20/2011   Sarcoid    post partum in remission   Submandibular gland swelling    Right    Past Surgical History:  Procedure Laterality Date   abdominal plasty remotely     NASAL SEPTUM SURGERY  12/26/2015   pilondial cyst removal     TUBAL LIGATION     TURBINATE REDUCTION Bilateral 12/26/2015    Family History  Problem Relation Age of Onset   Hypertension Other        GM   Colon cancer Other        COLON CA GGM x 2   Diabetes Other        aunt   CAD Maternal Grandfather 45   Stroke Neg Hx    Breast cancer Neg Hx     Social History   Tobacco Use   Smoking status: Every Day    Years: 0.50    Types: Cigarettes   Smokeless tobacco: Never   Tobacco comments:    1 to 0.6 ppd (started age 51)  Substance Use Topics   Alcohol use: Yes  Alcohol/week: 0.0 standard drinks    Comment: rare    Subjective:   Presents with concerns for possible UTI; urgency/ frequency; has had some episodes of incontinence; symptoms x 2-4 weeks;     Objective:  Vitals:   04/18/21 1359  BP: 140/80  Pulse: 62  Temp: 98.7 F (37.1 C)  TempSrc: Oral  SpO2: 99%  Weight: 179 lb 9.6 oz (81.5 kg)  Height: 5\' 7"  (1.702 m)    General: Well developed, well nourished, in no acute distress  Skin : Warm and dry.  Head: Normocephalic and atraumatic  Lungs: Respirations unlabored; clear to auscultation bilaterally without wheeze, rales, rhonchi  CVS exam: normal rate and regular rhythm.  Neurologic: Alert and oriented; speech intact; face symmetrical; moves all extremities well; CNII-XII intact without focal deficit   Assessment:  1. Urinary urgency     Plan:  Suspect UTI; check U/A and urine culture; Rx for Bactrim DS bid x 5 days;  increase fluids, rest and follow up worse, no better.   This visit occurred during the SARS-CoV-2 public health emergency.  Safety protocols were in place, including screening questions prior to the visit, additional usage of staff PPE, and extensive cleaning of exam room while observing appropriate contact time as indicated for disinfecting solutions.    No follow-ups on file.  Orders Placed This Encounter  Procedures   Urine Culture   POCT urinalysis dipstick    Requested Prescriptions    No prescriptions requested or ordered in this encounter

## 2021-04-20 LAB — URINE CULTURE
MICRO NUMBER:: 12353558
SPECIMEN QUALITY:: ADEQUATE

## 2021-05-05 ENCOUNTER — Other Ambulatory Visit: Payer: Self-pay

## 2021-05-05 MED ORDER — OXYCODONE-ACETAMINOPHEN 5-325 MG PO TABS: 1.0000 | ORAL_TABLET | Freq: Four times a day (QID) | ORAL | 0 refills | Status: DC | PRN

## 2021-05-05 NOTE — Telephone Encounter (Signed)
Pt has called stating that she needs a refill of her pain medication.   Pt has an appointment coming up 05/09/21  Last filled 03/26/21 # 120 with no refills   09/16/2020 UDS sample given. CS, cma

## 2021-05-09 ENCOUNTER — Encounter: Payer: Self-pay | Admitting: Internal Medicine

## 2021-05-09 ENCOUNTER — Ambulatory Visit (INDEPENDENT_AMBULATORY_CARE_PROVIDER_SITE_OTHER): Payer: 59 | Admitting: Internal Medicine

## 2021-05-09 ENCOUNTER — Other Ambulatory Visit: Payer: Self-pay

## 2021-05-09 ENCOUNTER — Ambulatory Visit: Payer: 59 | Attending: Internal Medicine

## 2021-05-09 ENCOUNTER — Telehealth (HOSPITAL_BASED_OUTPATIENT_CLINIC_OR_DEPARTMENT_OTHER): Payer: Self-pay

## 2021-05-09 VITALS — BP 126/80 | HR 68 | Temp 98.1°F | Resp 16 | Ht 67.0 in | Wt 179.4 lb

## 2021-05-09 DIAGNOSIS — E559 Vitamin D deficiency, unspecified: Secondary | ICD-10-CM | POA: Diagnosis not present

## 2021-05-09 DIAGNOSIS — F17200 Nicotine dependence, unspecified, uncomplicated: Secondary | ICD-10-CM

## 2021-05-09 DIAGNOSIS — Z23 Encounter for immunization: Secondary | ICD-10-CM | POA: Diagnosis not present

## 2021-05-09 DIAGNOSIS — Z0001 Encounter for general adult medical examination with abnormal findings: Secondary | ICD-10-CM | POA: Diagnosis not present

## 2021-05-09 DIAGNOSIS — E7800 Pure hypercholesterolemia, unspecified: Secondary | ICD-10-CM

## 2021-05-09 DIAGNOSIS — I10 Essential (primary) hypertension: Secondary | ICD-10-CM | POA: Diagnosis not present

## 2021-05-09 DIAGNOSIS — Z1211 Encounter for screening for malignant neoplasm of colon: Secondary | ICD-10-CM

## 2021-05-09 DIAGNOSIS — Z0000 Encounter for general adult medical examination without abnormal findings: Secondary | ICD-10-CM | POA: Diagnosis not present

## 2021-05-09 DIAGNOSIS — E119 Type 2 diabetes mellitus without complications: Secondary | ICD-10-CM | POA: Diagnosis not present

## 2021-05-09 DIAGNOSIS — Z122 Encounter for screening for malignant neoplasm of respiratory organs: Secondary | ICD-10-CM

## 2021-05-09 DIAGNOSIS — Z0100 Encounter for examination of eyes and vision without abnormal findings: Secondary | ICD-10-CM

## 2021-05-09 DIAGNOSIS — M797 Fibromyalgia: Secondary | ICD-10-CM

## 2021-05-09 DIAGNOSIS — Z1231 Encounter for screening mammogram for malignant neoplasm of breast: Secondary | ICD-10-CM

## 2021-05-09 LAB — CBC WITH DIFFERENTIAL/PLATELET
Basophils Absolute: 0 10*3/uL (ref 0.0–0.1)
Basophils Relative: 0.5 % (ref 0.0–3.0)
Eosinophils Absolute: 0.1 10*3/uL (ref 0.0–0.7)
Eosinophils Relative: 1.3 % (ref 0.0–5.0)
HCT: 38.7 % (ref 36.0–46.0)
Hemoglobin: 13.2 g/dL (ref 12.0–15.0)
Lymphocytes Relative: 20.7 % (ref 12.0–46.0)
Lymphs Abs: 1.7 10*3/uL (ref 0.7–4.0)
MCHC: 34.1 g/dL (ref 30.0–36.0)
MCV: 94.7 fl (ref 78.0–100.0)
Monocytes Absolute: 0.4 10*3/uL (ref 0.1–1.0)
Monocytes Relative: 5.2 % (ref 3.0–12.0)
Neutro Abs: 5.8 10*3/uL (ref 1.4–7.7)
Neutrophils Relative %: 72.3 % (ref 43.0–77.0)
Platelets: 211 10*3/uL (ref 150.0–400.0)
RBC: 4.09 Mil/uL (ref 3.87–5.11)
RDW: 15 % (ref 11.5–15.5)
WBC: 8 10*3/uL (ref 4.0–10.5)

## 2021-05-09 LAB — COMPREHENSIVE METABOLIC PANEL
ALT: 11 U/L (ref 0–35)
AST: 15 U/L (ref 0–37)
Albumin: 3.9 g/dL (ref 3.5–5.2)
Alkaline Phosphatase: 61 U/L (ref 39–117)
BUN: 14 mg/dL (ref 6–23)
CO2: 30 mEq/L (ref 19–32)
Calcium: 9.7 mg/dL (ref 8.4–10.5)
Chloride: 104 mEq/L (ref 96–112)
Creatinine, Ser: 0.77 mg/dL (ref 0.40–1.20)
GFR: 83.47 mL/min (ref >60.00)
Glucose, Bld: 117 mg/dL — ABNORMAL HIGH (ref 70–99)
Potassium: 4.3 mEq/L (ref 3.5–5.1)
Sodium: 139 mEq/L (ref 135–145)
Total Bilirubin: 0.5 mg/dL (ref 0.2–1.2)
Total Protein: 7.8 g/dL (ref 6.0–8.3)

## 2021-05-09 LAB — LIPID PANEL
Cholesterol: 138 mg/dL (ref 0–200)
HDL: 21.8 mg/dL — ABNORMAL LOW (ref >39.00)
LDL Cholesterol: 83 mg/dL (ref 0–99)
NonHDL: 115.9
Total CHOL/HDL Ratio: 6
Triglycerides: 164 mg/dL — ABNORMAL HIGH (ref 0.0–149.0)
VLDL: 32.8 mg/dL (ref 0.0–40.0)

## 2021-05-09 LAB — HEMOGLOBIN A1C: Hgb A1c MFr Bld: 6.1 % (ref 4.6–6.5)

## 2021-05-09 LAB — VITAMIN D 25 HYDROXY (VIT D DEFICIENCY, FRACTURES): VITD: 28.43 ng/mL — ABNORMAL LOW (ref 30.00–100.00)

## 2021-05-09 NOTE — Assessment & Plan Note (Signed)
-  Td 08/2017 -pnm 23-- 2017; prevnar at 64 -shingrix discussed , #1 feb-2020: arm hurt x 30 days ,declined a booster -Had Covid vax x2, rec booster pros>>cons d/w pt  -Flu shot today -- CCS: 03-2009.  Due for repeat colonoscopy. Recommend to contact Dr. Christella Hartigan --female care :last MMG 09/2018, recommend to arrange a follow-up mammogram and rec to see gynecology --lung ca screening: CT 05-2019, + pulmonary nodules, she agreed to re-do CT , referral sent  --DEXA : Normal 09-2018.   --Diet-exercise: Discussed - Tobacco: still smoking, rec to think about the patch or nicotine gum --Labs: CMP, FLP, CBC, A1c, vitamin D -POA discussed

## 2021-05-09 NOTE — Progress Notes (Signed)
   Covid-19 Vaccination Clinic  Name:  Alexandra Parsons    MRN: [id number] DOB: 30-Jan-1961  05/09/2021  Alexandra Parsons was observed post Covid-19 immunization for 15 minutes without incident. She was provided with Vaccine Information Sheet and instruction to access the V-Safe system.   Alexandra Parsons was instructed to call 911 with any severe reactions post vaccine: Difficulty breathing  Swelling of face and throat  A fast heartbeat  A bad rash all over body  Dizziness and weakness

## 2021-05-09 NOTE — Progress Notes (Signed)
Subjective:    Patient ID: Alexandra Parsons, female    DOB: 1960/09/17, 60 y.o.   MRN: [id number]  DOS:  05/09/2021 Type of visit - description: CPX  Since the last office visit she is doing "okay". Feels no different, no new symptoms. Pain & depression, about the same. She again denies any suicidal ideas Had a UTI, symptoms resolved.   Wt Readings from Last 3 Encounters:  05/09/21 179 lb 6 oz (81.4 kg)  04/18/21 179 lb 9.6 oz (81.5 kg)  01/07/21 187 lb 12.8 oz (85.2 kg)    Review of Systems  Other than above, a 14 point review of systems is negative       Past Medical History:  Diagnosis Date   Abnormal Pap smear of cervix    h/o HPV   Chronic sinusitis, chronic cough (?COPD) 07/16/2009   Depression    Diabetes mellitus    type 2   Dyslipidemia    Elevated LFTs 11/19/2011   Hypertension    Leukocytosis 11/19/2011   Menopausal syndrome    Neck pain 05/20/2011   Sarcoid    post partum in remission   Submandibular gland swelling    Right    Past Surgical History:  Procedure Laterality Date   abdominal plasty remotely     NASAL SEPTUM SURGERY  12/26/2015   pilondial cyst removal     TUBAL LIGATION     TURBINATE REDUCTION Bilateral 12/26/2015   Family History  Problem Relation Age of Onset   Hypertension Other        GM   Colon cancer Other        COLON CA GGM x 2   Diabetes Other        aunt   CAD Maternal Grandfather 45   Stroke Neg Hx    Breast cancer Neg Hx    Social History   Socioeconomic History   Marital status: Widowed    Spouse name: Not on file   Number of children: 1   Years of education: Not on file   Highest education level: Not on file  Occupational History   Occupation: RN [employer]  Tobacco Use   Smoking status: Every Day    Years: 0.50    Types: Cigarettes   Smokeless tobacco: Never   Tobacco comments:    1 to 0.6 ppd (started age 48)  Substance and Sexual Activity   Alcohol use: Yes    Alcohol/week: 0.0 standard drinks     Comment: rare   Drug use: No   Sexual activity: Not on file  Other Topics Concern   Not on file  Social History Narrative   Lost husband, prostate cancer, 08-2012   Lives by herself   Has a daughter , lives in [place]   Social Determinants of Health   Financial Resource Strain: Not on file  Food Insecurity: Not on file  Transportation Needs: Not on file  Physical Activity: Not on file  Stress: Not on file  Social Connections: Not on file  Intimate Partner Violence: Not on file     Allergies as of 05/09/2021       Reactions   Erythromycin    REACTION: palpitations; ZPACK is ok   Fluoxetine Hcl    REACTION: hallucinations   Venlafaxine    REACTION: made her feel bad        Medication List        Accurate as of May 09, 2021  2:23 PM. If you  have any questions, ask your nurse or doctor.          STOP taking these medications    sulfamethoxazole-trimethoprim 800-160 MG tablet Commonly known as: BACTRIM DS Stopped by: Willow Ora, MD       TAKE these medications    aspirin EC 81 MG tablet Take 81 mg by mouth daily.   bisoprolol-hydrochlorothiazide 2.5-6.25 MG tablet Commonly known as: ZIAC Take 1 tablet by mouth daily.   docusate sodium 100 MG capsule Commonly known as: COLACE Take 100 mg by mouth daily as needed for moderate constipation.   escitalopram 20 MG tablet Commonly known as: LEXAPRO Take 1 tablet (20 mg total) by mouth daily.   ezetimibe 10 MG tablet Commonly known as: ZETIA Take 1 tablet (10 mg total) by mouth daily.   metFORMIN 1000 MG tablet Commonly known as: GLUCOPHAGE Take 1 tablet (1,000 mg total) by mouth 2 (two) times daily with a meal.   oxyCODONE-acetaminophen 5-325 MG tablet Commonly known as: PERCOCET/ROXICET Take 1 tablet by mouth every 6 (six) hours as needed.   pioglitazone 30 MG tablet Commonly known as: ACTOS Take 1 tablet (30 mg total) by mouth daily.   sitaGLIPtin 100 MG tablet Commonly known as:  Januvia Take 1 tablet (100 mg total) by mouth daily.   traZODone 100 MG tablet Commonly known as: DESYREL Take 1 tablet (100 mg total) by mouth at bedtime as needed for sleep.           Objective:   Physical Exam BP 126/80 (BP Location: Left Arm, Patient Position: Sitting, Cuff Size: Small)   Pulse 68   Temp 98.1 F (36.7 C) (Oral)   Resp 16   Ht 5\' 7"  (1.702 m)   Wt 179 lb 6 oz (81.4 kg)   SpO2 98%   BMI 28.09 kg/m  General: Well developed, NAD, BMI noted Neck: No  thyromegaly  HEENT:  Normocephalic . Face symmetric, atraumatic Lungs:  CTA B Normal respiratory effort, no intercostal retractions, no accessory muscle use. Heart: RRR,  no murmur.  Abdomen:  Not distended, soft, non-tender. No rebound or rigidity.   Lower extremities: no pretibial edema bilaterally  Skin: Exposed areas without rash. Not pale. Not jaundice Neurologic:  alert & oriented X3.  Speech normal, gait appropriate for age and unassisted Strength symmetric and appropriate for age.  Psych: Cognition and judgment appear intact.  Cooperative with normal attention span and concentration.  Behavior appropriate. No anxious or depressed appearing.     Assessment     Assessment DM HTN Dyslipidemia: intolerant to zocor ~ 2014 and  Lipitor 04/2018 Vit d def   CT 09-2018 ---COPD ---Pulmonary nodules, declined further CTs on 05-2019 --- (+)  Coronary calcium   Smoker ~1  ppd Psych: --Depression anxiety --ADHD MSK:  Fibromyalgia  (see OV 01/07/2021, 05/09/2021) Neck pain, chronic, on pain meds prn, UDS 09-2013 risk; intolerant to vicodin, oxycodone ok GYN: --BTL, Menopausal , h/o  Abnormal Pap smear, HPV Elevated LFTs: Hepatitis serology (-) 2008 H/o Sarcoid postpartum in remission H/o Chronic cough: Chronic sinusitis? COPD? H/o Leukocytosis  H/o +  PPD   PLAN Here for CPX DM: Last A1c pretty good, continue Metformin, Actos, Januvia.  Check A1c HTN: Seems controlled, continue  Ziac. Fibromyalgia: [place] rheumatology declined to see the patient, symptoms are the same, I do believe the patient has fibromyalgia. Dyslipidemia: Intolerant to simvastatin and Lipitor, on Zetia, CV RF remains quite elevated, checking FLP.  Consider Crestor. Depression, anxiety: Since the last  visit, trazodone was increased 100 mg, she is also on Lexapro.  Reports that she is no better or worse ("I stay  depressed and that will never change") but she is resting better with trazodone.  Denies any symptoms of serotonin syndrome.  Denies suicidal ideas. Psychiatry referral?  Declined   Decrease dose of Lexapro?  Declined Plan: Continue with present care, tolerating medications okay. Vitamin D deficiency: Labs, not on supplements currently, rec 1000 u qd  RTC 4 months   In addition to CPX, I addressed all her chronic medical problems   This visit occurred during the SARS-CoV-2 public health emergency.  Safety protocols were in place, including screening questions prior to the visit, additional usage of staff PPE, and extensive cleaning of exam room while observing appropriate contact time as indicated for disinfecting solutions.

## 2021-05-09 NOTE — Patient Instructions (Addendum)
Recommend to proceed with the following vaccines at your pharmacy:  Covid #3  You are overdue for your repeat colonoscopy with Dr. Christella Hartigan. Please call his office at [phone] to get on his schedule.  We have placed a referral to [facility]) for your diabetic eye exam. Please expect a call in the next several days to schedule an appointment.   Check the  blood pressure regularly BP GOAL is between 110/65 and  135/85. If it is consistently higher or lower, let me know      GO TO THE LAB : Get the blood work     GO TO THE FRONT DESK, PLEASE SCHEDULE YOUR APPOINTMENTS Come back for   a checkup in 4 months    "Living will", "Health Care Power of attorney": Advanced care planning  (If you already have a living will or healthcare power of attorney, please bring the copy to be scanned in your chart.)  Advance care planning is a process that supports adults in  understanding and sharing their preferences regarding future medical care.   The patient's preferences are recorded in documents called Advance Directives.    Advanced directives are completed (and can be modified at any time) while the patient is in full mental capacity.   The documentation should be available at all times to the patient, the family and the healthcare providers.  Bring in a copy to be scanned in your chart is an excellent idea and is recommended   This legal documents direct treatment decision making and/or appoint a surrogate to make the decision if the patient is not capable to do so.    Advance directives can be documented in many types of formats,  documents have names such as:  Lliving will  Durable power of attorney for healthcare (healthcare proxy or healthcare power of attorney)  Combined directives  Physician orders for life-sustaining treatment    More information at:  [email or web address]

## 2021-05-09 NOTE — Assessment & Plan Note (Signed)
Here for CPX DM: Last A1c pretty good, continue Metformin, Actos, Januvia.  Check A1c HTN: Seems controlled, continue Ziac. Fibromyalgia: [place] rheumatology declined to see the patient, symptoms are the same, I do believe the patient has fibromyalgia. Dyslipidemia: Intolerant to simvastatin and Lipitor, on Zetia, CV RF remains quite elevated, checking FLP.  Consider Crestor. Depression, anxiety: Since the last visit, trazodone was increased 100 mg, she is also on Lexapro.  Reports that she is no better or worse ("I stay  depressed and that will never change") but she is resting better with trazodone.  Denies any symptoms of serotonin syndrome.  Denies suicidal ideas. Psychiatry referral?  Declined   Decrease dose of Lexapro?  Declined Plan: Continue with present care, tolerating medications okay. Vitamin D deficiency: Labs, not on supplements currently, rec 1000 u qd  RTC 4 months

## 2021-05-12 MED ORDER — VITAMIN D (ERGOCALCIFEROL) 1.25 MG (50000 UNIT) PO CAPS: 50000.0000 [IU] | ORAL_CAPSULE | ORAL | 0 refills | Status: DC

## 2021-05-12 MED ORDER — ROSUVASTATIN CALCIUM 10 MG PO TABS: 10.0000 mg | ORAL_TABLET | ORAL | 0 refills | Status: DC

## 2021-05-12 NOTE — Addendum Note (Signed)
Addended byConrad Tazlina D on: 05/12/2021 08:23 AM   Modules accepted: Orders

## 2021-05-20 ENCOUNTER — Other Ambulatory Visit (HOSPITAL_BASED_OUTPATIENT_CLINIC_OR_DEPARTMENT_OTHER): Payer: Self-pay

## 2021-05-20 MED ORDER — MODERNA COVID-19 BIVAL BOOSTER 50 MCG/0.5ML IM SUSP
INTRAMUSCULAR | 0 refills | Status: DC
Start: 2021-05-09 — End: 2021-10-16
  Filled 2021-05-20: qty 0.5, 1d supply, fill #0

## 2021-05-23 ENCOUNTER — Telehealth (HOSPITAL_BASED_OUTPATIENT_CLINIC_OR_DEPARTMENT_OTHER): Payer: Self-pay

## 2021-06-10 ENCOUNTER — Telehealth (HOSPITAL_BASED_OUTPATIENT_CLINIC_OR_DEPARTMENT_OTHER): Payer: Self-pay

## 2021-06-12 ENCOUNTER — Other Ambulatory Visit: Payer: Self-pay | Admitting: Internal Medicine

## 2021-06-12 MED ORDER — OXYCODONE-ACETAMINOPHEN 5-325 MG PO TABS: 1.0000 | ORAL_TABLET | Freq: Four times a day (QID) | ORAL | 0 refills | Status: DC | PRN

## 2021-06-12 NOTE — Telephone Encounter (Signed)
Medication: oxyCODONE-acetaminophen (PERCOCET/ROXICET) 5-325 MG tablet  Has the patient contacted their pharmacy? No. (If no, request that the patient contact the pharmacy for the refill.) (If yes, when and what did the pharmacy advise?)  Preferred Pharmacy (with phone number or street name): [facility] [address] [place], [place] - [address]  [address], [place] [address]  Phone:  [phone]  Fax:  [phone]   Agent: Please be advised that RX refills may take up to 3 business days. We ask that you follow-up with your pharmacy.

## 2021-06-12 NOTE — Telephone Encounter (Signed)
PDMP okay, RF sent 

## 2021-06-12 NOTE — Telephone Encounter (Signed)
Requesting: oxycodone Contract: 09/16/20 UDS:09/16/20 Last Visit:05/09/21 Next Visit:n/a Last Refill:05/05/21  Please Advise

## 2021-07-07 ENCOUNTER — Other Ambulatory Visit: Payer: Self-pay | Admitting: Internal Medicine

## 2021-07-21 ENCOUNTER — Telehealth: Payer: Self-pay | Admitting: Internal Medicine

## 2021-07-21 NOTE — Telephone Encounter (Signed)
Medication: oxyCODONE-acetaminophen (PERCOCET/ROXICET) 5-325 MG tablet  Has the patient contacted their pharmacy? No. (If no, request that the patient contact the pharmacy for the refill.) (If yes, when and what did the pharmacy advise?)  Preferred Pharmacy (with phone number or street name): [facility] - [place], [place] - [address]  [address], [place] [address]  Phone:  [phone]  Fax:  [phone]   Agent: Please be advised that RX refills may take up to 3 business days. We ask that you follow-up with your pharmacy.  

## 2021-07-21 NOTE — Telephone Encounter (Signed)
Requesting: oxycodone 5-325mg   Contract: 09/16/2020 UDS: 09/16/2020 Last Visit: 05/09/2021 Next Visit: None Last Refill: 06/12/2021 #120 and 0RF  Please Advise

## 2021-07-22 MED ORDER — OXYCODONE-ACETAMINOPHEN 5-325 MG PO TABS: 1.0000 | ORAL_TABLET | Freq: Four times a day (QID) | ORAL | 0 refills | Status: DC | PRN

## 2021-07-22 NOTE — Telephone Encounter (Signed)
Pdmp ok, rx sent  ? ?

## 2021-07-24 ENCOUNTER — Telehealth: Payer: Self-pay | Admitting: Internal Medicine

## 2021-07-24 ENCOUNTER — Other Ambulatory Visit (HOSPITAL_BASED_OUTPATIENT_CLINIC_OR_DEPARTMENT_OTHER): Payer: Self-pay

## 2021-07-24 NOTE — Telephone Encounter (Signed)
Can you resend oxycodone prescription downstairs- her routine pharmacy [facility] is out.

## 2021-07-24 NOTE — Telephone Encounter (Signed)
[facility] called and stated they currently dont have the medication below in stock. They stated that their truck came and they dont know when they will. They are asking for [place] to send it to another pharmacy and let the pt know which pharmacy it gets sent to.  oxyCODONE-acetaminophen (PERCOCET/ROXICET) 5-325 MG tablet

## 2021-07-24 NOTE — Telephone Encounter (Signed)
Patient called back to see what pharmacy it was sent to. She was advised that [facility] would be [facility] to [facility] but it was still pending.

## 2021-07-25 ENCOUNTER — Other Ambulatory Visit (HOSPITAL_BASED_OUTPATIENT_CLINIC_OR_DEPARTMENT_OTHER): Payer: Self-pay

## 2021-07-25 MED ORDER — OXYCODONE-ACETAMINOPHEN 5-325 MG PO TABS
1.0000 | ORAL_TABLET | Freq: Four times a day (QID) | ORAL | 0 refills | Status: DC | PRN
  Filled 2021-07-25: qty 120, 30d supply, fill #0

## 2021-07-25 NOTE — Telephone Encounter (Signed)
done

## 2021-08-28 ENCOUNTER — Telehealth: Payer: Self-pay | Admitting: Internal Medicine

## 2021-08-28 ENCOUNTER — Other Ambulatory Visit (HOSPITAL_BASED_OUTPATIENT_CLINIC_OR_DEPARTMENT_OTHER): Payer: Self-pay

## 2021-08-28 DIAGNOSIS — F17200 Nicotine dependence, unspecified, uncomplicated: Secondary | ICD-10-CM

## 2021-08-28 MED ORDER — OXYCODONE-ACETAMINOPHEN 5-325 MG PO TABS
1.0000 | ORAL_TABLET | Freq: Four times a day (QID) | ORAL | 0 refills | Status: DC | PRN
  Filled 2021-08-28: qty 120, 30d supply, fill #0

## 2021-08-28 NOTE — Telephone Encounter (Signed)
Letter sent to Pt making her aware she is due for follow-up.

## 2021-08-28 NOTE — Telephone Encounter (Signed)
Pt due- last 2020. Referral placed.

## 2021-08-28 NOTE — Telephone Encounter (Signed)
Pt called and stated that Dr.Paz stated he would like pt to get a lung CT scan . I don't see orders in system. Please advise.

## 2021-08-28 NOTE — Telephone Encounter (Signed)
Requesting: oxycodone 5-325mg   Contract: 09/16/2020 UDS: 09/16/2020 Last Visit: 05/09/2021 Next Visit: None Last Refill: 07/25/2021 #120 and 0RF Pt sig: 1 tab q6h prn  Please Advise

## 2021-08-28 NOTE — Telephone Encounter (Signed)
Medication: oxyCODONE-acetaminophen (PERCOCET/ROXICET) 5-325 MG tablet  Has the patient contacted their pharmacy? Yes.   (If no, request that the patient contact the pharmacy for the refill.) (If yes, when and what did the pharmacy advise?)  Preferred Pharmacy (with phone number or street name):  [facility]  [address] [place] [address]  Phone:  [phone]  Fax:  [phone]   Agent: Please be advised that RX refills may take up to 3 business days. We ask that you follow-up with your pharmacy.

## 2021-08-28 NOTE — Telephone Encounter (Signed)
Advise patient: She is due for follow-up. PDMP okay, prescription sent

## 2021-09-01 ENCOUNTER — Inpatient Hospital Stay (HOSPITAL_BASED_OUTPATIENT_CLINIC_OR_DEPARTMENT_OTHER): Admission: RE | Admit: 2021-09-01 | Payer: 59 | Source: Ambulatory Visit

## 2021-09-02 ENCOUNTER — Telehealth (HOSPITAL_BASED_OUTPATIENT_CLINIC_OR_DEPARTMENT_OTHER): Payer: Self-pay

## 2021-09-29 ENCOUNTER — Telehealth: Payer: Self-pay | Admitting: Internal Medicine

## 2021-09-29 NOTE — Telephone Encounter (Signed)
Requesting: oxycodone 5-325mg   Contract: 09/16/2020 UDS: 09/16/2020 Last Visit: 04/18/2021 Next Visit: None Last Refill: 08/28/2021 #120 and 0RF  Please Advise

## 2021-09-29 NOTE — Telephone Encounter (Signed)
Medication:  oxyCODONE-acetaminophen (PERCOCET/ROXICET) 5-325 MG tablet [[id number]]     Has the patient contacted their pharmacy? No. (If no, request that the patient contact the pharmacy for the refill.) (If yes, when and what did the pharmacy advise?)     Preferred Pharmacy (with phone number or street name):  [facility]  [address], [address] [place] [address]  Phone:  [phone]  Fax:  [phone]     Agent: Please be advised that RX refills may take up to 3 business days. We ask that you follow-up with your pharmacy.

## 2021-09-30 ENCOUNTER — Other Ambulatory Visit (HOSPITAL_BASED_OUTPATIENT_CLINIC_OR_DEPARTMENT_OTHER): Payer: Self-pay

## 2021-09-30 MED ORDER — OXYCODONE-ACETAMINOPHEN 5-325 MG PO TABS
1.0000 | ORAL_TABLET | Freq: Four times a day (QID) | ORAL | 0 refills | Status: DC | PRN
  Filled 2021-09-30: qty 100, 25d supply, fill #0

## 2021-09-30 NOTE — Telephone Encounter (Signed)
PDMP okay. Please notify patient, she is due for office visit, no further refill without it.  I only sent 100 tablets today.

## 2021-09-30 NOTE — Telephone Encounter (Signed)
Mychart message sent.

## 2021-10-10 ENCOUNTER — Encounter: Payer: Self-pay | Admitting: Internal Medicine

## 2021-10-16 ENCOUNTER — Other Ambulatory Visit (HOSPITAL_BASED_OUTPATIENT_CLINIC_OR_DEPARTMENT_OTHER): Payer: Self-pay

## 2021-10-16 ENCOUNTER — Encounter: Payer: Self-pay | Admitting: Internal Medicine

## 2021-10-16 ENCOUNTER — Ambulatory Visit (INDEPENDENT_AMBULATORY_CARE_PROVIDER_SITE_OTHER): Payer: BC Managed Care – PPO | Admitting: Internal Medicine

## 2021-10-16 VITALS — BP 126/80 | HR 75 | Temp 98.2°F | Resp 16 | Ht 67.0 in | Wt 169.4 lb

## 2021-10-16 DIAGNOSIS — M797 Fibromyalgia: Secondary | ICD-10-CM

## 2021-10-16 DIAGNOSIS — Z79899 Other long term (current) drug therapy: Secondary | ICD-10-CM | POA: Diagnosis not present

## 2021-10-16 DIAGNOSIS — F32A Depression, unspecified: Secondary | ICD-10-CM

## 2021-10-16 DIAGNOSIS — R634 Abnormal weight loss: Secondary | ICD-10-CM | POA: Diagnosis not present

## 2021-10-16 DIAGNOSIS — E119 Type 2 diabetes mellitus without complications: Secondary | ICD-10-CM | POA: Diagnosis not present

## 2021-10-16 DIAGNOSIS — M542 Cervicalgia: Secondary | ICD-10-CM | POA: Diagnosis not present

## 2021-10-16 DIAGNOSIS — E559 Vitamin D deficiency, unspecified: Secondary | ICD-10-CM

## 2021-10-16 DIAGNOSIS — I10 Essential (primary) hypertension: Secondary | ICD-10-CM | POA: Diagnosis not present

## 2021-10-16 DIAGNOSIS — F419 Anxiety disorder, unspecified: Secondary | ICD-10-CM

## 2021-10-16 DIAGNOSIS — Z122 Encounter for screening for malignant neoplasm of respiratory organs: Secondary | ICD-10-CM

## 2021-10-16 LAB — CBC WITH DIFFERENTIAL/PLATELET
Basophils Absolute: 0 10*3/uL (ref 0.0–0.1)
Basophils Relative: 0.5 % (ref 0.0–3.0)
Eosinophils Absolute: 0.1 10*3/uL (ref 0.0–0.7)
Eosinophils Relative: 1.2 % (ref 0.0–5.0)
HCT: 38.6 % (ref 36.0–46.0)
Hemoglobin: 13.2 g/dL (ref 12.0–15.0)
Lymphocytes Relative: 19.1 % (ref 12.0–46.0)
Lymphs Abs: 1.6 10*3/uL (ref 0.7–4.0)
MCHC: 34.1 g/dL (ref 30.0–36.0)
MCV: 94.6 fl (ref 78.0–100.0)
Monocytes Absolute: 0.5 10*3/uL (ref 0.1–1.0)
Monocytes Relative: 6 % (ref 3.0–12.0)
Neutro Abs: 6.1 10*3/uL (ref 1.4–7.7)
Neutrophils Relative %: 73.2 % (ref 43.0–77.0)
Platelets: 207 10*3/uL (ref 150.0–400.0)
RBC: 4.08 Mil/uL (ref 3.87–5.11)
RDW: 14.5 % (ref 11.5–15.5)
WBC: 8.4 10*3/uL (ref 4.0–10.5)

## 2021-10-16 LAB — BASIC METABOLIC PANEL
BUN: 15 mg/dL (ref 6–23)
CO2: 28 mEq/L (ref 19–32)
Calcium: 9.4 mg/dL (ref 8.4–10.5)
Chloride: 103 mEq/L (ref 96–112)
Creatinine, Ser: 0.7 mg/dL (ref 0.40–1.20)
GFR: 93.29 mL/min (ref >60.00)
Glucose, Bld: 141 mg/dL — ABNORMAL HIGH (ref 70–99)
Potassium: 4.2 mEq/L (ref 3.5–5.1)
Sodium: 137 mEq/L (ref 135–145)

## 2021-10-16 LAB — HEMOGLOBIN A1C: Hgb A1c MFr Bld: 6.2 % (ref 4.6–6.5)

## 2021-10-16 LAB — TSH: TSH: 1.86 u[IU]/mL (ref 0.35–5.50)

## 2021-10-16 MED ORDER — ROSUVASTATIN CALCIUM 10 MG PO TABS
10.0000 mg | ORAL_TABLET | ORAL | 5 refills | Status: DC
  Filled 2021-10-16: qty 12, 28d supply, fill #0

## 2021-10-16 NOTE — Assessment & Plan Note (Signed)
DM: Last A1c was very good at 6.1.  Feet exam negative today.  Ambulatory CBGs not fasting around 160.  continue metformin, Actos, Januvia.  Check A1c. ?HTN: Seems well controlled, continue Ziac, check a CBC and BMP ?High cholesterol: At the last visit, her CV RF was quite elevated, LDL was 83, she was on Zetia, we added Crestor 3 times a week.   Ran out of Crestor 2 weeks ago, RF sent, check FLP at the next opportunity ?Vitamin D deficiency: Last levels were decreased, on ergocalciferol weekly, recommend OTC vitamin D 2000 units daily ?Depression, anxiety: Ongoing symptoms, on lexapro, sleeping okay with trazodone. No change, see previous entry ?Weight loss: ROS benign, checking general labs, also TSH. ?Chronic neck pain, on oxycodone, UDS ?Preventive care: ?Again encouraged to contact GI for colonoscopy ?Has not pursue a CT chest, will refer her. ?RTC 3 months ? ?  ?

## 2021-10-16 NOTE — Progress Notes (Signed)
? ?Subjective:  ? ? Patient ID: Alexandra Parsons, female    DOB: 1960-12-22, 61 y.o.   MRN: [id number] ? ?DOS:  10/16/2021 ?Type of visit - description: Follow-up ? ?Weight loss noted. ?States she is not doing anything special, perhaps making better food choices. ?Denies fever chills ?No nausea vomiting ?No blood in the stools ?She has a smoker's cough with no hemoptysis. ? ?Continue feeling depressed, PHQ-9: Scored 10 which is moderate, no suicidal ideas. ?Thinks it is work-related, "I have a bully as a boss". ? ?Wt Readings from Last 3 Encounters:  ?10/16/21 169 lb 6 oz (76.8 kg)  ?05/09/21 179 lb 6 oz (81.4 kg)  ?04/18/21 179 lb 9.6 oz (81.5 kg)  ? ? ? ?Review of Systems ?No lower extremity paresthesias ? ?Past Medical History:  ?Diagnosis Date  ? Abnormal Pap smear of cervix   ? h/o HPV  ? Chronic sinusitis, chronic cough (?COPD) 07/16/2009  ? Depression   ? Diabetes mellitus   ? type 2  ? Dyslipidemia   ? Elevated LFTs 11/19/2011  ? Hypertension   ? Leukocytosis 11/19/2011  ? Menopausal syndrome   ? Neck pain 05/20/2011  ? 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 Submandibular gland swelling   ? Right  ? ? ?Past Surgical History:  ?Procedure Laterality Date  ? abdominal plasty remotely    ? NASAL SEPTUM SURGERY  12/26/2015  ? pilondial cyst removal    ? TUBAL LIGATION    ? TURBINATE REDUCTION Bilateral 12/26/2015  ? ? ?Current Outpatient Medications  ?Medication Instructions  ? aspirin EC 81 mg, Oral, Daily  ? bisoprolol-hydrochlorothiazide (ZIAC) 2.5-6.25 MG tablet 1 tablet, Oral, Daily  ? docusate sodium (COLACE) 250 mg, Oral, Daily PRN  ? escitalopram (LEXAPRO) 20 MG tablet Take 1 tablet by mouth once daily  ? ezetimibe (ZETIA) 10 mg, Oral, Daily  ? metFORMIN (GLUCOPHAGE) 1,000 mg, Oral, 2 times daily with meals  ? oxyCODONE-acetaminophen (PERCOCET/ROXICET) 5-325 MG tablet 1 tablet, Oral, Every 6 hours PRN  ? pioglitazone (ACTOS) 30 mg, Oral, Daily  ? [START ON 10/17/2021] rosuvastatin (CRESTOR) 10 mg, Oral, Every M-W-F   ? sitaGLIPtin (JANUVIA) 100 mg, Oral, Daily  ? traZODone (DESYREL) 100 mg, Oral, At bedtime PRN  ? ? ?   ?Objective:  ? Physical Exam ?BP 126/80 (BP Location: Left Arm, Patient Position: Sitting, Cuff Size: Small)   Pulse 75   Temp 98.2 ?F (36.8 ?C) (Oral)   Resp 16   Ht 5\' 7"  (1.702 m)   Wt 169 lb 6 oz (76.8 kg)   SpO2 98%   BMI 26.53 kg/m?  ?General:   ?Well developed, NAD, BMI noted. ?HEENT:  ?Normocephalic . Face symmetric, atraumatic ?Lungs:  ?CTA B ?Normal respiratory effort, no intercostal retractions, no accessory muscle use. ?Heart: RRR,  no murmur.  ?DM foot exam: No edema, good pedal pulses, pinprick examination normal ?Skin: Not pale. Not jaundice ?Neurologic:  ?alert & oriented X3.  ?Speech normal, gait appropriate for age and unassisted ?Psych--  ?Cognition and judgment appear intact.  ?Cooperative with normal attention span and concentration.  ?Behavior appropriate. ?No anxious or depressed appearing.  ? ?   ?Assessment   ? ?  ? Assessment ?DM ?HTN ?Dyslipidemia: intolerant to zocor ~ 2014 and  Lipitor 04/2018 ?Vit d def   ?CT 09-2018 ?---COPD ?---Pulmonary nodules, declined further CTs on 05-2019 ?--- (+)  Coronary calcium   ?Smoker ~1  ppd ?Psych: ?--Depression anxiety ?--ADHD ?MSK:  ?Fibromyalgia  (  see OV 01/07/2021, 05/09/2021) ?Neck pain, chronic, on pain meds prn, UDS 09-2013 risk; intolerant to vicodin, oxycodone ok ?GYN: ?--BTL, Menopausal , h/o  Abnormal Pap smear, HPV ?Elevated LFTs: Hepatitis serology (-) 2008 ?H/o Sarcoid postpartum in remission ?H/o Chronic cough: Chronic sinusitis? COPD? ?H/o Leukocytosis  ?H/o +  PPD ?  ?PLAN ?DM: Last A1c was very good at 6.1.  Feet exam negative today.  Ambulatory CBGs not fasting around 160.  continue metformin, Actos, Januvia.  Check A1c. ?HTN: Seems well controlled, continue Ziac, check a CBC and BMP ?High cholesterol: At the last visit, her CV RF was quite elevated, LDL was 83, she was on Zetia, we added Crestor 3 times a week.   Ran out of  Crestor 2 weeks ago, RF sent, check FLP at the next opportunity ?Vitamin D deficiency: Last levels were decreased, on ergocalciferol weekly, recommend OTC vitamin D 2000 units daily ?Depression, anxiety: Ongoing symptoms, on lexapro, sleeping okay with trazodone. No change, see previous entry ?Weight loss: ROS benign, checking general labs, also TSH. ?Chronic neck pain, on oxycodone, UDS ?Preventive care: ?Again encouraged to contact GI for colonoscopy ?Has not pursue a CT chest, will refer her. ?RTC 3 months ? ?  ? ?This visit occurred during the SARS-CoV-2 public health emergency.  Safety protocols were in place, including screening questions prior to the visit, additional usage of staff PPE, and extensive cleaning of exam room while observing appropriate contact time as indicated for disinfecting solutions.  ? ?

## 2021-10-16 NOTE — Patient Instructions (Addendum)
Per our records you are due for your diabetic eye exam. Please contact your eye doctor to schedule an appointment. Please have them send copies of your office visit notes to [place]. Our fax number is [phone]. If you need a referral to an eye doctor please let us know. ? ? ?We are referring you for CAT scan of the chest, if you do not hear from them in 1 week please let me know ? ?Reach out to gastroenterology, you are due for a colonoscopy.  Is important you proceed. ? ?Start vitamin D3 over-the-counter: 2000 units daily ? ?We are refilling Crestor to be taken 3 times a week. ? ?Check the  blood pressure regularly ?BP GOAL is between 110/65 and  135/85. ?If it is consistently higher or lower, let me know ? ?  ? ?GO TO THE LAB : Get the blood work   ? ? ?GO TO THE FRONT DESK, PLEASE SCHEDULE YOUR APPOINTMENTS ?Come back for   a checkup in 3 months ?

## 2021-10-18 LAB — DRUG MONITORING PANEL 375977 , URINE
Alcohol Metabolites: NEGATIVE ng/mL (ref <500)
Amphetamine: NEGATIVE ng/mL (ref <250)
Amphetamines: NEGATIVE ng/mL (ref <500)
Barbiturates: NEGATIVE ng/mL (ref <300)
Benzodiazepines: NEGATIVE ng/mL (ref <100)
Cocaine Metabolite: NEGATIVE ng/mL (ref <150)
Codeine: NEGATIVE ng/mL (ref <50)
Desmethyltramadol: NEGATIVE ng/mL (ref <100)
Hydrocodone: NEGATIVE ng/mL (ref <50)
Hydromorphone: NEGATIVE ng/mL (ref <50)
Marijuana Metabolite: NEGATIVE ng/mL (ref <20)
Methamphetamine: NEGATIVE ng/mL (ref <250)
Morphine: NEGATIVE ng/mL (ref <50)
Norhydrocodone: NEGATIVE ng/mL (ref <50)
Noroxycodone: >10000 ng/mL — ABNORMAL HIGH (ref <50)
Opiates: NEGATIVE ng/mL (ref <100)
Oxycodone: 4173 ng/mL — ABNORMAL HIGH (ref <50)
Oxycodone: POSITIVE ng/mL — AB (ref <100)
Oxymorphone: 703 ng/mL — ABNORMAL HIGH (ref <50)
Tramadol: NEGATIVE ng/mL (ref <100)

## 2021-10-18 LAB — DM TEMPLATE

## 2021-10-22 ENCOUNTER — Other Ambulatory Visit: Payer: Self-pay | Admitting: Internal Medicine

## 2021-10-22 ENCOUNTER — Telehealth: Payer: Self-pay | Admitting: Internal Medicine

## 2021-10-22 ENCOUNTER — Other Ambulatory Visit (HOSPITAL_BASED_OUTPATIENT_CLINIC_OR_DEPARTMENT_OTHER): Payer: Self-pay

## 2021-10-22 MED ORDER — PIOGLITAZONE HCL 30 MG PO TABS
30.0000 mg | ORAL_TABLET | Freq: Every day | ORAL | 1 refills | Status: DC
  Filled 2021-10-22: qty 90, 90d supply, fill #0

## 2021-10-22 MED ORDER — METFORMIN HCL 1000 MG PO TABS
1000.0000 mg | ORAL_TABLET | Freq: Two times a day (BID) | ORAL | 1 refills | Status: DC
  Filled 2021-10-22: qty 180, 90d supply, fill #0

## 2021-10-22 MED ORDER — TRAZODONE HCL 100 MG PO TABS
100.0000 mg | ORAL_TABLET | Freq: Every evening | ORAL | 0 refills | Status: DC | PRN
  Filled 2021-10-22: qty 90, 90d supply, fill #0

## 2021-10-22 MED ORDER — SITAGLIPTIN PHOSPHATE 100 MG PO TABS
100.0000 mg | ORAL_TABLET | Freq: Every day | ORAL | 1 refills | Status: DC
  Filled 2021-10-22: qty 90, 90d supply, fill #0

## 2021-10-22 MED ORDER — BISOPROLOL-HYDROCHLOROTHIAZIDE 2.5-6.25 MG PO TABS
1.0000 | ORAL_TABLET | Freq: Every day | ORAL | 1 refills | Status: DC
  Filled 2021-10-22: qty 90, 90d supply, fill #0

## 2021-10-22 MED ORDER — ESCITALOPRAM OXALATE 20 MG PO TABS
20.0000 mg | ORAL_TABLET | Freq: Every day | ORAL | 1 refills | Status: DC
  Filled 2021-10-22: qty 90, 90d supply, fill #0

## 2021-10-22 MED ORDER — EZETIMIBE 10 MG PO TABS
10.0000 mg | ORAL_TABLET | Freq: Every day | ORAL | 1 refills | Status: DC
  Filled 2021-10-22: qty 90, 90d supply, fill #0

## 2021-10-22 NOTE — Telephone Encounter (Signed)
Rx's sent. Oxycodone not available for refill until 10/28/21 ?

## 2021-10-22 NOTE — Telephone Encounter (Signed)
Pt needs all meds prescribed by Larose Kells needs to be refilled except Rosuvastatin  ? ?Please advise  ?[facility]  ?[address], [place], [place] [address]  ?Phone:  [phone]  Fax:  [phone] se  ?

## 2021-10-23 ENCOUNTER — Telehealth (INDEPENDENT_AMBULATORY_CARE_PROVIDER_SITE_OTHER): Payer: BC Managed Care – PPO | Admitting: Internal Medicine

## 2021-10-23 ENCOUNTER — Encounter: Payer: Self-pay | Admitting: Internal Medicine

## 2021-10-23 ENCOUNTER — Other Ambulatory Visit (HOSPITAL_BASED_OUTPATIENT_CLINIC_OR_DEPARTMENT_OTHER): Payer: Self-pay

## 2021-10-23 VITALS — Ht 67.0 in | Wt 169.4 lb

## 2021-10-23 DIAGNOSIS — K5900 Constipation, unspecified: Secondary | ICD-10-CM | POA: Diagnosis not present

## 2021-10-23 NOTE — Assessment & Plan Note (Signed)
Constipation: ?Constipation going on for 6 days, she does have a background of mild constipation.  Takes oxycodone regularly.  Last TSH normal.  No red flag symptoms for SBO. ?Plan: MiraLAX daily until she has a BM, glycerin suppositories as needed.  If not better, we could consider lactulose. ?Seek medical attention if SBO symptoms. ?In the long-term recommend high-fiber diet. ?Patient verbalized understanding of all of the above ?  ?

## 2021-10-23 NOTE — Progress Notes (Signed)
? ?Subjective:  ? ? Patient ID: Alexandra Parsons, female    DOB: 03-15-1961, 61 y.o.   MRN: [id number] ? ?DOS:  10/23/2021 ?Type of visit - description: Virtual Visit via Video Note ? ?I connected with the above patient  by a video enabled telemedicine application and verified that I am speaking with the correct person using two identifiers. ?  ?THIS ENCOUNTER IS A VIRTUAL VISIT DUE TO COVID-19 - PATIENT WAS NOT SEEN IN THE OFFICE. PATIENT HAS CONSENTED TO VIRTUAL VISIT / TELEMEDICINE VISIT ?  ?Location of patient: home ? ?Location of provider: office ? ?Persons participating in the virtual visit: patient, provider  ? ?I discussed the limitations of evaluation and management by telemedicine and the availability of in person appointments. The patient expressed understanding and agreed to proceed. ? ?Acute visit ?Her main concern is constipation.  Last BM was 6 days ago. ?The patient has on and off constipation, typically last 2 to 3 days, this time it has been 6 days. ?She takes oxycodone regularly and has not increased the dose. ? ?Denies fever chills ?Perhaps some nausea but no vomiting. ?No abdominal pain. ?No blood in the stools. ?Taking Colace regularly. ? ?  ?Review of Systems ?See above  ? ?Past Medical History:  ?Diagnosis Date  ? Abnormal Pap smear of cervix   ? h/o HPV  ? Chronic sinusitis, chronic cough (?COPD) 07/16/2009  ? Depression   ? Diabetes mellitus   ? type 2  ? Dyslipidemia   ? Elevated LFTs 11/19/2011  ? Hypertension   ? Leukocytosis 11/19/2011  ? Menopausal syndrome   ? Neck pain 05/20/2011  ? 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 Submandibular gland swelling   ? Right  ? ? ?Past Surgical History:  ?Procedure Laterality Date  ? abdominal plasty remotely    ? NASAL SEPTUM SURGERY  12/26/2015  ? pilondial cyst removal    ? TUBAL LIGATION    ? TURBINATE REDUCTION Bilateral 12/26/2015  ? ? ?Current Outpatient Medications  ?Medication Instructions  ? aspirin EC 81 mg, Oral, Daily  ?  bisoprolol-hydrochlorothiazide (ZIAC) 2.5-6.25 MG tablet 1 tablet, Oral, Daily  ? docusate sodium (COLACE) 250 mg, Oral, Daily PRN  ? escitalopram (LEXAPRO) 20 mg, Oral, Daily  ? ezetimibe (ZETIA) 10 mg, Oral, Daily  ? metFORMIN (GLUCOPHAGE) 1,000 mg, Oral, 2 times daily with meals  ? oxyCODONE-acetaminophen (PERCOCET/ROXICET) 5-325 MG tablet 1 tablet, Oral, Every 6 hours PRN  ? pioglitazone (ACTOS) 30 mg, Oral, Daily  ? rosuvastatin (CRESTOR) 10 mg, Oral, Every M-W-F  ? sitaGLIPtin (JANUVIA) 100 mg, Oral, Daily  ? traZODone (DESYREL) 100 mg, Oral, At bedtime PRN  ? ? ?   ?Objective:  ? Physical Exam ?Ht 5\' 7"  (1.702 m)   Wt 169 lb 6 oz (76.8 kg)   BMI 26.53 kg/m?  ?This is a virtual video visit, she looks alert oriented x3, in no distress ?   ?Assessment   ? ? Assessment ?DM ?HTN ?Dyslipidemia: intolerant to zocor ~ 2014 and  Lipitor 04/2018 ?Vit d def   ?CT 09-2018 ?---COPD ?---Pulmonary nodules, declined further CTs on 05-2019 ?--- (+)  Coronary calcium   ?Smoker ~1  ppd ?Psych: ?--Depression anxiety ?--ADHD ?MSK:  ?Fibromyalgia  (see OV 01/07/2021, 05/09/2021) ?Neck pain, chronic, on pain meds prn, UDS 09-2013 risk; intolerant to vicodin, oxycodone ok ?GYN: ?--BTL, Menopausal , h/o  Abnormal Pap smear, HPV ?Elevated LFTs: Hepatitis serology (-) 2008 ?H/o Sarcoid postpartum in remission ?H/o Chronic  cough: Chronic sinusitis? COPD? ?H/o Leukocytosis  ?H/o +  PPD ?  ?PLAN ?Constipation: ?Constipation going on for 6 days, she does have a background of mild constipation.  Takes oxycodone regularly.  Last TSH normal.  No red flag symptoms for SBO. ?Plan: MiraLAX daily until she has a BM, glycerin suppositories as needed.  If not better, we could consider lactulose. ?Seek medical attention if SBO symptoms. ?In the long-term recommend high-fiber diet. ?Patient verbalized understanding of all of the above ?  ?  ?I discussed the assessment and treatment plan with the patient. The patient was provided an opportunity to ask  questions and all were answered. The patient agreed with the plan and demonstrated an understanding of the instructions. ?  ?The patient was advised to call back or seek an in-person evaluation if the symptoms worsen or if the condition fails to improve as anticipated. ? ?  ?

## 2021-10-27 ENCOUNTER — Telehealth: Payer: Self-pay | Admitting: Internal Medicine

## 2021-10-27 NOTE — Telephone Encounter (Signed)
Medication:  ?[id number]  ?oxyCODONE-acetaminophen (PERCOCET/ROXICET) 5-325 MG tablet [[id number]]  ?  ? ?Has the patient contacted their pharmacy? No. ?(If no, request that the patient contact the pharmacy for the refill.) ?(If yes, when and what did the pharmacy advise?) ? ?  ? ?Preferred Pharmacy (with phone number or street name):  ?[facility]  ?[address], [place] [address]  ?Phone:  [phone]  Fax:  [phone] ?  ? ?Agent: Please be advised that RX refills may take up to 3 business days. We ask that you follow-up with your pharmacy.  ?

## 2021-10-28 ENCOUNTER — Other Ambulatory Visit (HOSPITAL_BASED_OUTPATIENT_CLINIC_OR_DEPARTMENT_OTHER): Payer: Self-pay

## 2021-10-28 MED ORDER — OXYCODONE-ACETAMINOPHEN 5-325 MG PO TABS
1.0000 | ORAL_TABLET | Freq: Four times a day (QID) | ORAL | 0 refills | Status: DC | PRN
  Filled 2021-10-28: qty 100, 25d supply, fill #0

## 2021-10-28 NOTE — Telephone Encounter (Signed)
Requesting: oxycodone 5-325mg   ?Contract: 09/16/2020 ?UDS: 10/16/2021 ?Last Visit: 10/23/2021 ?Next Visit: 01/19/2022 ?Last Refill: 09/30/2021 #100 and 0RF ? ?Please Advise ? ?

## 2021-10-28 NOTE — Telephone Encounter (Signed)
PDMP okay, Rx sent 

## 2021-10-30 ENCOUNTER — Other Ambulatory Visit (HOSPITAL_BASED_OUTPATIENT_CLINIC_OR_DEPARTMENT_OTHER): Payer: Self-pay

## 2021-12-05 ENCOUNTER — Other Ambulatory Visit (HOSPITAL_BASED_OUTPATIENT_CLINIC_OR_DEPARTMENT_OTHER): Payer: Self-pay

## 2021-12-05 ENCOUNTER — Telehealth: Payer: Self-pay | Admitting: Internal Medicine

## 2021-12-05 MED ORDER — OXYCODONE-ACETAMINOPHEN 5-325 MG PO TABS
1.0000 | ORAL_TABLET | Freq: Four times a day (QID) | ORAL | 0 refills | Status: DC | PRN
  Filled 2021-12-05: qty 100, 25d supply, fill #0

## 2021-12-05 NOTE — Telephone Encounter (Signed)
Requesting: oxycodone 5-325mg  ?Contract: 09/16/20 ?UDS: 10/16/21 ?Last Visit: 10/16/21 ?Next Visit: None ?Last Refill: 10/28/2021 #100 and 0RF ? ?Please Advise ? ?

## 2021-12-05 NOTE — Telephone Encounter (Signed)
Medication: oxyCODONE-acetaminophen (PERCOCET/ROXICET) 5-325 MG tablet  ? ?Has the patient contacted their pharmacy? No. ? ?Preferred Pharmacy (with phone number or street name):  ?[facility]  ?[address], [place] [address]  ?Phone:  [phone]  Fax:  [phone] ? ?Agent: Please be advised that RX refills may take up to 3 business days. We ask that you follow-up with your pharmacy.  ?

## 2021-12-05 NOTE — Telephone Encounter (Signed)
PDMP checked, Rx sent ?

## 2021-12-07 ENCOUNTER — Emergency Department (HOSPITAL_BASED_OUTPATIENT_CLINIC_OR_DEPARTMENT_OTHER): Payer: BC Managed Care – PPO

## 2021-12-07 ENCOUNTER — Inpatient Hospital Stay (HOSPITAL_BASED_OUTPATIENT_CLINIC_OR_DEPARTMENT_OTHER)
Admission: EM | Admit: 2021-12-07 | Discharge: 2021-12-12 | DRG: 511 | Disposition: A | Discharge status: Rehabilitation Facility | Payer: BC Managed Care – PPO | Attending: Internal Medicine | Admitting: Internal Medicine

## 2021-12-07 ENCOUNTER — Other Ambulatory Visit: Payer: Self-pay

## 2021-12-07 ENCOUNTER — Encounter (HOSPITAL_BASED_OUTPATIENT_CLINIC_OR_DEPARTMENT_OTHER): Payer: Self-pay | Admitting: Emergency Medicine

## 2021-12-07 DIAGNOSIS — M1712 Unilateral primary osteoarthritis, left knee: Secondary | ICD-10-CM | POA: Diagnosis present

## 2021-12-07 DIAGNOSIS — Z8249 Family history of ischemic heart disease and other diseases of the circulatory system: Secondary | ICD-10-CM

## 2021-12-07 DIAGNOSIS — I10 Essential (primary) hypertension: Secondary | ICD-10-CM | POA: Diagnosis present

## 2021-12-07 DIAGNOSIS — S52021A Displaced fracture of olecranon process without intraarticular extension of right ulna, initial encounter for closed fracture: Principal | ICD-10-CM | POA: Diagnosis present

## 2021-12-07 DIAGNOSIS — E8809 Other disorders of plasma-protein metabolism, not elsewhere classified: Secondary | ICD-10-CM | POA: Diagnosis not present

## 2021-12-07 DIAGNOSIS — E119 Type 2 diabetes mellitus without complications: Secondary | ICD-10-CM

## 2021-12-07 DIAGNOSIS — G8929 Other chronic pain: Secondary | ICD-10-CM | POA: Diagnosis present

## 2021-12-07 DIAGNOSIS — R296 Repeated falls: Secondary | ICD-10-CM

## 2021-12-07 DIAGNOSIS — Z881 Allergy status to other antibiotic agents status: Secondary | ICD-10-CM

## 2021-12-07 DIAGNOSIS — S52125A Nondisplaced fracture of head of left radius, initial encounter for closed fracture: Secondary | ICD-10-CM | POA: Diagnosis present

## 2021-12-07 DIAGNOSIS — Y990 Civilian activity done for income or pay: Secondary | ICD-10-CM

## 2021-12-07 DIAGNOSIS — W19XXXA Unspecified fall, initial encounter: Principal | ICD-10-CM

## 2021-12-07 DIAGNOSIS — F17210 Nicotine dependence, cigarettes, uncomplicated: Secondary | ICD-10-CM | POA: Diagnosis present

## 2021-12-07 DIAGNOSIS — W010XXA Fall on same level from slipping, tripping and stumbling without subsequent striking against object, initial encounter: Secondary | ICD-10-CM | POA: Diagnosis present

## 2021-12-07 DIAGNOSIS — F419 Anxiety disorder, unspecified: Secondary | ICD-10-CM | POA: Diagnosis present

## 2021-12-07 DIAGNOSIS — Z7982 Long term (current) use of aspirin: Secondary | ICD-10-CM

## 2021-12-07 DIAGNOSIS — E7800 Pure hypercholesterolemia, unspecified: Secondary | ICD-10-CM | POA: Diagnosis present

## 2021-12-07 DIAGNOSIS — K5900 Constipation, unspecified: Secondary | ICD-10-CM | POA: Diagnosis not present

## 2021-12-07 DIAGNOSIS — Z66 Do not resuscitate: Secondary | ICD-10-CM | POA: Diagnosis present

## 2021-12-07 DIAGNOSIS — Z800 Family history of malignant neoplasm of digestive organs: Secondary | ICD-10-CM

## 2021-12-07 DIAGNOSIS — Z720 Tobacco use: Secondary | ICD-10-CM | POA: Diagnosis present

## 2021-12-07 DIAGNOSIS — M25462 Effusion, left knee: Secondary | ICD-10-CM | POA: Diagnosis present

## 2021-12-07 DIAGNOSIS — Z888 Allergy status to other drugs, medicaments and biological substances status: Secondary | ICD-10-CM

## 2021-12-07 DIAGNOSIS — Z79899 Other long term (current) drug therapy: Secondary | ICD-10-CM

## 2021-12-07 DIAGNOSIS — Z833 Family history of diabetes mellitus: Secondary | ICD-10-CM

## 2021-12-07 DIAGNOSIS — S52122A Displaced fracture of head of left radius, initial encounter for closed fracture: Secondary | ICD-10-CM | POA: Diagnosis present

## 2021-12-07 DIAGNOSIS — G894 Chronic pain syndrome: Secondary | ICD-10-CM | POA: Diagnosis present

## 2021-12-07 DIAGNOSIS — Y92009 Unspecified place in unspecified non-institutional (private) residence as the place of occurrence of the external cause: Secondary | ICD-10-CM

## 2021-12-07 DIAGNOSIS — M25062 Hemarthrosis, left knee: Secondary | ICD-10-CM | POA: Diagnosis present

## 2021-12-07 DIAGNOSIS — Z7984 Long term (current) use of oral hypoglycemic drugs: Secondary | ICD-10-CM

## 2021-12-07 DIAGNOSIS — M797 Fibromyalgia: Secondary | ICD-10-CM | POA: Diagnosis present

## 2021-12-07 DIAGNOSIS — M2352 Chronic instability of knee, left knee: Secondary | ICD-10-CM

## 2021-12-07 DIAGNOSIS — Y92149 Unspecified place in prison as the place of occurrence of the external cause: Secondary | ICD-10-CM

## 2021-12-07 DIAGNOSIS — S52209A Unspecified fracture of shaft of unspecified ulna, initial encounter for closed fracture: Secondary | ICD-10-CM | POA: Diagnosis present

## 2021-12-07 DIAGNOSIS — S42409A Unspecified fracture of lower end of unspecified humerus, initial encounter for closed fracture: Secondary | ICD-10-CM | POA: Diagnosis not present

## 2021-12-07 DIAGNOSIS — S82145A Nondisplaced bicondylar fracture of left tibia, initial encounter for closed fracture: Secondary | ICD-10-CM | POA: Diagnosis present

## 2021-12-07 DIAGNOSIS — J449 Chronic obstructive pulmonary disease, unspecified: Secondary | ICD-10-CM | POA: Diagnosis present

## 2021-12-07 DIAGNOSIS — F32A Depression, unspecified: Secondary | ICD-10-CM | POA: Diagnosis present

## 2021-12-07 DIAGNOSIS — M25362 Other instability, left knee: Secondary | ICD-10-CM | POA: Diagnosis present

## 2021-12-07 DIAGNOSIS — D62 Acute posthemorrhagic anemia: Secondary | ICD-10-CM | POA: Diagnosis not present

## 2021-12-07 HISTORY — DX: Chronic pain syndrome: G89.4

## 2021-12-07 LAB — CBC WITH DIFFERENTIAL/PLATELET
Abs Immature Granulocytes: 0.04 10*3/uL (ref 0.00–0.07)
Basophils Absolute: 0 10*3/uL (ref 0.0–0.1)
Basophils Relative: 0 %
Eosinophils Absolute: 0 10*3/uL (ref 0.0–0.5)
Eosinophils Relative: 0 %
HCT: 36.4 % (ref 36.0–46.0)
Hemoglobin: 12.2 g/dL (ref 12.0–15.0)
Immature Granulocytes: 0 %
Lymphocytes Relative: 19 %
Lymphs Abs: 2 10*3/uL (ref 0.7–4.0)
MCH: 31.8 pg (ref 26.0–34.0)
MCHC: 33.5 g/dL (ref 30.0–36.0)
MCV: 94.8 fL (ref 80.0–100.0)
Monocytes Absolute: 0.7 10*3/uL (ref 0.1–1.0)
Monocytes Relative: 7 %
Neutro Abs: 7.8 10*3/uL — ABNORMAL HIGH (ref 1.7–7.7)
Neutrophils Relative %: 74 %
Platelets: 174 10*3/uL (ref 150–400)
RBC: 3.84 MIL/uL — ABNORMAL LOW (ref 3.87–5.11)
RDW: 14.2 % (ref 11.5–15.5)
WBC: 10.7 10*3/uL — ABNORMAL HIGH (ref 4.0–10.5)
nRBC: 0 % (ref 0.0–0.2)

## 2021-12-07 LAB — MAGNESIUM: Magnesium: 1.8 mg/dL (ref 1.7–2.4)

## 2021-12-07 LAB — BASIC METABOLIC PANEL
Anion gap: 6 (ref 5–15)
BUN: 14 mg/dL (ref 8–23)
CO2: 28 mmol/L (ref 22–32)
Calcium: 10 mg/dL (ref 8.9–10.3)
Chloride: 103 mmol/L (ref 98–111)
Creatinine, Ser: 0.69 mg/dL (ref 0.44–1.00)
GFR, Estimated: >60 mL/min (ref >60)
Glucose, Bld: 126 mg/dL — ABNORMAL HIGH (ref 70–99)
Potassium: 4.3 mmol/L (ref 3.5–5.1)
Sodium: 137 mmol/L (ref 135–145)

## 2021-12-07 MED ORDER — LACTATED RINGERS IV SOLN: INTRAVENOUS | Status: DC

## 2021-12-07 MED ORDER — OXYCODONE-ACETAMINOPHEN 5-325 MG PO TABS
2.0000 | ORAL_TABLET | Freq: Once | ORAL | Status: AC
  Administered 2021-12-07: 2 via ORAL
  Filled 2021-12-07: qty 2

## 2021-12-07 MED ORDER — NICOTINE 21 MG/24HR TD PT24
21.0000 mg | MEDICATED_PATCH | Freq: Once | TRANSDERMAL | Status: DC
  Administered 2021-12-07: 21 mg via TRANSDERMAL
  Filled 2021-12-07: qty 1

## 2021-12-07 MED ORDER — OXYCODONE HCL 5 MG PO TABS
5.0000 mg | ORAL_TABLET | ORAL | 0 refills | Status: DC | PRN
  Filled 2021-12-07: qty 24, 4d supply, fill #0

## 2021-12-07 MED ORDER — OXYCODONE HCL 5 MG PO TABS
10.0000 mg | ORAL_TABLET | Freq: Once | ORAL | Status: AC
  Administered 2021-12-07: 10 mg via ORAL
  Filled 2021-12-07: qty 2

## 2021-12-07 MED ORDER — KETOROLAC TROMETHAMINE 15 MG/ML IJ SOLN
15.0000 mg | Freq: Once | INTRAMUSCULAR | Status: AC
  Administered 2021-12-08: 15 mg via INTRAVENOUS
  Filled 2021-12-07: qty 1

## 2021-12-07 NOTE — Discharge Instructions (Addendum)
? ? ?   Discharge Instructions  ? ? ?Attending Surgeon: Huel Cote, MD ?Office Phone Number: [phone] ? ? ?Diagnosis and Procedures:   ? ?Surgeries Performed: ?Right olecranon open reduction internal fixation, left tibial plateau fracture ? ?Discharge Plan:  ? ?Activity:  ?You are non weight bearing on your right arm and left leg, you may use your left arm for ADLs and mobility ? ? ? ?There is a number below to call in order to follow-up with orthopedic surgery.  Call them tomorrow morning to schedule an appointment for Wednesday or Thursday.  Utilize slings for comfort.  Place weight on left leg, as tolerated, within the limits of your pain.  You can also utilize your left arm, also within limits of your pain.  Return to the emergency department for any new concerning symptoms. ?

## 2021-12-07 NOTE — ED Provider Notes (Signed)
[facility] EMERGENCY DEPT Provider Note   CSN: [id number] Arrival date & time: 12/07/21  1557     History  Chief Complaint  Patient presents with   Alexandra Parsons is a 61 y.o. female.   Fall Patient presents after a fall.  Her medical history includes DM, HLD, anxiety, depression, HTN, COPD, fibromyalgia.  Fall occurred at 1:45 PM.  At the time, she was at work.  She was walking across a flat concrete surface when her shoe caught on the ground causing her to fall forward.  At the time, she was carrying objects in her hands.  She fell, striking both knees and both elbows.  She has since had significant pain in bilateral elbows and left knee.  She has had associated swelling.  She denies any other areas of pain.  She denies striking her head.  Patient currently has chronic pain that is managed with Dr. Darrin Nipper at [facility].  She is prescribed oxycodone.  She took her morning dose, before the injuries.  She has not taken anything for analgesia since the injuries.  She is on aspirin but not any other blood thinners.     Home Medications Prior to Admission medications   Medication Sig Start Date End Date Taking? Authorizing Provider  oxyCODONE (ROXICODONE) 5 MG immediate release tablet Take 1 tablet (5 mg total) by mouth every 4 (four) hours as needed for up to 4 days for severe pain. 12/07/21 12/12/21 Yes Gloris Manchester, MD  aspirin EC 81 MG tablet Take 81 mg by mouth daily.    [provider]  bisoprolol-hydrochlorothiazide (ZIAC) 2.5-6.25 MG tablet Take 1 tablet by mouth daily. 10/22/21   Wanda Plump, MD  docusate sodium (COLACE) 250 MG capsule Take 250 mg by mouth daily as needed for constipation.    [provider]  escitalopram (LEXAPRO) 20 MG tablet Take 1 tablet (20 mg total) by mouth daily. 10/22/21   Wanda Plump, MD  ezetimibe (ZETIA) 10 MG tablet Take 1 tablet (10 mg total) by mouth daily. 10/22/21   Wanda Plump, MD  metFORMIN  (GLUCOPHAGE) 1000 MG tablet Take 1 tablet (1,000 mg total) by mouth 2 (two) times daily with a meal. 10/22/21   Wanda Plump, MD  oxyCODONE-acetaminophen (PERCOCET/ROXICET) 5-325 MG tablet Take 1 tablet by mouth every 6 (six) hours as needed. 12/05/21   Wanda Plump, MD  pioglitazone (ACTOS) 30 MG tablet Take 1 tablet (30 mg total) by mouth daily. 10/22/21   Wanda Plump, MD  rosuvastatin (CRESTOR) 10 MG tablet Take 1 tablet (10 mg total) by mouth every Monday, Wednesday, and Friday. 10/17/21   Wanda Plump, MD  sitaGLIPtin (JANUVIA) 100 MG tablet Take 1 tablet (100 mg total) by mouth daily. 10/22/21   Wanda Plump, MD  traZODone (DESYREL) 100 MG tablet Take 1 tablet (100 mg total) by mouth at bedtime as needed for sleep. 10/22/21   Wanda Plump, MD      Allergies    Erythromycin, Fluoxetine hcl, and Venlafaxine    Review of Systems   Review of Systems  Musculoskeletal:  Positive for arthralgias and myalgias.  All other systems reviewed and are negative.  Physical Exam Updated Vital Signs BP 120/68 (BP Location: Left Arm)   Pulse 66   Temp 99 F (37.2 C) (Oral)   Resp 17   Wt 77.6 kg   SpO2 98%   BMI 26.78 kg/m  Physical  Exam Constitutional:      General: She is not in acute distress.    Appearance: Normal appearance. She is normal weight. She is not ill-appearing, toxic-appearing or diaphoretic.  HENT:     Head: Normocephalic and atraumatic.     Right Ear: External ear normal.     Left Ear: External ear normal.     Nose: Nose normal.     Mouth/Throat:     Mouth: Mucous membranes are moist.     Pharynx: Oropharynx is clear.  Eyes:     General: No scleral icterus.    Extraocular Movements: Extraocular movements intact.  Cardiovascular:     Rate and Rhythm: Normal rate and regular rhythm.  Pulmonary:     Effort: Pulmonary effort is normal. No respiratory distress.     Breath sounds: Normal breath sounds. No wheezing.  Chest:     Chest wall: No tenderness.  Abdominal:      Palpations: Abdomen is soft.     Tenderness: There is no abdominal tenderness.  Musculoskeletal:        General: Swelling, tenderness and signs of injury present.     Cervical back: Normal range of motion. No rigidity or tenderness.     Right lower leg: No edema.     Left lower leg: No edema.     Comments: Tenderness without deformity to left elbow; tenderness with significant posterior swelling to right elbow, swelling and tenderness to left knee.  Right knee appears atraumatic.  Range of motion of right elbow and left knee limited by pain.  Skin overlying areas of joint injuries is intact.  Skin:    General: Skin is warm and dry.     Coloration: Skin is not jaundiced or pale.  Neurological:     General: No focal deficit present.     Mental Status: She is alert and oriented to person, place, and time.     Cranial Nerves: No cranial nerve deficit.     Sensory: No sensory deficit.     Motor: No weakness.     Coordination: Coordination normal.  Psychiatric:        Mood and Affect: Mood normal.        Behavior: Behavior normal.    ED Results / Procedures / Treatments   Labs (all labs ordered are listed, but only abnormal results are displayed) Labs Reviewed  CBC WITH DIFFERENTIAL/PLATELET - Abnormal; Notable for the following components:      Result Value   WBC 10.7 (*)    RBC 3.84 (*)    Neutro Abs 7.8 (*)    All other components within normal limits  BASIC METABOLIC PANEL - Abnormal; Notable for the following components:   Glucose, Bld 126 (*)    All other components within normal limits  MAGNESIUM  HIV ANTIBODY (ROUTINE TESTING W REFLEX)  VITAMIN D 25 HYDROXY (VIT D DEFICIENCY, FRACTURES)    EKG None  Radiology DG Elbow Complete Left  Result Date: 12/07/2021 CLINICAL DATA:  Elbow pain post fall EXAM: LEFT ELBOW - COMPLETE 3+ VIEW COMPARISON:  None FINDINGS: Osseous demineralization. Moderate-sized joint effusion. Nondisplaced intra-articular radial head fracture. No  additional fracture, dislocation, or bone destruction. IMPRESSION: Nondisplaced intra-articular fracture of the LEFT radial head with associated moderate elbow joint effusion. Electronically Signed   By: Ulyses Southward M.D.   On: 12/07/2021 18:25   DG Elbow Complete Right  Result Date: 12/07/2021 CLINICAL DATA:  Trauma, fall EXAM: RIGHT ELBOW - COMPLETE 3+ VIEW COMPARISON:  None. FINDINGS: Comminuted fracture is seen in the proximal ulna mostly involving the olecranon process. There is 3-5 mm distraction of fracture fragments. Fracture lines are extending to the articular surface. Small calcific density is seen adjacent to the tip of coronoid process of proximal ulna suggesting recent or old avulsion. There is marked soft tissue swelling along the posterior aspect of right elbow. This may suggest hematoma. There are no opaque foreign bodies. IMPRESSION: Severely comminuted, slightly displaced fracture is seen in the proximal right ulna. Electronically Signed   By: Ernie Avena M.D.   On: 12/07/2021 18:25   DG Knee Complete 4 Views Left  Result Date: 12/07/2021 CLINICAL DATA:  Trauma, fall EXAM: LEFT KNEE - COMPLETE 4 VIEW COMPARISON:  None. FINDINGS: No displaced fracture or dislocation is seen. There is soft tissue fullness in the suprapatellar bursa. There are no opaque foreign bodies. IMPRESSION: No recent fracture or dislocation is seen in the left knee. Moderate to large effusion, possibly hemarthrosis in the suprapatellar bursa. Electronically Signed   By: Ernie Avena M.D.   On: 12/07/2021 18:26    Procedures Procedures    Medications Ordered in ED Medications  lactated ringers infusion ( Intravenous New Bag/Given 12/08/21 1440)  nicotine (NICODERM CQ - dosed in mg/24 hours) patch 21 mg (21 mg Transdermal Patch Applied 12/08/21 1436)  aspirin EC tablet 81 mg (has no administration in time range)  bisoprolol-hydrochlorothiazide (ZIAC) 2.5-6.25 MG per tablet 1 tablet (has no  administration in time range)  ezetimibe (ZETIA) tablet 10 mg (has no administration in time range)  rosuvastatin (CRESTOR) tablet 10 mg (10 mg Oral Given 12/08/21 1436)  escitalopram (LEXAPRO) tablet 20 mg (has no administration in time range)  traZODone (DESYREL) tablet 100 mg (has no administration in time range)  morphine (PF) 2 MG/ML injection 2 mg (has no administration in time range)  methocarbamol (ROBAXIN) tablet 500 mg (500 mg Oral Given 12/08/21 1436)    Or  methocarbamol (ROBAXIN) 500 mg in dextrose 5 % 50 mL IVPB ( Intravenous See Alternative 12/08/21 1436)  docusate sodium (COLACE) capsule 100 mg (100 mg Oral Given 12/08/21 1436)  polyethylene glycol (MIRALAX / GLYCOLAX) packet 17 g (has no administration in time range)  bisacodyl (DULCOLAX) EC tablet 5 mg (has no administration in time range)  insulin aspart (novoLOG) injection 0-15 Units (has no administration in time range)  oxyCODONE (Oxy IR/ROXICODONE) immediate release tablet 5-10 mg (10 mg Oral Given 12/08/21 1436)  ondansetron (ZOFRAN) injection 4 mg (has no administration in time range)  insulin aspart (novoLOG) injection 0-5 Units (has no administration in time range)  oxyCODONE-acetaminophen (PERCOCET/ROXICET) 5-325 MG per tablet 2 tablet (2 tablets Oral Given 12/07/21 1843)  oxyCODONE (Oxy IR/ROXICODONE) immediate release tablet 10 mg (10 mg Oral Given 12/07/21 2345)  ketorolac (TORADOL) 15 MG/ML injection 15 mg (15 mg Intravenous Given 12/08/21 0007)  oxyCODONE (Oxy IR/ROXICODONE) immediate release tablet 10 mg (10 mg Oral Given 12/08/21 0356)  morphine (PF) 4 MG/ML injection 4 mg (4 mg Intravenous Given 12/08/21 1000)    ED Course/ Medical Decision Making/ A&P                           Medical Decision Making Amount and/or Complexity of Data Reviewed Labs: ordered. Radiology: ordered.  Risk Prescription drug management. Decision regarding hospitalization.   This patient presents to the ED for concern of fall, this  involves an extensive number of treatment options, and is a  complaint that carries with it a high risk of complications and morbidity.  The differential diagnosis includes acute injuries   Co morbidities that complicate the patient evaluation  DM, HLD, anxiety, depression, HTN, tobacco abuse, chronic pain, COPD, fibromyalgia   Additional history obtained:  Additional history obtained from patient's daughter External records from outside source obtained and reviewed including EMR   Lab Tests:  I Ordered, and personally interpreted labs.  The pertinent results include: Mild leukocytosis consistent with traumatic injury, otherwise normal findings   Imaging Studies ordered:  I ordered imaging studies including imaging of left knee, bilateral elbows I independently visualized and interpreted imaging which showed comminuted and slightly displaced fracture of proximal right ulna; nondisplaced intra-articular fracture of left radial head; no fracture or dislocation identified in left knee I agree with the radiologist interpretation   Cardiac Monitoring: / EKG:  The patient was maintained on a cardiac monitor.  I personally viewed and interpreted the cardiac monitored which showed an underlying rhythm of: Sinus rhythm   Consultations Obtained:  I requested consultation with the orthopedic surgeon, Dr. Roda Shutters,  and discussed lab and imaging findings as well as pertinent plan - they recommend: Long-arm splint of right arm, slings for comfort, follow-up in 3 days.   Problem List / ED Course / Critical interventions / Medication management  Patient is a 61 year old female who suffered a mechanical fall earlier this afternoon, around midday.  During this fall, she struck her bilateral knees and bilateral elbows on a concrete surface.  She has since had significant pain in areas of bilateral elbows and left knee.  She has swelling to right elbow and left knee.  She has been able to bear weight on  her left leg, although this is painful to her.  She denies striking her head.  She is not on a blood thinner.  On arrival in the ED, vital signs are normal.  On assessment, patient is awake and alert.  There is no evidence of head trauma.  She has no midline spinal tenderness.  Breathing is unlabored.  She does have findings of significant swelling to right elbow and left knee.  Range of motion of these joints is limited by pain.  Overlying skin is intact.  She also has significant pain and tenderness to her left elbow.  RLE appears atraumatic with no evidence of tenderness or deformity.  She does have chronic pain and this is managed by her primary care doctor.  10 mg Percocet was given for analgesia here in the ED.  Prior to being bedded in the ED, she was able to undergo x-ray imaging of areas of suspected injuries.  Patient was found to have a radial head fracture on her left elbow and a proximal ulna fracture of her right elbow.  Although knee x-ray of left leg was negative, patient has clinical signs of knee injury.  She was placed in a left knee immobilizer.  I discussed x-ray imaging findings with orthopedic surgeon on-call, Dr. Roda Shutters.  He recommended a long-arm splint of her right arm, sling for comfort, and close follow-up.  I discussed the x-ray findings and need for close follow-up with the patient.  Patient states that she currently lives alone.  She does have family emergency live close by.  Initially, she felt that she would be able to get by at home with tasks of daily living.  Following splinting and placement of sling, patient was discharged.  As she was leaving the ED, she realized  that she would not be able to function at home given the continued pain and inability to utilize 3/4 of her limbs.  She is brought back into the ED.  She was given additional medicine for analgesia.  I discussed the patient with hospitalist, Dr. Imogene Burn.  I also attempted to reengage orthopedic doctor.  Following additional  analgesia, patient continued to have uncontrolled pain.  Patient was admitted for pain control and PT evaluation. I ordered medication including Percocet and Roxicodone for analgesia Reevaluation of the patient after these medicines showed that the patient improved I have reviewed the patients home medicines and have made adjustments as needed   Social Determinants of Health:  Chronic opioid use         Final Clinical Impression(s) / ED Diagnoses Final diagnoses:  Fall, initial encounter    Rx / DC Orders ED Discharge Orders          Ordered    oxyCODONE (ROXICODONE) 5 MG immediate release tablet  Every 4 hours PRN        12/07/21 2044              Gloris Manchester, MD 12/08/21 1500

## 2021-12-07 NOTE — ED Notes (Signed)
Pt daughter back into ED to request to speak to nurse. This nurse went to speak to daughter and she stated her mother was in the car outside and needed to speak to me. Pt sitting in front seat of friends vehicle and stated that she doesn't know what to do because she can't get into her house due to steps and is unable to go to the bathroom or feed self. This nurse came back and spoke to charge nurse and provider. Provider stated that pt told her she had a daughter, brother, mother and friend that would be taking care of her. Also fire department is available for lifting assistance and can get patient into the house when she gets home. However if she did not have the help at home that he would try to get her admitted. This nurse went back out to speak to patient. Advised patient that if she felt she could go home and just needed assistance to get in the house that she could call 911 and advise it is non emergent and request lifting assistance to get into house. Pt stated that once she gets into the house she will not have anyone to help her go to the bathroom or feed herself. She stated that her brother has to work, her daughter has school and her mother is elderly and not physically able to help. Pt made aware that she come back into emergency department and when a bed is available will be placed back into a room. The provider will reach out to see if he can get her admitted. Pt aware that the admission process can take a long time but when she gets into a room a purwick will be placed to assist her with her bathroom needs. Pt thought for a minute and said well maybe I will get go home and get the fire department to come help me in the house. Pt daughter and friend told pt they did not think this was a good plan because they could not help her once she got into the house. They also had concerns that if she was in the house and called someone to come help that she would not be able to get up and go to the door to let  anyone in the house. Pt tearful at this time and stated I just need help I know I can not do this by myself. Pt stated she would like to come back inside and wait and requested to have the same wheelchair that allowed her legs to be elevated. Attempted to assist pt back into the wheelchair when she asked me to stop and insisted that her daughter help her. Pt was wheeled back into the ED and provider aware that pt is requesting admission.  ?

## 2021-12-07 NOTE — ED Notes (Signed)
EDP at bedside  

## 2021-12-07 NOTE — ED Triage Notes (Signed)
Reports fall today , bilateral knee injury , bilateral arm injury , obvious edema and bruising to right elbow and left knee .  ?Denies head injury  nor loc . On baby aspirin daily .  ?

## 2021-12-07 NOTE — ED Notes (Signed)
Dc instructions and scripts reviewed with pt. Pt verbalized understanding that she needs to call ortho tomorrow and be seen in 2-3 days. Pt friend to ED to transport pt home. ED tech, pt daughter and pt friend assisted patient into vehicle.  ?

## 2021-12-08 ENCOUNTER — Encounter (HOSPITAL_COMMUNITY): Payer: Self-pay | Admitting: Internal Medicine

## 2021-12-08 ENCOUNTER — Other Ambulatory Visit (HOSPITAL_BASED_OUTPATIENT_CLINIC_OR_DEPARTMENT_OTHER): Payer: Self-pay

## 2021-12-08 DIAGNOSIS — Y92149 Unspecified place in prison as the place of occurrence of the external cause: Secondary | ICD-10-CM | POA: Diagnosis not present

## 2021-12-08 DIAGNOSIS — E8809 Other disorders of plasma-protein metabolism, not elsewhere classified: Secondary | ICD-10-CM | POA: Diagnosis not present

## 2021-12-08 DIAGNOSIS — Z8249 Family history of ischemic heart disease and other diseases of the circulatory system: Secondary | ICD-10-CM | POA: Diagnosis not present

## 2021-12-08 DIAGNOSIS — R300 Dysuria: Secondary | ICD-10-CM | POA: Diagnosis not present

## 2021-12-08 DIAGNOSIS — R296 Repeated falls: Secondary | ICD-10-CM

## 2021-12-08 DIAGNOSIS — K5900 Constipation, unspecified: Secondary | ICD-10-CM | POA: Diagnosis not present

## 2021-12-08 DIAGNOSIS — S82102D Unspecified fracture of upper end of left tibia, subsequent encounter for closed fracture with routine healing: Secondary | ICD-10-CM | POA: Diagnosis not present

## 2021-12-08 DIAGNOSIS — N390 Urinary tract infection, site not specified: Secondary | ICD-10-CM | POA: Diagnosis not present

## 2021-12-08 DIAGNOSIS — I10 Essential (primary) hypertension: Secondary | ICD-10-CM | POA: Diagnosis present

## 2021-12-08 DIAGNOSIS — Z66 Do not resuscitate: Secondary | ICD-10-CM | POA: Diagnosis not present

## 2021-12-08 DIAGNOSIS — R0789 Other chest pain: Secondary | ICD-10-CM | POA: Diagnosis not present

## 2021-12-08 DIAGNOSIS — S52125A Nondisplaced fracture of head of left radius, initial encounter for closed fracture: Secondary | ICD-10-CM | POA: Diagnosis present

## 2021-12-08 DIAGNOSIS — S52122A Displaced fracture of head of left radius, initial encounter for closed fracture: Secondary | ICD-10-CM | POA: Diagnosis not present

## 2021-12-08 DIAGNOSIS — F419 Anxiety disorder, unspecified: Secondary | ICD-10-CM

## 2021-12-08 DIAGNOSIS — S52031A Displaced fracture of olecranon process with intraarticular extension of right ulna, initial encounter for closed fracture: Secondary | ICD-10-CM | POA: Diagnosis not present

## 2021-12-08 DIAGNOSIS — R3915 Urgency of urination: Secondary | ICD-10-CM | POA: Diagnosis not present

## 2021-12-08 DIAGNOSIS — M25362 Other instability, left knee: Secondary | ICD-10-CM | POA: Diagnosis present

## 2021-12-08 DIAGNOSIS — M1712 Unilateral primary osteoarthritis, left knee: Secondary | ICD-10-CM | POA: Diagnosis present

## 2021-12-08 DIAGNOSIS — G894 Chronic pain syndrome: Secondary | ICD-10-CM | POA: Diagnosis present

## 2021-12-08 DIAGNOSIS — M25532 Pain in left wrist: Secondary | ICD-10-CM | POA: Diagnosis not present

## 2021-12-08 DIAGNOSIS — S52021A Displaced fracture of olecranon process without intraarticular extension of right ulna, initial encounter for closed fracture: Secondary | ICD-10-CM | POA: Diagnosis present

## 2021-12-08 DIAGNOSIS — T07XXXA Unspecified multiple injuries, initial encounter: Secondary | ICD-10-CM | POA: Diagnosis not present

## 2021-12-08 DIAGNOSIS — Y990 Civilian activity done for income or pay: Secondary | ICD-10-CM | POA: Diagnosis not present

## 2021-12-08 DIAGNOSIS — W19XXXA Unspecified fall, initial encounter: Secondary | ICD-10-CM

## 2021-12-08 DIAGNOSIS — F4323 Adjustment disorder with mixed anxiety and depressed mood: Secondary | ICD-10-CM | POA: Diagnosis not present

## 2021-12-08 DIAGNOSIS — D62 Acute posthemorrhagic anemia: Secondary | ICD-10-CM | POA: Diagnosis not present

## 2021-12-08 DIAGNOSIS — E669 Obesity, unspecified: Secondary | ICD-10-CM | POA: Diagnosis not present

## 2021-12-08 DIAGNOSIS — N3000 Acute cystitis without hematuria: Secondary | ICD-10-CM | POA: Diagnosis not present

## 2021-12-08 DIAGNOSIS — F17210 Nicotine dependence, cigarettes, uncomplicated: Secondary | ICD-10-CM | POA: Diagnosis present

## 2021-12-08 DIAGNOSIS — M797 Fibromyalgia: Secondary | ICD-10-CM | POA: Diagnosis present

## 2021-12-08 DIAGNOSIS — Z833 Family history of diabetes mellitus: Secondary | ICD-10-CM | POA: Diagnosis not present

## 2021-12-08 DIAGNOSIS — S82145A Nondisplaced bicondylar fracture of left tibia, initial encounter for closed fracture: Secondary | ICD-10-CM | POA: Diagnosis present

## 2021-12-08 DIAGNOSIS — F32A Depression, unspecified: Secondary | ICD-10-CM | POA: Diagnosis present

## 2021-12-08 DIAGNOSIS — Y92009 Unspecified place in unspecified non-institutional (private) residence as the place of occurrence of the external cause: Secondary | ICD-10-CM | POA: Diagnosis not present

## 2021-12-08 DIAGNOSIS — M79602 Pain in left arm: Secondary | ICD-10-CM | POA: Diagnosis not present

## 2021-12-08 DIAGNOSIS — M25062 Hemarthrosis, left knee: Secondary | ICD-10-CM | POA: Diagnosis present

## 2021-12-08 DIAGNOSIS — S52021D Displaced fracture of olecranon process without intraarticular extension of right ulna, subsequent encounter for closed fracture with routine healing: Secondary | ICD-10-CM | POA: Diagnosis not present

## 2021-12-08 DIAGNOSIS — Z888 Allergy status to other drugs, medicaments and biological substances status: Secondary | ICD-10-CM | POA: Diagnosis not present

## 2021-12-08 DIAGNOSIS — G8929 Other chronic pain: Secondary | ICD-10-CM | POA: Diagnosis present

## 2021-12-08 DIAGNOSIS — W010XXA Fall on same level from slipping, tripping and stumbling without subsequent striking against object, initial encounter: Secondary | ICD-10-CM | POA: Diagnosis present

## 2021-12-08 DIAGNOSIS — A499 Bacterial infection, unspecified: Secondary | ICD-10-CM | POA: Diagnosis not present

## 2021-12-08 DIAGNOSIS — G8918 Other acute postprocedural pain: Secondary | ICD-10-CM | POA: Diagnosis not present

## 2021-12-08 DIAGNOSIS — S42409A Unspecified fracture of lower end of unspecified humerus, initial encounter for closed fracture: Secondary | ICD-10-CM | POA: Diagnosis present

## 2021-12-08 DIAGNOSIS — J449 Chronic obstructive pulmonary disease, unspecified: Secondary | ICD-10-CM | POA: Diagnosis present

## 2021-12-08 DIAGNOSIS — E119 Type 2 diabetes mellitus without complications: Secondary | ICD-10-CM | POA: Diagnosis present

## 2021-12-08 DIAGNOSIS — E7800 Pure hypercholesterolemia, unspecified: Secondary | ICD-10-CM | POA: Diagnosis present

## 2021-12-08 DIAGNOSIS — S52209A Unspecified fracture of shaft of unspecified ulna, initial encounter for closed fracture: Secondary | ICD-10-CM | POA: Diagnosis present

## 2021-12-08 DIAGNOSIS — E1169 Type 2 diabetes mellitus with other specified complication: Secondary | ICD-10-CM | POA: Diagnosis not present

## 2021-12-08 DIAGNOSIS — M25462 Effusion, left knee: Secondary | ICD-10-CM | POA: Diagnosis present

## 2021-12-08 LAB — HIV ANTIBODY (ROUTINE TESTING W REFLEX): HIV Screen 4th Generation wRfx: NONREACTIVE

## 2021-12-08 LAB — GLUCOSE, CAPILLARY
Glucose-Capillary: 185 mg/dL — ABNORMAL HIGH (ref 70–99)
Glucose-Capillary: 144 mg/dL — ABNORMAL HIGH (ref 70–99)

## 2021-12-08 LAB — VITAMIN D 25 HYDROXY (VIT D DEFICIENCY, FRACTURES): Vit D, 25-Hydroxy: 66.99 ng/mL (ref 30–100)

## 2021-12-08 MED ORDER — METHOCARBAMOL 1000 MG/10ML IJ SOLN
500.0000 mg | Freq: Four times a day (QID) | INTRAVENOUS | Status: DC | PRN
  Filled 2021-12-08: qty 5

## 2021-12-08 MED ORDER — OXYCODONE HCL 5 MG PO TABS
10.0000 mg | ORAL_TABLET | Freq: Once | ORAL | Status: AC
  Administered 2021-12-08: 10 mg via ORAL
  Filled 2021-12-08: qty 2

## 2021-12-08 MED ORDER — BISOPROLOL-HYDROCHLOROTHIAZIDE 2.5-6.25 MG PO TABS
1.0000 | ORAL_TABLET | Freq: Every day | ORAL | Status: DC
  Administered 2021-12-09 – 2021-12-12 (×3): 1 via ORAL
  Filled 2021-12-08 (×4): qty 1

## 2021-12-08 MED ORDER — INSULIN ASPART 100 UNIT/ML IJ SOLN
0.0000 [IU] | Freq: Three times a day (TID) | INTRAMUSCULAR | Status: DC
  Administered 2021-12-08: 3 [IU] via SUBCUTANEOUS
  Administered 2021-12-09: 2 [IU] via SUBCUTANEOUS
  Administered 2021-12-10 (×2): 3 [IU] via SUBCUTANEOUS
  Administered 2021-12-10: 2 [IU] via SUBCUTANEOUS
  Administered 2021-12-11: 3 [IU] via SUBCUTANEOUS
  Administered 2021-12-11: 5 [IU] via SUBCUTANEOUS
  Administered 2021-12-12 (×2): 2 [IU] via SUBCUTANEOUS

## 2021-12-08 MED ORDER — BISACODYL 5 MG PO TBEC: 5.0000 mg | DELAYED_RELEASE_TABLET | Freq: Every day | ORAL | Status: DC | PRN

## 2021-12-08 MED ORDER — INSULIN ASPART 100 UNIT/ML IJ SOLN: 0.0000 [IU] | Freq: Every day | INTRAMUSCULAR | Status: DC

## 2021-12-08 MED ORDER — MORPHINE SULFATE (PF) 4 MG/ML IV SOLN
4.0000 mg | Freq: Once | INTRAVENOUS | Status: AC
  Administered 2021-12-08: 4 mg via INTRAVENOUS
  Filled 2021-12-08: qty 1

## 2021-12-08 MED ORDER — EZETIMIBE 10 MG PO TABS
10.0000 mg | ORAL_TABLET | Freq: Every day | ORAL | Status: DC
  Administered 2021-12-09 – 2021-12-12 (×4): 10 mg via ORAL
  Filled 2021-12-08 (×4): qty 1

## 2021-12-08 MED ORDER — ROSUVASTATIN CALCIUM 5 MG PO TABS
10.0000 mg | ORAL_TABLET | ORAL | Status: DC
  Administered 2021-12-08 – 2021-12-12 (×3): 10 mg via ORAL
  Filled 2021-12-08 (×5): qty 2

## 2021-12-08 MED ORDER — ASPIRIN EC 81 MG PO TBEC
81.0000 mg | DELAYED_RELEASE_TABLET | Freq: Every day | ORAL | Status: DC
  Filled 2021-12-08: qty 1

## 2021-12-08 MED ORDER — ACETAMINOPHEN 500 MG PO TABS: 1000.0000 mg | ORAL_TABLET | Freq: Once | ORAL | Status: AC

## 2021-12-08 MED ORDER — NICOTINE 21 MG/24HR TD PT24
21.0000 mg | MEDICATED_PATCH | Freq: Every day | TRANSDERMAL | Status: DC
  Administered 2021-12-08 – 2021-12-12 (×5): 21 mg via TRANSDERMAL
  Filled 2021-12-08 (×5): qty 1

## 2021-12-08 MED ORDER — METHOCARBAMOL 500 MG PO TABS
500.0000 mg | ORAL_TABLET | Freq: Four times a day (QID) | ORAL | Status: DC | PRN
  Administered 2021-12-08 – 2021-12-12 (×14): 500 mg via ORAL
  Filled 2021-12-08 (×15): qty 1

## 2021-12-08 MED ORDER — ONDANSETRON HCL 4 MG/2ML IJ SOLN: 4.0000 mg | Freq: Four times a day (QID) | INTRAMUSCULAR | Status: DC | PRN

## 2021-12-08 MED ORDER — CHLORHEXIDINE GLUCONATE 4 % EX LIQD
60.0000 mL | Freq: Once | CUTANEOUS | Status: AC
  Administered 2021-12-09: 4 via TOPICAL
  Filled 2021-12-08: qty 60

## 2021-12-08 MED ORDER — MORPHINE SULFATE (PF) 2 MG/ML IV SOLN
2.0000 mg | INTRAVENOUS | Status: DC | PRN
  Administered 2021-12-08 – 2021-12-12 (×4): 2 mg via INTRAVENOUS
  Filled 2021-12-08 (×5): qty 1

## 2021-12-08 MED ORDER — POLYETHYLENE GLYCOL 3350 17 G PO PACK: 17.0000 g | PACK | Freq: Every day | ORAL | Status: DC | PRN

## 2021-12-08 MED ORDER — OXYCODONE HCL 5 MG PO TABS
5.0000 mg | ORAL_TABLET | ORAL | Status: DC | PRN
  Administered 2021-12-08 – 2021-12-09 (×3): 10 mg via ORAL
  Administered 2021-12-09: 5 mg via ORAL
  Administered 2021-12-09 – 2021-12-10 (×3): 10 mg via ORAL
  Filled 2021-12-08 (×7): qty 2

## 2021-12-08 MED ORDER — DOCUSATE SODIUM 100 MG PO CAPS
100.0000 mg | ORAL_CAPSULE | Freq: Two times a day (BID) | ORAL | Status: DC
  Administered 2021-12-08 – 2021-12-12 (×8): 100 mg via ORAL
  Filled 2021-12-08 (×8): qty 1

## 2021-12-08 MED ORDER — TRAZODONE HCL 50 MG PO TABS: 100.0000 mg | ORAL_TABLET | Freq: Every evening | ORAL | Status: DC | PRN

## 2021-12-08 MED ORDER — ESCITALOPRAM OXALATE 10 MG PO TABS
20.0000 mg | ORAL_TABLET | Freq: Every day | ORAL | Status: DC
Start: 2021-12-09 — End: 2021-12-12
  Administered 2021-12-09 – 2021-12-12 (×4): 20 mg via ORAL
  Filled 2021-12-08 (×4): qty 2

## 2021-12-08 MED ORDER — LACTATED RINGERS IV SOLN: INTRAVENOUS | Status: DC

## 2021-12-08 NOTE — Plan of Care (Signed)
?  Problem: Health Behavior/Discharge Planning: ?Goal: Ability to manage health-related needs will improve ?Outcome: Progressing ?  ?Problem: Clinical Measurements: ?Goal: Ability to maintain clinical measurements within normal limits will improve ?Outcome: Progressing ?Goal: Will remain free from infection ?Outcome: Progressing ?Goal: Diagnostic test results will improve ?Outcome: Progressing ?Goal: Respiratory complications will improve ?Outcome: Progressing ?  ?Problem: Clinical Measurements: ?Goal: Will remain free from infection ?Outcome: Progressing ?  ?Problem: Clinical Measurements: ?Goal: Diagnostic test results will improve ?Outcome: Progressing ?  ?Problem: Clinical Measurements: ?Goal: Respiratory complications will improve ?Outcome: Progressing ?  ?Problem: Activity: ?Goal: Risk for activity intolerance will decrease ?Outcome: Progressing ?  ?Problem: Coping: ?Goal: Level of anxiety will decrease ?Outcome: Progressing ?  ?Problem: Elimination: ?Goal: Will not experience complications related to bowel motility ?Outcome: Progressing ?Goal: Will not experience complications related to urinary retention ?Outcome: Progressing ?  ?Problem: Pain Managment: ?Goal: General experience of comfort will improve ?Outcome: Progressing ?  ?Problem: Safety: ?Goal: Ability to remain free from injury will improve ?Outcome: Progressing ?  ?Problem: Skin Integrity: ?Goal: Risk for impaired skin integrity will decrease ?Outcome: Progressing ?  ?

## 2021-12-08 NOTE — Consult Note (Signed)
Reason for Consult:Polytrauma ?Referring Physician: Jonah BlueJennifer Yates ?Time called: 1310 ?Time at bedside: 1351 ? ? ?Alexandra KinsmanJulie W Parsons is an 61 y.o. female.  ?HPI: Alexandra Parsons was walking at work when she tripped and fell. She had her hands full and so fell onto both elbows and both knees. She had immediate bilateral elbow pain and left knee pain. She was able to be helped up and managed to take a few steps. [facility] where workup showed bilateral elbow fxs. She was [facility] to [facility] and orthopedic surgery was consulted. 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 ? ?Past Medical History:  ?Diagnosis Date  ? Abnormal Pap smear of cervix   ? h/o HPV  ? Chronic pain disorder   ? arthitis and fibromyalgia  ? Chronic sinusitis, chronic cough (?COPD) 07/16/2009  ? Depression   ? Diabetes mellitus   ? type 2  ? Dyslipidemia   ? Elevated LFTs 11/19/2011  ? Hypertension   ? Leukocytosis 11/19/2011  ? Menopausal syndrome   ? Neck pain 05/20/2011  ? 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 Submandibular gland swelling   ? Right  ? ? ?Past Surgical History:  ?Procedure Laterality Date  ? abdominal plasty remotely    ? NASAL SEPTUM SURGERY  12/26/2015  ? pilondial cyst removal    ? TUBAL LIGATION    ? TURBINATE REDUCTION Bilateral 12/26/2015  ? ? ?Family History  ?Problem Relation Age of Onset  ? Hypertension Other   ?     GM  ? Colon cancer Other   ?     COLON CA GGM x 2  ? Diabetes Other   ?     aunt  ? CAD Maternal Grandfather 45  ? Stroke Neg Hx   ? Breast cancer Neg Hx   ? ? ?Social History:  reports that she has been smoking cigarettes. She has a 28.00 pack-year smoking history. She has never used smokeless tobacco. She reports current alcohol use. She reports that she does not use drugs. ? ?Allergies:  ?Allergies  ?Allergen Reactions  ? Erythromycin   ?  REACTION: palpitations; ZPACK is ok  ? Fluoxetine Hcl   ?  REACTION: hallucinations  ? Venlafaxine   ?  REACTION: made her feel bad  ? ? ?Medications: I have reviewed the  patient's current medications. ? ?Results for orders placed or performed during the hospital encounter of 12/07/21 (from the past 48 hour(s))  ?CBC with Differential     Status: Abnormal  ? Collection Time: 12/07/21  6:44 PM  ?Result Value Ref Range  ? WBC 10.7 (H) 4.0 - 10.5 K/uL  ? RBC 3.84 (L) 3.87 - 5.11 MIL/uL  ? Hemoglobin 12.2 12.0 - 15.0 g/dL  ? HCT 36.4 36.0 - 46.0 %  ? MCV 94.8 80.0 - 100.0 fL  ? MCH 31.8 26.0 - 34.0 pg  ? MCHC 33.5 30.0 - 36.0 g/dL  ? RDW 14.2 11.5 - 15.5 %  ? Platelets 174 150 - 400 K/uL  ? nRBC 0.0 0.0 - 0.2 %  ? Neutrophils Relative % 74 %  ? Neutro Abs 7.8 (H) 1.7 - 7.7 K/uL  ? Lymphocytes Relative 19 %  ? Lymphs Abs 2.0 0.7 - 4.0 K/uL  ? Monocytes Relative 7 %  ? Monocytes Absolute 0.7 0.1 - 1.0 K/uL  ? Eosinophils Relative 0 %  ? Eosinophils Absolute 0.0 0.0 - 0.5 K/uL  ? Basophils Relative 0 %  ?  Basophils Absolute 0.0 0.0 - 0.1 K/uL  ? Immature Granulocytes 0 %  ? Abs Immature Granulocytes 0.04 0.00 - 0.07 K/uL  ?  Comment: Performed at [facility], [address], [place], [place] [address]  ?Magnesium     Status: None  ? Collection Time: 12/07/21  6:44 PM  ?Result Value Ref Range  ? Magnesium 1.8 1.7 - 2.4 mg/dL  ?  Comment: Performed at [facility], [address] [place], [place] [address]  ?Basic metabolic panel     Status: Abnormal  ? Collection Time: 12/07/21  6:44 PM  ?Result Value Ref Range  ? Sodium 137 135 - 145 mmol/L  ? Potassium 4.3 3.5 - 5.1 mmol/L  ? Chloride 103 98 - 111 mmol/L  ? CO2 28 22 - 32 mmol/L  ? Glucose, Bld 126 (H) 70 - 99 mg/dL  ?  Comment: Glucose reference range applies only to samples taken after fasting for at least 8 hours.  ? BUN 14 8 - 23 mg/dL  ? Creatinine, Ser 0.69 0.44 - 1.00 mg/dL  ? Calcium 10.0 8.9 - 10.3 mg/dL  ? GFR, Estimated >60 >60 mL/min  ?  Comment: (NOTE) ?Calculated using the CKD-EPI Creatinine Equation (2021) ?  ? Anion gap 6 5 - 15  ?  Comment: Performed at [facility], [address] [place], [place] [address]  ? ? ?DG Elbow Complete Left ? ?Result Date: 12/07/2021 ?CLINICAL DATA:  Elbow pain post fall EXAM: LEFT ELBOW - COMPLETE 3+ VIEW COMPARISON:  None FINDINGS: Osseous demineralization. Moderate-sized joint effusion. Nondisplaced intra-articular radial head fracture. No additional fracture, dislocation, or bone destruction. IMPRESSION: Nondisplaced intra-articular fracture of the LEFT radial head with associated moderate elbow joint effusion. Electronically Signed   By: Ulyses Southward M.D.   On: 12/07/2021 18:25  ? ?DG Elbow Complete Right ? ?Result Date: 12/07/2021 ?CLINICAL DATA:  Trauma, fall EXAM: RIGHT ELBOW - COMPLETE 3+ VIEW COMPARISON:  None. FINDINGS: Comminuted fracture is seen in the proximal ulna mostly involving the olecranon process. There is 3-5 mm distraction of fracture fragments. Fracture lines are extending to the articular surface. Small calcific density is seen adjacent to the tip of coronoid process of proximal ulna suggesting recent or old avulsion. There is marked soft tissue swelling along the posterior aspect of right elbow. This may suggest hematoma. There are no opaque foreign bodies. IMPRESSION: Severely comminuted, slightly displaced fracture is seen in the proximal right ulna. Electronically Signed   By: Ernie Avena M.D.   On: 12/07/2021 18:25  ? ?DG Knee Complete 4 Views Left ? ?Result Date: 12/07/2021 ?CLINICAL DATA:  Trauma, fall EXAM: LEFT KNEE - COMPLETE 4 VIEW COMPARISON:  None. FINDINGS: No displaced fracture or dislocation is seen. There is soft tissue fullness in the suprapatellar bursa. There are no opaque foreign bodies. IMPRESSION: No recent fracture or dislocation is seen in the left knee. Moderate to large effusion, possibly hemarthrosis in the suprapatellar bursa. Electronically Signed   By: Ernie Avena M.D.   On: 12/07/2021 18:26   ? ?Review of Systems  ?HENT:  Negative for ear discharge, ear pain,  hearing loss and tinnitus.   ?Eyes:  Negative for photophobia and pain.  ?Respiratory:  Negative for cough and shortness of breath.   ?Cardiovascular:  Negative for chest pain.  ?Gastrointestinal:  Negative for abdominal pain, nausea and vomiting.  ?Genitourinary:  Negative for dysuria, flank pain, frequency and urgency.  ?Musculoskeletal:  Positive for arthralgias (Bilateral elbows  R>L, left knee). Negative for back pain, myalgias and neck pain.  ?Neurological:  Negative for dizziness and headaches.  ?Hematological:  Does not bruise/bleed easily.  ?Psychiatric/Behavioral:  The patient is not nervous/anxious.   ?Blood pressure 120/68, pulse 66, temperature 99 ?F (37.2 ?C), temperature source Oral, resp. rate 17, weight 77.6 kg, SpO2 98 %. ?Physical Exam ?Constitutional:   ?   General: She is not in acute distress. ?   Appearance: She is well-developed. She is not diaphoretic.  ?HENT:  ?   Head: Normocephalic and atraumatic.  ?Eyes:  ?   General: No scleral icterus.    ?   Right eye: No discharge.     ?   Left eye: No discharge.  ?   Conjunctiva/sclera: Conjunctivae normal.  ?Cardiovascular:  ?   Rate and Rhythm: Normal rate and regular rhythm.  ?Pulmonary:  ?   Effort: Pulmonary effort is normal. No respiratory distress.  ?Musculoskeletal:  ?   Cervical back: Normal range of motion.  ?   Comments: Right shoulder, elbow, wrist, digits- Elbow splint in place, no instability, no blocks to motion ? Sens  Ax/R/M/U intact ? Mot   Ax/ R/ PIN/ M/ AIN/ U intact ? Fingers perfused ? ?Left shoulder, elbow, wrist, digits- no skin wounds, mild TTP elbow, no instability, no blocks to motion ? Sens  Ax/R/M/U intact ? Mot   Ax/ R/ PIN/ M/ AIN/ U intact ? Rad 2+ ? ?LLE No traumatic wounds, ecchymosis, or rash ? KI in place, mod TTP ant knee ? Mild knee effusion ? Sens DPN, SPN, TN intact ? Motor EHL, ext, flex, evers 5/5 ? DP 2+, PT 2+, No significant edema  ?Skin: ?   General: Skin is warm and dry.  ?Neurological:  ?   Mental  Status: She is alert.  ?Psychiatric:     ?   Mood and Affect: Mood normal.     ?   Behavior: Behavior normal.  ? ? ?Assessment/Plan: ?Right elbow fx -- Plan ORIF today with Dr. Steward Drone. Please keep NPO. ?Left radial head

## 2021-12-08 NOTE — Anesthesia Preprocedure Evaluation (Addendum)
Anesthesia Evaluation  ?Patient identified by MRN, date of birth, ID band ?Patient awake ? ? ? ?Reviewed: ?Allergy & Precautions, NPO status , Patient's Chart, lab work & pertinent test results ? ?History of Anesthesia Complications ?Negative for: history of anesthetic complications ? ?Airway ?Mallampati: II ? ?TM Distance: >3 FB ?Neck ROM: Full ? ? ? Dental ? ?(+) Dental Advisory Given, Poor Dentition ?  ?Pulmonary ?COPD, Current Smoker and Patient abstained from smoking.,  ?  ?Pulmonary exam normal ?breath sounds clear to auscultation ? ? ? ? ? ? Cardiovascular ?hypertension, Pt. on medications ?Normal cardiovascular exam ?Rhythm:Regular Rate:Normal ? ? ?  ?Neuro/Psych ?PSYCHIATRIC DISORDERS Anxiety Depression negative neurological ROS ?   ? GI/Hepatic ?negative GI ROS, Neg liver ROS,   ?Endo/Other  ?diabetes, Type 2, Oral Hypoglycemic Agents ? Renal/GU ?negative Renal ROS  ?negative genitourinary ?  ?Musculoskeletal ? ?(+) Fibromyalgia -Right elbow fracture  ? Abdominal ?  ?Peds ? Hematology ?negative hematology ROS ?(+)   ?Anesthesia Other Findings ?Day of surgery medications reviewed with patient. ? Reproductive/Obstetrics ?negative OB ROS ? ?  ? ? ? ? ? ? ? ? ? ? ? ? ? ?  ?  ? ? ? ? ? ?Anesthesia Physical ?Anesthesia Plan ? ?ASA: 2 ? ?Anesthesia Plan: Regional and General  ? ?Post-op Pain Management: Tylenol PO (pre-op)* and Regional block*  ? ?Induction: Intravenous ? ?PONV Risk Score and Plan: 2 and Treatment may vary due to age or medical condition, Midazolam, Dexamethasone and Ondansetron ? ?Airway Management Planned: Oral ETT ? ?Additional Equipment: None ? ?Intra-op Plan:  ? ?Post-operative Plan: Extubation in OR ? ?Informed Consent: I have reviewed the patients History and Physical, chart, labs and discussed the procedure including the risks, benefits and alternatives for the proposed anesthesia with the patient or authorized representative who has indicated his/her  understanding and acceptance.  ? ?Patient has DNR.  ?Discussed DNR with patient and Suspend DNR. ?  ?Dental advisory given ? ?Plan Discussed with: CRNA ? ?Anesthesia Plan Comments:   ? ? ? ?Anesthesia Quick Evaluation ? ?

## 2021-12-08 NOTE — ED Notes (Signed)
Patient states she is in too much pain for blood pressure check.  States she can not tolerate the BP Cuff. ?

## 2021-12-08 NOTE — TOC Initial Note (Addendum)
Transition of Care (TOC) - Initial/Assessment Note  ? ? ?Patient Details  ?Name: Alexandra Parsons ?MRN: [id number] ?Date of Birth: Sep 12, 1960 ? ?Transition of Care (TOC) CM/SW Contact:    ?Bess Kinds, RN ?Phone Number: [phone] ?12/08/2021, 2:20 PM ? ?Clinical Narrative:                 ? ?Spoke with patient on her cell phone. Patient unable to hold her cell phone, however, her daughter answered and placed call on speaker. PTA patient was independent, driving, and working. Lives alone. Demographics verified.  ? ?Stated that she fell at work and was told to go to [facility] where she was transported to [facility]. Patient does not have workman comp information, but she will call to get information and provide to TOC.  ? ?Patient stated at present she is unable to complete adls and mobility without assistance and is nonweightbearing with some of her extremities. Patient stated that she has no one who can stay with her or who she can stay.  ? ?Discussed ongoing workup and likely PT/OT evaluation. TOC to follow for transition needs.  ? ?Expected Discharge Plan: Home w Home Health Services ?Barriers to Discharge: Continued Medical Work up ? ? ?Patient Goals and CMS Choice ?Patient states their goals for this hospitalization and ongoing recovery are:: return to her home and be able to care for herself ?CMS Medicare.gov Compare Post Acute Care list provided to:: Patient ?  ? ?Expected Discharge Plan and Services ?Expected Discharge Plan: Home w Home Health Services ?  ?Discharge Planning Services: CM Consult ?  ?  ?                ?  ?  ?  ?  ?  ?  ?  ?  ?  ?  ? ?Prior Living Arrangements/Services ?  ?Lives with:: Self ?Patient language and need for interpreter reviewed:: Yes ?       ?  ?  ?  ?Criminal Activity/Legal Involvement Pertinent to Current Situation/Hospitalization: No - Comment as needed ? ?Activities of Daily Living ?Home Assistive Devices/Equipment: None ?ADL Screening (condition at time of  admission) ?Patient's cognitive ability adequate to safely complete daily activities?: Yes ?Is the patient deaf or have difficulty hearing?: No ?Does the patient have difficulty seeing, even when wearing glasses/contacts?: Yes ?Does the patient have difficulty concentrating, remembering, or making decisions?: No ?Patient able to express need for assistance with ADLs?: Yes ?Does the patient have difficulty dressing or bathing?: Yes ?Independently performs ADLs?: Yes (appropriate for developmental age) ?Does the patient have difficulty walking or climbing stairs?: Yes ?Weakness of Legs: Both ?Weakness of Arms/Hands: Both ? ?Permission Sought/Granted ?  ?  ?   ?   ?   ?   ? ?Emotional Assessment ?  ?Attitude/Demeanor/Rapport: Engaged ?Affect (typically observed): Accepting ?Orientation: : Oriented to Self, Oriented to  Time, Oriented to Place, Oriented to Situation ?Alcohol / Substance Use: Not Applicable ?Psych Involvement: No (comment) ? ?Admission diagnosis:  Elbow fracture [S42.409A] ?Fall, initial encounter [W19.XXXA] ?Patient Active Problem List  ? Diagnosis Date Noted  ? Fracture of ulnar shaft, closed 12/08/2021  ? Chronic pain disorder 12/08/2021  ? Fracture of radial head, left, closed 12/08/2021  ? Knee effusion, left 12/08/2021  ? Fall at home, initial encounter 12/08/2021  ? Vitamin D deficiency 10/16/2021  ? Fibromyalgia 05/09/2021  ? Insomnia 10/28/2020  ? Tobacco abuse 06/08/2019  ? COPD- per CT 09-2018 11/26/2018  ? PCP NOTES >>>>>>>>>>>>>>>>>>>>>  12/30/2015  ? Leukocytosis 11/19/2011  ? Elevated LFTs 11/19/2011  ? Annual physical exam 09/18/2011  ? Neck pain-- chronic--UDS 05/20/2011  ? Chronic sinusitis, chronic cough (?COPD) 07/16/2009  ? ADHD 02/08/2008  ? POSITIVE PPD 02/08/2008  ? MENOPAUSAL SYNDROME 10/25/2007  ? DM w/o complication type II (HCC) 02/10/2007  ? High cholesterol 02/10/2007  ? Anxiety and depression 02/10/2007  ? HTN (hypertension) 02/10/2007  ? ?PCP:  Wanda Plump, MD ?Pharmacy:    ?[facility] [place] [facility] ?[address], [address] ?[place] [address] ?Phone: [phone] Fax: [phone] ? ? ? ? ?Social Determinants of Health (SDOH) Interventions ?  ? ?Readmission Risk Interventions ?   ? View : No data to display.  ?  ?  ?  ? ? ? ?

## 2021-12-08 NOTE — H&P (Signed)
?History and Physical  ? ? ?Patient: Alexandra Parsons SVX:[id number] DOB: Mar 29, 1961 ?DOA: 12/07/2021 ?DOS: the patient was seen and examined on 12/08/2021 ?PCP: Wanda Plump, MD  ?Patient coming from: Home - lives alone; [place]: Daughter, [phone]  ? ? ?Chief Complaint: Fall ? ?HPI: Alexandra Parsons is a 61 y.o. female with medical history significant of DM; HLD; HTN; and chronic pain presenting with B elbow fractures occurring from a fall. She was walking down a hallway at work and her foot didn't move well and she tripped.  She landed on her knees and elbows.  She was not light-headed or dizzy before falling.  She fell in 07/2020 when a walker gave way and she fell again in [place] about a year ago.  She had another fall about 3 weeks ago and didn't get injured.  She attributes some of the falls to her vision and likely needs to see an eye doctor, has not seen in years despite her DM.  She got Dilaudid at the ER and she had transient hypoxia afterwards. ? ? ? ?ER Course:  Carryover, per Dr. Imogene Burn: ? ?61 yo WF with hx of chronic pain, on percocet, DM, HTN, with mechanical fall. not on systemic anticoagulation.  noted on xray today to have bilateral elbow fractures. ortho has recommended outpatient evalaution. pt already has percocet at home for her chronic pain. labs WNL. If non-operative, she will need PT/OT assessment and CM assistance if pt needs more help at home. ? ? ? ? ?Review of Systems: As mentioned in the history of present illness. All other systems reviewed and are negative. ?Past Medical History:  ?Diagnosis Date  ? Abnormal Pap smear of cervix   ? h/o HPV  ? Chronic pain disorder   ? arthitis and fibromyalgia  ? Chronic sinusitis, chronic cough (?COPD) 07/16/2009  ? Depression   ? Diabetes mellitus   ? type 2  ? Dyslipidemia   ? Elevated LFTs 11/19/2011  ? Hypertension   ? Leukocytosis 11/19/2011  ? Menopausal syndrome   ? Neck pain 05/20/2011  ? 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 Submandibular gland  swelling   ? Right  ? ?Past Surgical History:  ?Procedure Laterality Date  ? abdominal plasty remotely    ? NASAL SEPTUM SURGERY  12/26/2015  ? pilondial cyst removal    ? TUBAL LIGATION    ? TURBINATE REDUCTION Bilateral 12/26/2015  ? ?Social History:  reports that she has been smoking cigarettes. She has a 28.00 pack-year smoking history. She has never used smokeless tobacco. She reports current alcohol use. She reports that she does not use drugs. ? ?Allergies  ?Allergen Reactions  ? Erythromycin   ?  REACTION: palpitations; ZPACK is ok  ? Fluoxetine Hcl   ?  REACTION: hallucinations  ? Venlafaxine   ?  REACTION: made her feel bad  ? ? ?Family History  ?Problem Relation Age of Onset  ? Hypertension Other   ?     GM  ? Colon cancer Other   ?     COLON CA GGM x 2  ? Diabetes Other   ?     aunt  ? CAD Maternal Grandfather 45  ? Stroke Neg Hx   ? Breast cancer Neg Hx   ? ? ?Prior to Admission medications   ?Medication Sig Start Date End Date Taking? Authorizing Provider  ?oxyCODONE (ROXICODONE) 5 MG immediate release tablet Take 1 tablet (5 mg total) by mouth every  4 (four) hours as needed for up to 4 days for severe pain. 12/07/21 12/12/21 Yes Gloris Manchester, MD  ?aspirin EC 81 MG tablet Take 81 mg by mouth daily.    [provider]  ?bisoprolol-hydrochlorothiazide (ZIAC) 2.5-6.25 MG tablet Take 1 tablet by mouth daily. 10/22/21   Wanda Plump, MD  ?docusate sodium (COLACE) 250 MG capsule Take 250 mg by mouth daily as needed for constipation.    [provider]  ?escitalopram (LEXAPRO) 20 MG tablet Take 1 tablet (20 mg total) by mouth daily. 10/22/21   Wanda Plump, MD  ?ezetimibe (ZETIA) 10 MG tablet Take 1 tablet (10 mg total) by mouth daily. 10/22/21   Wanda Plump, MD  ?metFORMIN (GLUCOPHAGE) 1000 MG tablet Take 1 tablet (1,000 mg total) by mouth 2 (two) times daily with a meal. 10/22/21   Wanda Plump, MD  ?oxyCODONE-acetaminophen (PERCOCET/ROXICET) 5-325 MG tablet Take 1 tablet by mouth every 6 (six) hours  as needed. 12/05/21   Wanda Plump, MD  ?pioglitazone (ACTOS) 30 MG tablet Take 1 tablet (30 mg total) by mouth daily. 10/22/21   Wanda Plump, MD  ?rosuvastatin (CRESTOR) 10 MG tablet Take 1 tablet (10 mg total) by mouth every Monday, Wednesday, and Friday. 10/17/21   Wanda Plump, MD  ?sitaGLIPtin (JANUVIA) 100 MG tablet Take 1 tablet (100 mg total) by mouth daily. 10/22/21   Wanda Plump, MD  ?traZODone (DESYREL) 100 MG tablet Take 1 tablet (100 mg total) by mouth at bedtime as needed for sleep. 10/22/21   Wanda Plump, MD  ? ? ?Physical Exam: ?Vitals:  ? 12/08/21 0349 12/08/21 0615 12/08/21 1004 12/08/21 1300  ?BP: (!) 142/78  (!) 124/94 120/68  ?Pulse: 70 64 79 66  ?Resp: 18  18 17   ?Temp:    99 ?F (37.2 ?C)  ?TempSrc:    Oral  ?SpO2: 99% 93% 94% 98%  ?Weight:      ? ?General:  Appears calm and comfortable and is in NAD ?Eyes:  EOMI, normal lids, iris ?ENT:  grossly normal hearing, lips & tongue, mmm ?Neck:  no LAD, masses or thyromegaly ?Cardiovascular:  RRR, no m/r/g. No LE edema.  ?Respiratory:   CTA bilaterally with no wheezes/rales/rhonchi.  Normal respiratory effort. ?Abdomen:  soft, NT, ND ?Skin:  no rash or induration seen on limited exam ?Musculoskeletal:  R arm is splinted; L arm without obvious deformity, sling at bedside; L knee with large effusion ?Psychiatric:  grossly normal mood and affect, speech fluent and appropriate, AOx3 ?Neurologic:  CN 2-12 grossly intact ? ? ?Radiological Exams on Admission: ?Independently reviewed - see discussion in A/P where applicable ? ?DG Elbow Complete Left ? ?Result Date: 12/07/2021 ?CLINICAL DATA:  Elbow pain post fall EXAM: LEFT ELBOW - COMPLETE 3+ VIEW COMPARISON:  None FINDINGS: Osseous demineralization. Moderate-sized joint effusion. Nondisplaced intra-articular radial head fracture. No additional fracture, dislocation, or bone destruction. IMPRESSION: Nondisplaced intra-articular fracture of the LEFT radial head with associated moderate elbow joint effusion.  Electronically Signed   By: 12/09/2021 M.D.   On: 12/07/2021 18:25  ? ?DG Elbow Complete Right ? ?Result Date: 12/07/2021 ?CLINICAL DATA:  Trauma, fall EXAM: RIGHT ELBOW - COMPLETE 3+ VIEW COMPARISON:  None. FINDINGS: Comminuted fracture is seen in the proximal ulna mostly involving the olecranon process. There is 3-5 mm distraction of fracture fragments. Fracture lines are extending to the articular surface. Small calcific density is seen adjacent to the tip of coronoid process of  proximal ulna suggesting recent or old avulsion. There is marked soft tissue swelling along the posterior aspect of right elbow. This may suggest hematoma. There are no opaque foreign bodies. IMPRESSION: Severely comminuted, slightly displaced fracture is seen in the proximal right ulna. Electronically Signed   By: Ernie AvenaPalani  Rathinasamy M.D.   On: 12/07/2021 18:25  ? ?DG Knee Complete 4 Views Left ? ?Result Date: 12/07/2021 ?CLINICAL DATA:  Trauma, fall EXAM: LEFT KNEE - COMPLETE 4 VIEW COMPARISON:  None. FINDINGS: No displaced fracture or dislocation is seen. There is soft tissue fullness in the suprapatellar bursa. There are no opaque foreign bodies. IMPRESSION: No recent fracture or dislocation is seen in the left knee. Moderate to large effusion, possibly hemarthrosis in the suprapatellar bursa. Electronically Signed   By: Ernie AvenaPalani  Rathinasamy M.D.   On: 12/07/2021 18:26   ? ?EKG: not done ? ? ?Labs on Admission: I have personally reviewed the available labs and imaging studies at the time of the admission. ? ?Pertinent labs:   ? ?Glucose 126 ?WBC 10.7 ? ? ?Assessment and Plan: ?Principal Problem: ?  Fall at home, initial encounter ?Active Problems: ?  DM w/o complication type II (HCC) ?  High cholesterol ?  Anxiety and depression ?  HTN (hypertension) ?  Tobacco abuse ?  Fracture of ulnar shaft, closed ?  Chronic pain disorder ?  Fracture of radial head, left, closed ?  Knee effusion, left ?  Frequent falls ?  DNR (do not resuscitate) ?   ? ?Fall ?-Patient with 3+ falls in the last 1-2 years ?-Today's fall with various orthopedic injuries (see below) ?-Needs diabetic eye exam ASAP, as she attributes at least some of the falls to vision issues ?-S

## 2021-12-08 NOTE — H&P (Signed)
? ?ORTHOPAEDIC CONSULTATION ? ?REQUESTING PHYSICIAN: Jonah Blue, MD ? ?Chief Complaint: Bilateral elbow pain, left knee pain ? ?HPI: ?Alexandra Parsons is a 61 y.o. female who presents with bilateral elbow pain as well as left knee pain.  She had a fall on the day prior at which point she states that the left foot got caught underneath her and gave out.  She fell forward landing on both arms and left knee.  She subsequently has had difficulty moving bilateral arms and left knee.  She was initially able to place weight on the left knee although there is subsequently been significant swelling at which point she was no longer able to move or place weight on this.  She was placed in a knee immobilizer on the left knee in the emergency room.  She is placed in a splint for her right elbow.  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  She does have a history of chronic pain for which she is on long-term narcotics for what she states is attributable to arthritis.  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  She is here with her daughter. ? ?Past Medical History:  ?Diagnosis Date  ? Abnormal Pap smear of cervix   ? h/o HPV  ? Chronic pain disorder   ? arthitis and fibromyalgia  ? Chronic sinusitis, chronic cough (?COPD) 07/16/2009  ? Depression   ? Diabetes mellitus   ? type 2  ? Dyslipidemia   ? Elevated LFTs 11/19/2011  ? Hypertension   ? Leukocytosis 11/19/2011  ? Menopausal syndrome   ? Neck pain 05/20/2011  ? 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 Submandibular gland swelling   ? Right  ? ?Past Surgical History:  ?Procedure Laterality Date  ? abdominal plasty remotely    ? NASAL SEPTUM SURGERY  12/26/2015  ? pilondial cyst removal    ? TUBAL LIGATION    ? TURBINATE REDUCTION Bilateral 12/26/2015  ? ?Social History  ? ?Socioeconomic History  ? Marital status: Widowed  ?  Spouse name: Not on file  ? Number of children: 1  ? Years of education: Not on file  ? Highest education level: Not on file   ?Occupational History  ? Occupation: Sports coach  ?Tobacco Use  ? Smoking status: Every Day  ?  Packs/day: 1.00  ?  Years: 28.00  ?  Pack years: 28.00  ?  Types: Cigarettes  ? Smokeless tobacco: Never  ?Substance and Sexual Activity  ? Alcohol use: Yes  ?  Alcohol/week: 0.0 standard drinks  ?  Comment: rare  ? Drug use: No  ? Sexual activity: Not on file  ?Other Topics Concern  ? Not on file  ?Social History Narrative  ? Lost husband, prostate cancer, 08-2012  ? Lives by herself  ? Has a daughter , lives in [place]  ? ?Social Determinants of Health  ? ?Financial Resource Strain: Not on file  ?Food Insecurity: Not on file  ?Transportation Needs: Not on file  ?Physical Activity: Not on file  ?Stress: Not on file  ?Social Connections: Not on file  ? ?Family History  ?Problem Relation Age of Onset  ? Hypertension Other   ?     GM  ? Colon cancer Other   ?     COLON CA GGM x 2  ? Diabetes Other   ?     aunt  ? CAD Maternal Grandfather 45  ? Stroke Neg Hx   ?  Breast cancer Neg Hx   ? ?- negative except otherwise stated in the family history section ?Allergies  ?Allergen Reactions  ? Erythromycin   ?  REACTION: palpitations; ZPACK is ok  ? Fluoxetine Hcl   ?  REACTION: hallucinations  ? Venlafaxine   ?  REACTION: made her feel bad  ? ?Prior to Admission medications   ?Medication Sig Start Date End Date Taking? Authorizing Provider  ?aspirin EC 81 MG tablet Take 81 mg by mouth daily.    [provider]  ?bisoprolol-hydrochlorothiazide (ZIAC) 2.5-6.25 MG tablet Take 1 tablet by mouth daily. 10/22/21   Wanda Plump, MD  ?docusate sodium (COLACE) 250 MG capsule Take 250 mg by mouth daily as needed for constipation.    [provider]  ?escitalopram (LEXAPRO) 20 MG tablet Take 1 tablet (20 mg total) by mouth daily. 10/22/21   Wanda Plump, MD  ?ezetimibe (ZETIA) 10 MG tablet Take 1 tablet (10 mg total) by mouth daily. 10/22/21   Wanda Plump, MD  ?metFORMIN (GLUCOPHAGE) 1000 MG tablet Take 1 tablet (1,000 mg total) by  mouth 2 (two) times daily with a meal. 10/22/21   Wanda Plump, MD  ?oxyCODONE-acetaminophen (PERCOCET/ROXICET) 5-325 MG tablet Take 1 tablet by mouth every 6 (six) hours as needed. 12/05/21   Wanda Plump, MD  ?pioglitazone (ACTOS) 30 MG tablet Take 1 tablet (30 mg total) by mouth daily. 10/22/21   Wanda Plump, MD  ?rosuvastatin (CRESTOR) 10 MG tablet Take 1 tablet (10 mg total) by mouth every Monday, Wednesday, and Friday. 10/17/21   Wanda Plump, MD  ?sitaGLIPtin (JANUVIA) 100 MG tablet Take 1 tablet (100 mg total) by mouth daily. 10/22/21   Wanda Plump, MD  ?traZODone (DESYREL) 100 MG tablet Take 1 tablet (100 mg total) by mouth at bedtime as needed for sleep. 10/22/21   Wanda Plump, MD  ? ?DG Elbow Complete Left ? ?Result Date: 12/07/2021 ?CLINICAL DATA:  Elbow pain post fall EXAM: LEFT ELBOW - COMPLETE 3+ VIEW COMPARISON:  None FINDINGS: Osseous demineralization. Moderate-sized joint effusion. Nondisplaced intra-articular radial head fracture. No additional fracture, dislocation, or bone destruction. IMPRESSION: Nondisplaced intra-articular fracture of the LEFT radial head with associated moderate elbow joint effusion. Electronically Signed   By: Ulyses Southward M.D.   On: 12/07/2021 18:25  ? ?DG Elbow Complete Right ? ?Result Date: 12/07/2021 ?CLINICAL DATA:  Trauma, fall EXAM: RIGHT ELBOW - COMPLETE 3+ VIEW COMPARISON:  None. FINDINGS: Comminuted fracture is seen in the proximal ulna mostly involving the olecranon process. There is 3-5 mm distraction of fracture fragments. Fracture lines are extending to the articular surface. Small calcific density is seen adjacent to the tip of coronoid process of proximal ulna suggesting recent or old avulsion. There is marked soft tissue swelling along the posterior aspect of right elbow. This may suggest hematoma. There are no opaque foreign bodies. IMPRESSION: Severely comminuted, slightly displaced fracture is seen in the proximal right ulna. Electronically Signed   By: Ernie Avena M.D.   On: 12/07/2021 18:25  ? ?DG Knee Complete 4 Views Left ? ?Result Date: 12/07/2021 ?CLINICAL DATA:  Trauma, fall EXAM: LEFT KNEE - COMPLETE 4 VIEW COMPARISON:  None. FINDINGS: No displaced fracture or dislocation is seen. There is soft tissue fullness in the suprapatellar bursa. There are no opaque foreign bodies. IMPRESSION: No recent fracture or dislocation is seen in the left knee. Moderate to large effusion, possibly hemarthrosis in the suprapatellar bursa. Electronically Signed  By: Ernie AvenaPalani  Rathinasamy M.D.   On: 12/07/2021 18:26   ? ? ?Positive ROS: All other systems have been reviewed and were otherwise negative with the exception of those mentioned in the HPI and as above. ? ?Physical Exam: ?General: No acute distress ?Cardiovascular: No pedal edema ?Respiratory: No cyanosis, no use of accessory musculature ?GI: No organomegaly, abdomen is soft and non-tender ?Skin: No lesions in the area of chief complaint ?Neurologic: Sensation intact distally ?Psychiatric: Patient is at baseline mood and affect ?Lymphatic: No axillary or cervical lymphadenopathy ? ?MUSCULOSKELETAL:  ?Right elbow is in a splint.  She is able to fire EPL as well as wrist extensors and flexors.  Fires interosseous nerve.  Sensation is intact in all distributions of the right hand.  2+ radial pulse. ? ?Left elbow she has range of motion from 0-90.  This is otherwise painful past 90.  She has full pro supination.  2+ radial pulse.  Sensation is intact distally.  Tender about the radial head. ? ?Left knee with a tense effusion.  She has pain with any range of motion.  Able to fire EHL as well as tibialis anterior and gastrocsoleus.  2+ dorsalis pedis pulse.  She is not able to tolerate any ligamentous testing of the knee. ? ?Independent Imaging Review: ? ?X-ray right elbow, left elbow, left knee: ?She has a comminuted right olecranon fracture with displacement.  There is a nondisplaced left radial head fracture approximately 2  part.  She has a effusion about the left knee without fracture. ? ?Assessment: ?61 year old female right-hand-dominant with a right displaced olecranon fracture.  With regard to this given the fact that she

## 2021-12-09 ENCOUNTER — Inpatient Hospital Stay (HOSPITAL_COMMUNITY): Payer: BC Managed Care – PPO

## 2021-12-09 ENCOUNTER — Inpatient Hospital Stay (HOSPITAL_COMMUNITY): Payer: BC Managed Care – PPO | Admitting: Anesthesiology

## 2021-12-09 ENCOUNTER — Encounter (HOSPITAL_COMMUNITY)
Admission: EM | Disposition: A | Discharge status: Rehabilitation Facility | Payer: Self-pay | Source: Home / Self Care | Attending: Internal Medicine

## 2021-12-09 ENCOUNTER — Other Ambulatory Visit: Payer: Self-pay

## 2021-12-09 ENCOUNTER — Encounter (HOSPITAL_COMMUNITY): Payer: Self-pay | Admitting: Internal Medicine

## 2021-12-09 DIAGNOSIS — Y92009 Unspecified place in unspecified non-institutional (private) residence as the place of occurrence of the external cause: Secondary | ICD-10-CM | POA: Diagnosis not present

## 2021-12-09 DIAGNOSIS — M25062 Hemarthrosis, left knee: Secondary | ICD-10-CM

## 2021-12-09 DIAGNOSIS — W19XXXA Unspecified fall, initial encounter: Secondary | ICD-10-CM | POA: Diagnosis not present

## 2021-12-09 DIAGNOSIS — S52031A Displaced fracture of olecranon process with intraarticular extension of right ulna, initial encounter for closed fracture: Secondary | ICD-10-CM

## 2021-12-09 DIAGNOSIS — M2352 Chronic instability of knee, left knee: Secondary | ICD-10-CM

## 2021-12-09 DIAGNOSIS — F419 Anxiety disorder, unspecified: Secondary | ICD-10-CM | POA: Diagnosis not present

## 2021-12-09 DIAGNOSIS — S52125A Nondisplaced fracture of head of left radius, initial encounter for closed fracture: Secondary | ICD-10-CM

## 2021-12-09 HISTORY — PX: ORIF ELBOW FRACTURE: SHX5031

## 2021-12-09 HISTORY — PX: FINE NEEDLE ASPIRATION: SHX6590

## 2021-12-09 LAB — SURGICAL PCR SCREEN
MRSA, PCR: NEGATIVE
Staphylococcus aureus: NEGATIVE

## 2021-12-09 LAB — GLUCOSE, CAPILLARY
Glucose-Capillary: 125 mg/dL — ABNORMAL HIGH (ref 70–99)
Glucose-Capillary: 137 mg/dL — ABNORMAL HIGH (ref 70–99)
Glucose-Capillary: 139 mg/dL — ABNORMAL HIGH (ref 70–99)
Glucose-Capillary: 106 mg/dL — ABNORMAL HIGH (ref 70–99)
Glucose-Capillary: 159 mg/dL — ABNORMAL HIGH (ref 70–99)
Glucose-Capillary: 430 mg/dL — ABNORMAL HIGH (ref 70–99)

## 2021-12-09 SURGERY — OPEN REDUCTION INTERNAL FIXATION (ORIF) ELBOW/OLECRANON FRACTURE
Anesthesia: Monitor Anesthesia Care | Site: Knee | Laterality: Right

## 2021-12-09 MED ORDER — ASPIRIN 325 MG PO TABS
325.0000 mg | ORAL_TABLET | Freq: Every day | ORAL | Status: DC
  Administered 2021-12-10 – 2021-12-12 (×3): 325 mg via ORAL
  Filled 2021-12-09 (×3): qty 1

## 2021-12-09 MED ORDER — DEXAMETHASONE SODIUM PHOSPHATE 10 MG/ML IJ SOLN
INTRAMUSCULAR | Status: DC | PRN
  Administered 2021-12-09: 10 mg

## 2021-12-09 MED ORDER — FENTANYL CITRATE (PF) 100 MCG/2ML IJ SOLN: 25.0000 ug | INTRAMUSCULAR | Status: DC | PRN

## 2021-12-09 MED ORDER — CHLORHEXIDINE GLUCONATE 0.12 % MT SOLN
15.0000 mL | Freq: Once | OROMUCOSAL | Status: AC
  Administered 2021-12-09: 15 mL via OROMUCOSAL
  Filled 2021-12-09: qty 15

## 2021-12-09 MED ORDER — EPHEDRINE SULFATE-NACL 50-0.9 MG/10ML-% IV SOSY
PREFILLED_SYRINGE | INTRAVENOUS | Status: DC | PRN
  Administered 2021-12-09 (×3): 5 mg via INTRAVENOUS

## 2021-12-09 MED ORDER — 0.9 % SODIUM CHLORIDE (POUR BTL) OPTIME
TOPICAL | Status: DC | PRN
  Administered 2021-12-09: 1000 mL

## 2021-12-09 MED ORDER — INSULIN ASPART 100 UNIT/ML IJ SOLN
6.0000 [IU] | Freq: Once | INTRAMUSCULAR | Status: AC
  Administered 2021-12-09: 6 [IU] via SUBCUTANEOUS

## 2021-12-09 MED ORDER — MIDAZOLAM HCL 2 MG/2ML IJ SOLN
INTRAMUSCULAR | Status: AC
Start: 2021-12-09 — End: 2021-12-09
  Administered 2021-12-09: 1 mg via INTRAVENOUS
  Filled 2021-12-09: qty 2

## 2021-12-09 MED ORDER — LIDOCAINE 2% (20 MG/ML) 5 ML SYRINGE
INTRAMUSCULAR | Status: DC | PRN
  Administered 2021-12-09: 60 mg via INTRAVENOUS

## 2021-12-09 MED ORDER — SCOPOLAMINE 1 MG/3DAYS TD PT72
1.0000 | MEDICATED_PATCH | Freq: Once | TRANSDERMAL | Status: DC
  Administered 2021-12-09: 1.5 mg via TRANSDERMAL
  Filled 2021-12-09: qty 1

## 2021-12-09 MED ORDER — LIDOCAINE 2% (20 MG/ML) 5 ML SYRINGE
INTRAMUSCULAR | Status: AC
  Filled 2021-12-09: qty 5

## 2021-12-09 MED ORDER — ROCURONIUM BROMIDE 10 MG/ML (PF) SYRINGE
PREFILLED_SYRINGE | INTRAVENOUS | Status: DC | PRN
  Administered 2021-12-09: 20 mg via INTRAVENOUS
  Administered 2021-12-09: 50 mg via INTRAVENOUS
  Administered 2021-12-09: 20 mg via INTRAVENOUS

## 2021-12-09 MED ORDER — OXYCODONE HCL 5 MG/5ML PO SOLN: 5.0000 mg | Freq: Once | ORAL | Status: DC | PRN

## 2021-12-09 MED ORDER — EPHEDRINE 5 MG/ML INJ
INTRAVENOUS | Status: AC
  Filled 2021-12-09: qty 5

## 2021-12-09 MED ORDER — FENTANYL CITRATE (PF) 100 MCG/2ML IJ SOLN: 50.0000 ug | Freq: Once | INTRAMUSCULAR | Status: AC

## 2021-12-09 MED ORDER — DEXAMETHASONE SODIUM PHOSPHATE 10 MG/ML IJ SOLN
INTRAMUSCULAR | Status: DC | PRN
  Administered 2021-12-09: 5 mg via INTRAVENOUS

## 2021-12-09 MED ORDER — DEXAMETHASONE SODIUM PHOSPHATE 10 MG/ML IJ SOLN
INTRAMUSCULAR | Status: AC
  Filled 2021-12-09: qty 1

## 2021-12-09 MED ORDER — ENSURE ENLIVE PO LIQD
237.0000 mL | Freq: Two times a day (BID) | ORAL | Status: DC
  Administered 2021-12-11 – 2021-12-12 (×3): 237 mL via ORAL

## 2021-12-09 MED ORDER — CEFAZOLIN SODIUM-DEXTROSE 2-4 GM/100ML-% IV SOLN
2.0000 g | INTRAVENOUS | Status: AC
  Administered 2021-12-09: 2 g via INTRAVENOUS
  Filled 2021-12-09: qty 100

## 2021-12-09 MED ORDER — ONDANSETRON HCL 4 MG/2ML IJ SOLN
INTRAMUSCULAR | Status: DC | PRN
  Administered 2021-12-09: 4 mg via INTRAVENOUS

## 2021-12-09 MED ORDER — MIDAZOLAM HCL 2 MG/2ML IJ SOLN: 1.0000 mg | Freq: Once | INTRAMUSCULAR | Status: AC

## 2021-12-09 MED ORDER — OXYCODONE HCL 5 MG PO TABS: 5.0000 mg | ORAL_TABLET | Freq: Once | ORAL | Status: DC | PRN

## 2021-12-09 MED ORDER — POVIDONE-IODINE 10 % EX SWAB
2.0000 "application " | Freq: Once | CUTANEOUS | Status: AC
  Administered 2021-12-09: 2 via TOPICAL

## 2021-12-09 MED ORDER — KETOROLAC TROMETHAMINE 15 MG/ML IJ SOLN
15.0000 mg | Freq: Once | INTRAMUSCULAR | Status: AC
  Administered 2021-12-09: 15 mg via INTRAVENOUS
  Filled 2021-12-09: qty 1

## 2021-12-09 MED ORDER — LACTATED RINGERS IV SOLN: INTRAVENOUS | Status: DC

## 2021-12-09 MED ORDER — PROPOFOL 10 MG/ML IV BOLUS
INTRAVENOUS | Status: DC | PRN
  Administered 2021-12-09: 140 mg via INTRAVENOUS

## 2021-12-09 MED ORDER — ROPIVACAINE HCL 5 MG/ML IJ SOLN
INTRAMUSCULAR | Status: DC | PRN
  Administered 2021-12-09: 30 mL via PERINEURAL

## 2021-12-09 MED ORDER — CEFAZOLIN SODIUM-DEXTROSE 2-4 GM/100ML-% IV SOLN
2.0000 g | Freq: Three times a day (TID) | INTRAVENOUS | Status: AC
  Administered 2021-12-10 (×2): 2 g via INTRAVENOUS
  Filled 2021-12-09 (×2): qty 100

## 2021-12-09 MED ORDER — ACETAMINOPHEN 500 MG PO TABS
1000.0000 mg | ORAL_TABLET | Freq: Once | ORAL | Status: AC
Start: 2021-12-09 — End: 2021-12-09
  Administered 2021-12-09: 1000 mg via ORAL
  Filled 2021-12-09: qty 2

## 2021-12-09 MED ORDER — ONDANSETRON HCL 4 MG/2ML IJ SOLN
INTRAMUSCULAR | Status: AC
  Filled 2021-12-09: qty 2

## 2021-12-09 MED ORDER — ORAL CARE MOUTH RINSE: 15.0000 mL | Freq: Once | OROMUCOSAL | Status: AC

## 2021-12-09 MED ORDER — FENTANYL CITRATE (PF) 250 MCG/5ML IJ SOLN
INTRAMUSCULAR | Status: DC | PRN
  Administered 2021-12-09 (×2): 50 ug via INTRAVENOUS

## 2021-12-09 MED ORDER — ROCURONIUM BROMIDE 10 MG/ML (PF) SYRINGE
PREFILLED_SYRINGE | INTRAVENOUS | Status: AC
  Filled 2021-12-09: qty 10

## 2021-12-09 MED ORDER — SUGAMMADEX SODIUM 200 MG/2ML IV SOLN
INTRAVENOUS | Status: DC | PRN
  Administered 2021-12-09: 200 mg via INTRAVENOUS

## 2021-12-09 MED ORDER — FENTANYL CITRATE (PF) 250 MCG/5ML IJ SOLN
INTRAMUSCULAR | Status: AC
  Filled 2021-12-09: qty 5

## 2021-12-09 MED ORDER — FENTANYL CITRATE (PF) 100 MCG/2ML IJ SOLN
INTRAMUSCULAR | Status: AC
  Administered 2021-12-09: 50 ug via INTRAVENOUS
  Filled 2021-12-09: qty 2

## 2021-12-09 SURGICAL SUPPLY — 84 items
BAG COUNTER SPONGE SURGICOUNT (BAG) ×4 IMPLANT
BAG SPNG CNTER NS LX DISP (BAG) ×2
BIT DRILL QC 2.0 SHORT EVOS SM (DRILL) IMPLANT
BIT DRILL QC 2.5MM SHRT EVO SM (DRILL) IMPLANT
BLADE SURG 15 STRL LF DISP TIS (BLADE) IMPLANT
BLADE SURG 15 STRL SS (BLADE) ×3
BNDG CMPR 9X4 STRL LF SNTH (GAUZE/BANDAGES/DRESSINGS) ×2
BNDG ELASTIC 4X5.8 VLCR STR LF (GAUZE/BANDAGES/DRESSINGS) IMPLANT
BNDG ESMARK 4X9 LF (GAUZE/BANDAGES/DRESSINGS) ×4 IMPLANT
BNDG GAUZE ELAST 4 BULKY (GAUZE/BANDAGES/DRESSINGS) ×1 IMPLANT
COVER SURGICAL LIGHT HANDLE (MISCELLANEOUS) ×8 IMPLANT
CUFF TOURN SGL QUICK 18X4 (TOURNIQUET CUFF) ×4 IMPLANT
CUFF TOURN SGL QUICK 24 (TOURNIQUET CUFF)
CUFF TRNQT CYL 24X4X16.5-23 (TOURNIQUET CUFF) IMPLANT
DRAPE INCISE IOBAN 66X45 STRL (DRAPES) IMPLANT
DRAPE OEC MINIVIEW 54X84 (DRAPES) ×4 IMPLANT
DRAPE U-SHAPE 47X51 STRL (DRAPES) ×4 IMPLANT
DRILL QC 2.0 SHORT EVOS SM (DRILL) ×3
DRILL QC 2.5MM SHORT EVOS SM (DRILL) ×3
DRSG EMULSION OIL 3X3 NADH (GAUZE/BANDAGES/DRESSINGS) IMPLANT
DRSG PAD ABDOMINAL 8X10 ST (GAUZE/BANDAGES/DRESSINGS) IMPLANT
DURAPREP 26ML APPLICATOR (WOUND CARE) ×4 IMPLANT
ELECT REM PT RETURN 9FT ADLT (ELECTROSURGICAL) ×3
ELECTRODE REM PT RTRN 9FT ADLT (ELECTROSURGICAL) ×3 IMPLANT
GAUZE 4X4 16PLY ~~LOC~~+RFID DBL (SPONGE) ×4 IMPLANT
GAUZE SPONGE 4X4 12PLY STRL (GAUZE/BANDAGES/DRESSINGS) ×1 IMPLANT
GAUZE XEROFORM 5X9 LF (GAUZE/BANDAGES/DRESSINGS) ×1 IMPLANT
GLOVE BIOGEL PI IND STRL 9 (GLOVE) ×3 IMPLANT
GLOVE BIOGEL PI INDICATOR 9 (GLOVE) ×1
GLOVE SURG ORTHO 9.0 STRL STRW (GLOVE) ×4 IMPLANT
GOWN STRL REUS W/ TWL XL LVL3 (GOWN DISPOSABLE) ×6 IMPLANT
GOWN STRL REUS W/TWL XL LVL3 (GOWN DISPOSABLE) ×6
K-WIRE 1.6 (WIRE) ×6
K-WIRE FX150X1.6XTROC PNT (WIRE) ×4
KIT BASIN OR (CUSTOM PROCEDURE TRAY) ×4 IMPLANT
KIT TURNOVER KIT B (KITS) ×4 IMPLANT
KWIRE FX150X1.6XTROC PNT (WIRE) IMPLANT
MANIFOLD NEPTUNE II (INSTRUMENTS) ×4 IMPLANT
NDL 1/2 CIR MAYO (NEEDLE) IMPLANT
NDL SUT .5 MAYO 1.404X.05X (NEEDLE) IMPLANT
NEEDLE 1/2 CIR MAYO (NEEDLE) ×3 IMPLANT
NEEDLE MAYO TAPER (NEEDLE) ×3
NS IRRIG 1000ML POUR BTL (IV SOLUTION) ×4 IMPLANT
PACK ORTHO EXTREMITY (CUSTOM PROCEDURE TRAY) ×4 IMPLANT
PAD ARMBOARD 7.5X6 YLW CONV (MISCELLANEOUS) ×8 IMPLANT
PAD CAST 4YDX4 CTTN HI CHSV (CAST SUPPLIES) IMPLANT
PADDING CAST COTTON 4X4 STRL (CAST SUPPLIES) ×3
PENCIL BUTTON HOLSTER BLD 10FT (ELECTRODE) ×1 IMPLANT
PLATE OLECR EVOS 2.7X114 8H RT (Plate) ×1 IMPLANT
SCREW CANC 4.0X18 (Screw) ×1 IMPLANT
SCREW CORT 2.7X12 EVOS (Screw) IMPLANT
SCREW CORT 2.7X14 T8 EVOS (Screw) ×2 IMPLANT
SCREW CORT 3.5X14 ST EVOS (Screw) ×1 IMPLANT
SCREW CORT 3.5X30 ST EVOS (Screw) ×1 IMPLANT
SCREW CORT EVOS ST 3.5X28 (Screw) ×1 IMPLANT
SCREW CORT EVOS ST T8 2.7X14MM (Screw) ×3 IMPLANT
SCREW CORT ST EVOS 2.7X14 (Screw) ×1 IMPLANT
SCREW CORT VA EVOS 2.7X48 (Screw) ×1 IMPLANT
SCREW CORTEX 3.5X18 EVOS (Screw) ×3 IMPLANT
SCREW EVOS 2.7X18 LOCK T8 (Screw) ×1 IMPLANT
SPONGE T-LAP 18X18 ~~LOC~~+RFID (SPONGE) ×9 IMPLANT
STAPLER VISISTAT 35W (STAPLE) ×4 IMPLANT
STRIP CLOSURE SKIN 1/2X4 (GAUZE/BANDAGES/DRESSINGS) ×4 IMPLANT
SUCTION FRAZIER HANDLE 10FR (MISCELLANEOUS) ×3
SUCTION TUBE FRAZIER 10FR DISP (MISCELLANEOUS) ×3 IMPLANT
SUT ETHILON 3 0 PS 1 (SUTURE) ×2 IMPLANT
SUT FIBERWIRE #2 38 T-5 BLUE (SUTURE) ×3
SUT PROLENE 3 0 PS 1 (SUTURE) ×3 IMPLANT
SUT STEEL 5 (SUTURE) IMPLANT
SUT VIC AB 0 CT1 27 (SUTURE) ×6
SUT VIC AB 0 CT1 27XBRD ANBCTR (SUTURE) IMPLANT
SUT VIC AB 2-0 CT1 27 (SUTURE) ×6
SUT VIC AB 2-0 CT1 TAPERPNT 27 (SUTURE) IMPLANT
SUT VIC AB 2-0 CTB1 (SUTURE) ×1 IMPLANT
SUT VIC AB 3-0 X1 27 (SUTURE) ×5 IMPLANT
SUT VICRYL 4-0 PS2 18IN ABS (SUTURE) IMPLANT
SUTURE FIBERWR #2 38 T-5 BLUE (SUTURE) IMPLANT
SYR BULB EAR ULCER 3OZ GRN STR (SYRINGE) ×4 IMPLANT
TAPE PAPER 3X10 WHT MICROPORE (GAUZE/BANDAGES/DRESSINGS) ×1 IMPLANT
TOWEL GREEN STERILE (TOWEL DISPOSABLE) ×4 IMPLANT
TOWEL GREEN STERILE FF (TOWEL DISPOSABLE) ×4 IMPLANT
TUBE CONNECTING 12X1/4 (SUCTIONS) ×4 IMPLANT
WATER STERILE IRR 1000ML POUR (IV SOLUTION) ×4 IMPLANT
YANKAUER SUCT BULB TIP NO VENT (SUCTIONS) ×4 IMPLANT

## 2021-12-09 NOTE — H&P (View-Only) (Signed)
? ?  Subjective: ?Patient reports pain is well controlled this morning.  She continues to have worsening left knee pain and swelling.  Unable to tolerate examination this morning.  She is n.p.o. for operating room today. ? ?Objective:  ? ?VITALS:   ?Vitals:  ? 12/08/21 1004 12/08/21 1300 12/08/21 2034 12/09/21 0434  ?BP: (!) 124/94 120/68 (!) 145/78 (!) 119/55  ?Pulse: 79 66 66 65  ?Resp: 18 17 18 17  ?Temp:  99 ?F (37.2 ?C) 98 ?F (36.7 ?C) 98.6 ?F (37 ?C)  ?TempSrc:  Oral    ?SpO2: 94% 98% 97% 94%  ?Weight:      ? ?MUSCULOSKELETAL:  ?Right elbow is in a splint.  She is able to fire EPL as well as wrist extensors and flexors.  Fires interosseous nerve.  Sensation is intact in all distributions of the right hand.  2+ radial pulse. ?  ?Left elbow she has range of motion from 0-90.  This is otherwise painful past 90.  She has full pro supination.  2+ radial pulse.  Sensation is intact distally.  Tender about the radial head. ?  ?Left knee with a tense effusion.  She has pain with any range of motion.  Able to fire EHL as well as tibialis anterior and gastrocsoleus.  2+ dorsalis pedis pulse.  She is not able to tolerate any ligamentous testing of the knee. ?  ? ?Lab Results  ?Component Value Date  ? WBC 10.7 (H) 12/07/2021  ? HGB 12.2 12/07/2021  ? HCT 36.4 12/07/2021  ? MCV 94.8 12/07/2021  ? PLT 174 12/07/2021  ? ? ? ?Assessment/Plan: ? ?60-year-old female with a right comminuted olecranon fracture, left nondisplaced radial head fracture, left knee hemarthrosis concerning for ligamentous knee injury. ? ?-N.p.o. for operating room today ?-Plan for operating room for right elbow olecranon open reduction internal fixation as well as left knee aspiration with exam under anesthesia ? ?Kaleb Sek ?12/09/2021, 8:09 AM ? ?

## 2021-12-09 NOTE — Transfer of Care (Signed)
Immediate Anesthesia Transfer of Care Note ? ?Patient: Alexandra Parsons ? ?Procedure(s) Performed: OPEN REDUCTION INTERNAL FIXATION (ORIF) ELBOW/OLECRANON FRACTURE (Right: Elbow) ?LEFT KNEE  NEEDLE ASPIRATION (Left: Knee) ? ?Patient Location: PACU ? ?Anesthesia Type:General and Regional ? ?Level of Consciousness: awake and alert  ? ?Airway & Oxygen Therapy: Patient Spontanous Breathing and Patient connected to face mask oxygen ? ?Post-op Assessment: Report given to RN and Post -op Vital signs reviewed and stable ? ?Post vital signs: Reviewed and stable ? ?Last Vitals:  ?Vitals Value Taken Time  ?BP 152/68 12/09/21 1738  ?Temp    ?Pulse 69 12/09/21 1742  ?Resp 18 12/09/21 1742  ?SpO2 94 % 12/09/21 1742  ?Vitals shown include unvalidated device data. ? ?Last Pain:  ?Vitals:  ? 12/09/21 1348  ?TempSrc:   ?PainSc: 0-No pain  ?   ? ?Patients Stated Pain Goal: 1 (12/09/21 1149) ? ?Complications: No notable events documented. ?

## 2021-12-09 NOTE — Anesthesia Procedure Notes (Signed)
Procedure Name: Intubation ?Date/Time: 12/09/2021 3:08 PM ?Performed by: Reece Agar, CRNA ?Pre-anesthesia Checklist: Patient identified, Emergency Drugs available, Suction available and Patient being monitored ?Patient Re-evaluated:Patient Re-evaluated prior to induction ?Oxygen Delivery Method: Circle System Utilized ?Preoxygenation: Pre-oxygenation with 100% oxygen ?Induction Type: IV induction ?Ventilation: Mask ventilation without difficulty ?Laryngoscope Size: Mac and 3 ?Grade View: Grade I ?Tube type: Oral ?Tube size: 7.0 mm ?Number of attempts: 1 ?Airway Equipment and Method: Stylet ?Placement Confirmation: ETT inserted through vocal cords under direct vision, positive ETCO2 and breath sounds checked- equal and bilateral ?Secured at: 22 cm ?Tube secured with: Tape ?Dental Injury: Teeth and Oropharynx as per pre-operative assessment  ? ? ? ? ?

## 2021-12-09 NOTE — Progress Notes (Signed)
Initial Nutrition Assessment ? ?DOCUMENTATION CODES:  ? ?Not applicable ? ?INTERVENTION:  ?Once diet advances, provide Ensure Enlive po BID, each supplement provides 350 kcal and 20 grams of protein. ? ?NUTRITION DIAGNOSIS:  ? ?Increased nutrient needs related to post-op healing as evidenced by estimated needs. ? ?GOAL:  ? ?Patient will meet greater than or equal to 90% of their needs ? ?MONITOR:  ? ?PO intake, Supplement acceptance, Labs, Weight trends, Skin, I & O's ? ?REASON FOR ASSESSMENT:  ? ?Consult ?Hip fracture protocol ? ?ASSESSMENT:  ? ?61 y.o. female with PMH of DM presents with bilateral elbow pain as well as left knee pain after fall. Pt with comminuted right olecranon fracture with displacement, nondisplaced left radial head fracture, effusion about the left knee without fracture. ? ?5/2 - s/p R ORIF elbow/olecranon, left knee aspiration ? ?Pt currently NPO for procedure today. Pt reports usual intake of 1-2 meals a day, however reports having a good appetite. Pt does report having difficulties consuming animal protein meats due to chewing/teeth difficulties. Pt with no significant weight loss per weight records. RD to order nutritional supplements to aid in caloric and protein needs as well as in post op healing. RN to provide nutritional supplements once diet advances post op as appropriate.  ? ?NUTRITION - FOCUSED PHYSICAL EXAM: ? ?Flowsheet Row Most Recent Value  ?Orbital Region No depletion  ?Upper Arm Region No depletion  ?Thoracic and Lumbar Region No depletion  ?Buccal Region Unable to assess  ?Temple Region Unable to assess  ?Clavicle Bone Region No depletion  ?Clavicle and Acromion Bone Region No depletion  ?Scapular Bone Region Unable to assess  ?Dorsal Hand Unable to assess  ?Patellar Region No depletion  ?Anterior Thigh Region No depletion  ?Posterior Calf Region No depletion  ?Edema (RD Assessment) None  ?Hair Reviewed  ?Eyes Reviewed  ?Mouth Reviewed  ?Skin Reviewed  ?Nails Unable to  assess  ? ?  ? ?Labs and medications reviewed.  ? ?Diet Order:   ?Diet Order   ? ?       ?  Diet NPO time specified  Diet effective midnight       ?  ? ?  ?  ? ?  ? ? ?EDUCATION NEEDS:  ? ?Not appropriate for education at this time ? ?Skin:  Skin Assessment: Skin Integrity Issues: ?Skin Integrity Issues:: Other (Comment) ?Other: abrasion edema L knee ? ?Last BM:  4/30 ? ?Height:  ? ?Ht Readings from Last 1 Encounters:  ?10/23/21 5\' 7"  (1.702 m)  ? ? ?Weight:  ? ?Wt Readings from Last 1 Encounters:  ?12/07/21 77.6 kg  ? ?BMI:  Body mass index is 26.78 kg/m?. ? ?Estimated Nutritional Needs:  ? ?Kcal:  1900-2100 ? ?Protein:  90-105 grams ? ?Fluid:  >/= 1.9 L/day ? ?12/09/21, MS, RD, LDN ?RD pager number/after hours weekend pager number on Am[employer]. ? ?

## 2021-12-09 NOTE — Brief Op Note (Signed)
? ?  Brief Op Note ? ?Date of Surgery: ?12/09/2021 ? ?Preoperative Diagnosis: ?Fractured Elbow Right ? ?Postoperative Diagnosis: ?same ? ?Procedure: ?Procedure(s): ?OPEN REDUCTION INTERNAL FIXATION (ORIF) ELBOW/OLECRANON FRACTURE ?LEFT KNEE  NEEDLE ASPIRATION ? ?Implants: ?Implant Name Type Inv. Item Serial No. Manufacturer Lot No. LRB No. Used Action  ?SMITH NEPHEWS 8 HOLE RIGHT OLECRANON PLATE    [employer]  Right 1 Implanted  ?SCREW CORTEX 3.5X18 EVOS - [id number] Screw SCREW CORTEX 3.5X18 EVOS  [employer]  Right 3 Implanted  ?SCREW CORT 2.7X14 T8 EVOS - [id number] Screw SCREW CORT 2.7X14 T8 EVOS  [employer]  Right 1 Implanted  ?SCREW EVOS 2.7X18 LOCK T8 - [id number] Screw SCREW EVOS 2.7X18 LOCK T8  [employer]  Right 1 Implanted  ?SCREW CANC 4.0X18 - [id number] Screw SCREW CANC 4.0X18  [employer]  Right 1 Implanted  ?SCREW CORT VA EVOS 2.7X48 - [id number] Screw SCREW CORT VA EVOS 2.7X48  [employer]  Right 1 Implanted  ?SCREW CORT EVOS ST T8 2.7X14MM - [id number] Screw SCREW CORT EVOS ST T8 2.7X14MM  [employer]  Right 1 Implanted  ?SCREW CORT EVOS ST 3.5X28 - [id number] Screw SCREW CORT EVOS ST 3.5X28  [employer]  Right 1 Implanted  ? ? ?Surgeons: ?Surgeon(s): ?Huel Cote, MD ? ?Anesthesia: ?General ? ? ? ?Estimated Blood Loss: ?See anesthesia record ? ?Complications: ?None ? ?Condition to PACU: ?Stable ? ?Benancio Deeds, MD ?12/09/2021 ?5:28 PM ? ?

## 2021-12-09 NOTE — Op Note (Signed)
? ?Date of Surgery: 12/09/2021 ? ?INDICATIONS: Alexandra Parsons is a 61 y.o.-year-old female with a displaced right olecranon fracture in the setting of polytrauma.  She also has a left knee effusion with inability to bear weight.  She has not tolerated left knee examination.  The risk and benefits of the procedure were discussed in detail and documented in the pre-operative evaluation.  ? ?PREOPERATIVE DIAGNOSIS: 1.  Right displaced olecranon fracture ?2.  Left nondisplaced radial head fracture ?3.  Left knee instability with hemarthrosis ? ?POSTOPERATIVE DIAGNOSIS: Same. ? ?PROCEDURE: 1.  Open reduction internal fixation right olecranon ?2.  Closed management of left radial head fracture ?3.  Exam under anesthesia with aspiration of left knee ? ?SURGEON: Benancio Deeds MD ? ?ASSISTANT: Kerby Less, ATC ? ?ANESTHESIA:  general ? ?IV FLUIDS AND URINE: See anesthesia record. ? ?ANTIBIOTICS: Ancef 2 g ? ?ESTIMATED BLOOD LOSS: 25 mL. ? ?IMPLANTS:  ?Implant Name Type Inv. Item Serial No. Manufacturer Lot No. LRB No. Used Action  ?SMITH NEPHEWS 8 HOLE RIGHT OLECRANON PLATE    [employer]  Right 1 Implanted  ?SCREW CORTEX 3.5X18 EVOS - [id number] Screw SCREW CORTEX 3.5X18 EVOS  [employer]  Right 3 Implanted  ?SCREW CORT 2.7X14 T8 EVOS - [id number] Screw SCREW CORT 2.7X14 T8 EVOS  [employer]  Right 1 Implanted  ?SCREW EVOS 2.7X18 LOCK T8 - [id number] Screw SCREW EVOS 2.7X18 LOCK T8  [employer]  Right 1 Implanted  ?SCREW CANC 4.0X18 - [id number] Screw SCREW CANC 4.0X18  [employer]  Right 1 Implanted  ?SCREW CORT VA EVOS 2.7X48 - [id number] Screw SCREW CORT VA EVOS 2.7X48  [employer]  Right 1 Implanted  ?SCREW CORT EVOS ST T8 2.7X14MM - [id number] Screw SCREW CORT EVOS ST T8 2.7X14MM  [employer]  Right 1 Implanted  ?SCREW CORT EVOS ST 3.5X28 - [id number] Screw SCREW CORT EVOS ST 3.5X28  [employer]  ORTHOPED[employer]  Right 1 Implanted  ? ? ?DRAINS: None ? ?CULTURES: None ? ?COMPLICATIONS: none ? ?DESCRIPTION OF PROCEDURE:  ?The patient was identified in the preoperative holding area.  The correct sites were marked according to universal protocol.  She was subsequently taken back to the operating room.  At this time anesthesia was induced.  Appropriate antibiotics were given 1 hour prior to skin incision.  A timeout was performed.  At this time the left knee was sterilized with a chlorhexidine swab and an aspiration was performed from the superior lateral pouch.  There was approximately 80 cc of bloody return.  This was milked out of the joint.  At this time an examination of the left knee was performed.  This was significant for positive posterior drawer consistent with a posterior cruciate ligament injury. ? ? ?At this time she was positioned into the lateral position.  All bony prominences were padded.  The arm was prepped and draped in the usual sterile fashion.  Again another timeout was performed with regard to the right elbow.  Attention was turned to the olecranon.  A midline incision was made centered from the proximal aspect of olecranon distal to the fracture.  This was identified on fluoroscopy.  Electrocautery was used to achieve hemostasis.  This was performed down to the level of the bone.  The olecranon fracture was identified and found to be quite comminuted with very poor quality bone.  At this time the fracture was provisionally reduced  with a K wire as well as some 0 Vicryl in a figure-of-eight fashion.  A plate of the appropriate length was packed and placed on the bone.  This was found to be centered on AP and lateral fluoroscopy.  A screw was placed in the oblong hole and this was loosened while the plate was pushed distal in order to reduce the plate down to bone and to help compress the fracture.  At this time I was satisfied with plate position.  The reduction was deemed to be near-anatomic.   At this time 2 additional screws were placed proximal in a nonlocking fashion.  This brought the plate nicely down to bone.  An additional 2 locking screws were placed in the proximal ulna and a home brown screw was placed under direct fluoroscopy.  Finally 3 additional shaft screws were then placed with good purchase.  At this time AP and lateral fluoroscopy showed an anatomic reduction.  There was full pro supination as well as flexion and extension of the arm.  The wound was thoroughly irrigated.  At this time a Krak?w suture was placed across the medial and lateral aspect of the triceps.  This was then brought through the K wire holes in the plate in order to attach the triceps mechanism to the plate.  This was done in order to prevent failure of the proximal segment of the ulna.  This showed excellent reduction of the triceps.  The wound was again irrigated and closed in layers of 0 Vicryl 2-0 Vicryl and 3-0 nylon in a running trauma stitch fashion.  Xeroform gauze Kerlix web roll and a posterior slab splint were placed with the arm in 90 degrees in neutral rotation.  All counts were correct at the end of the case.  She was taken to the PACU without complication.  She was placed in a sling in terms of the left arm for nonoperative management of her proximal radius fracture. ? ? ? ?POSTOPERATIVE PLAN: She will be nonweightbearing on the right upper extremity for a total of 2 weeks.  At that time we will plan to transition her to a brace and increase her range of motion.  She may continue to use the left elbow as tolerated although I would like her in a sling for comfort when at rest.  I will plan to obtain an MRI of the left knee given her ligamentous findings on exam under anesthesia.  She will be seen by PT and mobilized accordingly. ? ?Benancio Deeds, MD ?5:38 PM ? ? ? ?

## 2021-12-09 NOTE — Interval H&P Note (Signed)
History and Physical Interval Note: ? ?12/09/2021 ?1:17 PM ? ?Alexandra Parsons  has presented today for surgery, with the diagnosis of Fractured Elbow Right.  The various methods of treatment have been discussed with the patient and family. After consideration of risks, benefits and other options for treatment, the patient has consented to  Procedure(s): ?OPEN REDUCTION INTERNAL FIXATION (ORIF) ELBOW/OLECRANON FRACTURE (Right), left knee aspiration with exam under anesthesia as a surgical intervention.  The patient's history has been reviewed, patient examined, no change in status, stable for surgery.  I have reviewed the patient's chart and labs.  Questions were answered to the patient's satisfaction.   ? ? ?Huel Cote ? ? ?

## 2021-12-09 NOTE — Progress Notes (Signed)
? ?  Subjective: ?Patient reports pain is well controlled this morning.  She continues to have worsening left knee pain and swelling.  Unable to tolerate examination this morning.  She is n.p.o. for operating room today. ? ?Objective:  ? ?VITALS:   ?Vitals:  ? 12/08/21 1004 12/08/21 1300 12/08/21 2034 12/09/21 0434  ?BP: (!) 124/94 120/68 (!) 145/78 (!) 119/55  ?Pulse: 79 66 66 65  ?Resp: 18 17 18 17   ?Temp:  99 ?F (37.2 ?C) 98 ?F (36.7 ?C) 98.6 ?F (37 ?C)  ?TempSrc:  Oral    ?SpO2: 94% 98% 97% 94%  ?Weight:      ? ?MUSCULOSKELETAL:  ?Right elbow is in a splint.  She is able to fire EPL as well as wrist extensors and flexors.  Fires interosseous nerve.  Sensation is intact in all distributions of the right hand.  2+ radial pulse. ?  ?Left elbow she has range of motion from 0-90.  This is otherwise painful past 90.  She has full pro supination.  2+ radial pulse.  Sensation is intact distally.  Tender about the radial head. ?  ?Left knee with a tense effusion.  She has pain with any range of motion.  Able to fire EHL as well as tibialis anterior and gastrocsoleus.  2+ dorsalis pedis pulse.  She is not able to tolerate any ligamentous testing of the knee. ?  ? ?Lab Results  ?Component Value Date  ? WBC 10.7 (H) 12/07/2021  ? HGB 12.2 12/07/2021  ? HCT 36.4 12/07/2021  ? MCV 94.8 12/07/2021  ? PLT 174 12/07/2021  ? ? ? ?Assessment/Plan: ? ?61 year old female with a right comminuted olecranon fracture, left nondisplaced radial head fracture, left knee hemarthrosis concerning for ligamentous knee injury. ? ?-N.p.o. for operating room today ?-Plan for operating room for right elbow olecranon open reduction internal fixation as well as left knee aspiration with exam under anesthesia ? ?Rinda Rollyson ?12/09/2021, 8:09 AM ? ?

## 2021-12-09 NOTE — Progress Notes (Signed)
Inpatient Rehab Admissions Coordinator:  ? ?CIR consult received. PT/OT have not yet evaluated pt., so I am not able to make an assessment of candidacy at this time. I will follow up once evaluations are complete.  ? ?Megan Salon, MS, CCC-SLP ?Rehab Admissions Coordinator  ?[phone] (celll) ?[phone] (office) ? ?

## 2021-12-09 NOTE — Anesthesia Procedure Notes (Signed)
Anesthesia Regional Block: Supraclavicular block  ? ?Pre-Anesthetic Checklist: , timeout performed,  Correct Patient, Correct Site, Correct Laterality,  Correct Procedure, Correct Position, site marked,  Risks and benefits discussed,  Surgical consent,  Pre-op evaluation,  At surgeon's request and post-op pain management ? ?Laterality: Right ? ?Prep: chloraprep     ?  ?Needles:  ?Injection technique: Single-shot ? ?Needle Type: Echogenic Needle   ? ? ?Needle Length: 9cm  ?Needle Gauge: 21  ? ? ? ?Additional Needles: ? ? ?Procedures:,,,, ultrasound used (permanent image in chart),,    ?Narrative:  ?Start time: 12/09/2021 1:24 PM ?End time: 12/09/2021 1:30 PM ?Injection made incrementally with aspirations every 5 mL. ? ?Performed by: Personally  ?Anesthesiologist: Collene Schlichter, MD ? ?Additional Notes: ?No pain on injection. No increased resistance to injection. Injection made in 5cc increments.  Good needle visualization.  Patient tolerated procedure well. ? ? ? ? ?

## 2021-12-09 NOTE — Progress Notes (Signed)
?PROGRESS NOTE ? ? ? ?Alexandra Parsons  JEH:[id number] DOB: Jun 11, 1961 DOA: 12/07/2021 ?PCP: Wanda Plump, MD  ? ? ?Brief Narrative:  ? ?Alexandra Parsons is a 61 year old female with past medical history significant for type 2 diabetes mellitus, HTN, HLD, chronic pain, anxiety/depression, who presented to [facility] ED on 4/30 with complaint of bilateral arm pain and left knee pain.  She reports she was walking across a flat concrete surface when her shoe caught on the ground causing her to fall forward.  She was carrying objects in her hands at that time and when she fell she strike to both knees and elbows.  Denies loss of consciousness and did not feel lightheaded or dizzy at time of event.  She reports a previous fall roughly 3 weeks ago in which she did not get injured and attributes this to her vision and likely needs to see an eye doctor.  Patient received Dilaudid in the ED with a transient episode of hypoxia afterwards. ? ?In the ED, temperature 98.5 ?F, HR 72, RR 18, BP 160/77, SPO2 100% on room air.  WBC 10.7, hemoglobin 12.2, platelets 174.  Sodium 137, potassium 4.3, chloride 103, CO2 28, glucose 126, BUN 14, creatinine 0.69.  Magnesium 1.8.  Left elbow x-ray with nondisplaced intra-articular fracture left radial head with associated moderate elbow joint effusion.  Right elbow with severely comminuted, slightly displaced fracture proximal right ulna.  Left knee x-ray with no fracture/dislocation with moderate to large effusion possible hemarthrosis in the suprapatellar bursa.  Orthopedics was consulted.  Patient was transferred to [facility] for surgical intervention.  Hospitalist service consulted for admission. ? ?Assessment & Plan: ?  ? ?Right comminuted olecranon fracture ?Left nondisplaced radial head fracture ?Left knee hemarthrosis ?Patient presenting to ED following mechanical fall with pain to bilateral upper extremities and left knee. Left elbow x-ray with nondisplaced  intra-articular fracture left radial head with associated moderate elbow joint effusion.  Right elbow with severely comminuted, slightly displaced fracture proximal right ulna.  Left knee x-ray with no fracture/dislocation with moderate to large effusion possible hemarthrosis in the suprapatellar bursa.  ?--Orthopedics following, appreciate assistance ?--Oxycodone 5-10 mg PO q4h PRN moderate pain ?--Morphine 2 mg IV q2h PRN severe pain ?--Robaxin as needed muscle spasms ?--N.p.o. for planned ORIF right elbow olecranon fracture and left knee aspiration today by orthopedics ? ?Type 2 diabetes mellitus ?Recent hemoglobin A1c, 6.2, well controlled.  Home regimen includes metformin 1000 mg p.o. twice daily, pioglitazone 30 mg p.o. daily, sitagliptin 100 mg p.o. daily. ?--Holding oral hypoglycemics while inpatient ?--Moderate SSI for coverage ?--CBGs qAC/HS ? ?Essential hypertension ?--Continue bisoprolol-HCTZ 2.5-6.25 mg p.o. daily ?--Continue aspirin and statin ? ?Hyperlipidemia ?--Zetia 10mg  PO daily ?--Crestor 10mg  PO daily ? ?Anxiety/depression: ?--Lexapro 20 mg p.o. daily ? ?Nicotine use disorder ?Counseled on need for cessation. ?--Nicotine patch ? ? ?DVT prophylaxis: SCDs Start: 12/08/21 1350 ? ?  Code Status: DNR ?Family Communication:  ? ?Disposition Plan:  ?Level of care: Med-Surg ?Status is: Inpatient ?Remains inpatient appropriate because: Pending surgical intervention for right olecranon fracture ?  ? ?Consultants:  ?Orthopedics, Dr. ? ?Procedures:  ?None ? ?Antimicrobials:  ?None ? ? ?Subjective: ?Patient seen examined bedside, resting comfortably.  Awaiting surgical intervention for left olecranon fracture and left knee joint aspiration by orthopedics in the OR today.  No other specific questions or concerns at this time.  Denies headache, no dizziness, no visual changes, no chest pain, no palpitations, no shortness of breath, no  abdominal pain, no nausea/vomiting/diarrhea, no fever/chills/night  sweats, no weakness, no fatigue, no paresthesias.  No acute events overnight per nurse staff. ? ?Objective: ?Vitals:  ? 12/08/21 1300 12/08/21 2034 12/09/21 0434 12/09/21 0823  ?BP: 120/68 (!) 145/78 (!) 119/55 (!) 113/59  ?Pulse: 66 66 65 60  ?Resp: 17 18 17 20   ?Temp: 99 ?F (37.2 ?C) 98 ?F (36.7 ?C) 98.6 ?F (37 ?C) 98.6 ?F (37 ?C)  ?TempSrc: Oral   Oral  ?SpO2: 98% 97% 94% 95%  ?Weight:      ? ? ?Intake/Output Summary (Last 24 hours) at 12/09/2021 1104 ?Last data filed at 12/09/2021 02/08/2022 ?Gross per 24 hour  ?Intake 1964.57 ml  ?Output 600 ml  ?Net 1364.57 ml  ? ?Filed Weights  ? 12/07/21 1621  ?Weight: 77.6 kg  ? ? ?Examination: ? ?Physical Exam: ?GEN: NAD, alert and oriented x 3, wd/wn ?HEENT: NCAT, PERRL, EOMI, sclera clear, MMM ?PULM: CTAB w/o wheezes/crackles, normal respiratory effort, on room air ?CV: RRR w/o M/G/R ?GI: abd soft, NTND, NABS, no R/G/M ?MSK: no peripheral edema, right arm with splint in place, left arm without obvious abnormality with intact active/passive range of motion without significant pain, left knee with brace in place, large effusion and TTP, neurovascularly intact  ?NEURO: CN II-XII intact, no focal deficits, sensation to light touch intact ?PSYCH: normal mood/affect ?Integumentary: dry/intact, no rashes or wounds ? ? ? ?Data Reviewed: I have personally reviewed following labs and imaging studies ? ?CBC: ?Recent Labs  ?Lab 12/07/21 ?1844  ?WBC 10.7*  ?NEUTROABS 7.8*  ?HGB 12.2  ?HCT 36.4  ?MCV 94.8  ?PLT 174  ? ?Basic Metabolic Panel: ?Recent Labs  ?Lab 12/07/21 ?1844  ?NA 137  ?K 4.3  ?CL 103  ?CO2 28  ?GLUCOSE 126*  ?BUN 14  ?CREATININE 0.69  ?CALCIUM 10.0  ?MG 1.8  ? ?GFR: ?Estimated Creatinine Clearance: 79.3 mL/min (by C-G formula based on SCr of 0.69 mg/dL). ?Liver Function Tests: ?No results for input(s): AST, ALT, ALKPHOS, BILITOT, PROT, ALBUMIN in the last 168 hours. ?No results for input(s): LIPASE, AMYLASE in the last 168 hours. ?No results for input(s): AMMONIA in the last  168 hours. ?Coagulation Profile: ?No results for input(s): INR, PROTIME in the last 168 hours. ?Cardiac Enzymes: ?No results for input(s): CKTOTAL, CKMB, CKMBINDEX, TROPONINI in the last 168 hours. ?BNP (last 3 results) ?No results for input(s): PROBNP in the last 8760 hours. ?HbA1C: ?No results for input(s): HGBA1C in the last 72 hours. ?CBG: ?Recent Labs  ?Lab 12/08/21 ?1741 12/08/21 ?2037 12/09/21 ?02/08/22  ?GLUCAP 185* 144* 125*  ? ?Lipid Profile: ?No results for input(s): CHOL, HDL, LDLCALC, TRIG, CHOLHDL, LDLDIRECT in the last 72 hours. ?Thyroid Function Tests: ?No results for input(s): TSH, T4TOTAL, FREET4, T3FREE, THYROIDAB in the last 72 hours. ?Anemia Panel: ?No results for input(s): VITAMINB12, FOLATE, FERRITIN, TIBC, IRON, RETICCTPCT in the last 72 hours. ?Sepsis Labs: ?No results for input(s): PROCALCITON, LATICACIDVEN in the last 168 hours. ? ?Recent Results (from the past 240 hour(s))  ?Surgical pcr screen     Status: None  ? Collection Time: 12/09/21  2:09 AM  ? Specimen: Nasal Mucosa; Nasal Swab  ?Result Value Ref Range Status  ? MRSA, PCR NEGATIVE NEGATIVE Final  ? Staphylococcus aureus NEGATIVE NEGATIVE Final  ?  Comment: (NOTE) ?The Xpert SA Assay (FDA approved for NASAL specimens in patients 81 ?years of age and older), is one component of a comprehensive ?surveillance program. It is not intended to diagnose infection nor to ?  guide or monitor treatment. ?Performed at [facility], [address] [place], [place] ?[id number] ?  ?  ? ? ? ? ? ?Radiology Studies: ?DG Elbow Complete Left ? ?Result Date: 12/07/2021 ?CLINICAL DATA:  Elbow pain post fall EXAM: LEFT ELBOW - COMPLETE 3+ VIEW COMPARISON:  None FINDINGS: Osseous demineralization. Moderate-sized joint effusion. Nondisplaced intra-articular radial head fracture. No additional fracture, dislocation, or bone destruction. IMPRESSION: Nondisplaced intra-articular fracture of the LEFT radial head with associated moderate elbow joint effusion.  Electronically Signed   By: Ulyses SouthwardMark  Boles M.D.   On: 12/07/2021 18:25  ? ?DG Elbow Complete Right ? ?Result Date: 12/07/2021 ?CLINICAL DATA:  Trauma, fall EXAM: RIGHT ELBOW - COMPLETE 3+ VIEW COMPARISON:  None. FINDINGS:

## 2021-12-10 ENCOUNTER — Inpatient Hospital Stay (HOSPITAL_COMMUNITY): Payer: BC Managed Care – PPO

## 2021-12-10 DIAGNOSIS — S52201A Unspecified fracture of shaft of right ulna, initial encounter for closed fracture: Secondary | ICD-10-CM

## 2021-12-10 DIAGNOSIS — W19XXXA Unspecified fall, initial encounter: Secondary | ICD-10-CM | POA: Diagnosis not present

## 2021-12-10 DIAGNOSIS — E119 Type 2 diabetes mellitus without complications: Secondary | ICD-10-CM

## 2021-12-10 DIAGNOSIS — R296 Repeated falls: Secondary | ICD-10-CM

## 2021-12-10 DIAGNOSIS — S52122A Displaced fracture of head of left radius, initial encounter for closed fracture: Secondary | ICD-10-CM

## 2021-12-10 DIAGNOSIS — M2352 Chronic instability of knee, left knee: Secondary | ICD-10-CM

## 2021-12-10 DIAGNOSIS — M25462 Effusion, left knee: Secondary | ICD-10-CM

## 2021-12-10 DIAGNOSIS — F32A Depression, unspecified: Secondary | ICD-10-CM

## 2021-12-10 DIAGNOSIS — Z720 Tobacco use: Secondary | ICD-10-CM

## 2021-12-10 DIAGNOSIS — E7800 Pure hypercholesterolemia, unspecified: Secondary | ICD-10-CM

## 2021-12-10 DIAGNOSIS — I10 Essential (primary) hypertension: Secondary | ICD-10-CM

## 2021-12-10 DIAGNOSIS — G894 Chronic pain syndrome: Secondary | ICD-10-CM

## 2021-12-10 DIAGNOSIS — F419 Anxiety disorder, unspecified: Secondary | ICD-10-CM | POA: Diagnosis not present

## 2021-12-10 LAB — BASIC METABOLIC PANEL
Anion gap: 10 (ref 5–15)
BUN: 15 mg/dL (ref 8–23)
CO2: 24 mmol/L (ref 22–32)
Calcium: 9 mg/dL (ref 8.9–10.3)
Chloride: 104 mmol/L (ref 98–111)
Creatinine, Ser: 0.75 mg/dL (ref 0.44–1.00)
GFR, Estimated: >60 mL/min (ref >60)
Glucose, Bld: 153 mg/dL — ABNORMAL HIGH (ref 70–99)
Potassium: 3.9 mmol/L (ref 3.5–5.1)
Sodium: 138 mmol/L (ref 135–145)

## 2021-12-10 LAB — CBC
HCT: 30.9 % — ABNORMAL LOW (ref 36.0–46.0)
Hemoglobin: 10.7 g/dL — ABNORMAL LOW (ref 12.0–15.0)
MCH: 32.5 pg (ref 26.0–34.0)
MCHC: 34.6 g/dL (ref 30.0–36.0)
MCV: 93.9 fL (ref 80.0–100.0)
Platelets: 159 10*3/uL (ref 150–400)
RBC: 3.29 MIL/uL — ABNORMAL LOW (ref 3.87–5.11)
RDW: 13.5 % (ref 11.5–15.5)
WBC: 22.1 10*3/uL — ABNORMAL HIGH (ref 4.0–10.5)
nRBC: 0 % (ref 0.0–0.2)

## 2021-12-10 LAB — GLUCOSE, CAPILLARY
Glucose-Capillary: 424 mg/dL — ABNORMAL HIGH (ref 70–99)
Glucose-Capillary: 219 mg/dL — ABNORMAL HIGH (ref 70–99)
Glucose-Capillary: 148 mg/dL — ABNORMAL HIGH (ref 70–99)
Glucose-Capillary: 190 mg/dL — ABNORMAL HIGH (ref 70–99)
Glucose-Capillary: 176 mg/dL — ABNORMAL HIGH (ref 70–99)
Glucose-Capillary: 192 mg/dL — ABNORMAL HIGH (ref 70–99)

## 2021-12-10 MED ORDER — INSULIN ASPART 100 UNIT/ML IJ SOLN
10.0000 [IU] | Freq: Once | INTRAMUSCULAR | Status: AC
  Administered 2021-12-10: 10 [IU] via SUBCUTANEOUS

## 2021-12-10 MED ORDER — OXYCODONE-ACETAMINOPHEN 5-325 MG PO TABS
1.0000 | ORAL_TABLET | ORAL | Status: DC | PRN
  Administered 2021-12-10 – 2021-12-12 (×11): 2 via ORAL
  Filled 2021-12-10 (×11): qty 2

## 2021-12-10 NOTE — Progress Notes (Addendum)
Worker's Compensation case. ?NCM spoke with Amada Jupiter (claim's adjuster), [id number]. Mary provided NCM with claim's #, [id number].  Billing: Zada Girt, [place], [place]. Pt works for [employer]. Vallarie Mare RN,CM,  [phone], fax [phone], [email or web address]. ?Whitman Hero  RN,BSN,CM ?[phone] ?

## 2021-12-10 NOTE — Progress Notes (Signed)
?PROGRESS NOTE ? ? ? ?Alexandra Parsons  PJA:[id number] DOB: [birth year] DOA: 12/07/2021 ?PCP: Wanda Plump, MD  ? ?Brief Narrative:  ?The patient Alexandra Parsons is a 61 year old female with past medical history significant for type 2 diabetes mellitus, HTN, HLD, chronic pain, anxiety/depression, who presented to [facility] ED on 4/30 with complaint of bilateral arm pain and left knee pain.  She reports she was walking across a flat concrete surface when her shoe caught on the ground causing her to fall forward.  She was carrying objects in her hands at that time and when she fell she strike to both knees and elbows.  Denies loss of consciousness and did not feel lightheaded or dizzy at time of event.  She reports a previous fall roughly 3 weeks ago in which she did not get injured and attributes this to her vision and likely needs to see an eye doctor.  Patient received Dilaudid in the ED with a transient episode of hypoxia afterwards. ?  ?In the ED, temperature 98.5 ?F, HR 72, RR 18, BP 160/77, SPO2 100% on room air.  WBC 10.7, hemoglobin 12.2, platelets 174.  Sodium 137, potassium 4.3, chloride 103, CO2 28, glucose 126, BUN 14, creatinine 0.69.  Magnesium 1.8.  Left elbow x-ray with nondisplaced intra-articular fracture left radial head with associated moderate elbow joint effusion.  Right elbow with severely comminuted, slightly displaced fracture proximal right ulna.  Left knee x-ray with no fracture/dislocation with moderate to large effusion possible hemarthrosis in the suprapatellar bursa.  Orthopedics was consulted.  Patient was transferred to [facility] for surgical intervention.  Hospitalist service consulted for admission ? ?**Interim History ?Patient is status post open reduction internal fixation of the right olecranon also with a nondisplaced left tibial plateau fracture.  PT OT to optimize mobilization safety and they are recommending touchdown weightbearing on the left lower extremity  as she has a brace at home that they will plan the patient on her left leg locked in extension.  Per Ortho she is nonweightbearing the right upper extremity.  She had an MRI done which showed a nondisplaced tibial plateau fracture.  Pain is controlled on current regimen.  ? ? ?Assessment and Plan: ? ?Right comminuted olecranon fracture ?Left nondisplaced radial head fracture ?Left knee hemarthrosis with acute nondisplaced tibial plateau fracture ?-Patient presenting to ED following mechanical fall with pain to bilateral upper extremities and left knee. Left elbow x-ray with nondisplaced intra-articular fracture left radial head with associated moderate elbow joint effusion.   ?-Right elbow with severely comminuted, slightly displaced fracture proximal right ulna.   ?-Left knee x-ray with no fracture/dislocation with moderate to large effusion possible hemarthrosis in the suprapatellar bursa.  ?-Orthopedics following, appreciate assistance ?-C/w Oxycodone-Acetaminophen 5-325 mg 1-2 po q4hprn Moderate Pain ?-C/w Morphine 2 mg IV q2h PRN severe pain ?-C/w Methocarbamol 500 mg po/IV q6hprn as needed muscle spasms ?-She was N.p.o. for planned ORIF right elbow olecranon fracture and left knee aspiration yesterday ?-MRI Left knee done after aspiration showed "Acute, nondisplaced, fracture extending from the medial tibial spine inferiorly towards the  anterior lateral and the anterior mid-transverse portions of the  proximal  tibial metaphysis. Moderate lipohemarthrosis.  Intrasubstance degeneration within the medial and lateral menisci without definite tear extending through an articular surface. Mucoid  degeneration of the ACL.  No definite tear.  Moderate patellofemoral osteoarthritis." ?-Weightbearing status per orthopedic surgery and they are recommending touchdown weightbearing on the left lower extremity and nonweightbearing right upper  extremity and she may use her left arm for activity and ADLs ?-VTE prophylaxis  per orthopedic surgery and they have initiated the patient on aspirin 325 mg p.o. daily as well as SCDs and ambulation ?-She is given postoperative antibiotics with Ancef x2 additional doses ?-Continue to monitor for signs and symptoms of bleeding ?-Bowel Reigmen with Miralax 17 grams po Daily and Docusate 100 mg po BID along with Bisacodyl 5 mg po dailyPRN Moderate Constipation ? ?Leukocytosis ?-Patient's WBC went from 10.7 -> 22.1 ?-Likely reactive in the setting of Surgery and Pain ?-Continue to Monitor for S/Sx of Bleeding; No overt bleeding noted ?-Repeat CBC in the AM  ? ?ABLA as a Post-Operative Drop ?-Patient's Hgb/Hct went from 12.2/36.4 -> 10.7/30.9 ?-Check Anemia Panel in the AM  ?-Continue to Monitor for S/Sx of Bleeding; No overt Bleeding noted ?-Repeat CBC in the AM ?  ?Type 2 Diabetes Mellitus ?-Recent hemoglobin A1c, 6.2, well controlled.   ?-Home regimen includes metformin 1000 mg p.o. twice daily, pioglitazone 30 mg p.o. daily, sitagliptin 100 mg p.o. daily. ?-Continue Holding oral hypoglycemics while inpatient ?-Added Moderate Novolog SSI AC/HS for coverage ?-CBGs qAC/HS ?-CBGs ranging from  ?  ?Essential Hypertension ?-Continue bisoprolol-HCTZ 2.5-6.25 mg p.o. daily ?-Continue to Monitor BP per Protocol ?-Last BP reading was  ?  ?Hyperlipidemia ?-C/w Ezetimibe 10mg  PO daily ?-C/w Crestor 10mg  PO daily ?  ?Anxiety/Depression: ?-C/w Escitalopram 20 mg p.o. daily ?  ?Nicotine use disorder ?-Counseled on need for cessation. ?-C/w Nicotine Patch 21 mg TD ?  ? DVT prophylaxis: SCDs Start: 12/08/21 1350 ? ?  Code Status: DNR ?Family Communication: Discussed with Daughter at bedside  ? ?Disposition Plan:  ?Level of care: Med-Surg ?Status is: Inpatient ?Remains inpatient appropriate because: Pending PT/OT evaluation and Orthopedic Clearance  ?  ?Consultants:  ?Orthopedic Surgery  ? ?Procedures:  ?PROCEDURE: 1.  Open reduction internal fixation right olecranon ?2.  Closed management of left radial head  fracture ?3.  Exam under anesthesia with aspiration of left knee ? ?Antimicrobials:  ?Anti-infectives (From admission, onward)  ? ? Start     Dose/Rate Route Frequency Ordered Stop  ? 12/10/21 0600  ceFAZolin (ANCEF) IVPB 2g/100 mL premix       ? 2 g ?200 mL/hr over 30 Minutes Intravenous On call to O.R. 12/09/21 1252 12/09/21 1518  ? 12/09/21 2300  ceFAZolin (ANCEF) IVPB 2g/100 mL premix       ? 2 g ?200 mL/hr over 30 Minutes Intravenous Every 8 hours 12/09/21 1828 12/10/21 0918  ? ?  ?  ?Subjective: ?And examined at bedside and states that she had not taken her pain medication and had forgotten some things.  No nausea or vomiting.  States her pain is relatively well controlled.  No nausea or vomiting.  States that she injured herself at work.  No other concerns or complaints at this time. ? ?Objective: ?Vitals:  ? 12/09/21 1810 12/09/21 1817 12/09/21 1917 12/10/21 0534  ?BP: 123/61 139/64 (!) 128/56 (!) 142/61  ?Pulse: 62 69 66 (!) 57  ?Resp: 16 18 16 16   ?Temp: 98 ?F (36.7 ?C) 98.2 ?F (36.8 ?C) 98.1 ?F (36.7 ?C) 98.3 ?F (36.8 ?C)  ?TempSrc:  Oral Oral   ?SpO2: 96% 94% 97% 96%  ?Weight:      ? ? ?Intake/Output Summary (Last 24 hours) at 12/10/2021 1610 ?Last data filed at 12/10/2021 1608 ?Gross per 24 hour  ?Intake 2375.4 ml  ?Output 2575 ml  ?Net -199.6 ml  ? ?Filed Weights  ?  12/07/21 1621  ?Weight: 77.6 kg  ? ?Examination: ?Physical Exam: ? ?Constitutional: WN/WD overweight Caucasian female currently in no acute distress appears calm.  ?Respiratory: Diminished to auscultation bilaterally with coarse breath sounds, no wheezing, rales, rhonchi or crackles. Normal respiratory effort and patient is not tachypenic. No accessory muscle use.  Unlabored breathing ?Cardiovascular: RRR, no murmurs / rubs / gallops. S1 and S2 auscultated. No extremity edema.  ?Abdomen: Soft, non-tender, Distended secondary to body habitus.  Bowel sounds positive.  ?GU: Deferred. ?Musculoskeletal: No clubbing / cyanosis of digits/nails.  Right  arm is wrapped in Ace bandage ?Skin: No rashes, lesions, ulcers on limited skin evaluation. No induration; Warm and dry.  ?Neurologic: CN 2-12 grossly intact with no focal deficits.  Romberg sign and cereb

## 2021-12-10 NOTE — Anesthesia Postprocedure Evaluation (Signed)
Anesthesia Post Note ? ?Patient: Alexandra Parsons ? ?Procedure(s) Performed: OPEN REDUCTION INTERNAL FIXATION (ORIF) ELBOW/OLECRANON FRACTURE (Right: Elbow) ?LEFT KNEE  NEEDLE ASPIRATION (Left: Knee) ? ?  ? ?Patient location during evaluation: PACU ?Anesthesia Type: Regional and General ?Level of consciousness: awake and alert ?Pain management: pain level controlled ?Vital Signs Assessment: post-procedure vital signs reviewed and stable ?Respiratory status: spontaneous breathing, nonlabored ventilation, respiratory function stable and patient connected to nasal cannula oxygen ?Cardiovascular status: blood pressure returned to baseline and stable ?Postop Assessment: no apparent nausea or vomiting ?Anesthetic complications: no ? ? ?No notable events documented. ? ?Last Vitals:  ?Vitals:  ? 12/09/21 1917 12/10/21 0534  ?BP: (!) 128/56 (!) 142/61  ?Pulse: 66 (!) 57  ?Resp: 16 16  ?Temp: 36.7 ?C 36.8 ?C  ?SpO2: 97% 96%  ?  ?  ?  ?  ?  ? ?Shelton Silvas ? ? ? ? ?

## 2021-12-10 NOTE — Evaluation (Signed)
Physical Therapy Evaluation ?Patient Details ?Name: Alexandra Parsons ?MRN: [id number] ?DOB: [birth year] ?Today's Date: 12/10/2021 ? ?History of Present Illness ? Pt is a 61 y.o. female admitted 12/07/21 after fall sustaining R olecranono fx, L radial head fx, L knee effusion without fx. S/p R olecranon ORIF, closed management L radial fx, L knee aspiration on 5/2. MRI L knee 5/3 showed acute, nondisplaced tibial spine fx, no definite ligament tear; plan for non-operative management. PMH includes DM2, HTN, chronic pain, anxiety/depression. ?  ?Clinical Impression ? Pt presents with an overall decrease in functional mobility secondary to above. PTA, pt independent, working, lives alone with supportive daughter nearby. Initiated educ re: RUE/LLE precautions, positioning, activity recommendations and importance of mobility. Today, pt able to initiate standing and transfer training, requiring modA for mobility with difficulty maintaining LLE TDWB precautions. Pt motivated to participate and regain independent PLOF; daughter present and supportive. Pt would benefit from intensive CIR-level therapies to maximize functional mobility and independence prior to return home.    ? ?Recommendations for follow up therapy are one component of a multi-disciplinary discharge planning process, led by the attending physician.  Recommendations may be updated based on patient status, additional functional criteria and insurance authorization. ? ?Follow Up Recommendations Acute inpatient rehab (3hours/day) ? ?  ?Assistance Recommended at Discharge Intermittent Supervision/Assistance  ?Patient can return home with the following ? A little help with walking and/or transfers;A little help with bathing/dressing/bathroom;Assistance with cooking/housework;Assist for transportation;Help with stairs or ramp for entrance ? ?  ?Equipment Recommendations Wheelchair (measurements PT);Wheelchair cushion (measurements PT);BSC/3in1  ?Recommendations for Other  Services ?  Rehab consult ?  ?Functional Status Assessment Patient has had a recent decline in their functional status and demonstrates the ability to make significant improvements in function in a reasonable and predictable amount of time.  ? ?  ?Precautions / Restrictions Precautions ?Precautions: Fall ?Required Braces or Orthoses: Other Brace ?Knee Immobilizer - Left: On at all times ?Other Brace: bledsoe brace locked in extension at all times per secure chat with ortho MD (MD to order brace 5/3) ?Restrictions ?Weight Bearing Restrictions: Yes ?RUE Weight Bearing: Non weight bearing ?LUE Weight Bearing: Touch down weight bearing ?LLE Weight Bearing: Touchdown weight bearing ?Other Position/Activity Restrictions: Patient has been cleared to use LUE in functional tasks  ? ?  ? ?Mobility ? Bed Mobility ?Overal bed mobility: Needs Assistance ?Bed Mobility: Supine to Sit ?  ?  ?Supine to sit: Min assist ?  ?  ?General bed mobility comments: repeated cues for sequencing, pt restless and anxious with mobility; cues to maintain RUE NWB precautions, minA for trunk elevation and LLE management with KI donned ?  ? ?Transfers ?Overall transfer level: Needs assistance ?Equipment used: Hemi-walker, 1 person hand held assist ?Transfers: Sit to/from Stand, Bed to chair/wheelchair/BSC ?Sit to Stand: Min assist ?  ?Step pivot transfers: Mod assist ?  ?  ?  ?General transfer comment: initial minA for trunk elevation and stability, mod cues for sequencing, pt reliant on LUE support of hemiwalker to maintain static standing with LLE NWB; return to sit then additional stand with step/hop pivot transfer from bed to recliner, cues for sequencing and modA for stability, pt with difficulty maintaining LLE NWB precautions ?  ? ?Ambulation/Gait ?  ?  ?  ?  ?  ?  ?  ?  ? ?Stairs ?  ?  ?  ?  ?  ? ?Wheelchair Mobility ?  ? ?Modified Rankin (Stroke Patients Only) ?  ? ?  ? ?  Balance Overall balance assessment: Needs assistance ?Sitting-balance  support: Feet supported ?Sitting balance-Leahy Scale: Good ?  ?  ?Standing balance support: Single extremity supported, Reliant on assistive device for balance ?Standing balance-Leahy Scale: Poor ?  ?  ?  ?  ?  ?  ?  ?  ?  ?  ?  ?  ?   ? ? ? ?Pertinent Vitals/Pain Pain Assessment ?Pain Assessment: Faces ?Faces Pain Scale: Hurts even more ?Pain Location: LLE > RUE ?Pain Descriptors / Indicators: Pressure, Spasm ?Pain Intervention(s): Monitored during session, Limited activity within patient's tolerance  ? ? ?Home Living Family/patient expects to be discharged to:: Private residence ?Living Arrangements: Alone ?Available Help at Discharge: Family ?Type of Home: House ?Home Access: Stairs to enter ?Entrance Stairs-Rails: None ?Entrance Stairs-Number of Steps: 3 ?  ?Home Layout: One level ?Home Equipment: None ?Additional Comments: may have some DME to borrow, pt unsure. daughter lives nearby  ?  ?Prior Function Prior Level of Function : Independent/Modified Independent;Working/employed;Driving ?  ?  ?  ?  ?  ?  ?  ?  ?  ? ? ?Hand Dominance  ? Dominant Hand: Right ? ?  ?Extremity/Trunk Assessment  ? Upper Extremity Assessment ?Upper Extremity Assessment: RUE deficits/detail;LUE deficits/detail ?RUE Deficits / Details: RUE in cast from upper arm to MCPs, able to wiggle shoulder; good shoulder AROM ?RUE Sensation: decreased light touch ?LUE Deficits / Details: near full shoulder/elbow/wrist ROM, pt notes some soreness with AROM (especially elbow) ?  ? ?Lower Extremity Assessment ?Lower Extremity Assessment: Generalized weakness;LLE deficits/detail ?LLE Deficits / Details: L tibial fx; pt able to perform near full SLR, ankle pumps; KI donned to maintain strict L knee extension ?LLE: Unable to fully assess due to immobilization ?  ? ?Cervical / Trunk Assessment ?Cervical / Trunk Assessment: Normal  ?Communication  ? Communication: No difficulties  ?Cognition Arousal/Alertness: Awake/alert ?Behavior During Therapy: Restless,  Anxious ?Overall Cognitive Status: Impaired/Different from baseline ?Area of Impairment: Safety/judgement, Following commands, Problem solving, Attention, Awareness ?  ?  ?  ?  ?  ?  ?  ?  ?  ?Current Attention Level: Sustained, Selective ?  ?Following Commands: Follows one step commands with increased time ?Safety/Judgement: Decreased awareness of safety, Decreased awareness of deficits ?Awareness: Emergent ?Problem Solving: Difficulty sequencing, Requires verbal cues, Requires tactile cues ?General Comments: Impulsive, decreased safety and decreased insight requiring frequent cues and redirection ?  ?  ? ?  ?General Comments General comments (skin integrity, edema, etc.): pt's daughter present and supportive. Secure chat sent to MD that pt does not have hinged brace, only KI; ortho MD to order bledsoe brace to be locked in extension ? ?  ?Exercises    ? ?Assessment/Plan  ?  ?PT Assessment Patient needs continued PT services  ?PT Problem List Decreased strength;Decreased range of motion;Decreased activity tolerance;Decreased balance;Decreased mobility;Decreased cognition;Decreased knowledge of use of DME;Decreased safety awareness;Decreased knowledge of precautions;Pain ? ?   ?  ?PT Treatment Interventions DME instruction;Gait training;Functional mobility training;Therapeutic activities;Therapeutic exercise;Balance training;Stair training;Patient/family education;Wheelchair mobility training   ? ?PT Goals (Current goals can be found in the Care Plan section)  ?Acute Rehab PT Goals ?Patient Stated Goal: return home ?PT Goal Formulation: With patient ?Time For Goal Achievement: 12/24/21 ?Potential to Achieve Goals: Good ? ?  ?Frequency Min 5X/week ?  ? ? ?Co-evaluation   ?  ?  ?  ?  ? ? ?  ?AM-PAC PT "6 Clicks" Mobility  ?Outcome Measure Help needed turning from your back to  your side while in a flat bed without using bedrails?: A Little ?Help needed moving from lying on your back to sitting on the side of a flat bed  without using bedrails?: A Little ?Help needed moving to and from a bed to a chair (including a wheelchair)?: A Lot ?Help needed standing up from a chair using your arms (e.g., wheelchair or bedside c

## 2021-12-10 NOTE — Plan of Care (Signed)

## 2021-12-10 NOTE — Progress Notes (Addendum)
? ?  Subjective: ? ?Patient reports pain as moderate. Nerve block is intact on right arm. Left knee feeling improved today. MRI obtained overnight with evidence of nondisplaced tibial plateau fracture. ? ? ?Objective:  ? ?VITALS:   ?Vitals:  ? 12/09/21 1810 12/09/21 1817 12/09/21 1917 12/10/21 0534  ?BP: 123/61 139/64 (!) 128/56 (!) 142/61  ?Pulse: 62 69 66 (!) 57  ?Resp: 16 18 16 16   ?Temp: 98 ?F (36.7 ?C) 98.2 ?F (36.8 ?C) 98.1 ?F (36.7 ?C) 98.3 ?F (36.8 ?C)  ?TempSrc:  Oral Oral   ?SpO2: 96% 94% 97% 96%  ?Weight:      ? ? ? ? ?Lab Results  ?Component Value Date  ? WBC 10.7 (H) 12/07/2021  ? HGB 12.2 12/07/2021  ? HCT 36.4 12/07/2021  ? MCV 94.8 12/07/2021  ? PLT 174 12/07/2021  ? ? ?Right elbow is in a splint.  Nerve block is currently in effect.  2+ radial pulse. ?  ?Left elbow she has range of motion from 0-90.  This is otherwise painful past 90.  She has full pro supination.  2+ radial pulse.  Sensation is intact distally.  Tender about the radial head. ?  ?Left knee with improved effusion.  She has pain with any range of motion. Able to fire EHL as well as tibialis anterior and gastrocsoleus.  2+ dorsalis pedis pulse.  She is not able to tolerate any ligamentous testing of the knee. ?  ? ? ?Assessment/Plan: ? ?1 Day Post-Op status post open reduction internal fixation of right olecranon also with a left nondisplaced tibial plateau fracture ? ?- Expected postop acute blood loss anemia - will monitor for symptoms ?- Patient to work with PT/OT to optimize mobilization safely -she will be TDWB on the left lower extremity.  She has a brace at home that we will plan to place on her left leg locked in extension ?- DVT ppx - SCDs, ambulation, aspirin 325 daily ?- Postoperative Abx: Ancef x 2 additional doses given ?-Nonweightbearing right upper extremity.  She may use her left arm for activity and ADLs.  TDWB left lower extremity ?- Discharge planning pending CM, appreciate coordination  ? ?Alexandra Parsons ?12/10/2021,  7:44 AM ? ?

## 2021-12-10 NOTE — Progress Notes (Signed)
Orthopedic Tech Progress Note ?Patient Details:  ?Alexandra Parsons ?1961-02-16 ?[id number] ? ?Called in order to [facility] for a LOCKED ROM KNEE BRACE ? ?Patient ID: [name], female   DOB: 25-Mar-1961, 61 y.o.   MRN: [id number] ? ?[name] ?12/10/2021, 2:34 PM ? ?

## 2021-12-10 NOTE — Evaluation (Signed)
Occupational Therapy Evaluation ?Patient Details ?Name: Alexandra KinsmanJulie W Streiff ?MRN: [id number] ?DOB: 08/23/1960 ?Today's Date: 12/10/2021 ? ? ?History of Present Illness Pt is a 61 y.o. female admitted 12/07/21 after fall sustaining R olecranono fx, L radial head fx, L knee effusion without fx. S/p R olecranon ORIF, closed management L radial fx, L knee aspiration on 5/2.  PMH includes DM2, HTN, chronic pain, anxiety/depression.  ? ?Clinical Impression ?  ?Patient admitted for the injuries and procedure above.  PTA she lives alone, works full time as an Charity fundraiserN at a [employer].  Deficits impacting independence are listed below.  Currently she is needing up to Mod A for ADL completion from a seated level, and up to Min A for basic squat pivot transfers.  OT will continue efforts in the acute setting, and AIR is recommended for post acute rehab prior to returning home. Daughter is working on the ability to cover hours at home once the patient discharges.     ?   ? ?Recommendations for follow up therapy are one component of a multi-disciplinary discharge planning process, led by the attending physician.  Recommendations may be updated based on patient status, additional functional criteria and insurance authorization.  ? ?Follow Up Recommendations ? Acute inpatient rehab (3hours/day)  ?  ?Assistance Recommended at Discharge Frequent or constant Supervision/Assistance  ?Patient can return home with the following A lot of help with walking and/or transfers;A lot of help with bathing/dressing/bathroom;Assist for transportation;Help with stairs or ramp for entrance;Assistance with cooking/housework ? ?  ?Functional Status Assessment ? Patient has had a recent decline in their functional status and demonstrates the ability to make significant improvements in function in a reasonable and predictable amount of time.  ?Equipment Recommendations ? BSC/3in1;Tub/shower bench  ?  ?Recommendations for Other Services Rehab consult ? ? ?   ?Precautions / Restrictions Precautions ?Precautions: Fall ?Required Braces or Orthoses: Knee Immobilizer - Left ?Knee Immobilizer - Left: On when out of bed or walking ?Restrictions ?Weight Bearing Restrictions: Yes ?RUE Weight Bearing: Non weight bearing ?LUE Weight Bearing: Touch down weight bearing ?LLE Weight Bearing: Touchdown weight bearing ?Other Position/Activity Restrictions: Patient has been cleared to use LUE in functional tasks.  ? ?  ? ?Mobility Bed Mobility ?Overal bed mobility: Needs Assistance ?Bed Mobility: Supine to Sit ?  ?  ?Supine to sit: Min assist ?  ?  ?  ?  ? ?Transfers ?Overall transfer level: Needs assistance ?Equipment used: Hemi-walker ?Transfers: Sit to/from Stand, Bed to chair/wheelchair/BSC ?Sit to Stand: Min assist ?  ?  ?Step pivot transfers: Mod assist ?  ?  ?  ?  ? ?  ?Balance Overall balance assessment: Needs assistance ?Sitting-balance support: Feet supported ?Sitting balance-Leahy Scale: Good ?  ?  ?Standing balance support: Single extremity supported, Reliant on assistive device for balance ?Standing balance-Leahy Scale: Poor ?  ?  ?  ?  ?  ?  ?  ?  ?  ?  ?  ?  ?   ? ?ADL either performed or assessed with clinical judgement  ? ?ADL Overall ADL's : Needs assistance/impaired ?Eating/Feeding: Set up;Sitting ?  ?Grooming: Wash/dry hands;Wash/dry face;Minimal assistance ?  ?Upper Body Bathing: Moderate assistance;Sitting ?  ?Lower Body Bathing: Maximal assistance;Bed level ?  ?Upper Body Dressing : Moderate assistance;Sitting ?  ?Lower Body Dressing: Maximal assistance;Sitting/lateral leans ?  ?  ?  ?  ?  ?  ?  ?  ?   ? ? ? ?Vision Patient Visual Report: No change  from baseline ?   ?   ?Perception Perception ?Perception: Within Functional Limits ?  ?Praxis Praxis ?Praxis: Intact ?  ? ?Pertinent Vitals/Pain Pain Assessment ?Pain Assessment: Faces ?Faces Pain Scale: Hurts even more ?Pain Descriptors / Indicators: Pressure, Spasm ?Pain Intervention(s): Monitored during session   ? ? ? ?Hand Dominance Right ?  ?Extremity/Trunk Assessment Upper Extremity Assessment ?Upper Extremity Assessment: RUE deficits/detail;LUE deficits/detail ?RUE Deficits / Details: Able to wiggle fingers and move shoulder. ?RUE Sensation: decreased light touch ?LUE Deficits / Details: Less discomfort with AROM noted. ?  ?Lower Extremity Assessment ?Lower Extremity Assessment: Defer to PT evaluation ?  ?Cervical / Trunk Assessment ?Cervical / Trunk Assessment: Normal ?  ?Communication Communication ?Communication: No difficulties ?  ?Cognition Arousal/Alertness: Awake/alert ?Behavior During Therapy: Restless, Anxious ?Overall Cognitive Status: Impaired/Different from baseline ?Area of Impairment: Safety/judgement, Following commands, Problem solving ?  ?  ?  ?  ?  ?  ?  ?  ?  ?  ?  ?Following Commands: Follows one step commands with increased time ?Safety/Judgement: Decreased awareness of safety, Decreased awareness of deficits ?  ?Problem Solving: Difficulty sequencing, Requires verbal cues, Requires tactile cues ?General Comments: Impulsive, decreased safety and decreased insight. ?  ?  ?General Comments   VSS on RA ? ?  ?Exercises   ?  ?Shoulder Instructions    ? ? ?Home Living Family/patient expects to be discharged to:: Private residence ?Living Arrangements: Alone ?Available Help at Discharge: Family;Available 24 hours/day ?Type of Home: House ?Home Access: Stairs to enter ?Entrance Stairs-Number of Steps: 3 ?Entrance Stairs-Rails: None ?Home Layout: One level ?  ?  ?Bathroom Shower/Tub: Tub/shower unit ?  ?Bathroom Toilet: Standard ?Bathroom Accessibility: Yes ?How Accessible: Accessible via walker ?Home Equipment: None ?  ?Additional Comments: may have some equipment, not sure. ?  ? ?  ?Prior Functioning/Environment Prior Level of Function : Independent/Modified Independent;Working/employed;Driving ?  ?  ?  ?  ?  ?  ?  ?  ?  ? ?  ?  ?OT Problem List: Decreased strength;Decreased activity tolerance;Decreased  knowledge of use of DME or AE;Decreased safety awareness;Pain;Impaired UE functional use ?  ?   ?OT Treatment/Interventions: Self-care/ADL training;Balance training;Patient/family education;Therapeutic activities;DME and/or AE instruction  ?  ?OT Goals(Current goals can be found in the care plan section) Acute Rehab OT Goals ?Patient Stated Goal: Return home safely ?OT Goal Formulation: With patient ?Time For Goal Achievement: 12/24/21 ?Potential to Achieve Goals: Good ?ADL Goals ?Pt Will Perform Grooming: with set-up;sitting ?Pt Will Perform Upper Body Dressing: with set-up;sitting ?Pt Will Perform Lower Body Dressing: with set-up;sitting/lateral leans ?Pt Will Transfer to Toilet: with supervision;stand pivot transfer;bedside commode  ?OT Frequency: Min 2X/week ?  ? ?Co-evaluation   ?  ?  ?  ?  ? ?  ?AM-PAC OT "6 Clicks" Daily Activity     ?Outcome Measure Help from another person eating meals?: A Little ?Help from another person taking care of personal grooming?: A Little ?Help from another person toileting, which includes using toliet, bedpan, or urinal?: A Lot ?Help from another person bathing (including washing, rinsing, drying)?: A Lot ?Help from another person to put on and taking off regular upper body clothing?: A Lot ?Help from another person to put on and taking off regular lower body clothing?: A Lot ?6 Click Score: 14 ?  ?End of Session Equipment Utilized During Treatment: Gait belt;Left knee immobilizer;Other (comment) (hmei walker) ?Nurse Communication: Mobility status ? ?Activity Tolerance: Patient tolerated treatment well ?Patient left: in chair;with call  bell/phone within reach;with chair alarm set;with family/visitor present ? ?OT Visit Diagnosis: Unsteadiness on feet (R26.81)  ?              ?Time: 8563-1497 ?OT Time Calculation (min): 29 min ?Charges:  OT General Charges ?$OT Visit: 1 Visit ?OT Evaluation ?$OT Eval Moderate Complexity: 1 Mod ? ?12/10/2021 ? ?RP, OTR/L ? ?Acute Rehabilitation  Services ? ?Office:  [phone] ? ? ?Kiev [place] D Daymon Hora ?12/10/2021, 3:16 PM ?

## 2021-12-10 NOTE — Hospital Course (Addendum)
The patient Alexandra Parsons is a 61 year old female with past medical history significant for type 2 diabetes mellitus, HTN, HLD, chronic pain, anxiety/depression, who presented to [facility] ED on 4/30 with complaint of bilateral arm pain and left knee pain.  She reports she was walking across a flat concrete surface when her shoe caught on the ground causing her to fall forward.  She was carrying objects in her hands at that time and when she fell she strike to both knees and elbows.  Denies loss of consciousness and did not feel lightheaded or dizzy at time of event.  She reports a previous fall roughly 3 weeks ago in which she did not get injured and attributes this to her vision and likely needs to see an eye doctor.  Patient received Dilaudid in the ED with a transient episode of hypoxia afterwards. ?  ?In the ED, temperature 98.5 ?F, HR 72, RR 18, BP 160/77, SPO2 100% on room air.  WBC 10.7, hemoglobin 12.2, platelets 174.  Sodium 137, potassium 4.3, chloride 103, CO2 28, glucose 126, BUN 14, creatinine 0.69.  Magnesium 1.8.  Left elbow x-ray with nondisplaced intra-articular fracture left radial head with associated moderate elbow joint effusion.  Right elbow with severely comminuted, slightly displaced fracture proximal right ulna.  Left knee x-ray with no fracture/dislocation with moderate to large effusion possible hemarthrosis in the suprapatellar bursa.  Orthopedics was consulted.  Patient was transferred to [facility] for surgical intervention.  Hospitalist service consulted for admission ? ?**Interim History ?Patient is status post open reduction internal fixation of the right olecranon also with a nondisplaced left tibial plateau fracture.  PT OT to optimize mobilization safety and they are recommending touchdown weightbearing on the left lower extremity as she has a brace at home that they will plan the patient on her left leg locked in extension.  Per Ortho she is  nonweightbearing the right upper extremity.  She had an MRI done which showed a nondisplaced tibial plateau fracture.  Pain was controlled on regimen but had worsened once her Nerve Block on her Right Arm wore off. PT/OT now recommending CIR and she is medically stable to D/C. ?

## 2021-12-11 ENCOUNTER — Encounter (HOSPITAL_COMMUNITY): Payer: Self-pay | Admitting: Orthopaedic Surgery

## 2021-12-11 DIAGNOSIS — G894 Chronic pain syndrome: Secondary | ICD-10-CM | POA: Diagnosis not present

## 2021-12-11 DIAGNOSIS — F419 Anxiety disorder, unspecified: Secondary | ICD-10-CM | POA: Diagnosis not present

## 2021-12-11 DIAGNOSIS — S82141A Displaced bicondylar fracture of right tibia, initial encounter for closed fracture: Secondary | ICD-10-CM

## 2021-12-11 DIAGNOSIS — W19XXXA Unspecified fall, initial encounter: Secondary | ICD-10-CM | POA: Diagnosis not present

## 2021-12-11 DIAGNOSIS — S52122A Displaced fracture of head of left radius, initial encounter for closed fracture: Secondary | ICD-10-CM | POA: Diagnosis not present

## 2021-12-11 LAB — CBC WITH DIFFERENTIAL/PLATELET
Abs Immature Granulocytes: 0.05 10*3/uL (ref 0.00–0.07)
Basophils Absolute: 0.1 10*3/uL (ref 0.0–0.1)
Basophils Relative: 0 %
Eosinophils Absolute: 0.1 10*3/uL (ref 0.0–0.5)
Eosinophils Relative: 1 %
HCT: 31.7 % — ABNORMAL LOW (ref 36.0–46.0)
Hemoglobin: 11 g/dL — ABNORMAL LOW (ref 12.0–15.0)
Immature Granulocytes: 0 %
Lymphocytes Relative: 28 %
Lymphs Abs: 4.1 10*3/uL — ABNORMAL HIGH (ref 0.7–4.0)
MCH: 33.2 pg (ref 26.0–34.0)
MCHC: 34.7 g/dL (ref 30.0–36.0)
MCV: 95.8 fL (ref 80.0–100.0)
Monocytes Absolute: 0.8 10*3/uL (ref 0.1–1.0)
Monocytes Relative: 5 %
Neutro Abs: 9.3 10*3/uL — ABNORMAL HIGH (ref 1.7–7.7)
Neutrophils Relative %: 66 %
Platelets: 199 10*3/uL (ref 150–400)
RBC: 3.31 MIL/uL — ABNORMAL LOW (ref 3.87–5.11)
RDW: 14.1 % (ref 11.5–15.5)
WBC: 14.4 10*3/uL — ABNORMAL HIGH (ref 4.0–10.5)
nRBC: 0 % (ref 0.0–0.2)

## 2021-12-11 LAB — COMPREHENSIVE METABOLIC PANEL
ALT: 12 U/L (ref 0–44)
AST: 16 U/L (ref 15–41)
Albumin: 2.9 g/dL — ABNORMAL LOW (ref 3.5–5.0)
Alkaline Phosphatase: 55 U/L (ref 38–126)
Anion gap: 4 — ABNORMAL LOW (ref 5–15)
BUN: 18 mg/dL (ref 8–23)
CO2: 31 mmol/L (ref 22–32)
Calcium: 8.5 mg/dL — ABNORMAL LOW (ref 8.9–10.3)
Chloride: 106 mmol/L (ref 98–111)
Creatinine, Ser: 0.78 mg/dL (ref 0.44–1.00)
GFR, Estimated: >60 mL/min (ref >60)
Glucose, Bld: 116 mg/dL — ABNORMAL HIGH (ref 70–99)
Potassium: 4 mmol/L (ref 3.5–5.1)
Sodium: 141 mmol/L (ref 135–145)
Total Bilirubin: 0.6 mg/dL (ref 0.3–1.2)
Total Protein: 6.3 g/dL — ABNORMAL LOW (ref 6.5–8.1)

## 2021-12-11 LAB — PHOSPHORUS: Phosphorus: 3.4 mg/dL (ref 2.5–4.6)

## 2021-12-11 LAB — GLUCOSE, CAPILLARY
Glucose-Capillary: 118 mg/dL — ABNORMAL HIGH (ref 70–99)
Glucose-Capillary: 232 mg/dL — ABNORMAL HIGH (ref 70–99)
Glucose-Capillary: 164 mg/dL — ABNORMAL HIGH (ref 70–99)

## 2021-12-11 LAB — MAGNESIUM: Magnesium: 1.6 mg/dL — ABNORMAL LOW (ref 1.7–2.4)

## 2021-12-11 MED ORDER — MAGNESIUM SULFATE 2 GM/50ML IV SOLN
2.0000 g | Freq: Once | INTRAVENOUS | Status: AC
  Administered 2021-12-11: 2 g via INTRAVENOUS
  Filled 2021-12-11: qty 50

## 2021-12-11 NOTE — PMR Pre-admission (Signed)
PMR Admission Coordinator Pre-Admission Assessment ? ?Patient: Alexandra Parsons is an 61 y.o., female ?MRN: [id number] ?DOB: 06-26-1961 ?Height: 5'7" ?Weight: 77.6 kg ? 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 Date:      Deduct:       Out of Pocket Max:       Life Max:  ?CIR:       SNF:  ?Outpatient:      Co-Pay:  ?Home Health:       Co-Pay:  ?DME:      Co-Pay:  ?Providers: per Workers comp ?Claims Adjuster is Maryclare Bean at [phone] ? ?SECONDARY:       Policy#:      Phone#:  ? ?Financial Counselor:       Phone#:  ? ?The ?Data Collection Information Summary? for patients in Inpatient Rehabilitation Facilities with attached ?Privacy Act Statement-Health Care Records? was provided and verbally reviewed with: N/A ? ?Emergency Contact Information ?Contact Information   ? ? Name Relation Home Work Mobile  ? Nicolette Bang Daughter [phone]    ? ?  ? ? ?Current Medical History  ?Patient Admitting Diagnosis: Fall with R olecranon fracture, L radial head fracture and L knee effusion ? ?History of Present Illness:  A 61 y.o. female who presents with bilateral elbow pain as well as left knee pain.  She had a fall on the day prior at which point she states that the left foot got caught underneath her and gave out.  She fell forward landing on both arms and left knee.  She subsequently has had difficulty moving bilateral arms and left knee.  She was initially able to place weight on the left knee although there is subsequently been significant swelling at which point she was no longer able to move or place weight on this.  She was placed in a knee  immobilizer on the left knee in the emergency room.  She is placed in a splint for her right elbow.  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 She does have a history of chronic pain for which she is on long-term narcotics for what she states is attributable to arthritis.  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  She is here with her daughter. Admitted to [facility] on 12/07/21.  Underwent ORIF R olecranon fracture on 12/09/21 by Dr. Steward Drone along with closed management of left radial head fracture and aspiration of left knee.  PT/OT evaluations completed with recommendations for inpatient rehab admission. ? ? ?Patient's medical record from [facility] has been reviewed by the rehabilitation admission coordinator and physician. ? ?Past Medical History  ?Past Medical History:  ?Diagnosis Date  ? Abnormal Pap smear of cervix   ? h/o HPV  ? Chronic pain disorder   ? arthitis and fibromyalgia  ? Chronic sinusitis, chronic cough (?COPD) 07/16/2009  ? Depression   ? Diabetes mellitus   ? type  2  ? Dyslipidemia   ? Elevated LFTs 11/19/2011  ? Hypertension   ? Leukocytosis 11/19/2011  ? Menopausal syndrome   ? Neck pain 05/20/2011  ? 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 Submandibular gland swelling   ? Right  ? ? ?Has the patient had major surgery during 100 days prior to admission? Yes ? ?Family History   ?family history includes CAD (age of onset: 54) in her maternal grandfather; Colon cancer in an other family member; Diabetes in an other family member; Hypertension in an other family member. ? ?Current Medications ? ?Current Facility-Administered Medications:  ?  aspirin tablet 325 mg, 325 mg, Oral, Daily, Huel Cote, MD, 325 mg at 12/12/21 0910 ?  bisacodyl (DULCOLAX) EC tablet 5 mg, 5 mg, Oral, Daily PRN, Huel Cote, MD ?  bisoprolol-hydrochlorothiazide [facility]) 2.5-6.25 MG per tablet 1 tablet, 1 tablet, Oral, Daily, Huel Cote, MD, 1 tablet at 12/12/21 0258 ?  docusate sodium  (COLACE) capsule 100 mg, 100 mg, Oral, BID, Huel Cote, MD, 100 mg at 12/12/21 5277 ?  escitalopram (LEXAPRO) tablet 20 mg, 20 mg, Oral, Daily, Huel Cote, MD, 20 mg at 12/12/21 8242 ?  ezetimibe (ZETIA) tablet 10 mg, 10 mg, Oral, Daily, Huel Cote, MD, 10 mg at 12/12/21 0910 ?  feeding supplement (ENSURE ENLIVE / ENSURE PLUS) liquid 237 mL, 237 mL, Oral, BID BM, Huel Cote, MD, 237 mL at 12/12/21 0910 ?  insulin aspart (novoLOG) injection 0-15 Units, 0-15 Units, Subcutaneous, TID WC, Huel Cote, MD, 2 Units at 12/12/21 0815 ?  insulin aspart (novoLOG) injection 0-5 Units, 0-5 Units, Subcutaneous, QHS, Huel Cote, MD ?  methocarbamol (ROBAXIN) tablet 500 mg, 500 mg, Oral, Q6H PRN, 500 mg at 12/12/21 0648 **OR** methocarbamol (ROBAXIN) 500 mg in dextrose 5 % 50 mL IVPB, 500 mg, Intravenous, Q6H PRN, Huel Cote, MD ?  morphine (PF) 2 MG/ML injection 2 mg, 2 mg, Intravenous, Q2H PRN, Huel Cote, MD, 2 mg at 12/12/21 1119 ?  nicotine (NICODERM CQ - dosed in mg/24 hours) patch 21 mg, 21 mg, Transdermal, Daily, Huel Cote, MD, 21 mg at 12/12/21 0910 ?  ondansetron (ZOFRAN) injection 4 mg, 4 mg, Intravenous, Q6H PRN, Huel Cote, MD ?  oxyCODONE-acetaminophen (PERCOCET/ROXICET) 5-325 MG per tablet 1-2 tablet, 1-2 tablet, Oral, Q4H PRN, Huel Cote, MD, 2 tablet at 12/12/21 1124 ?  polyethylene glycol (MIRALAX / GLYCOLAX) packet 17 g, 17 g, Oral, Daily PRN, Huel Cote, MD ?  rosuvastatin (CRESTOR) tablet 10 mg, 10 mg, Oral, Q M,W,F, Huel Cote, MD, 10 mg at 12/12/21 0910 ?  traZODone (DESYREL) tablet 100 mg, 100 mg, Oral, QHS PRN, Huel Cote, MD ? ?Patients Current Diet:  ?Diet Order   ? ?       ?  Diet - low sodium heart healthy       ?  ?  Diet Carb Modified Fluid consistency: Thin; Room service appropriate? Yes  Diet effective now       ?  ? ?  ?  ? ?  ? ? ?Precautions / Restrictions ?Precautions ?Precautions: Fall ?Other Brace: bledsoe brace locked in  extension at all times per secure chat with ortho MD ?Restrictions ?Weight Bearing Restrictions: Yes ?RUE Weight Bearing: Non weight bearing ?LUE Weight Bearing: Touch down weight bearing ?LLE Weight Bearing: Touchdown weight bearing ?Other Position/Activity Restrictions: Per ortho MD: "She may use her left arm for activity and ADLs"  ? ?Has the patient had 2 or more falls or a  fall with injury in the past year? Yes ? ?Prior Activity Level ?Community (5-7x/wk): Went out daily.  Worked FT and was driving. ? ?Prior Functional Level ?Self Care: Did the patient need help bathing, dressing, using the toilet or eating? Independent ? ?Indoor Mobility: Did the patient need assistance with walking from room to room (with or without device)? Independent ? ?Stairs: Did the patient need assistance with internal or external stairs (with or without device)? Independent ? ?Functional Cognition: Did the patient need help planning regular tasks such as shopping or remembering to take medications? Independent ? ?Patient Information ?Are you of Hispanic, Latino/a,or Spanish origin?: A. No, not of Hispanic, Latino/a, or Spanish origin ?What is your race?: A. White ?Do you need or want an interpreter to communicate with a doctor or health care staff?: 0. No ? ?Patient's Response To:  ?Health Literacy and Transportation ?Is the patient able to respond to health literacy and transportation needs?: Yes ?Health Literacy - How often do you need to have someone help you when you read instructions, pamphlets, or other written material from your doctor or pharmacy?: Rarely ?In the past 12 months, has lack of transportation kept you from medical appointments or from getting medications?: No ?In the past 12 months, has lack of transportation kept you from meetings, work, or from getting things needed for daily living?: No ? ?Home Assistive Devices / Equipment ?Home Assistive Devices/Equipment: None ?Home Equipment: None ? ?Prior Device Use:  Indicate devices/aids used by the patient prior to current illness, exacerbation or injury? None of the above ? ?Current Functional Level ?Cognition ? Overall Cognitive Status: Impaired/Different from baseline ?Curre

## 2021-12-11 NOTE — Progress Notes (Signed)
?PROGRESS NOTE ? ? ? ?Alexandra Parsons  AYT:[id number] DOB: 10/04/1960 DOA: 12/07/2021 ?PCP: Wanda Plump, MD  ? ?Brief Narrative:  ?The patient Alexandra Parsons is a 61 year old female with past medical history significant for type 2 diabetes mellitus, HTN, HLD, chronic pain, anxiety/depression, who presented to [facility] ED on 4/30 with complaint of bilateral arm pain and left knee pain.  She reports she was walking across a flat concrete surface when her shoe caught on the ground causing her to fall forward.  She was carrying objects in her hands at that time and when she fell she strike to both knees and elbows.  Denies loss of consciousness and did not feel lightheaded or dizzy at time of event.  She reports a previous fall roughly 3 weeks ago in which she did not get injured and attributes this to her vision and likely needs to see an eye doctor.  Patient received Dilaudid in the ED with a transient episode of hypoxia afterwards. ?  ?In the ED, temperature 98.5 ?F, HR 72, RR 18, BP 160/77, SPO2 100% on room air.  WBC 10.7, hemoglobin 12.2, platelets 174.  Sodium 137, potassium 4.3, chloride 103, CO2 28, glucose 126, BUN 14, creatinine 0.69.  Magnesium 1.8.  Left elbow x-ray with nondisplaced intra-articular fracture left radial head with associated moderate elbow joint effusion.  Right elbow with severely comminuted, slightly displaced fracture proximal right ulna.  Left knee x-ray with no fracture/dislocation with moderate to large effusion possible hemarthrosis in the suprapatellar bursa.  Orthopedics was consulted.  Patient was transferred to [facility] for surgical intervention.  Hospitalist service consulted for admission ? ?**Interim History ?Patient is status post open reduction internal fixation of the right olecranon also with a nondisplaced left tibial plateau fracture.  PT OT to optimize mobilization safety and they are recommending touchdown weightbearing on the left lower extremity  as she has a brace at home that they will plan the patient on her left leg locked in extension.  Per Ortho she is nonweightbearing the right upper extremity.  She had an MRI done which showed a nondisplaced tibial plateau fracture.  Pain was controlled on regimen but had worsened once her Nerve Block on her Right Arm wore off. PT/OT now recommending CIR.  ? ?Assessment and Plan: ? ?Right Comminuted Olecranon Fracture ?Left nondisplaced radial head fracture ?Left knee hemarthrosis with acute nondisplaced tibial plateau fracture ?-Patient presenting to ED following mechanical fall with pain to bilateral upper extremities and left knee. Left elbow x-ray with nondisplaced intra-articular fracture left radial head with associated moderate elbow joint effusion.   ?-Right elbow with severely comminuted, slightly displaced fracture proximal right ulna.   ?-Left knee x-ray with no fracture/dislocation with moderate to large effusion possible hemarthrosis in the suprapatellar bursa.  ?-Orthopedics following, appreciate assistance ?-C/w Oxycodone-Acetaminophen 5-325 mg 1-2 po q4hprn Moderate Pain ?-C/w Morphine 2 mg IV q2h PRN severe pain ?-C/w Methocarbamol 500 mg po/IV q6hprn as needed muscle spasms ?-She was N.p.o. for planned ORIF right elbow olecranon fracture and left knee aspiration yesterday ?-MRI Left knee done after aspiration showed "Acute, nondisplaced, fracture extending from the medial tibial spine inferiorly towards the  anterior lateral and the anterior mid-transverse portions of the  proximal  tibial metaphysis. Moderate lipohemarthrosis.  Intrasubstance degeneration within the medial and lateral menisci without definite tear extending through an articular surface. Mucoid  degeneration of the ACL.  No definite tear.  Moderate patellofemoral osteoarthritis." ?-Weightbearing status per orthopedic surgery  and they are recommending touchdown weightbearing on the left lower extremity and nonweightbearing right  upper extremity and she may use her left arm for activity and ADLs ?-VTE prophylaxis per orthopedic surgery and they have initiated the patient on aspirin 325 mg p.o. daily as well as SCDs and ambulation ?-She is given postoperative antibiotics with Ancef x2 additional doses ?-Continue to monitor for signs and symptoms of bleeding ?-Bowel Reigmen with Miralax 17 grams po Daily and Docusate 100 mg po BID along with Bisacodyl 5 mg po dailyPRN Moderate Constipation ?-PT/OT recommending Acute Inpatient Rehab (3 Hours/Day) with Equipment recommendations for wheelchair with wheelchair cushion and BSC/3in1 ?  ?Leukocytosis, improving  ?-Patient's WBC went from 10.7 -> 22.1 -> 14.4 ?-Likely reactive in the setting of Surgery and Pain ?-Continue to Monitor for S/Sx of Bleeding; No overt bleeding noted ?-Repeat CBC in the AM  ?  ?ABLA as a Post-Operative Drop ?-Patient's Hgb/Hct went from 12.2/36.4 -> 10.7/30.9 -> 11.0/31.7 ?-Check Anemia Panel in the AM  ?-Continue to Monitor for S/Sx of Bleeding; No overt Bleeding noted ?-Repeat CBC in the AM ?  ?Type 2 Diabetes Mellitus ?-Recent hemoglobin A1c, 6.2, well controlled.   ?-Home regimen includes metformin 1000 mg p.o. twice daily, pioglitazone 30 mg p.o. daily, sitagliptin 100 mg p.o. daily. ?-Continue Holding oral hypoglycemics while inpatient ?-Added Moderate Novolog SSI AC/HS for coverage ?-CBGs qAC/HS and now CBGs ranging from 118-192 ?  ?Essential Hypertension ?-Continue Bisoprolol-HCTZ 2.5-6.25 mg p.o. daily ?-Continue to Monitor BP per Protocol ?-Last BP reading was 139/71 ?  ?Hyperlipidemia ?-C/w Ezetimibe 10mg  PO daily ?-C/w Crestor 10mg  PO daily ?  ?Anxiety/Depression: ?-C/w Escitalopram 20 mg p.o. daily ?  ?Nicotine Use disorder ?-Counseled on need for cessation. ?-C/w Nicotine Patch 21 mg TD ? ?Hypomagnesemia ?-Patient's Mag Level was 1.6 ?-Replete with IV Mag Sulfate 2 grams ?-Continue to Monitor to and Replete as Necessary ?-Repeat Mag Level in the AM   ? ?Hypoalbuminemia  ?-Patient's Albumin Level is now 2.9 ?-Continue to Monitor and Trend ?-Repeat CMP in the AM  ? ?DVT prophylaxis: SCDs Start: 12/08/21 1350 ? ?  Code Status: DNR ?Family Communication: No family present at bedside  ? ?Disposition Plan:  ?Level of care: Med-Surg ?Status is: Inpatient ?Remains inpatient appropriate because: Needs Ortho Clearance and PT/OT recommending CIR  ? ?Consultants:  ?Orthopedic Surgery ? ?Procedures:   ?PROCEDURE by Dr. Huel CoteSteven Bokshan: 1.  Open reduction internal fixation right olecranon ?2.  Closed management of left radial head fracture ?3.  Exam under anesthesia with aspiration of left knee ? ?Antimicrobials:  ?Anti-infectives (From admission, onward)  ? ? Start     Dose/Rate Route Frequency Ordered Stop  ? 12/10/21 0600  ceFAZolin (ANCEF) IVPB 2g/100 mL premix       ? 2 g ?200 mL/hr over 30 Minutes Intravenous On call to O.R. 12/09/21 1252 12/09/21 1518  ? 12/09/21 2300  ceFAZolin (ANCEF) IVPB 2g/100 mL premix       ? 2 g ?200 mL/hr over 30 Minutes Intravenous Every 8 hours 12/09/21 1828 12/10/21 0918  ? ?  ?  ?Subjective: ?Seen and examined at bedside and states that her pain was worsened after her nerve block wore off.  She denies chest pain or shortness of breath.  States that she feels better now but had to receive IV morphine.  No nausea or vomiting.  No other concerns or complaints at this time. ? ?Objective: ?Vitals:  ? 12/10/21 0534 12/10/21 1942 12/11/21 0439 12/11/21 0806  ?BP: Marland Kitchen(!)  142/61 (!) 132/109 119/60 139/71  ?Pulse: (!) 57 65 62 (!) 53  ?Resp: 16 18 16 18   ?Temp: 98.3 ?F (36.8 ?C) 98.7 ?F (37.1 ?C) 98.7 ?F (37.1 ?C) 98.2 ?F (36.8 ?C)  ?TempSrc:  Oral  Oral  ?SpO2: 96% 95% 95% 98%  ?Weight:      ? ? ?Intake/Output Summary (Last 24 hours) at 12/11/2021 1141 ?Last data filed at 12/11/2021 1137 ?Gross per 24 hour  ?Intake 1375.4 ml  ?Output 1200 ml  ?Net 175.4 ml  ? ?Filed Weights  ? 12/07/21 1621  ?Weight: 77.6 kg  ? ?Examination: ?Physical  Exam: ? ?Constitutional: WN/WD overweight Caucasian female currently no acute distress appears calm ?Respiratory: Mildly diminished to auscultation bilaterally with coarse breath sounds, no wheezing, rales, rhonchi or crackles. Normal r

## 2021-12-11 NOTE — Progress Notes (Signed)
Physical Therapy Treatment ?Patient Details ?Name: Alexandra KinsmanJulie W Parsons ?MRN: [id number] ?DOB: 06/10/1961 ?Today's Date: 12/11/2021 ? ? ?History of Present Illness Pt is a 61 y.o. female admitted 12/07/21 after fall sustaining R olecranono fx, L radial head fx, L knee effusion without fx. S/p R olecranon ORIF, closed management L radial fx, L knee aspiration on 5/2. MRI L knee 5/3 showed acute, nondisplaced tibial spine fx, no definite ligament tear; plan for non-operative management. PMH includes DM2, HTN, chronic pain, anxiety/depression. ?  ?PT Comments  ? ? Pt progressing with mobility. Today's session focused on transfer training with various techniques attempted; pt requiring up to modA, continues to have difficulty maintaining LLE TDWB precautions. Pt remains limited by generalized weakness, decreased activity tolerance, poor balance strategies/postural reactions and impaired cognition. Pt motivated to participate and regain PLOF. Pt's daughter present and supportive. Continue to recommend intensive CIR-level therapies to maximize functional mobility and independence prior to return home. ?   ?Recommendations for follow up therapy are one component of a multi-disciplinary discharge planning process, led by the attending physician.  Recommendations may be updated based on patient status, additional functional criteria and insurance authorization. ? ?Follow Up Recommendations ? Acute inpatient rehab (3hours/day) ?  ?  ?Assistance Recommended at Discharge Intermittent Supervision/Assistance  ?Patient can return home with the following A little help with walking and/or transfers;A little help with bathing/dressing/bathroom;Assistance with cooking/housework;Assist for transportation;Help with stairs or ramp for entrance ?  ?Equipment Recommendations ? Wheelchair (measurements PT);Wheelchair cushion (measurements PT);BSC/3in1  ?  ?Recommendations for Other Services   ? ? ?  ?Precautions / Restrictions  Precautions ?Precautions: Fall ?Required Braces or Orthoses: Other Brace ?Knee Immobilizer - Left: On at all times ?Other Brace: bledsoe brace locked in extension at all times per secure chat with ortho MD ?Restrictions ?Weight Bearing Restrictions: Yes ?RUE Weight Bearing: Non weight bearing ?LLE Weight Bearing: Touchdown weight bearing ?Other Position/Activity Restrictions: Per ortho MD: "She may use her left arm for activity and ADLs"  ?  ? ?Mobility ? Bed Mobility ?Overal bed mobility: Needs Assistance ?Bed Mobility: Supine to Sit ?  ?  ?Supine to sit: Supervision, HOB elevated ?  ?  ?General bed mobility comments: cues to maintain RUE NWB as pt attempting to push with R hand, increased time and effort ?  ? ?Transfers ?Overall transfer level: Needs assistance ?Equipment used: None, 1 person hand held assist, Crutches ?Transfers: Bed to chair/wheelchair/BSC, Sit to/from Stand ?Sit to Stand: Mod assist ?Stand pivot transfers: Min assist ?Step pivot transfers: Mod assist ?  ?  ?  ?General transfer comment: initial stand pivot from bed<>BSC with minA for stability, pt unable to maintain LLE TDWB precautions; additional stand with single crutch for LUE, pt requiring modA for trunk elevation and stability, unable to maintain LLE TDWB precautions with attempt at hopping on RLE with LUE crutch, resulting in assist to help pt reach LUE to recliner in order to pivot to complete transfer; poor safety awareness requiring frequent multimodal cues for safety and external assist for LLE management ?  ? ?Ambulation/Gait ?  ?  ?  ?  ?  ?  ?  ?  ? ? ?Stairs ?  ?  ?  ?  ?  ? ? ?Wheelchair Mobility ?  ? ?Modified Rankin (Stroke Patients Only) ?  ? ? ?  ?Balance Overall balance assessment: Needs assistance ?Sitting-balance support: Feet supported ?Sitting balance-Leahy Scale: Good ?Sitting balance - Comments: able to perform pericare sitting on BSC ?  ?Standing  balance support: Single extremity supported, Reliant on assistive device  for balance ?Standing balance-Leahy Scale: Poor ?Standing balance comment: reliant on external assist to complete ADL task with LUE; pt unable to maintain static standing on single RLE without UE support ?  ?  ?  ?  ?  ?  ?  ?  ?  ?  ?  ?  ? ?  ?Cognition Arousal/Alertness: Awake/alert ?Behavior During Therapy: Restless, Anxious ?Overall Cognitive Status: Impaired/Different from baseline ?Area of Impairment: Safety/judgement, Following commands, Problem solving, Attention, Awareness ?  ?  ?  ?  ?  ?  ?  ?  ?  ?Current Attention Level: Sustained, Selective ?  ?Following Commands: Follows one step commands inconsistently ?Safety/Judgement: Decreased awareness of safety, Decreased awareness of deficits ?Awareness: Emergent ?Problem Solving: Difficulty sequencing, Requires verbal cues, Requires tactile cues ?General Comments: pt and daughter agree that pt is "foggy" - pt endorses decreased attention ("ADD") is baseline ("you've got to be tough with me") ?  ?  ? ?  ?Exercises General Exercises - Lower Extremity ?Ankle Circles/Pumps: AROM, Both, Seated ?Hip ABduction/ADduction: AROM, Left, Seated ?Straight Leg Raises: AROM, Left, 10 reps, Seated ?Other Exercises ?Other Exercises: [facility] HEP handout provided (Access Code [email or web address]) - SLR, quad set, hip abd, adductor squeeze, glut squeeze ? ?  ?General Comments General comments (skin integrity, edema, etc.): pt's daughter present and supportive. Recevied pt with bledsoe brace unlocked; locked in extension and educ pt/daughter on MD's orders for this ?  ?  ? ?Pertinent Vitals/Pain Pain Assessment ?Pain Assessment: Faces ?Faces Pain Scale: Hurts little more ?Pain Location: RUE, LLE, L wrist ?Pain Descriptors / Indicators: Pressure, Spasm, Heaviness ?Pain Intervention(s): Monitored during session, Limited activity within patient's tolerance, Repositioned  ? ? ?Home Living   ?  ?  ?  ?  ?  ?  ?  ?  ?  ?   ?  ?Prior Function    ?  ?  ?   ? ?PT Goals (current goals can now be  found in the care plan section) Progress towards PT goals: Progressing toward goals ? ?  ?Frequency ? ? ? Min 5X/week ? ? ? ?  ?PT Plan Current plan remains appropriate  ? ? ?Co-evaluation   ?  ?  ?  ?  ? ?  ?AM-PAC PT "6 Clicks" Mobility   ?Outcome Measure ? Help needed turning from your back to your side while in a flat bed without using bedrails?: A Little ?Help needed moving from lying on your back to sitting on the side of a flat bed without using bedrails?: A Little ?Help needed moving to and from a bed to a chair (including a wheelchair)?: A Lot ?Help needed standing up from a chair using your arms (e.g., wheelchair or bedside chair)?: A Lot ?Help needed to walk in hospital room?: Total ?Help needed climbing 3-5 steps with a railing? : Total ?6 Click Score: 12 ? ?  ?End of Session Equipment Utilized During Treatment: Gait belt ?Activity Tolerance: Patient tolerated treatment well ?Patient left: in chair;with call bell/phone within reach;with chair alarm set;with family/visitor present ?Nurse Communication: Mobility status ?PT Visit Diagnosis: Other abnormalities of gait and mobility (R26.89);Pain ?  ? ? ?Time: 7824-2353 ?PT Time Calculation (min) (ACUTE ONLY): 31 min ? ?Charges:  $Therapeutic Exercise: 8-22 mins ?$Therapeutic Activity: 8-22 mins          ?          ? ?Ina Homes, PT, DPT ?Acute Rehabilitation  Services  ?Pager [phone] ?Office [phone] ? ?Malachy Chamber ?12/11/2021, 3:28 PM ? ?

## 2021-12-11 NOTE — Progress Notes (Signed)
IP rehab admissions - I met with patient and her daughter at the bedside.  Patient did fall asleep during our discussion.  Daughter tells me that this is a workers comp case.  Patient did fall while at work.  I will contact workers comp and request acute inpatient rehab admission.  Call for questions.  [phone] ?

## 2021-12-11 NOTE — Progress Notes (Signed)
? ?  Subjective: ? ?Nerve block worn off on right arm with subsequent increased in pain, controlled this AM on current medication regimen. Worked with PT and was able to get up with walker. ? ? ?Objective:  ? ?VITALS:   ?Vitals:  ? 12/09/21 1917 12/10/21 0534 12/10/21 1942 12/11/21 0439  ?BP: (!) 128/56 (!) 142/61 (!) 132/109 119/60  ?Pulse: 66 (!) 57 65 62  ?Resp: 16 16 18 16   ?Temp: 98.1 ?F (36.7 ?C) 98.3 ?F (36.8 ?C) 98.7 ?F (37.1 ?C) 98.7 ?F (37.1 ?C)  ?TempSrc: Oral  Oral   ?SpO2: 97% 96% 95% 95%  ?Weight:      ? ? ? ? ?Lab Results  ?Component Value Date  ? WBC 22.1 (H) 12/10/2021  ? HGB 10.7 (L) 12/10/2021  ? HCT 30.9 (L) 12/10/2021  ? MCV 93.9 12/10/2021  ? PLT 159 12/10/2021  ? ? ?Right elbow is in a splint.  Nerve block is currently in effect.  2+ radial pulse. ?  ?Left elbow she has range of motion from 0-125.   She has full pro supination.  2+ radial pulse.  Sensation is intact distally.  Tender about the radial head. ?  ?Left knee with improved effusion.  ROM is from 0-90. Able to fire EHL as well as tibialis anterior and gastrocsoleus.  2+ dorsalis pedis pulse. Tenderness about proximal tibia ?  ? ? ?Assessment/Plan: ? ?2 Days Post-Op status post open reduction internal fixation of right olecranon also with a left nondisplaced tibial plateau fracture ? ?- Expected postop acute blood loss anemia - will monitor for symptoms ?- Patient to work with PT/OT to optimize mobilization safely -she will be TDWB on the left lower extremity.  She has a brace at home that we will plan to place on her left leg locked in extension ?- DVT ppx - SCDs, ambulation, aspirin 325 daily ?-Nonweightbearing right upper extremity.  She may use her left arm for activity and ADLs.  TDWB left lower extremity ?- Discharge planning pending CM, appreciate coordination  ? ?Alexandra Parsons ?12/11/2021, 7:26 AM ? ?

## 2021-12-12 ENCOUNTER — Inpatient Hospital Stay (HOSPITAL_COMMUNITY)
Admission: RE | Admit: 2021-12-12 | Discharge: 2021-12-26 | DRG: 560 | Disposition: A | Discharge status: Home / Self Care | Payer: BC Managed Care – PPO | Source: Intra-hospital | Attending: Physical Medicine & Rehabilitation | Admitting: Physical Medicine & Rehabilitation

## 2021-12-12 ENCOUNTER — Other Ambulatory Visit (HOSPITAL_BASED_OUTPATIENT_CLINIC_OR_DEPARTMENT_OTHER): Payer: Self-pay

## 2021-12-12 ENCOUNTER — Other Ambulatory Visit: Payer: Self-pay

## 2021-12-12 ENCOUNTER — Encounter (HOSPITAL_COMMUNITY): Payer: Self-pay | Admitting: Physical Medicine & Rehabilitation

## 2021-12-12 DIAGNOSIS — D62 Acute posthemorrhagic anemia: Secondary | ICD-10-CM | POA: Diagnosis present

## 2021-12-12 DIAGNOSIS — E785 Hyperlipidemia, unspecified: Secondary | ICD-10-CM | POA: Diagnosis present

## 2021-12-12 DIAGNOSIS — Z833 Family history of diabetes mellitus: Secondary | ICD-10-CM | POA: Diagnosis not present

## 2021-12-12 DIAGNOSIS — S52021D Displaced fracture of olecranon process without intraarticular extension of right ulna, subsequent encounter for closed fracture with routine healing: Secondary | ICD-10-CM | POA: Diagnosis present

## 2021-12-12 DIAGNOSIS — R300 Dysuria: Secondary | ICD-10-CM | POA: Diagnosis not present

## 2021-12-12 DIAGNOSIS — M797 Fibromyalgia: Secondary | ICD-10-CM | POA: Diagnosis present

## 2021-12-12 DIAGNOSIS — G8918 Other acute postprocedural pain: Secondary | ICD-10-CM

## 2021-12-12 DIAGNOSIS — Z79899 Other long term (current) drug therapy: Secondary | ICD-10-CM

## 2021-12-12 DIAGNOSIS — G8929 Other chronic pain: Secondary | ICD-10-CM | POA: Diagnosis present

## 2021-12-12 DIAGNOSIS — N390 Urinary tract infection, site not specified: Secondary | ICD-10-CM | POA: Diagnosis not present

## 2021-12-12 DIAGNOSIS — S82142D Displaced bicondylar fracture of left tibia, subsequent encounter for closed fracture with routine healing: Secondary | ICD-10-CM | POA: Diagnosis not present

## 2021-12-12 DIAGNOSIS — F17210 Nicotine dependence, cigarettes, uncomplicated: Secondary | ICD-10-CM | POA: Diagnosis present

## 2021-12-12 DIAGNOSIS — Z7984 Long term (current) use of oral hypoglycemic drugs: Secondary | ICD-10-CM

## 2021-12-12 DIAGNOSIS — I10 Essential (primary) hypertension: Secondary | ICD-10-CM | POA: Diagnosis present

## 2021-12-12 DIAGNOSIS — E119 Type 2 diabetes mellitus without complications: Secondary | ICD-10-CM | POA: Diagnosis present

## 2021-12-12 DIAGNOSIS — Z888 Allergy status to other drugs, medicaments and biological substances status: Secondary | ICD-10-CM | POA: Diagnosis not present

## 2021-12-12 DIAGNOSIS — F32A Depression, unspecified: Secondary | ICD-10-CM | POA: Diagnosis present

## 2021-12-12 DIAGNOSIS — K5909 Other constipation: Secondary | ICD-10-CM | POA: Diagnosis present

## 2021-12-12 DIAGNOSIS — W1830XD Fall on same level, unspecified, subsequent encounter: Secondary | ICD-10-CM | POA: Diagnosis not present

## 2021-12-12 DIAGNOSIS — S52122D Displaced fracture of head of left radius, subsequent encounter for closed fracture with routine healing: Secondary | ICD-10-CM | POA: Diagnosis not present

## 2021-12-12 DIAGNOSIS — Y9289 Other specified places as the place of occurrence of the external cause: Secondary | ICD-10-CM

## 2021-12-12 DIAGNOSIS — M25532 Pain in left wrist: Secondary | ICD-10-CM | POA: Diagnosis not present

## 2021-12-12 DIAGNOSIS — G894 Chronic pain syndrome: Secondary | ICD-10-CM | POA: Diagnosis not present

## 2021-12-12 DIAGNOSIS — E8809 Other disorders of plasma-protein metabolism, not elsewhere classified: Secondary | ICD-10-CM | POA: Diagnosis present

## 2021-12-12 DIAGNOSIS — F419 Anxiety disorder, unspecified: Secondary | ICD-10-CM | POA: Diagnosis present

## 2021-12-12 DIAGNOSIS — Z881 Allergy status to other antibiotic agents status: Secondary | ICD-10-CM

## 2021-12-12 DIAGNOSIS — R5381 Other malaise: Secondary | ICD-10-CM | POA: Diagnosis present

## 2021-12-12 DIAGNOSIS — E7800 Pure hypercholesterolemia, unspecified: Secondary | ICD-10-CM | POA: Diagnosis not present

## 2021-12-12 DIAGNOSIS — N3000 Acute cystitis without hematuria: Secondary | ICD-10-CM | POA: Diagnosis not present

## 2021-12-12 DIAGNOSIS — F4323 Adjustment disorder with mixed anxiety and depressed mood: Secondary | ICD-10-CM | POA: Diagnosis not present

## 2021-12-12 DIAGNOSIS — K5900 Constipation, unspecified: Secondary | ICD-10-CM | POA: Diagnosis not present

## 2021-12-12 DIAGNOSIS — E1169 Type 2 diabetes mellitus with other specified complication: Secondary | ICD-10-CM | POA: Diagnosis not present

## 2021-12-12 DIAGNOSIS — Z8249 Family history of ischemic heart disease and other diseases of the circulatory system: Secondary | ICD-10-CM

## 2021-12-12 DIAGNOSIS — T07XXXA Unspecified multiple injuries, initial encounter: Secondary | ICD-10-CM | POA: Diagnosis present

## 2021-12-12 DIAGNOSIS — R3915 Urgency of urination: Secondary | ICD-10-CM | POA: Diagnosis not present

## 2021-12-12 DIAGNOSIS — S82102D Unspecified fracture of upper end of left tibia, subsequent encounter for closed fracture with routine healing: Secondary | ICD-10-CM | POA: Diagnosis not present

## 2021-12-12 DIAGNOSIS — R0789 Other chest pain: Secondary | ICD-10-CM | POA: Diagnosis not present

## 2021-12-12 DIAGNOSIS — W19XXXA Unspecified fall, initial encounter: Secondary | ICD-10-CM | POA: Diagnosis not present

## 2021-12-12 DIAGNOSIS — M79602 Pain in left arm: Secondary | ICD-10-CM | POA: Diagnosis not present

## 2021-12-12 LAB — COMPREHENSIVE METABOLIC PANEL
ALT: 10 U/L (ref 0–44)
AST: 20 U/L (ref 15–41)
Albumin: 2.7 g/dL — ABNORMAL LOW (ref 3.5–5.0)
Alkaline Phosphatase: 49 U/L (ref 38–126)
Anion gap: 6 (ref 5–15)
BUN: 20 mg/dL (ref 8–23)
CO2: 27 mmol/L (ref 22–32)
Calcium: 8.4 mg/dL — ABNORMAL LOW (ref 8.9–10.3)
Chloride: 105 mmol/L (ref 98–111)
Creatinine, Ser: 0.88 mg/dL (ref 0.44–1.00)
GFR, Estimated: >60 mL/min (ref >60)
Glucose, Bld: 159 mg/dL — ABNORMAL HIGH (ref 70–99)
Potassium: 4.7 mmol/L (ref 3.5–5.1)
Sodium: 138 mmol/L (ref 135–145)
Total Bilirubin: 0.8 mg/dL (ref 0.3–1.2)
Total Protein: 5.7 g/dL — ABNORMAL LOW (ref 6.5–8.1)

## 2021-12-12 LAB — MAGNESIUM: Magnesium: 1.9 mg/dL (ref 1.7–2.4)

## 2021-12-12 LAB — RETICULOCYTES
Immature Retic Fract: 12.6 % (ref 2.3–15.9)
RBC.: 3.19 MIL/uL — ABNORMAL LOW (ref 3.87–5.11)
Retic Count, Absolute: 70.8 10*3/uL (ref 19.0–186.0)
Retic Ct Pct: 2.2 % (ref 0.4–3.1)

## 2021-12-12 LAB — CBC WITH DIFFERENTIAL/PLATELET
Abs Immature Granulocytes: 0.03 10*3/uL (ref 0.00–0.07)
Basophils Absolute: 0 10*3/uL (ref 0.0–0.1)
Basophils Relative: 0 %
Eosinophils Absolute: 0.2 10*3/uL (ref 0.0–0.5)
Eosinophils Relative: 2 %
HCT: 30.8 % — ABNORMAL LOW (ref 36.0–46.0)
Hemoglobin: 10.3 g/dL — ABNORMAL LOW (ref 12.0–15.0)
Immature Granulocytes: 0 %
Lymphocytes Relative: 29 %
Lymphs Abs: 2.9 10*3/uL (ref 0.7–4.0)
MCH: 32.4 pg (ref 26.0–34.0)
MCHC: 33.4 g/dL (ref 30.0–36.0)
MCV: 96.9 fL (ref 80.0–100.0)
Monocytes Absolute: 0.7 10*3/uL (ref 0.1–1.0)
Monocytes Relative: 7 %
Neutro Abs: 6 10*3/uL (ref 1.7–7.7)
Neutrophils Relative %: 62 %
Platelets: 183 10*3/uL (ref 150–400)
RBC: 3.18 MIL/uL — ABNORMAL LOW (ref 3.87–5.11)
RDW: 14.1 % (ref 11.5–15.5)
WBC: 9.9 10*3/uL (ref 4.0–10.5)
nRBC: 0 % (ref 0.0–0.2)

## 2021-12-12 LAB — GLUCOSE, CAPILLARY
Glucose-Capillary: 134 mg/dL — ABNORMAL HIGH (ref 70–99)
Glucose-Capillary: 127 mg/dL — ABNORMAL HIGH (ref 70–99)
Glucose-Capillary: 139 mg/dL — ABNORMAL HIGH (ref 70–99)
Glucose-Capillary: 200 mg/dL — ABNORMAL HIGH (ref 70–99)

## 2021-12-12 LAB — IRON AND TIBC
Iron: 48 ug/dL (ref 28–170)
Saturation Ratios: 20 % (ref 10.4–31.8)
TIBC: 235 ug/dL — ABNORMAL LOW (ref 250–450)
UIBC: 187 ug/dL

## 2021-12-12 LAB — FOLATE: Folate: 10.2 ng/mL (ref >5.9)

## 2021-12-12 LAB — PHOSPHORUS: Phosphorus: 4.3 mg/dL (ref 2.5–4.6)

## 2021-12-12 LAB — VITAMIN B12: Vitamin B-12: 226 pg/mL (ref 180–914)

## 2021-12-12 LAB — FERRITIN: Ferritin: 50 ng/mL (ref 11–307)

## 2021-12-12 MED ORDER — TRAZODONE HCL 50 MG PO TABS
25.0000 mg | ORAL_TABLET | Freq: Every evening | ORAL | Status: DC | PRN
  Administered 2021-12-12 – 2021-12-20 (×6): 50 mg via ORAL
  Filled 2021-12-12 (×7): qty 1

## 2021-12-12 MED ORDER — POLYETHYLENE GLYCOL 3350 17 GM/SCOOP PO POWD
17.0000 g | Freq: Every day | ORAL | 0 refills | Status: DC | PRN
  Filled 2021-12-12: qty 238, 14d supply, fill #0

## 2021-12-12 MED ORDER — ASPIRIN 325 MG PO TABS
325.0000 mg | ORAL_TABLET | Freq: Every day | ORAL | Status: DC
Start: 2021-12-13 — End: 2021-12-26
  Administered 2021-12-13 – 2021-12-26 (×14): 325 mg via ORAL
  Filled 2021-12-12 (×14): qty 1

## 2021-12-12 MED ORDER — ESCITALOPRAM OXALATE 10 MG PO TABS
20.0000 mg | ORAL_TABLET | Freq: Every day | ORAL | Status: DC
  Administered 2021-12-13 – 2021-12-26 (×14): 20 mg via ORAL
  Filled 2021-12-12 (×14): qty 2

## 2021-12-12 MED ORDER — OXYCODONE HCL 5 MG PO TABS
10.0000 mg | ORAL_TABLET | ORAL | Status: DC | PRN
  Administered 2021-12-12 – 2021-12-16 (×18): 15 mg via ORAL
  Administered 2021-12-16: 10 mg via ORAL
  Administered 2021-12-17 – 2021-12-25 (×39): 15 mg via ORAL
  Administered 2021-12-25: 10 mg via ORAL
  Administered 2021-12-25: 15 mg via ORAL
  Administered 2021-12-25 – 2021-12-26 (×3): 10 mg via ORAL
  Filled 2021-12-12 (×9): qty 3
  Filled 2021-12-12: qty 2
  Filled 2021-12-12: qty 3
  Filled 2021-12-12: qty 2
  Filled 2021-12-12 (×6): qty 3
  Filled 2021-12-12: qty 2
  Filled 2021-12-12 (×6): qty 3
  Filled 2021-12-12: qty 2
  Filled 2021-12-12 (×10): qty 3
  Filled 2021-12-12: qty 2
  Filled 2021-12-12 (×19): qty 3
  Filled 2021-12-12: qty 2
  Filled 2021-12-12 (×7): qty 3

## 2021-12-12 MED ORDER — ROSUVASTATIN CALCIUM 5 MG PO TABS
10.0000 mg | ORAL_TABLET | ORAL | Status: DC
  Administered 2021-12-15 – 2021-12-26 (×6): 10 mg via ORAL
  Filled 2021-12-12 (×6): qty 2

## 2021-12-12 MED ORDER — ENSURE ENLIVE PO LIQD: 237.0000 mL | Freq: Two times a day (BID) | ORAL | 12 refills | Status: DC

## 2021-12-12 MED ORDER — INSULIN ASPART 100 UNIT/ML IJ SOLN
0.0000 [IU] | Freq: Every day | INTRAMUSCULAR | Status: DC
  Administered 2021-12-14: 2 [IU] via SUBCUTANEOUS

## 2021-12-12 MED ORDER — BLOOD PRESSURE CONTROL BOOK
Freq: Once | Status: AC
  Filled 2021-12-12: qty 1

## 2021-12-12 MED ORDER — POLYETHYLENE GLYCOL 3350 17 G PO PACK
17.0000 g | PACK | Freq: Every day | ORAL | Status: DC | PRN
  Administered 2021-12-18: 17 g via ORAL
  Filled 2021-12-12: qty 1

## 2021-12-12 MED ORDER — ALUM & MAG HYDROXIDE-SIMETH 200-200-20 MG/5ML PO SUSP
30.0000 mL | ORAL | Status: DC | PRN
  Administered 2021-12-16: 30 mL via ORAL
  Filled 2021-12-12: qty 30

## 2021-12-12 MED ORDER — SORBITOL 70 % SOLN
30.0000 mL | Freq: Every day | Status: DC | PRN
Start: 2021-12-12 — End: 2021-12-26
  Administered 2021-12-17: 30 mL via ORAL
  Filled 2021-12-12: qty 30

## 2021-12-12 MED ORDER — LIVING WELL WITH DIABETES BOOK
Freq: Once | Status: AC
  Filled 2021-12-12: qty 1

## 2021-12-12 MED ORDER — METHOCARBAMOL 500 MG PO TABS
500.0000 mg | ORAL_TABLET | Freq: Four times a day (QID) | ORAL | Status: DC | PRN
  Administered 2021-12-14 – 2021-12-26 (×4): 500 mg via ORAL
  Filled 2021-12-12 (×5): qty 1

## 2021-12-12 MED ORDER — PROCHLORPERAZINE MALEATE 5 MG PO TABS: 5.0000 mg | ORAL_TABLET | Freq: Four times a day (QID) | ORAL | Status: DC | PRN

## 2021-12-12 MED ORDER — DOCUSATE SODIUM 100 MG PO CAPS
100.0000 mg | ORAL_CAPSULE | Freq: Two times a day (BID) | ORAL | Status: DC
  Administered 2021-12-12 – 2021-12-26 (×28): 100 mg via ORAL
  Filled 2021-12-12 (×28): qty 1

## 2021-12-12 MED ORDER — NICOTINE 21 MG/24HR TD PT24: 21.0000 mg | MEDICATED_PATCH | Freq: Every day | TRANSDERMAL | 0 refills | Status: DC

## 2021-12-12 MED ORDER — NICOTINE 21 MG/24HR TD PT24
21.0000 mg | MEDICATED_PATCH | Freq: Every day | TRANSDERMAL | Status: DC
  Administered 2021-12-13 – 2021-12-26 (×14): 21 mg via TRANSDERMAL
  Filled 2021-12-12 (×14): qty 1

## 2021-12-12 MED ORDER — EZETIMIBE 10 MG PO TABS
10.0000 mg | ORAL_TABLET | Freq: Every day | ORAL | Status: DC
  Administered 2021-12-13 – 2021-12-26 (×14): 10 mg via ORAL
  Filled 2021-12-12 (×14): qty 1

## 2021-12-12 MED ORDER — ASPIRIN 325 MG PO TABS: 325.0000 mg | ORAL_TABLET | Freq: Every day | ORAL | Status: DC

## 2021-12-12 MED ORDER — INSULIN ASPART 100 UNIT/ML IJ SOLN
0.0000 [IU] | Freq: Three times a day (TID) | INTRAMUSCULAR | Status: DC
  Administered 2021-12-12: 2 [IU] via SUBCUTANEOUS
  Administered 2021-12-13 – 2021-12-14 (×5): 3 [IU] via SUBCUTANEOUS
  Administered 2021-12-14: 2 [IU] via SUBCUTANEOUS
  Administered 2021-12-15 (×3): 3 [IU] via SUBCUTANEOUS
  Administered 2021-12-16: 2 [IU] via SUBCUTANEOUS
  Administered 2021-12-16: 3 [IU] via SUBCUTANEOUS
  Administered 2021-12-16 – 2021-12-17 (×2): 2 [IU] via SUBCUTANEOUS
  Administered 2021-12-17: 3 [IU] via SUBCUTANEOUS
  Administered 2021-12-17 – 2021-12-19 (×3): 2 [IU] via SUBCUTANEOUS
  Administered 2021-12-19: 3 [IU] via SUBCUTANEOUS
  Administered 2021-12-19 – 2021-12-20 (×2): 2 [IU] via SUBCUTANEOUS
  Administered 2021-12-20: 3 [IU] via SUBCUTANEOUS
  Administered 2021-12-20 – 2021-12-22 (×5): 2 [IU] via SUBCUTANEOUS
  Administered 2021-12-23: 3 [IU] via SUBCUTANEOUS
  Administered 2021-12-23 – 2021-12-26 (×4): 2 [IU] via SUBCUTANEOUS

## 2021-12-12 MED ORDER — BISOPROLOL-HYDROCHLOROTHIAZIDE 2.5-6.25 MG PO TABS
1.0000 | ORAL_TABLET | Freq: Every day | ORAL | Status: DC
  Administered 2021-12-14 – 2021-12-19 (×6): 1 via ORAL
  Filled 2021-12-12 (×8): qty 1

## 2021-12-12 MED ORDER — FLEET ENEMA 7-19 GM/118ML RE ENEM: 1.0000 | ENEMA | Freq: Once | RECTAL | Status: DC | PRN

## 2021-12-12 MED ORDER — METHOCARBAMOL 500 MG PO TABS: 500.0000 mg | ORAL_TABLET | Freq: Four times a day (QID) | ORAL | Status: DC | PRN

## 2021-12-12 MED ORDER — BISACODYL 5 MG PO TBEC
5.0000 mg | DELAYED_RELEASE_TABLET | Freq: Every day | ORAL | 0 refills | Status: DC | PRN
Start: 2021-12-12 — End: 2021-12-26

## 2021-12-12 MED ORDER — GUAIFENESIN-DM 100-10 MG/5ML PO SYRP: 5.0000 mL | ORAL_SOLUTION | Freq: Four times a day (QID) | ORAL | Status: DC | PRN

## 2021-12-12 MED ORDER — PROCHLORPERAZINE 25 MG RE SUPP: 12.5000 mg | Freq: Four times a day (QID) | RECTAL | Status: DC | PRN

## 2021-12-12 MED ORDER — ACETAMINOPHEN 325 MG PO TABS
325.0000 mg | ORAL_TABLET | ORAL | Status: DC | PRN
  Administered 2021-12-12 – 2021-12-23 (×52): 650 mg via ORAL
  Administered 2021-12-24: 325 mg via ORAL
  Administered 2021-12-24 – 2021-12-26 (×10): 650 mg via ORAL
  Filled 2021-12-12 (×64): qty 2

## 2021-12-12 MED ORDER — PROCHLORPERAZINE EDISYLATE 10 MG/2ML IJ SOLN: 5.0000 mg | Freq: Four times a day (QID) | INTRAMUSCULAR | Status: DC | PRN

## 2021-12-12 NOTE — Progress Notes (Signed)
?Horton Chin, MD  ?Physician ?CASE MANAGEMENT ?PMR Pre-admission    ?Signed ?Date of Service:  12/11/2021  3:40 PM ? Related encounter: ED to Hosp-Admission (Discharged) from 12/07/2021 in [facility] ?  ?Signed    ?  ?   ?   ?   ?   ?   ?   ?   ?   ?   ?   ?   ?   ?   ?   ?   ?   ?   ?   ?   ?   ?   ?   ?   ?   ?   ?   ?   ?   ?   ?   ?   ?   ?   ?   ?   ?   ?   ?   ?   ?   ?   ?   ?   ?   ?   ?   ?   ?   ?   ?   ?   ?   ?   ?   ?   ?   ?   ?   ?   ?   ?   ?   ?   ?   ?   ?   ?   ?   ?   ?   ?   ?   ?   ?   ?   ?   ?   ?   ?   ?   ?   ?   ?   ?   ?   ?   ?   ?   ?   ?   ?   ?   ?   ?   ?   ?   ?   ?   ?   ?   ?   ?   ?   ?   ?   ?   ?   ?   ?   ?   ?   ?   ?   ?   ?   ?   ?   ?   ?   ?   ?   ?   ?   ?   ?   ?   ?   ?   ?   ?   ?   ?   ?   ?   ?   ?   ?   ?   ?   ?   ?   ?   ?   ?   ?   ?   ?   ?   ?   ?   ?   ?   ?   ?PMR Admission Coordinator Pre-Admission Assessment ?  ?Patient: Alexandra Parsons is an 61 y.o., female ?MRN: [id number] ?DOB: [birth year] ?Height: 5'7" ?Weight: 77.6 kg ?  ?Insurance Information ?HMO:     PPO:      PCP:      IPA:      80/20:      OTHER:  ?PRIMARY: Workers comp      Policy#: [id number]      Subscriber: patient ?CM Name: Idelle Jo      Phone#: [phone]     Fax#: [phone] ?  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 Date:      Deduct:       Out of Pocket Max:       Life Max:  ?CIR:       SNF:  ?Outpatient:      Co-Pay:  ?Home Health:       Co-Pay:  ?DME:      Co-Pay:  ?Providers: per Workers comp ?Claims Adjuster is Maryclare Bean at [id number] ? ?SECONDARY:       Policy#:      Phone#:  ?  ?Financial Counselor:       Phone#:  ?  ?The ?Data Collection Information Summary? for patients in Inpatient Rehabilitation Facilities with attached ?Privacy Act Statement-Health Care Records? was provided and verbally  reviewed with: N/A ?  ?Emergency Contact Information ?Contact Information   ?  ?  Name Relation Home Work Mobile  ?  Nicolette Bang Daughter [phone]      ?  ?   ?  ?  ?Current Medical History  ?Patient Admitting Diagnosis: Fall with R olecranon fracture, L radial head fracture and L knee effusion ?  ?History of Present Illness:  A 61 y.o. female who presents with bilateral elbow pain as well as left knee pain.  She had a fall on the day prior at which point she states that the left foot got caught underneath her and gave out.  She fell forward landing on both arms and left knee.  She subsequently has had difficulty moving bilateral arms and left knee.  She was initially able to place weight on the left knee although there is subsequently been significant swelling at which point she was no longer able to move or place weight on this.  She was placed in a knee immobilizer on the left knee in the emergency room.  She is placed in a splint for her right elbow.  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 She does have a history of chronic pain for which she is on long-term narcotics for what she states is attributable to arthritis.  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  She is here with her daughter. Admitted to [facility] on 12/07/21.  Underwent ORIF R olecranon fracture on 12/09/21 by Dr. Steward Drone along with closed management of left radial head fracture and aspiration of left knee.  PT/OT evaluations completed with recommendations for inpatient rehab admission. ?  ?  ?Patient's medical record from [facility] has been reviewed by the rehabilitation admission coordinator and physician. ?  ?Past Medical History  ?    ?Past Medical History:  ?Diagnosis Date  ? Abnormal Pap smear of cervix    ?  h/o HPV  ? Chronic pain disorder    ?  arthitis and fibromyalgia  ? Chronic sinusitis, chronic cough (?COPD) 07/16/2009  ? Depression    ? Diabetes mellitus    ?  type 2  ? Dyslipidemia    ?  Elevated LFTs 11/19/2011  ? Hypertension    ? Leukocytosis 11/19/2011  ? Menopausal syndrome    ? Neck pain 05/20/2011  ? Sarcoid    ?  post partum in remission  ? Submandibular gland swelling    ?  Right  ?  ?  ?Has the patient had major  surgery during 100 days prior to admission? Yes ?  ?Family History   ?family history includes CAD (age of onset: 13) in her maternal grandfather; Colon cancer in an other family member; Diabetes in an other family member; Hypertension in an other family member. ?  ?Current Medications ?  ?Current Facility-Administered Medications:  ?  aspirin tablet 325 mg, 325 mg, Oral, Daily, Huel Cote, MD, 325 mg at 12/12/21 0910 ?  bisacodyl (DULCOLAX) EC tablet 5 mg, 5 mg, Oral, Daily PRN, Huel Cote, MD ?  bisoprolol-hydrochlorothiazide [facility]) 2.5-6.25 MG per tablet 1 tablet, 1 tablet, Oral, Daily, Huel Cote, MD, 1 tablet at 12/12/21 8315 ?  docusate sodium (COLACE) capsule 100 mg, 100 mg, Oral, BID, Huel Cote, MD, 100 mg at 12/12/21 1761 ?  escitalopram (LEXAPRO) tablet 20 mg, 20 mg, Oral, Daily, Huel Cote, MD, 20 mg at 12/12/21 6073 ?  ezetimibe (ZETIA) tablet 10 mg, 10 mg, Oral, Daily, Huel Cote, MD, 10 mg at 12/12/21 0910 ?  feeding supplement (ENSURE ENLIVE / ENSURE PLUS) liquid 237 mL, 237 mL, Oral, BID BM, Huel Cote, MD, 237 mL at 12/12/21 0910 ?  insulin aspart (novoLOG) injection 0-15 Units, 0-15 Units, Subcutaneous, TID WC, Huel Cote, MD, 2 Units at 12/12/21 0815 ?  insulin aspart (novoLOG) injection 0-5 Units, 0-5 Units, Subcutaneous, QHS, Huel Cote, MD ?  methocarbamol (ROBAXIN) tablet 500 mg, 500 mg, Oral, Q6H PRN, 500 mg at 12/12/21 0648 **OR** methocarbamol (ROBAXIN) 500 mg in dextrose 5 % 50 mL IVPB, 500 mg, Intravenous, Q6H PRN, Huel Cote, MD ?  morphine (PF) 2 MG/ML injection 2 mg, 2 mg, Intravenous, Q2H PRN, Huel Cote, MD, 2 mg at 12/12/21 1119 ?  nicotine (NICODERM CQ - dosed in mg/24 hours) patch 21 mg, 21  mg, Transdermal, Daily, Huel Cote, MD, 21 mg at 12/12/21 0910 ?  ondansetron (ZOFRAN) injection 4 mg, 4 mg, Intravenous, Q6H PRN, Huel Cote, MD ?  oxyCODONE-acetaminophen (PERCOCET/ROXICET) 5-325 MG per tablet 1-2 tablet, 1-2 tablet, Oral, Q4H PRN, Huel Cote, MD, 2 tablet at 12/12/21 1124 ?  polyethylene glycol (MIRALAX / GLYCOLAX) packet 17 g, 17 g, Oral, Daily PRN, Huel Cote, MD ?  rosuvastatin (CRESTOR) tablet 10 mg, 10 mg, Oral, Q M,W,F, Huel Cote, MD, 10 mg at 12/12/21 0910 ?  traZODone (DESYREL) tablet 100 mg, 100 mg, Oral, QHS PRN, Huel Cote, MD ?  ?Patients Current Diet:  ?Diet Order   ?  ?         ?    Diet - low sodium heart healthy       ?  ?    Diet Carb Modified Fluid consistency: Thin; Room service appropriate? Yes  Diet effective now       ?  ?  ?   ?  ?  ?   ?  ?  ?Precautions / Restrictions ?Precautions ?Precautions: Fall ?Other Brace: bledsoe brace locked in extension at all times per secure chat with ortho MD ?Restrictions ?Weight Bearing Restrictions: Yes ?RUE Weight Bearing: Non weight bearing ?LUE Weight Bearing: Touch down weight bearing ?LLE Weight Bearing: Touchdown weight bearing ?Other Position/Activity Restrictions: Per ortho MD: "She may use her left arm for activity and ADLs"  ?  ?Has the patient had 2 or more falls or a fall with injury in the past year? Yes ?  ?Prior Activity Level ?Community (5-7x/wk): Went out daily.  Worked FT and was driving. ?  ?Prior Functional Level ?Self Care: Did the patient need help bathing, dressing,  using the toilet or eating? Independent ?  ?Indoor Mobility: Did the patient need assistance with walking from room to room (with or without device)? Independent ?  ?Stairs: Did the patient need assistance with internal or external stairs (with or without device)? Independent ?  ?Functional Cognition: Did the patient need help planning regular tasks such as shopping or remembering to take medications? Independent ?  ?Patient  Information ?Are you of Hispanic, Latino/a,or Spanish origin?: A. No, not of Hispanic, Latino/a, or Spanish origin ?What is your race?: A. White ?Do you need or want an interpreter to communicate with a do

## 2021-12-12 NOTE — Progress Notes (Signed)
IP rehab admissions - I have approval for acute inpatient rehab admission for today from workers comp Sports coach.  Bed available and will admit to CIR today.  Call me for questions.  [phone] ?

## 2021-12-12 NOTE — Progress Notes (Signed)
Inpatient Rehabilitation Admission Medication Review by a Pharmacist ? ?A complete drug regimen review was completed for this patient to identify any potential clinically significant medication issues. ? ?High Risk Drug Classes Is patient taking? Indication by Medication  ?Antipsychotic No   ?Anticoagulant No   ?Antibiotic No   ?Opioid Yes Oxycodone prn  ?Antiplatelet Yes Asa 325mg /d  ?Hypoglycemics/insulin Yes SSI inpatient, PO meds PTA - not cont  ?Vasoactive Medication Yes HTN - ziac  ?Chemotherapy No   ?Other No   ? ? ? ?Type of Medication Issue Identified Description of Issue Recommendation(s)  ?Drug Interaction(s) (clinically significant) ?    ?Duplicate Therapy ?    ?Allergy ?    ?No Medication Administration End Date ?    ?Incorrect Dose ?    ?Additional Drug Therapy Needed ?    ?Significant med changes from prior encounter (inform family/care partners about these prior to discharge).    ?Other ?    ? ? ?Clinically significant medication issues were identified that warrant physician communication and completion of prescribed/recommended actions by midnight of the next day:  No ? ?Name of provider notified for urgent issues identified:  ? ?Provider Method of Notification:  ? ? ? ?Pharmacist comments:  ? ?Time spent performing this drug regimen review (minutes):  20 minutes ? ? ? Terah Robey ?12/12/2021 6:30 PM ?

## 2021-12-12 NOTE — Progress Notes (Signed)
PT Cancellation Note ? ?Patient Details ?Name: Alexandra Parsons ?MRN: [id number] ?DOB: Mar 19, 1961 ? ? ?Cancelled Treatment:    Reason Eval/Treat Not Completed: Patient to d/c to CIR today, will defer treatment. ? ?Ina Homes, PT, DPT ?Acute Rehabilitation Services  ?Pager [phone] ?Office [phone] ? ?Malachy Chamber ?12/12/2021, 1:34 PM ?

## 2021-12-12 NOTE — Progress Notes (Signed)
Patient arrived on unit, oriented to unit. Reviewed medications, therapy schedule, rehab routine and plan of care. States an understanding of information reviewed. No complications noted at this time. Patient reports is AX4 ?Alexandra Parsons   ?

## 2021-12-12 NOTE — Progress Notes (Signed)
? ?  Subjective: ? ?Pain controlled this AM. Continues to work with PT. Has been compliant with WB restrictions. ? ? ?Objective:  ? ?VITALS:   ?Vitals:  ? 12/11/21 0806 12/11/21 1315 12/11/21 2055 12/12/21 0625  ?BP: 139/71 137/64 (!) 126/116 (!) 128/55  ?Pulse: (!) 53 (!) 56 89 60  ?Resp: 18 16 17 16   ?Temp: 98.2 ?F (36.8 ?C) 97.8 ?F (36.6 ?C) 98 ?F (36.7 ?C)   ?TempSrc: Oral Oral Oral   ?SpO2: 98% 96% 90% 96%  ?Weight:      ? ? ? ? ?Lab Results  ?Component Value Date  ? WBC 9.9 12/12/2021  ? HGB 10.3 (L) 12/12/2021  ? HCT 30.8 (L) 12/12/2021  ? MCV 96.9 12/12/2021  ? PLT 183 12/12/2021  ? ? ?Right elbow is in a splint.  SILT all distributions of right hand, +AIN/IO/PIN, fingers WWP  2+ radial pulse. ?  ?Left elbow she has range of motion from 0-125.   She has full pro supination.  2+ radial pulse.  Sensation is intact distally.  Tender about the radial head. ?  ?Left knee with improved effusion.  In hinged knee brace, fitted well. ROM is from 0-90. Able to fire EHL as well as tibialis anterior and gastrocsoleus.  2+ dorsalis pedis pulse. Tenderness about proximal tibia ?  ? ? ?Assessment/Plan: ? ?3 Days Post-Op status post open reduction internal fixation of right olecranon also with a left nondisplaced tibial plateau fracture ? ?- Patient to work with PT/OT to optimize mobilization safely -she will be TDWB on the left lower extremity.  She has a brace at home that we will plan to place on her left leg locked in extension ?- DVT ppx - SCDs, ambulation, aspirin 325 daily ?-Nonweightbearing right upper extremity.  She may use her left arm for activity and ADLs.  TDWB left lower extremity ?- Discharge planning pending CM, appreciate coordination  ? ?Alexandra Parsons ?12/12/2021, 7:36 AM ? ?

## 2021-12-12 NOTE — H&P (Incomplete)
? ? ?Physical Medicine and Rehabilitation Admission H&P ? ?  ?CC: Debility secondary to polytrauma ? ?HPI: Alexandra Parsons is a 61 year old female who had a ground-level fall on 12/07/2021 while at work.  She was carrying items in both hands and fell to both elbows.  She did not lose consciousness or hit her head.  She presented to emergency department at [facility] [place].  Orthopedic consultation was obtained with regards to right olecranon process fracture, left tibial plateau fracture and left radial head fracture.  ED physician spoke with Dr. Erlinda Hong.  She was admitted to the hospitalist service for observation and pain control. ?She underwent open reduction internal fixation of the right olecranon on 5/2 with Dr. Sammuel Hines.  In addition, she underwent left knee aspiration with exam under anesthesia.  She is maintained in right upper extremity splint.  No weightbearing.  She is nonweightbearing of her left upper extremity and may use her left arm for activity and ADLs.  She is touchdown weightbearing of the left lower extremity.  Mild acute blood loss anemia with stable H&H.  Her pain is currently controlled and she is tolerating her diet.  No dysuria.  Had a bowel movement yesterday.The patient requires inpatient physical medicine and rehabilitation evaluations and treatment secondary to dysfunction due to all resulting in multiple fractures. ? ?Patient's past medical history is significant for Beatties mellitus type II.  A1c 6.2.  Her home regimen includes metformin 1000 mg twice daily, Januvia 100 mg daily and Actos 30 mg daily. ? ?She is an active cigarette smoker approximately 15 cigarettes/day.  Nicotine transdermal patch is controlling cravings.  She uses alcohol on a rare basis. ?She is employed full-time at the jail as a Equities trader.  Her daughter Apolonio Schneiders provide support. ? ?PCP: Jos? Larose Kells, [place] ?Review of Systems  ?Constitutional:  Negative for chills and fever.  ?HENT:   Negative for congestion, hearing loss and sore throat.   ?Eyes:  Negative for blurred vision and double vision.  ?     Wears glasses  ?Respiratory:  Negative for cough and sputum production.   ?Cardiovascular:  Negative for chest pain and palpitations.  ?Gastrointestinal:  Negative for constipation, diarrhea, nausea and vomiting.  ?Genitourinary:  Negative for dysuria and urgency.  ?Musculoskeletal:   ?     Right elbow pain; pain with pressure to left palm  ?Skin:  Negative for itching and rash.  ?Neurological:  Negative for dizziness and headaches.  ?Past Medical History:  ?Diagnosis Date  ? Abnormal Pap smear of cervix   ? h/o HPV  ? Chronic pain disorder   ? arthitis and fibromyalgia  ? Chronic sinusitis, chronic cough (?COPD) 07/16/2009  ? Depression   ? Diabetes mellitus   ? type 2  ? Dyslipidemia   ? Elevated LFTs 11/19/2011  ? Hypertension   ? Leukocytosis 11/19/2011  ? Menopausal syndrome   ? Neck pain 05/20/2011  ? 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 Submandibular gland swelling   ? Right  ? ?Past Surgical History:  ?Procedure Laterality Date  ? abdominal plasty remotely    ? FINE NEEDLE ASPIRATION Left 12/09/2021  ? Procedure: LEFT KNEE  NEEDLE ASPIRATION;  Surgeon: Vanetta Mulders, MD;  Location: [place];  Service: Orthopedics;  Laterality: Left;  ? NASAL SEPTUM SURGERY  12/26/2015  ? ORIF ELBOW FRACTURE Right 12/09/2021  ? Procedure: OPEN REDUCTION INTERNAL FIXATION (ORIF) ELBOW/OLECRANON FRACTURE;  Surgeon: Vanetta Mulders, MD;  Location: [place];  Service: Orthopedics;  Laterality: Right;  ? pilondial cyst removal    ? TUBAL LIGATION    ? TURBINATE REDUCTION Bilateral 12/26/2015  ? ?Family History  ?Problem Relation Age of Onset  ? Hypertension Other   ?     GM  ? Colon cancer Other   ?     COLON CA GGM x 2  ? Diabetes Other   ?     aunt  ? CAD Maternal Grandfather 20  ? Stroke Neg Hx   ? Breast cancer Neg Hx   ? ?Social History:  reports that she has been smoking cigarettes. She has a 28.00 pack-year  smoking history. She has never used smokeless tobacco. She reports current alcohol use. She reports that she does not use drugs. ?Allergies:  ?Allergies  ?Allergen Reactions  ? Benadryl [Diphenhydramine] Other (See Comments)  ?  Pt states it makes her blood pressure drop  ? Erythromycin Other (See Comments)  ?  palpitations; ZPACK is ok  ? Fluoxetine Hcl Other (See Comments)  ?  hallucinations  ? Venlafaxine Other (See Comments)  ?  made her feel bad, pt states it causes what feels like electric shocks   ? ?Medications Prior to Admission  ?Medication Sig Dispense Refill  ? aspirin EC 81 MG tablet Take 81 mg by mouth daily.    ? bisoprolol-hydrochlorothiazide (ZIAC) 2.5-6.25 MG tablet Take 1 tablet by mouth daily. 90 tablet 1  ? docusate sodium (COLACE) 250 MG capsule Take 250 mg by mouth daily.    ? escitalopram (LEXAPRO) 20 MG tablet Take 1 tablet (20 mg total) by mouth daily. 90 tablet 1  ? ezetimibe (ZETIA) 10 MG tablet Take 1 tablet (10 mg total) by mouth daily. 90 tablet 1  ? Magnesium Citrate 100 MG CAPS Take 100 mg by mouth daily.    ? metFORMIN (GLUCOPHAGE) 1000 MG tablet Take 1 tablet (1,000 mg total) by mouth 2 (two) times daily with a meal. 180 tablet 1  ? oxyCODONE-acetaminophen (PERCOCET/ROXICET) 5-325 MG tablet Take 1 tablet by mouth every 6 (six) hours as needed. (Patient taking differently: Take 1 tablet by mouth every 6 (six) hours as needed for severe pain.) 100 tablet 0  ? pioglitazone (ACTOS) 30 MG tablet Take 1 tablet (30 mg total) by mouth daily. 90 tablet 1  ? rosuvastatin (CRESTOR) 10 MG tablet Take 1 tablet (10 mg total) by mouth every Monday, Wednesday, and Friday. 12 tablet 5  ? sitaGLIPtin (JANUVIA) 100 MG tablet Take 1 tablet (100 mg total) by mouth daily. 90 tablet 1  ? traZODone (DESYREL) 100 MG tablet Take 1 tablet (100 mg total) by mouth at bedtime as needed for sleep. 90 tablet 0  ? ? ? ? ?Home: ?Home Living ?Family/patient expects to be discharged to:: Private residence ?Living  Arrangements: Alone ?Available Help at Discharge: Family ?Type of Home: House ?Home Access: Stairs to enter ?Entrance Stairs-Number of Steps: 3 ?Entrance Stairs-Rails: None ?Home Layout: One level ?Bathroom Shower/Tub: Tub/shower unit ?Bathroom Toilet: Standard ?Bathroom Accessibility: Yes ?Home Equipment: None ?Additional Comments: may have some DME to borrow, pt unsure. daughter lives nearby ?  ?Functional History: ?Prior Function ?Prior Level of Function : Independent/Modified Independent, Working/employed, Driving ? ?Functional Status:  ?Mobility: ?Bed Mobility ?Overal bed mobility: Needs Assistance ?Bed Mobility: Supine to Sit ?Supine to sit: Supervision, HOB elevated ?General bed mobility comments: cues to maintain RUE NWB as pt attempting to push with R hand, increased time and effort ?Transfers ?Overall  transfer level: Needs assistance ?Equipment used: None, 1 person hand held assist, Crutches ?Transfers: Bed to chair/wheelchair/BSC, Sit to/from Stand ?Sit to Stand: Mod assist ?Bed to/from chair/wheelchair/BSC transfer type:: Stand pivot, Step pivot ?Stand pivot transfers: Min assist ?Step pivot transfers: Mod assist ?General transfer comment: initial stand pivot from bed<>BSC with minA for stability, pt unable to maintain LLE TDWB precautions; additional stand with single crutch for LUE, pt requiring modA for trunk elevation and stability, unable to maintain LLE TDWB precautions with attempt at hopping on RLE with LUE crutch, resulting in assist to help pt reach LUE to recliner in order to pivot to complete transfer; poor safety awareness requiring frequent multimodal cues for safety and external assist for LLE management ?  ?  ? ?ADL: ?ADL ?Overall ADL's : Needs assistance/impaired ?Eating/Feeding: Set up, Sitting ?Grooming: Wash/dry hands, Wash/dry face, Minimal assistance ?Upper Body Bathing: Moderate assistance, Sitting ?Lower Body Bathing: Maximal assistance, Bed level ?Upper Body Dressing : Moderate  assistance, Sitting ?Lower Body Dressing: Maximal assistance, Sitting/lateral leans ? ?Cognition: ?Cognition ?Overall Cognitive Status: Impaired/Different from baseline ?Orientation Level: Oriented X4 ?Cognit

## 2021-12-12 NOTE — H&P (Signed)
? ? ?Physical Medicine and Rehabilitation Admission H&P ? ?  ?CC: Debility secondary to polytrauma ? ?HPI: Alexandra Parsons is a 61 year old female who had a ground-level fall on 12/07/2021 while at work.  She was carrying items in both hands and fell to both elbows.  She did not lose consciousness or hit her head.  She presented to emergency department at [facility] [place].  Orthopedic consultation was obtained with regards to right olecranon process fracture, left tibial plateau fracture and left radial head fracture.  ED physician spoke with Dr. Erlinda Hong.  She was admitted to the hospitalist service for observation and pain control. ?She underwent open reduction internal fixation of the right olecranon on 5/2 with Dr. Sammuel Hines.  In addition, she underwent left knee aspiration with exam under anesthesia.  She is maintained in right upper extremity splint.  No weightbearing.  She is nonweightbearing of her left upper extremity and may use her left arm for activity and ADLs.  She is touchdown weightbearing of the left lower extremity.  Mild acute blood loss anemia with stable H&H.  Her pain is currently controlled and she is tolerating her diet.  No dysuria.  Had a bowel movement yesterday.The patient requires inpatient physical medicine and rehabilitation evaluations and treatment secondary to dysfunction due to all resulting in multiple fractures. ? ?Patient's past medical history is significant for Beatties mellitus type II.  A1c 6.2.  Her home regimen includes metformin 1000 mg twice daily, Januvia 100 mg daily and Actos 30 mg daily. ? ?She is an active cigarette smoker approximately 15 cigarettes/day.  Nicotine transdermal patch is controlling cravings.  She uses alcohol on a rare basis. ?She is employed full-time at the jail as a Equities trader.  Her daughter Apolonio Schneiders provide support. Currently experiencing postoperative pain.  ? ?PCP: Jos? Larose Kells, [place] ?Review of Systems   ?Constitutional:  Negative for chills and fever.  ?HENT:  Negative for congestion, hearing loss and sore throat.   ?Eyes:  Negative for blurred vision and double vision.  ?     Wears glasses  ?Respiratory:  Negative for cough and sputum production.   ?Cardiovascular:  Negative for chest pain and palpitations.  ?Gastrointestinal:  Negative for constipation, diarrhea, nausea and vomiting.  ?Genitourinary:  Negative for dysuria and urgency.  ?Musculoskeletal:   ?     Right elbow pain; pain with pressure to left palm  ?Skin:  Negative for itching and rash.  ?Neurological:  Negative for dizziness and headaches.  ?Past Medical History:  ?Diagnosis Date  ? Abnormal Pap smear of cervix   ? h/o HPV  ? Chronic pain disorder   ? arthitis and fibromyalgia  ? Chronic sinusitis, chronic cough (?COPD) 07/16/2009  ? Depression   ? Diabetes mellitus   ? type 2  ? Dyslipidemia   ? Elevated LFTs 11/19/2011  ? Hypertension   ? Leukocytosis 11/19/2011  ? Menopausal syndrome   ? Neck pain 05/20/2011  ? 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 Submandibular gland swelling   ? Right  ? ?Past Surgical History:  ?Procedure Laterality Date  ? abdominal plasty remotely    ? FINE NEEDLE ASPIRATION Left 12/09/2021  ? Procedure: LEFT KNEE  NEEDLE ASPIRATION;  Surgeon: Vanetta Mulders, MD;  Location: [place];  Service: Orthopedics;  Laterality: Left;  ? NASAL SEPTUM SURGERY  12/26/2015  ? ORIF ELBOW FRACTURE Right 12/09/2021  ? Procedure: OPEN REDUCTION INTERNAL FIXATION (ORIF) ELBOW/OLECRANON FRACTURE;  Surgeon: Vanetta Mulders, MD;  Location: [place];  Service: Orthopedics;  Laterality: Right;  ? pilondial cyst removal    ? TUBAL LIGATION    ? TURBINATE REDUCTION Bilateral 12/26/2015  ? ?Family History  ?Problem Relation Age of Onset  ? Hypertension Other   ?     GM  ? Colon cancer Other   ?     COLON CA GGM x 2  ? Diabetes Other   ?     aunt  ? CAD Maternal Grandfather 23  ? Stroke Neg Hx   ? Breast cancer Neg Hx   ? ?Social History:  reports that she  has been smoking cigarettes. She has a 28.00 pack-year smoking history. She has never used smokeless tobacco. She reports current alcohol use. She reports that she does not use drugs. ?Allergies:  ?Allergies  ?Allergen Reactions  ? Benadryl [Diphenhydramine] Other (See Comments)  ?  Pt states it makes her blood pressure drop  ? Erythromycin Other (See Comments)  ?  palpitations; ZPACK is ok  ? Fluoxetine Hcl Other (See Comments)  ?  hallucinations  ? Venlafaxine Other (See Comments)  ?  made her feel bad, pt states it causes what feels like electric shocks   ? ?Medications Prior to Admission  ?Medication Sig Dispense Refill  ? [START ON 12/13/2021] aspirin 325 MG tablet Take 1 tablet (325 mg total) by mouth daily.    ? bisacodyl (DULCOLAX) 5 MG EC tablet Take 1 tablet (5 mg total) by mouth daily as needed for moderate constipation. 30 tablet 0  ? bisoprolol-hydrochlorothiazide (ZIAC) 2.5-6.25 MG tablet Take 1 tablet by mouth daily. 90 tablet 1  ? docusate sodium (COLACE) 250 MG capsule Take 250 mg by mouth daily.    ? escitalopram (LEXAPRO) 20 MG tablet Take 1 tablet (20 mg total) by mouth daily. 90 tablet 1  ? ezetimibe (ZETIA) 10 MG tablet Take 1 tablet (10 mg total) by mouth daily. 90 tablet 1  ? feeding supplement (ENSURE ENLIVE / ENSURE PLUS) LIQD Take 237 mLs by mouth 2 (two) times daily between meals. 237 mL 12  ? Magnesium Citrate 100 MG CAPS Take 100 mg by mouth daily.    ? metFORMIN (GLUCOPHAGE) 1000 MG tablet Take 1 tablet (1,000 mg total) by mouth 2 (two) times daily with a meal. 180 tablet 1  ? methocarbamol (ROBAXIN) 500 MG tablet Take 1 tablet (500 mg total) by mouth every 6 (six) hours as needed for muscle spasms.    ? [START ON 12/13/2021] nicotine (NICODERM CQ - DOSED IN MG/24 HOURS) 21 mg/24hr patch Place 1 patch (21 mg total) onto the skin daily. 28 patch 0  ? oxyCODONE-acetaminophen (PERCOCET/ROXICET) 5-325 MG tablet Take 1 tablet by mouth every 6 (six) hours as needed. (Patient taking differently:  Take 1 tablet by mouth every 6 (six) hours as needed for severe pain.) 100 tablet 0  ? pioglitazone (ACTOS) 30 MG tablet Take 1 tablet (30 mg total) by mouth daily. 90 tablet 1  ? polyethylene glycol powder (GLYCOLAX/MIRALAX) 17 GM/SCOOP powder Mix 17 grams in 8 oz of liquid and drink by mouth daily as needed for mild constipation. 238 g 0  ? rosuvastatin (CRESTOR) 10 MG tablet Take 1 tablet (10 mg total) by mouth every Monday, Wednesday, and Friday. 12 tablet 5  ? sitaGLIPtin (JANUVIA) 100 MG tablet Take 1 tablet (100 mg total) by mouth daily. 90 tablet 1  ? traZODone (DESYREL) 100 MG tablet Take 1 tablet (100 mg  total) by mouth at bedtime as needed for sleep. 90 tablet 0  ? ? ?Home: ?Home Living ?Family/patient expects to be discharged to:: Private residence ?Living Arrangements: Alone ?Available Help at Discharge: Family ?Type of Home: House ?Home Access: Stairs to enter ?Entrance Stairs-Number of Steps: 3 ?Entrance Stairs-Rails: None ?Home Layout: One level ?Bathroom Shower/Tub: Tub/shower unit ?Bathroom Toilet: Standard ?Bathroom Accessibility: Yes ?Home Equipment: None ?Additional Comments: may have some DME to borrow, pt unsure. daughter lives nearby ?  ?Functional History: ?Prior Function ?Prior Level of Function : Independent/Modified Independent, Working/employed, Driving ?  ?Functional Status:  ?Mobility: ?Bed Mobility ?Overal bed mobility: Needs Assistance ?Bed Mobility: Supine to Sit ?Supine to sit: Supervision, HOB elevated ?General bed mobility comments: cues to maintain RUE NWB as pt attempting to push with R hand, increased time and effort ?Transfers ?Overall transfer level: Needs assistance ?Equipment used: None, 1 person hand held assist, Crutches ?Transfers: Bed to chair/wheelchair/BSC, Sit to/from Stand ?Sit to Stand: Mod assist ?Bed to/from chair/wheelchair/BSC transfer type:: Stand pivot, Step pivot ?Stand pivot transfers: Min assist ?Step pivot transfers: Mod assist ?General transfer comment:  initial stand pivot from bed<>BSC with minA for stability, pt unable to maintain LLE TDWB precautions; additional stand with single crutch for LUE, pt requiring modA for trunk elevation and stability, unable to [employer]

## 2021-12-12 NOTE — Discharge Summary (Signed)
?Physician Discharge Summary ?  ?Patient: Alexandra KinsmanJulie W Parsons MRN: [id number] DOB: 12/05/1960  ?Admit date:     12/07/2021  ?Discharge date: 12/12/21  ?Discharge Physician: Marguerita Merlesmair Mechell Girgis, DO  ? ?PCP: Wanda PlumpPaz, Jose E, MD  ? ?Recommendations at discharge:  ? ?Follow up with PCP within 1-2 weeks and repeat CBC, CMP, Mag, Phos ?Follow up with Orthopedic Surgery within 1-2 weeks and C/w ASA 325 mg Daily and have TDWB LLE and NWB on RUE ? ?Discharge Diagnoses: ?Principal Problem: ?  Fall at home, initial encounter ?Active Problems: ?  DM w/o complication type II (HCC) ?  High cholesterol ?  Anxiety and depression ?  HTN (hypertension) ?  Tobacco abuse ?  Fracture of ulnar shaft, closed ?  Chronic pain disorder ?  Fracture of radial head, left, closed ?  Knee effusion, left ?  Frequent falls ?  DNR (do not resuscitate) ?  Recurrent left knee instability ? ?Resolved Problems: ?  * No resolved hospital problems. * ? ?Hospital Course: ?The patient Alexandra Parsons is a 61 year old female with past medical history significant for type 2 diabetes mellitus, HTN, HLD, chronic pain, anxiety/depression, who presented to [facility] ED on 4/30 with complaint of bilateral arm pain and left knee pain.  She reports she was walking across a flat concrete surface when her shoe caught on the ground causing her to fall forward.  She was carrying objects in her hands at that time and when she fell she strike to both knees and elbows.  Denies loss of consciousness and did not feel lightheaded or dizzy at time of event.  She reports a previous fall roughly 3 weeks ago in which she did not get injured and attributes this to her vision and likely needs to see an eye doctor.  Patient received Dilaudid in the ED with a transient episode of hypoxia afterwards. ?  ?In the ED, temperature 98.5 ?F, HR 72, RR 18, BP 160/77, SPO2 100% on room air.  WBC 10.7, hemoglobin 12.2, platelets 174.  Sodium 137, potassium 4.3, chloride 103, CO2 28, glucose 126, BUN  14, creatinine 0.69.  Magnesium 1.8.  Left elbow x-ray with nondisplaced intra-articular fracture left radial head with associated moderate elbow joint effusion.  Right elbow with severely comminuted, slightly displaced fracture proximal right ulna.  Left knee x-ray with no fracture/dislocation with moderate to large effusion possible hemarthrosis in the suprapatellar bursa.  Orthopedics was consulted.  [facility].  Hospitalist service consulted for admission ? ?**Interim History ?Patient is status post open reduction internal fixation of the right olecranon also with a nondisplaced left tibial plateau fracture.  PT OT to optimize mobilization safety and they are recommending touchdown weightbearing on the left lower extremity as she has a brace at home that they will plan the patient on her left leg locked in extension.  Per Ortho she is nonweightbearing the right upper extremity.  She had an MRI done which showed a nondisplaced tibial plateau fracture.  Pain was controlled on regimen but had worsened once her Nerve Block on her Right Arm wore off. PT/OT now recommending CIR and she is medically stable to D/C. ? ?Assessment and Plan: ? ?Right Comminuted Olecranon Fracture ?Left nondisplaced radial head fracture ?Left knee hemarthrosis with acute nondisplaced tibial plateau fracture ?-Patient presenting to ED following mechanical fall with pain to bilateral upper extremities and left knee. Left elbow x-ray with nondisplaced intra-articular fracture left radial head with associated  moderate elbow joint effusion.   ?-Right elbow with severely comminuted, slightly displaced fracture proximal right ulna.   ?-Left knee x-ray with no fracture/dislocation with moderate to large effusion possible hemarthrosis in the suprapatellar bursa.  ?-Orthopedics following, appreciate assistance ?-C/w Oxycodone-Acetaminophen 5-325 mg 1-2 po q4hprn Moderate Pain ?-C/w  Morphine 2 mg IV q2h PRN severe pain ?-C/w Methocarbamol 500 mg po/IV q6hprn as needed muscle spasms ?-She was N.p.o. for planned ORIF right elbow olecranon fracture and left knee aspiration yesterday ?-MRI Left knee done after aspiration showed "Acute, nondisplaced, fracture extending from the medial tibial spine inferiorly towards the  anterior lateral and the anterior mid-transverse portions of the  proximal  tibial metaphysis. Moderate lipohemarthrosis.  Intrasubstance degeneration within the medial and lateral menisci without definite tear extending through an articular surface. Mucoid  degeneration of the ACL.  No definite tear.  Moderate patellofemoral osteoarthritis." ?-Weightbearing status per orthopedic surgery and they are recommending touchdown weightbearing on the left lower extremity and nonweightbearing right upper extremity and she may use her left arm for activity and ADLs ?-VTE prophylaxis per orthopedic surgery and they have initiated the patient on aspirin 325 mg p.o. daily as well as SCDs and ambulation ?-She is given postoperative antibiotics with Ancef x2 additional doses ?-Continue to monitor for signs and symptoms of bleeding ?-Bowel Reigmen with Miralax 17 grams po Daily and Docusate 100 mg po BID along with Bisacodyl 5 mg po dailyPRN Moderate Constipation ?-PT/OT recommending Acute Inpatient Rehab (3 Hours/Day) with Equipment recommendations for wheelchair with wheelchair cushion and BSC/3in1; She is medically stable to D/C today  ?  ?Leukocytosis, improving  ?-Patient's WBC went from 10.7 -> 22.1 -> 14.4 -> 9.9 ?-Likely reactive in the setting of Surgery and Pain ?-Continue to Monitor for S/Sx of Bleeding; No overt bleeding noted ?-Repeat CBC in the AM  ?  ?ABLA as a Post-Operative Drop ?-Patient's Hgb/Hct went from 12.2/36.4 -> 10.7/30.9 -> 11.0/31.7 -> 10.3/30.8 ?-Checked Anemia Panel and showed an level of 48, UIBC 187, TIBC 235, saturation ratios of 20%, ferritin level 50, hemoglobin  10.2, vitamin B12 226 ?-Continue to Monitor for S/Sx of Bleeding; No overt Bleeding noted ?-Repeat CBC in the AM ?  ?Type 2 Diabetes Mellitus ?-Recent hemoglobin A1c, 6.2, well controlled.   ?-Home regimen includes metformin 1000 mg p.o. twice daily, pioglitazone 30 mg p.o. daily, sitagliptin 100 mg p.o. daily. ?-Continue Holding oral hypoglycemics while inpatient ?-Added Moderate Novolog SSI AC/HS for coverage ?-CBGs qAC/HS and now CBGs ranging from 127-232 ?  ?Essential Hypertension ?-Continue Bisoprolol-HCTZ 2.5-6.25 mg p.o. daily ?-Continue to Monitor BP per Protocol ?-Last BP reading was 129/62 ?  ?Hyperlipidemia ?-C/w Ezetimibe 10mg  PO daily ?-C/w Crestor 10mg  PO daily ?  ?Anxiety/Depression: ?-C/w Escitalopram 20 mg p.o. daily ?  ?Nicotine Use disorder ?-Counseled on need for cessation. ?-C/w Nicotine Patch 21 mg TD ?  ?Hypomagnesemia ?-Patient's Mag Level is now 1.9 ?-Continue to Monitor to and Replete as Necessary ?-Repeat Mag Level in the AM  ?  ?Hypoalbuminemia  ?-Patient's Albumin Level is now 2.9 -> 2.7 ?-Continue to Monitor and Trend ?-Repeat CMP in the AM  ? ?Nutrition Documentation   ? ?Flowsheet Row ED to Hosp-Admission (Current) from 12/07/2021 in [facility]  ?Nutrition Problem Increased nutrient needs  ?Etiology post-op healing  ?Nutrition Goal Patient will meet greater than or equal to 90% of their needs  ?Interventions Ensure Enlive (each supplement provides 350kcal and 20 grams of protein)  ? ?  ? ?  Consultants: Orthopedic Surgery ?Procedures performed: PROCEDURE by Dr. Huel Cote: 1.  Open reduction internal fixation right olecranon ?2.  Closed management of left radial head fracture ?3.  Exam under anesthesia with aspiration of left knee ? ?Disposition: Rehabilitation facility ?Diet recommendation:  ?Cardiac and Carb modified diet ?DISCHARGE MEDICATION: ?Allergies as of 12/12/2021   ? ?   Reactions  ? Benadryl [diphenhydramine] Other (See Comments)  ? Pt  states it makes her blood pressure drop  ? Erythromycin Other (See Comments)  ? palpitations; ZPACK is ok  ? Fluoxetine Hcl Other (See Comments)  ? hallucinations  ? Venlafaxine Other (See Comments)  ? made

## 2021-12-13 DIAGNOSIS — T07XXXA Unspecified multiple injuries, initial encounter: Secondary | ICD-10-CM | POA: Diagnosis not present

## 2021-12-13 DIAGNOSIS — E1169 Type 2 diabetes mellitus with other specified complication: Secondary | ICD-10-CM | POA: Diagnosis not present

## 2021-12-13 DIAGNOSIS — F4323 Adjustment disorder with mixed anxiety and depressed mood: Secondary | ICD-10-CM | POA: Diagnosis not present

## 2021-12-13 DIAGNOSIS — I10 Essential (primary) hypertension: Secondary | ICD-10-CM

## 2021-12-13 DIAGNOSIS — E669 Obesity, unspecified: Secondary | ICD-10-CM

## 2021-12-13 LAB — GLUCOSE, CAPILLARY
Glucose-Capillary: 170 mg/dL — ABNORMAL HIGH (ref 70–99)
Glucose-Capillary: 183 mg/dL — ABNORMAL HIGH (ref 70–99)
Glucose-Capillary: 148 mg/dL — ABNORMAL HIGH (ref 70–99)

## 2021-12-13 MED ORDER — ALPRAZOLAM 0.25 MG PO TABS
0.2500 mg | ORAL_TABLET | Freq: Two times a day (BID) | ORAL | Status: DC | PRN
  Administered 2021-12-13: 0.25 mg via ORAL
  Filled 2021-12-13 (×2): qty 1

## 2021-12-13 MED ORDER — LINAGLIPTIN 5 MG PO TABS
5.0000 mg | ORAL_TABLET | Freq: Every day | ORAL | Status: DC
  Administered 2021-12-15 – 2021-12-26 (×9): 5 mg via ORAL
  Filled 2021-12-13 (×14): qty 1

## 2021-12-13 NOTE — Evaluation (Signed)
Physical Therapy Assessment and Plan ? ?Patient Details  ?Name: Alexandra Parsons ?MRN: [id number] ?Date of Birth: Oct 04, 1960 ? ?PT Diagnosis: Muscle weakness and Pain in joint ?Rehab Potential: Fair ?ELOS: 10 days  ? ?Today's Date: 12/13/2021 ?PT Individual Time: 4315-4008 ?PT Individual Time Calculation (min): 75 min   ? ?Hospital Problem: Principal Problem: ?  Critical polytrauma ? ? ?Past Medical History:  ?Past Medical History:  ?Diagnosis Date  ? Abnormal Pap smear of cervix   ? h/o HPV  ? Chronic pain disorder   ? arthitis and fibromyalgia  ? Chronic sinusitis, chronic cough (?COPD) 07/16/2009  ? Depression   ? Diabetes mellitus   ? type 2  ? Dyslipidemia   ? Elevated LFTs 11/19/2011  ? Hypertension   ? Leukocytosis 11/19/2011  ? Menopausal syndrome   ? Neck pain 05/20/2011  ? 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 Submandibular gland swelling   ? Right  ? ?Past Surgical History:  ?Past Surgical History:  ?Procedure Laterality Date  ? abdominal plasty remotely    ? FINE NEEDLE ASPIRATION Left 12/09/2021  ? Procedure: LEFT KNEE  NEEDLE ASPIRATION;  Surgeon: Huel Cote, MD;  Location: [facility] OR;  Service: Orthopedics;  Laterality: Left;  ? NASAL SEPTUM SURGERY  12/26/2015  ? ORIF ELBOW FRACTURE Right 12/09/2021  ? Procedure: OPEN REDUCTION INTERNAL FIXATION (ORIF) ELBOW/OLECRANON FRACTURE;  Surgeon: Huel Cote, MD;  Location: [facility] OR;  Service: Orthopedics;  Laterality: Right;  ? pilondial cyst removal    ? TUBAL LIGATION    ? TURBINATE REDUCTION Bilateral 12/26/2015  ? ? ?Assessment & Plan ?Clinical Impression: Patient is a 61 y.o. year old female with bilateral elbow pain as well as left knee pain.  She had a fall on the day prior at which point she states that the left foot got caught underneath her and gave out.  She fell forward landing on both arms and left knee.  She subsequently has had difficulty moving bilateral arms and left knee.  She was initially able to place weight on the left knee although there is  subsequently been significant swelling at which point she was no longer able to move or place weight on this.  She was placed in a knee immobilizer on the left knee in the emergency room.  She is placed in a splint for her right elbow.  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 She does have a history of chronic pain for which she is on long-term narcotics for what she states is attributable to arthritis.  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  She is here with her daughter. Admitted to [facility] on 12/07/21.  Underwent ORIF R olecranon fracture on 12/09/21 by Dr. Steward Drone along with closed management of left radial head fracture and aspiration of left knee.  PT/OT evaluations completed with recommendations for inpatient rehab admission. ? ?Patient currently requires mod with mobility secondary to muscle weakness and decreased cardiorespiratoy endurance.  Prior to hospitalization, patient was independent  with mobility and lived with Alone in a House home.  Home access is 4-5 steps in the front; 3 steps in garage (no HR)Stairs to enter. ? ?Patient will benefit from skilled PT intervention to maximize safe functional mobility and minimize fall risk for planned discharge home with 24 hour supervision.  Anticipate patient will benefit from follow up HH at discharge. ? ?PT - End of Session ?Activity Tolerance: Tolerates 30+ min activity with multiple rests ?Endurance  Deficit: Yes ?PT Assessment ?Rehab Potential (ACUTE/IP ONLY): Fair ?PT Barriers to Discharge: Inaccessible home environment;Decreased caregiver support;Home environment access/layout;Lack of/limited family support;Weight bearing restrictions ?PT Patient demonstrates impairments in the following area(s): Balance;Edema;Endurance;Motor;Nutrition;Pain;Perception;Safety;Sensory;Skin Integrity ?PT Transfers Functional Problem(s): Bed Mobility;Bed to Chair;Car;Furniture ?PT Locomotion Functional Problem(s): Wheelchair Mobility ?PT  Plan ?PT Intensity: Minimum of 1-2 x/day ,45 to 90 minutes ?PT Frequency: 5 out of 7 days ?PT Duration Estimated Length of Stay: 10 days ?PT Treatment/Interventions: Discharge planning;Functional mobility training;Psychosocial support;Therapeutic Activities;Balance/vestibular training;Disease management/prevention;Neuromuscular re-education;Skin care/wound management;Therapeutic Exercise;Wheelchair propulsion/positioning;Cognitive remediation/compensation;DME/adaptive equipment instruction;Pain management;Splinting/orthotics;UE/LE Strength taining/ROM;Community reintegration;Functional electrical stimulation;Patient/family education;UE/LE Coordination activities ?PT Transfers Anticipated Outcome(s): spv ?PT Locomotion Anticipated Outcome(s): ModI wc ?PT Recommendation ?Recommendations for Other Services: Therapeutic Recreation consult ?Therapeutic Recreation Interventions: Stress management ?Follow Up Recommendations: 24 hour supervision/assistance;Home health PT ?Patient destination: Home ?Equipment Recommended: To be determined ? ? ?PT Evaluation ?Precautions/Restrictions ?Precautions ?Precautions: Fall ?Required Braces or Orthoses: Other Brace ?Knee Immobilizer - Left: On at all times ?Other Brace: bledsoe brace locked in extension at all times per chart ?Restrictions ?Weight Bearing Restrictions: Yes ?RUE Weight Bearing: Non weight bearing ?LLE Weight Bearing: Touchdown weight bearing ?General ?  Vital Signs  ?Pain ?Pain Assessment ?Pain Scale: 0-10 ?Pain Score: 5  ?Pain Type: Acute pain ?Pain Location: Arm ?Pain Orientation: Right ?Pain Descriptors / Indicators: Aching ?Pain Frequency: Constant ?Pain Onset: On-going ?Patients Stated Pain Goal: 2 ?Pain Intervention(s): Medication (See eMAR) ?Pain Interference ?Pain Interference ?Pain Effect on Sleep: 2. Occasionally ?Pain Interference with Therapy Activities: 2. Occasionally ?Pain Interference with Day-to-Day Activities: 2. Occasionally ?Home Living/Prior  Functioning ?Home Living ?Available Help at Discharge: Family;Available PRN/intermittently ?Type of Home: House ?Home Access: Stairs to enter ?Entrance Stairs-Number of Steps: 4-5 steps in the front; 3 steps in garage (no HR) ?Entrance Stairs-Rails: None ?Home Layout: One level ?Bathroom Shower/Tub: Tub/shower unit ?Bathroom Toilet: Standard ?Bathroom Accessibility: Yes ?Additional Comments: Daughter lives nearby ? Lives With: Alone ?Prior Function ?Level of Independence: Independent with basic ADLs;Independent with homemaking with wheelchair;Independent with transfers;Independent with gait;Independent with homemaking with ambulation ? 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 ?Vision/Perception  ?Vision - History ?Ability to See in Adequate Light: 0 Adequate ?Perception ?Perception: Within Functional Limits ?Praxis ?Praxis: Intact  ?Cognition ?Overall Cognitive Status: Within Functional Limits for tasks assessed ?Arousal/Alertness: Awake/alert ?Orientation Level: Oriented X4 ?Attention: Sustained ?Sustained Attention: Appears intact ?Memory: Appears intact ?Awareness: Appears intact ?Problem Solving: Appears intact ?Problem Solving Impairment: Functional complex ?Safety/Judgment: Appears intact ?Sensation ?Sensation ?Light Touch: Appears Intact ?Hot/Cold: Not tested ?Proprioception: Appears Intact ?Stereognosis: Appears Intact ?Coordination ?Gross Motor Movements are Fluid and Coordinated: Yes ?Fine Motor Movements are Fluid and Coordinated: No ?Heel Shin Test: unable with L LE due to Bledsoe brace ?Motor  ?Motor ?Motor: Other (comment) ?Motor - Skilled Clinical Observations: limited by LLE fx  ? ?Trunk/Postural Assessment  ?Cervical Assessment ?Cervical Assessment: Within Functional Limits ?Thoracic Assessment ?Thoracic Assessment: Within Functional Limits ?Lumbar Assessment ?Lumbar Assessment: Within Functional  Limits ?Postural Control ?Postural Control: Deficits on evaluation  ?Balance ?Balance ?Balance Assessed: Yes ?Static Sitting Balance ?Static Sitting - Level of Assistance: 7: Independent ?Dynamic Sitting Balance ?Dynamic Sittin

## 2021-12-13 NOTE — Progress Notes (Addendum)
?                                                       PROGRESS NOTE ? ? ?Subjective/Complaints: ?Patient ready to get started with therapies.  Right arm a bit more comfortable.  She tells me she unwrapped the splint and rewrapped it herself as it was wrapped too tight previously.  After I left the patient has some shortness of breath and some anxiety.  Vital signs are all stable and symptoms largely subsided. ? ?ROS: Patient denies fever, rash, sore throat, blurred vision, dizziness, nausea, vomiting, diarrhea, cough  ? ? ?Objective: ?  ?No results found. ?Recent Labs  ?  12/11/21 ?1040 12/12/21 ?5329  ?WBC 14.4* 9.9  ?HGB 11.0* 10.3*  ?HCT 31.7* 30.8*  ?PLT 199 183  ? ?Recent Labs  ?  12/11/21 ?1040 12/12/21 ?9242  ?NA 141 138  ?K 4.0 4.7  ?CL 106 105  ?CO2 31 27  ?GLUCOSE 116* 159*  ?BUN 18 20  ?CREATININE 0.78 0.88  ?CALCIUM 8.5* 8.4*  ? ? ?Intake/Output Summary (Last 24 hours) at 12/13/2021 0933 ?Last data filed at 12/13/2021 0720 ?Gross per 24 hour  ?Intake 240 ml  ?Output --  ?Net 240 ml  ?  ? ?  ? ?Physical Exam: ?Vital Signs ?Blood pressure 129/61, pulse 67, temperature 98 ?F (36.7 ?C), temperature source Oral, resp. rate 20, height 5\' 7"  (1.702 m), weight 81.5 kg, SpO2 100 %. ? ?General: Alert and oriented x 3, No apparent distress.  Obese ?HEENT: Head is normocephalic, atraumatic, PERRLA, EOMI, sclera anicteric, oral mucosa pink and moist, dentition intact, ext ear canals clear,  ?Neck: Supple without JVD or lymphadenopathy ?Heart: Reg rate and rhythm. No murmurs rubs or gallops ?Chest: CTA bilaterally without wheezes, rales, or rhonchi; no distress ?Abdomen: Soft, non-tender, non-distended, bowel sounds positive. ?Extremities: No clubbing, cyanosis, 1+ edema left lower extremity and right upper extremity.. Pulses are 2+ ?Psych: Pt's affect is appropriate. Pt is cooperative.  Slightly anxious when I was in the room ?Skin: Clean and intact without signs of breakdown.  A few scattered bruises ?Neuro:  Alert  and oriented x 3. Normal insight and awareness. Intact Memory. Normal language and speech. Cranial nerve exam unremarkable.  No focal motor or sensory abnormalities are appreciated.  She is limited in her right upper extremity movement due to her orthopedic injury and splint ?Musculoskeletal: Right upper extremity splint is wrapped with gutter underneath.  The wrapping is a bit loose but overall appropriate.  She has a hinged knee brace left lower extremity. ? ? ?Assessment/Plan: ?1. Functional deficits which require 3+ hours per day of interdisciplinary therapy in a comprehensive inpatient rehab setting. ?Physiatrist is providing close team supervision and 24 hour management of active medical problems listed below. ?Physiatrist and rehab team continue to assess barriers to discharge/monitor patient progress toward functional and medical goals ? ?Care Tool: ? ?Bathing ?   ?   ? Body parts bathed by helper: Left arm, Chest, Front perineal area, Buttocks, Abdomen, Right upper leg, Right lower leg, Left upper leg, Face ?Body parts n/a: Right arm, Left lower leg ?  ?Bathing assist Assist Level: Minimal Assistance - Patient > 75% ?  ?  ?Upper Body Dressing/Undressing ?Upper body dressing   ?What is the patient wearing?: Hospital gown only ?   ?  Upper body assist Assist Level: Minimal Assistance - Patient > 75% ?   ?Lower Body Dressing/Undressing ?Lower body dressing ? ? ?   ?What is the patient wearing?: Pants ? ?  ? ?Lower body assist Assist for lower body dressing: Maximal Assistance - Patient 25 - 49% ?   ? ?Toileting ?Toileting    ?Toileting assist Assist for toileting: Minimal Assistance - Patient > 75% ?  ?  ?Transfers ?Chair/bed transfer ? ?Transfers assist ?   ? ?Chair/bed transfer assist level: Minimal Assistance - Patient > 75% ?  ?  ?Locomotion ?Ambulation ? ? ?Ambulation assist ? ?   ? ?  ?  ?   ? ?Walk 10 feet activity ? ? ?Assist ?   ? ?  ?   ? ?Walk 50 feet activity ? ? ?Assist   ? ?  ?   ? ? ?Walk 150 feet  activity ? ? ?Assist   ? ?  ?  ?  ? ?Walk 10 feet on uneven surface  ?activity ? ? ?Assist   ? ? ?  ?   ? ?Wheelchair ? ? ? ? ?Assist   ?  ?  ? ?  ?   ? ? ?Wheelchair 50 feet with 2 turns activity ? ? ? ?Assist ? ?  ?  ? ? ?   ? ?Wheelchair 150 feet activity  ? ? ? ?Assist ?   ? ? ?   ? ?Blood pressure 129/61, pulse 67, temperature 98 ?F (36.7 ?C), temperature source Oral, resp. rate 20, height 5\' 7"  (1.702 m), weight 81.5 kg, SpO2 100 %. ? ?Medical Problem List and Plan: ?1. Functional deficits secondary to fall with multiple fractures ?            -patient may shower but incisions must be covered.  ?            -ELOS/Goals: 7-9 days S ?          -Patient is beginning CIR therapies today including PT and OT  ?2.  Antithrombotics: ?-DVT/anticoagulation:  Pharmaceutical: Other (comment) aspirin 325 mg daily ?            -antiplatelet therapy: Aspirin 325 mg daily ?3. Postoperative pain: continue Tylenol, oxycodone as needed ?4. Depression/anxiety: CSW to evaluate and provide support ?            -antipsychotic agents: n/a ?            -continue Lexapro ? -Add low-dose Xanax as needed for anxiety ?5. Neuropsych: This patient is capable of making decisions on her own behalf. ?6. Skin/Wound Care: Routine skin care checks ?            -- Monitor RUE surgical incision once splint is removed; has nylon sutures ? -Splint appears to be wrapped appropriately and intact.  No need for Orthotec to see at this point ?7. Fluids/Electrolytes/Nutrition: Routine Is and Os and follow-up chemistries ?8. Right olecranon fracture s/p ORIF ?            --NWB; splint in place, Nylon sutures ?9: Left tibial plateau fracture: touch down weight-bearing ?            --she is wearing a brace ?10: Left radial head fracture: NWB LUE ?-may use arm for activity/ADLs ?11: DM2: continue SSI AC/HS and CBGs ?            5/6-- CBGs are a bit labile ? -Begin Tradjenta in place of patient's Januvia that  she uses at home ?12: ABLA: improving; follow-up  CBC ?13: Constipation: is chronic; had BM yesterday. Stools softeners not usually effective ?14: Hypoalbuminemia: continue protein supplements ?15: Tobacco use: cessation counseling; continue Nicoderm ?16: Hypertension: continue Ziac ? 5/6 BP controlled at present ?17: Hyperlipidemia: continue Zetia, Crestor ?  ? ?LOS: ?1 days ?A FACE TO FACE EVALUATION WAS PERFORMED ? ?Ranelle OysterZachary T Gerrald Basu ?12/13/2021, 9:33 AM  ? ?  ?

## 2021-12-13 NOTE — Plan of Care (Signed)
?  Problem: RH Balance ?Goal: LTG Patient will maintain dynamic sitting balance (PT) ?Description: LTG:  Patient will maintain dynamic sitting balance with assistance during mobility activities (PT) ?Flowsheets (Taken 12/13/2021 1241) ?LTG: Pt will maintain dynamic sitting balance during mobility activities with:: Independent ?  ?Problem: Sit to Stand ?Goal: LTG:  Patient will perform sit to stand with assistance level (PT) ?Description: LTG:  Patient will perform sit to stand with assistance level (PT) ?Flowsheets (Taken 12/13/2021 1241) ?LTG: PT will perform sit to stand in preparation for functional mobility with assistance level: Supervision/Verbal cueing ?  ?Problem: RH Bed Mobility ?Goal: LTG Patient will perform bed mobility with assist (PT) ?Description: LTG: Patient will perform bed mobility with assistance, with/without cues (PT). ?Flowsheets (Taken 12/13/2021 1241) ?LTG: Pt will perform bed mobility with assistance level of: Independent ?  ?Problem: RH Bed to Chair Transfers ?Goal: LTG Patient will perform bed/chair transfers w/assist (PT) ?Description: LTG: Patient will perform bed to chair transfers with assistance (PT). ?Flowsheets (Taken 12/13/2021 1241) ?LTG: Pt will perform Bed to Chair Transfers with assistance level: Supervision/Verbal cueing ?  ?Problem: RH Wheelchair Mobility ?Goal: LTG Patient will propel w/c in controlled environment (PT) ?Description: LTG: Patient will propel wheelchair in controlled environment, # of feet with assist (PT) ?Flowsheets (Taken 12/13/2021 1241) ?LTG: Pt will propel w/c in controlled environ  assist needed:: Independent with assistive device ?LTG: Propel w/c distance in controlled environment: 150 ?Goal: LTG Patient will propel w/c in home environment (PT) ?Description: LTG: Patient will propel wheelchair in home environment, # of feet with assistance (PT). ?Flowsheets (Taken 12/13/2021 1241) ?LTG: Pt will propel w/c in home environ  assist needed:: Independent with assistive  device ?LTG: Propel w/c distance in home environment: 50 ?  ?

## 2021-12-13 NOTE — Progress Notes (Signed)
Patient reports shortness of breath and chest pain following transfer with therapy. Vitals all WNL. Oxygen at 100% on RA. Respirations at 20. Patient states that it could be related to anxiety as this is her first full day of therapies. MD notified. New order placed for 0.25 xanax BID. Administered per orders with positive effect. Patient states that chest pain has resolved.  ?

## 2021-12-13 NOTE — Progress Notes (Signed)
Physical Therapy Session Note ? ?Patient Details  ?Name: Alexandra Parsons ?MRN: [id number] ?Date of Birth: [birth year] ? ?Today's Date: 12/13/2021 ?PT Individual Time: 2671-2458 ?PT Individual Time Calculation (min): 56 min  ? ?Short Term Goals: ?Week 1:  PT Short Term Goal 1 (Week 1): STG= LTG based on ELOS ? ?Skilled Therapeutic Interventions/Progress Updates:  ?Patient received sitting up in wc, agreeable to PT. She reports pain, did not rate, premedicated. PT providing rest breaks, distractions and repositioning to assist with pain management. PT confirming with MD that patient is NWB to L UE except for when completing ADLs (eating, brushing teeth, etc). PT transporting patient in wc to therapy gym for time management and energy conservation. PT educating patient on slideboard transfer using R LE only to move patient along the board. She required MinA to scoot and assist to hold board. Shedid report increase in L knee pain with this due to "bouncing" on board. Patient with questions regarding injury to L elbow and why she can't bear weight. PT reviewing x-rays with patient and identifying location of fracture. Patient completing slideboard transfer x2 with grossly CGA. She was able to propel the wc using only the R LE backwards x21ft with MinA. Patient requesting to use the bathroom. MaxA stand pivot to toilet in order to help patient maintain weightbearing precautions. RN aware of patients location and need to transfer using slideboard to bed. Pull string within reach.  ? ?Therapy Documentation ?Precautions:  ?Precautions ?Precautions: Fall ?Required Braces or Orthoses: Other Brace ?Knee Immobilizer - Left: On at all times ?Other Brace: bledsoe brace locked in extension at all times per chart ?Restrictions ?Weight Bearing Restrictions: Yes ?RUE Weight Bearing: Non weight bearing ?LUE Weight Bearing: Non weight bearing ?LLE Weight Bearing: Touchdown weight bearing ? ? ? ?Therapy/Group: Individual Therapy ? ?Westley Foots ?Westley Foots, PT, DPT, CBIS ? ?12/13/2021, 2:03 PM  ?

## 2021-12-13 NOTE — Progress Notes (Signed)
Patient declined to take Tradjenta as she states the DM oral medications give her optical migraines.  ?

## 2021-12-13 NOTE — Evaluation (Signed)
Occupational Therapy Assessment and Plan ? ?Patient Details  ?Name: Alexandra Parsons ?MRN: [id number] ?Date of Birth: 1961/01/30 ? ?OT Diagnosis: acute pain and muscle weakness (generalized) ?Rehab Potential: Rehab Potential (ACUTE ONLY): Good ?ELOS: 10-14 days  ? ?Today's Date: 12/13/2021 ?OT Individual Time: 0623-7628 ?OT Individual Time Calculation (min): 75 min    ? ?Hospital Problem: Principal Problem: ?  Critical polytrauma ? ? ?Past Medical History:  ?Past Medical History:  ?Diagnosis Date  ? Abnormal Pap smear of cervix   ? h/o HPV  ? Chronic pain disorder   ? arthitis and fibromyalgia  ? Chronic sinusitis, chronic cough (?COPD) 07/16/2009  ? Depression   ? Diabetes mellitus   ? type 2  ? Dyslipidemia   ? Elevated LFTs 11/19/2011  ? Hypertension   ? Leukocytosis 11/19/2011  ? Menopausal syndrome   ? Neck pain 05/20/2011  ? 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 Submandibular gland swelling   ? Right  ? ?Past Surgical History:  ?Past Surgical History:  ?Procedure Laterality Date  ? abdominal plasty remotely    ? FINE NEEDLE ASPIRATION Left 12/09/2021  ? Procedure: LEFT KNEE  NEEDLE ASPIRATION;  Surgeon: Huel Cote, MD;  Location: [facility];  Service: Orthopedics;  Laterality: Left;  ? NASAL SEPTUM SURGERY  12/26/2015  ? ORIF ELBOW FRACTURE Right 12/09/2021  ? Procedure: OPEN REDUCTION INTERNAL FIXATION (ORIF) ELBOW/OLECRANON FRACTURE;  Surgeon: Huel Cote, MD;  Location: [facility] OR;  Service: Orthopedics;  Laterality: Right;  ? pilondial cyst removal    ? TUBAL LIGATION    ? TURBINATE REDUCTION Bilateral 12/26/2015  ? ? ?Assessment & Plan ?Clinical Impression: Alexandra Parsons is a 61 year old female who had a ground-level fall on 12/07/2021 while at work.  She was carrying items in both hands and fell to both elbows.  She did not lose consciousness or hit her head.  She presented to emergency department at [facility] [place].  Orthopedic consultation was obtained with regards to right olecranon process  fracture, left tibial plateau fracture and left radial head fracture.  ED physician spoke with Dr. Roda Shutters.  She was admitted to the hospitalist service for observation and pain control. ?She underwent open reduction internal fixation of the right olecranon on 5/2 with Dr. Steward Drone.  In addition, she underwent left knee aspiration with exam under anesthesia.  She is maintained in right upper extremity splint.  No weightbearing.  She is nonweightbearing of her left upper extremity and may use her left arm for activity and ADLs.  She is touchdown weightbearing of the left lower extremity.  Mild acute blood loss anemia with stable H&H.  Her pain is currently controlled and she is tolerating her diet.  No dysuria.  Had a bowel movement yesterday.The patient requires inpatient physical medicine and rehabilitation evaluations and treatment secondary to dysfunction due to all resulting in multiple fractures. ?  ?Patient's past medical history is significant for Beatties mellitus type II.  A1c 6.2.  Her home regimen includes metformin 1000 mg twice daily, Januvia 100 mg daily and Actos 30 mg daily. ?  ?She is an active cigarette smoker approximately 15 cigarettes/day.  Nicotine transdermal patch is controlling cravings.  She uses alcohol on a rare basis. ?She is employed full-time at the jail as a Designer, jewellery.  Her daughter Fleet Contras provide support. Currently experiencing postoperative pain.   Patient transferred to CIR on 12/12/2021 .   ? ?Patient currently requires  min- max A  with  basic self-care skills secondary to muscle weakness, decreased cardiorespiratoy endurance, and decreased standing balance, decreased balance strategies, and difficulty maintaining precautions.  Prior to hospitalization, patient could complete BADLs with modified independent . ? ?Patient will benefit from skilled intervention to increase independence with basic self-care skills prior to discharge  home .  Anticipate patient will require intermittent  supervision and  TBD . ? ?OT - End of Session ?Activity Tolerance: Decreased this session ?Endurance Deficit: Yes ?OT Assessment ?Rehab Potential (ACUTE ONLY): Good ?OT Patient demonstrates impairments in the following area(s): Balance;Safety;Cognition;Skin Integrity;Edema;Endurance;Motor;Pain;Perception ?OT Basic ADL's Functional Problem(s): Grooming;Bathing;Dressing;Toileting ?OT Advanced ADL's Functional Problem(s): Simple Meal Preparation ?OT Transfers Functional Problem(s): Toilet;Tub/Shower ?OT Additional Impairment(s): Fuctional Use of Upper Extremity ?OT Plan ?OT Intensity: Minimum of 1-2 x/day, 45 to 90 minutes ?OT Frequency: 5 out of 7 days ?OT Duration/Estimated Length of Stay: 10-14 days ?OT Treatment/Interventions: Balance/vestibular training;Disease mangement/prevention;Self Care/advanced ADL retraining;Therapeutic Exercise;Wheelchair propulsion/positioning;Cognitive remediation/compensation;Pain management;UE/LE Strength taining/ROM;Skin care/wound managment;DME/adaptive equipment instruction;Community reintegration;Functional electrical stimulation;Splinting/orthotics;Patient/family education;UE/LE Coordination activities;Discharge planning;Functional mobility training;Psychosocial support;Therapeutic Activities ?OT Self Feeding Anticipated Outcome(s): n/a ?OT Basic Self-Care Anticipated Outcome(s): Mod I ?OT Toileting Anticipated Outcome(s): Mod I ?OT Bathroom Transfers Anticipated Outcome(s): supervision shower, mod I toilet ?OT Recommendation ?Patient destination: Home ?Follow Up Recommendations: Other (comment) (intermittent supervision) ?Equipment Recommended: To be determined ?Equipment Details: pt has some DME ? ? ?OT Evaluation ?Precautions/Restrictions  ?Precautions ?Precautions: Fall ?Required Braces or Orthoses: Other Brace ?Knee Immobilizer - Left: On at all times ?Other Brace: bledsoe brace locked in extension at all times per chart ?Restrictions ?Weight Bearing Restrictions: Yes ?RUE  Weight Bearing: Non weight bearing ?LLE Weight Bearing: Touchdown weight bearing ?General ?Chart Reviewed: Yes ?Vital Signs ?Therapy Vitals ?Pulse Rate: 67 ?Resp: 20 ?Oxygen Therapy ?SpO2: 100 % ?O2 Device: Room Air ?Pain ?Pain Assessment ?Pain Scale: 0-10 ?Pain Score: 4  ?Pain Type: Acute pain ?Pain Location: Arm ?Pain Orientation: Right ?Pain Descriptors / Indicators: Aching ?Pain Frequency: Constant ?Pain Onset: On-going ?Patients Stated Pain Goal: 2 ?Pain Intervention(s): Medication (See eMAR) ?Home Living/Prior Functioning ?Home Living ?Family/patient expects to be discharged to:: Private residence ?Living Arrangements: Alone ?Available Help at Discharge: Family, Available PRN/intermittently ?Type of Home: House ?Home Access: Stairs to enter ?Entrance Stairs-Number of Steps: 4-5 steps in the front ?Home Layout: One level ?Bathroom Shower/Tub: Tub/shower unit ?Bathroom Toilet: Standard ?Bathroom Accessibility: Yes ?Additional Comments: Daughter lives nearby ? Lives With: Alone ?Vision ?Baseline Vision/History: 1 Wears glasses ?Ability to See in Adequate Light: 0 Adequate ?Patient Visual Report: No change from baseline ?Vision Assessment?: Vision impaired- to be further tested in functional context ?Perception  ?Perception: Within Functional Limits ?Praxis ?Praxis: Intact ?Cognition ?Cognition ?Overall Cognitive Status: Within Functional Limits for tasks assessed ?Arousal/Alertness: Awake/alert ?Orientation Level: Person;Place;Situation ?Person: Oriented ?Place: Oriented ?Situation: Oriented ?Memory: Appears intact ?Attention: Sustained ?Sustained Attention: Appears intact ?Awareness: Appears intact ?Problem Solving: Impaired ?Problem Solving Impairment: Functional complex ?Safety/Judgment: Appears intact ?Brief Interview for Mental Status (BIMS) ?Repetition of Three Words (First Attempt): 3 ?Temporal Orientation: Year: Correct ?Temporal Orientation: Month: Accurate within 5 days ?Temporal Orientation: Day:  Correct ?Recall: "Sock": Yes, no cue required ?Recall: "Blue": Yes, no cue required ?Recall: "Bed": Yes, no cue required ?BIMS Summary Score: 15 ?Sensation ?Sensation ?Light Touch: Appears Intact ?Coordinatio

## 2021-12-14 DIAGNOSIS — T07XXXA Unspecified multiple injuries, initial encounter: Secondary | ICD-10-CM | POA: Diagnosis not present

## 2021-12-14 DIAGNOSIS — F4323 Adjustment disorder with mixed anxiety and depressed mood: Secondary | ICD-10-CM | POA: Diagnosis not present

## 2021-12-14 DIAGNOSIS — I10 Essential (primary) hypertension: Secondary | ICD-10-CM | POA: Diagnosis not present

## 2021-12-14 DIAGNOSIS — E1169 Type 2 diabetes mellitus with other specified complication: Secondary | ICD-10-CM | POA: Diagnosis not present

## 2021-12-14 LAB — GLUCOSE, CAPILLARY
Glucose-Capillary: 130 mg/dL — ABNORMAL HIGH (ref 70–99)
Glucose-Capillary: 190 mg/dL — ABNORMAL HIGH (ref 70–99)
Glucose-Capillary: 203 mg/dL — ABNORMAL HIGH (ref 70–99)

## 2021-12-14 NOTE — Progress Notes (Signed)
?                                                       PROGRESS NOTE ? ? ?Subjective/Complaints: ?Pt content this morning. Pain controlled. Had an anxiety spell yesterday which passed.  ? ?ROS: Patient denies fever, rash, sore throat, blurred vision, dizziness, nausea, vomiting, diarrhea, cough, shortness of breath or chest pain, joint or back/neck pain, headache, or mood change.  ? ? ?Objective: ?  ?No results found. ?Recent Labs  ?  12/11/21 ?1040 12/12/21 ?[id number]  ?WBC 14.4* 9.9  ?HGB 11.0* 10.3*  ?HCT 31.7* 30.8*  ?PLT 199 183  ? ?Recent Labs  ?  12/11/21 ?1040 12/12/21 ?[id number]  ?NA 141 138  ?K 4.0 4.7  ?CL 106 105  ?CO2 31 27  ?GLUCOSE 116* 159*  ?BUN 18 20  ?CREATININE 0.78 0.88  ?CALCIUM 8.5* 8.4*  ? ? ?Intake/Output Summary (Last 24 hours) at 12/14/2021 0839 ?Last data filed at 12/13/2021 2046 ?Gross per 24 hour  ?Intake 520 ml  ?Output --  ?Net 520 ml  ?  ? ?  ? ?Physical Exam: ?Vital Signs ?Blood pressure (!) 106/44, pulse (!) 56, temperature 98.4 ?F (36.9 ?C), temperature source Oral, resp. rate 18, height 5\' 7"  (1.702 m), weight 81.5 kg, SpO2 94 %. ? ?Constitutional: No distress . Vital signs reviewed. ?HEENT: NCAT, EOMI, oral membranes moist ?Neck: supple ?Cardiovascular: RRR without murmur. No JVD    ?Respiratory/Chest: CTA Bilaterally without wheezes or rales. Normal effort    ?GI/Abdomen: BS +, non-tender, non-distended ?Ext: no clubbing, cyanosis, or edema ?Psych: pleasant and cooperative  ?Skin: Clean and intact without signs of breakdown.  A few scattered bruises on UE and LE ?Neuro:  Alert and oriented x 3. Normal insight and awareness. Intact Memory. Normal language and speech. Cranial nerve exam unremarkable.  No focal motor or sensory abnormalities are appreciated.  She is limited in her right upper extremity movement due to her orthopedic injury and splint ?Musculoskeletal: Right upper extremity splint in place. The wrapping is a bit loose but overall appropriate.  She has a hinged knee brace left  lower extremity. ? ? ?Assessment/Plan: ?1. Functional deficits which require 3+ hours per day of interdisciplinary therapy in a comprehensive inpatient rehab setting. ?Physiatrist is providing close team supervision and 24 hour management of active medical problems listed below. ?Physiatrist and rehab team continue to assess barriers to discharge/monitor patient progress toward functional and medical goals ? ?Care Tool: ? ?Bathing ?   ?   ? Body parts bathed by helper: Left arm, Chest, Front perineal area, Buttocks, Abdomen, Right upper leg, Right lower leg, Left upper leg, Face ?Body parts n/a: Right arm, Left lower leg ?  ?Bathing assist Assist Level: Minimal Assistance - Patient > 75% ?  ?  ?Upper Body Dressing/Undressing ?Upper body dressing   ?What is the patient wearing?: Pull over shirt ?   ?Upper body assist Assist Level: Moderate Assistance - Patient 50 - 74% ?   ?Lower Body Dressing/Undressing ?Lower body dressing ? ? ?   ?What is the patient wearing?: Pants ? ?  ? ?Lower body assist Assist for lower body dressing: Maximal Assistance - Patient 25 - 49% ?   ? ?Toileting ?Toileting    ?Toileting assist Assist for toileting: Moderate Assistance - Patient 50 - 74% ?  ?  ?  Transfers ?Chair/bed transfer ? ?Transfers assist ?   ? ?Chair/bed transfer assist level: Moderate Assistance - Patient 50 - 74% ?  ?  ?Locomotion ?Ambulation ? ? ?Ambulation assist ? ? Ambulation activity did not occur: Safety/medical concerns ? ?  ?  ?   ? ?Walk 10 feet activity ? ? ?Assist ? Walk 10 feet activity did not occur: Safety/medical concerns ? ?  ?   ? ?Walk 50 feet activity ? ? ?Assist Walk 50 feet with 2 turns activity did not occur: Safety/medical concerns ? ?  ?   ? ? ?Walk 150 feet activity ? ? ?Assist Walk 150 feet activity did not occur: Safety/medical concerns ? ?  ?  ?  ? ?Walk 10 feet on uneven surface  ?activity ? ? ?Assist Walk 10 feet on uneven surfaces activity did not occur: Safety/medical concerns ? ? ?  ?    ? ?Wheelchair ? ? ? ? ?Assist Is the patient using a wheelchair?: Yes ?Type of Wheelchair: Manual ?  ? ?Wheelchair assist level: Minimal Assistance - Patient > 75% ?   ? ? ?Wheelchair 50 feet with 2 turns activity ? ? ? ?Assist ? ?  ?  ? ? ?Assist Level: Minimal Assistance - Patient > 75%  ? ?Wheelchair 150 feet activity  ? ? ? ?Assist ?   ? ? ?Assist Level: Moderate Assistance - Patient 50 - 74%  ? ?Blood pressure (!) 106/44, pulse (!) 56, temperature 98.4 ?F (36.9 ?C), temperature source Oral, resp. rate 18, height 5\' 7"  (1.702 m), weight 81.5 kg, SpO2 94 %. ? ?Medical Problem List and Plan: ?1. Functional deficits secondary to fall with multiple fractures ?            -patient may shower but incisions must be covered.  ?            -ELOS/Goals: 7-9 days S ?          -Continue CIR therapies including PT, OT  ? 5/7 -Pt is limited by bilateral UE NWB. She indicates that there is not someone at home to help with mobility. I reviewed intra-articular left radial head fx. It is minimal/non-displaced. Recommend reaching out to ortho tomorrow to see if they will allow some wb on LUE, perhaps thru forearm.  ?2.  Antithrombotics: ?-DVT/anticoagulation:  Pharmaceutical: Other (comment) aspirin 325 mg daily ?            -antiplatelet therapy: Aspirin 325 mg daily ?3. Postoperative pain: continue Tylenol, oxycodone as needed ? 5/7 pt reports pain is controlled ?4. Depression/anxiety: CSW to evaluate and provide support ?            -antipsychotic agents: n/a ?            -continue Lexapro ? -continue low-dose Xanax as needed for anxiety ?5. Neuropsych: This patient is capable of making decisions on her own behalf. ?6. Skin/Wound Care: Routine skin care checks ?            -- Monitor RUE surgical incision once splint is removed; has nylon sutures ? -Splint appears to be wrapped appropriately and intact.  ?7. Fluids/Electrolytes/Nutrition: Routine Is and Os and follow-up chemistries ?8. Right olecranon fracture s/p ORIF ?             --NWB; splint in place, Nylon sutures ?9: Left tibial plateau fracture: touch down weight-bearing ?            --she is wearing a brace ?10: Left radial head  fracture: NWB LUE ?-may use arm for activity/ADLs ?-*see #1 ?11: DM2: continue SSI AC/HS and CBGs ? CBG (last 3)  ?Recent Labs  ?  12/13/21 ?1147 12/13/21 ?2051 12/14/21 ?3419  ?GLUCAP 183* 148* 130*  ?             5/7 improvement in cbg's yesterday ? -continueTradjenta in place of patient's Januvia that she uses at home ?12: ABLA: improving; follow-up CBC ?13: Constipation: is chronic; had BM yesterday. Stools softeners not usually effective ?14: Hypoalbuminemia: continue protein supplements ?15: Tobacco use: cessation counseling; continue Nicoderm ?16: Hypertension: continue Ziac ? 5/7 BP controlled at present ?17: Hyperlipidemia: continue Zetia, Crestor ?  ? ?LOS: ?2 days ?A FACE TO FACE EVALUATION WAS PERFORMED ? ?Ranelle Oyster ?12/14/2021, 8:39 AM  ? ?  ?

## 2021-12-15 DIAGNOSIS — D62 Acute posthemorrhagic anemia: Secondary | ICD-10-CM

## 2021-12-15 DIAGNOSIS — E119 Type 2 diabetes mellitus without complications: Secondary | ICD-10-CM | POA: Diagnosis not present

## 2021-12-15 DIAGNOSIS — T07XXXA Unspecified multiple injuries, initial encounter: Secondary | ICD-10-CM | POA: Diagnosis not present

## 2021-12-15 DIAGNOSIS — S82102D Unspecified fracture of upper end of left tibia, subsequent encounter for closed fracture with routine healing: Secondary | ICD-10-CM | POA: Diagnosis not present

## 2021-12-15 LAB — COMPREHENSIVE METABOLIC PANEL
ALT: 16 U/L (ref 0–44)
AST: 22 U/L (ref 15–41)
Albumin: 2.9 g/dL — ABNORMAL LOW (ref 3.5–5.0)
Alkaline Phosphatase: 64 U/L (ref 38–126)
Anion gap: 5 (ref 5–15)
BUN: 15 mg/dL (ref 8–23)
CO2: 28 mmol/L (ref 22–32)
Calcium: 8.6 mg/dL — ABNORMAL LOW (ref 8.9–10.3)
Chloride: 104 mmol/L (ref 98–111)
Creatinine, Ser: 0.91 mg/dL (ref 0.44–1.00)
GFR, Estimated: >60 mL/min (ref >60)
Glucose, Bld: 168 mg/dL — ABNORMAL HIGH (ref 70–99)
Potassium: 3.9 mmol/L (ref 3.5–5.1)
Sodium: 137 mmol/L (ref 135–145)
Total Bilirubin: 0.7 mg/dL (ref 0.3–1.2)
Total Protein: 6.6 g/dL (ref 6.5–8.1)

## 2021-12-15 LAB — CBC WITH DIFFERENTIAL/PLATELET
Abs Immature Granulocytes: 0.03 10*3/uL (ref 0.00–0.07)
Basophils Absolute: 0 10*3/uL (ref 0.0–0.1)
Basophils Relative: 0 %
Eosinophils Absolute: 0.2 10*3/uL (ref 0.0–0.5)
Eosinophils Relative: 2 %
HCT: 32.8 % — ABNORMAL LOW (ref 36.0–46.0)
Hemoglobin: 11.3 g/dL — ABNORMAL LOW (ref 12.0–15.0)
Immature Granulocytes: 0 %
Lymphocytes Relative: 22 %
Lymphs Abs: 2.1 10*3/uL (ref 0.7–4.0)
MCH: 33.5 pg (ref 26.0–34.0)
MCHC: 34.5 g/dL (ref 30.0–36.0)
MCV: 97.3 fL (ref 80.0–100.0)
Monocytes Absolute: 0.7 10*3/uL (ref 0.1–1.0)
Monocytes Relative: 7 %
Neutro Abs: 6.2 10*3/uL (ref 1.7–7.7)
Neutrophils Relative %: 69 %
Platelets: 236 10*3/uL (ref 150–400)
RBC: 3.37 MIL/uL — ABNORMAL LOW (ref 3.87–5.11)
RDW: 14 % (ref 11.5–15.5)
WBC: 9.2 10*3/uL (ref 4.0–10.5)
nRBC: 0 % (ref 0.0–0.2)

## 2021-12-15 LAB — GLUCOSE, CAPILLARY
Glucose-Capillary: 194 mg/dL — ABNORMAL HIGH (ref 70–99)
Glucose-Capillary: 158 mg/dL — ABNORMAL HIGH (ref 70–99)
Glucose-Capillary: 160 mg/dL — ABNORMAL HIGH (ref 70–99)
Glucose-Capillary: 167 mg/dL — ABNORMAL HIGH (ref 70–99)
Glucose-Capillary: 163 mg/dL — ABNORMAL HIGH (ref 70–99)

## 2021-12-15 NOTE — Progress Notes (Signed)
Inpatient Rehabilitation Care Coordinator ?Assessment and Plan ?Patient Details  ?Name: Alexandra Parsons ?MRN: [id number] ?Date of Birth: 1961-04-18 ? ?Today's Date: 12/15/2021 ? ?Hospital Problems: Principal Problem: ?  Critical polytrauma ? ?Past Medical History:  ?Past Medical History:  ?Diagnosis Date  ? Abnormal Pap smear of cervix   ? h/o HPV  ? Chronic pain disorder   ? arthitis and fibromyalgia  ? Chronic sinusitis, chronic cough (?COPD) 07/16/2009  ? Depression   ? Diabetes mellitus   ? type 2  ? Dyslipidemia   ? Elevated LFTs 11/19/2011  ? Hypertension   ? Leukocytosis 11/19/2011  ? Menopausal syndrome   ? Neck pain 05/20/2011  ? 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 Submandibular gland swelling   ? Right  ? ?Past Surgical History:  ?Past Surgical History:  ?Procedure Laterality Date  ? abdominal plasty remotely    ? FINE NEEDLE ASPIRATION Left 12/09/2021  ? Procedure: LEFT KNEE  NEEDLE ASPIRATION;  Surgeon: Vanetta Mulders, MD;  Location: [place];  Service: Orthopedics;  Laterality: Left;  ? NASAL SEPTUM SURGERY  12/26/2015  ? ORIF ELBOW FRACTURE Right 12/09/2021  ? Procedure: OPEN REDUCTION INTERNAL FIXATION (ORIF) ELBOW/OLECRANON FRACTURE;  Surgeon: Vanetta Mulders, MD;  Location: [facility];  Service: Orthopedics;  Laterality: Right;  ? pilondial cyst removal    ? TUBAL LIGATION    ? TURBINATE REDUCTION Bilateral 12/26/2015  ? ?Social History:  reports that she has been smoking cigarettes. She has a 28.00 pack-year smoking history. She has never used smokeless tobacco. She reports current alcohol use. She reports that she does not use drugs. ? ?Family / Support Systems ?Marital Status: Widow/Widower ?Patient Roles: Parent, Other (Comment) (employee) ?Children: Rachel-daughter [phone] ?Other Supports: Sister in [place] ?Anticipated Caregiver: Self ?Ability/Limitations of Caregiver: Daughter is married with a child and can only intermittently check on pt. Pt will be alone at DC ?Caregiver Availability:  Intermittent ?Family Dynamics: Close with daughter and extended family, but all live in other towns and not sure if willing to come and assist her at discharge. Pt has not asked-pt plans to be alone at DC ? ?Social History ?Preferred language: English ?Religion: Seventh Day Adventist ?Cultural Background: No issues ?Education: BSN ?Health Literacy - How often do you need to have someone help you when you read instructions, pamphlets, or other written material from your doctor or pharmacy?: Rarely ?Writes: Yes ?Employment Status: Employed ?Name of Employer: RN at the [employer] ?Return to Work Plans: Hopes to return back to work once healed ?Legal History/Current Legal Issues: Workers Comp case fell at work ?Guardian/Conservator: None-according to MD pt is capable of making her own decisions while here  ? ?Abuse/Neglect ?Abuse/Neglect Assessment Can Be Completed: Yes ?Physical Abuse: Denies ?Verbal Abuse: Denies ?Sexual Abuse: Denies ?Exploitation of patient/patient's resources: Denies ?Self-Neglect: Denies ? ?Patient response to: ?Social Isolation - How often do you feel lonely or isolated from those around you?: Never ? ?Emotional Status ?Pt's affect, behavior and adjustment status: Pt is trying her best she is limited due to her WB issues on arm and legs. PT contacted ortho and he will see Friday to decide on WB for leg. Pt is motivated to try and do her best but has limitations due to multiple fractures on all apendages ?Recent Psychosocial Issues: other health issues-chronic pain ?Psychiatric History: history of depression/anxiety takes medicatons for this and finds them helpful May benefit from seeing neuro-psych for coping while here ?Substance Abuse History: Tobacco aware of the health  issues and has not decided about quitting. Aware of resources available to her ? 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 She does talk with the MD daily and feels she has a good understanding of her treatment plan moving forward. ?Premorbid pt/family roles/activities: mom, grandmother, sibling, employee, home owner, etc ?Anticipated changes in roles/activities/participation: resume ?Pt/family expectations/goals: Pt states: " I hope to be able to take care of myself when I go home I will be alone.' ? ?Community Resources ?Community Agencies: None ?Premorbid Home Care/DME Agencies: None ?Transportation available at discharge: Self daughter can assist with prior notice ?Is the patient able to respond to transportation needs?: Yes ?In the past 12 months, has lack of transportation kept you from medical appointments or from getting medications?: No ?In the past 12 months, has lack of transportation kept you from meetings, work, or from getting things needed for daily living?: No ?Resource referrals recommended: Neuropsychology ? ?Discharge Planning ?Living Arrangements: Alone ?Support Systems: Children, Friends/neighbors, Other relatives ?Type of Residence: Private residence ?Insurance Resources: Multimedia programmer (specify) (Workers Comp) ?Financial Resources: Employment ?Financial Screen Referred: No ?Money Management: Patient ?Does the patient have any problems obtaining your medications?: No ?Home Management: Self will need assist now ?Patient/Family Preliminary Plans: Return home with her daughter checking on her, but no one will be staying with her at discharge. Will see if Ortho allows her to [facility] and will decide on Friday and go from there. She may need assistance if limited and not mod/i by discharge. ?Care Coordinator Barriers to Discharge: Weight bearing restrictions, Lack of/limited family support ?Care Coordinator Anticipated Follow Up Needs: HH/OP ? ?Clinical Impression ?Pleasant female who is working hard in therapies to improve, she is aware of her WB issues and hopeful the Ortho will increase this. 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 She has limited supports due to all family live out of town and can not stay with her at discharge. ? ?Elease Hashimoto ?12/15/2021, 12:34 PM ? ?  ?

## 2021-12-15 NOTE — Progress Notes (Signed)
Orthopedic Tech Progress Note ?Patient Details:  ?Alexandra Parsons ?1960/12/13 ?[id number] ? ?Called in order to [employer] for a BLEDSOE BRACE ? ?Patient ID: Alexandra Parsons, female   DOB: Aug 16, 1960, 61 y.o.   MRN: [id number] ? ?[name] ?12/15/2021, 11:29 AM ? ?

## 2021-12-15 NOTE — Plan of Care (Signed)
?  Problem: Sit to Stand ?Goal: LTG:  Patient will perform sit to stand with assistance level (PT) ?Description: LTG:  Patient will perform sit to stand with assistance level (PT) ?Flowsheets (Taken 12/15/2021 1535) ?LTG: PT will perform sit to stand in preparation for functional mobility with assistance level: Independent with assistive device ?Note: Goal added due to WB status changes.  ?  ?Problem: RH Bed to Chair Transfers ?Goal: LTG Patient will perform bed/chair transfers w/assist (PT) ?Description: LTG: Patient will perform bed to chair transfers with assistance (PT). ?Flowsheets (Taken 12/15/2021 1535) ?LTG: Pt will perform Bed to Chair Transfers with assistance level: Independent with assistive device  ?Note: Upgraded due to change in WB status ?  ?Problem: RH Balance ?Goal: LTG Patient will maintain dynamic standing balance (PT) ?Description: LTG:  Patient will maintain dynamic standing balance with assistance during mobility activities (PT) ?Flowsheets (Taken 12/15/2021 1535) ?LTG: Pt will maintain dynamic standing balance during mobility activities with:: Supervision/Verbal cueing ?Note: Goal added due to WB status changes.  ?  ?Problem: RH Car Transfers ?Goal: LTG Patient will perform car transfers with assist (PT) ?Description: LTG: Patient will perform car transfers with assistance (PT). ?Flowsheets (Taken 12/15/2021 1535) ?LTG: Pt will perform car transfers with assist:: Minimal Assistance - Patient > 75% ?Note: Goal added due to WB status changes.  ?  ?Problem: RH Ambulation ?Goal: LTG Patient will ambulate in controlled environment (PT) ?Description: LTG: Patient will ambulate in a controlled environment, # of feet with assistance (PT). ?Flowsheets (Taken 12/15/2021 1535) ?LTG: Pt will ambulate in controlled environ  assist needed:: Supervision/Verbal cueing ?LTG: Ambulation distance in controlled environment: 15' ?Note: Goal added due to WB status changes.  ?Goal: LTG Patient will ambulate in home  environment (PT) ?Description: LTG: Patient will ambulate in home environment, # of feet with assistance (PT). ?Flowsheets (Taken 12/15/2021 1535) ?LTG: Pt will ambulate in home environ  assist needed:: Supervision/Verbal cueing ?LTG: Ambulation distance in home environment: 15' ?Note: Goal added due to WB status changes.  ?   ?

## 2021-12-15 NOTE — Discharge Summary (Signed)
Physician Discharge Summary  Patient ID: Alexandra Parsons MRN: [id number] DOB/AGE: May 28, 1961 61 y.o.  Admit date: 12/12/2021 Discharge date: 12/26/2021  Discharge Diagnoses:  Principal Problem:   Critical polytrauma Active problems: Debility secondary to polytrauma Right olecranon process fracture status post ORIF Left nondisplaced tibial plateau fracture Left radial head fracture Diabetes mellitus type 2 Tobacco use Depression Acute blood loss anemia Constipation Hypoalbuminemia Hypertension Hyperlipidemia Anxiety   Discharged Condition: good  Significant Diagnostic Studies: DG Elbow 2 Views Right  Result Date: 12/19/2021 CLINICAL DATA:  Status post surgical internal fixation of olecranon fracture. EXAM: RIGHT ELBOW - 2 VIEW COMPARISON:  Dec 09, 2021. FINDINGS: Status post surgical internal fixation of olecranon fracture. Good alignment of fracture components is noted. No new fracture or dislocation is noted. IMPRESSION: Postsurgical changes as described above. Electronically Signed   By: Marijo Conception M.D.   On: 12/19/2021 14:08   DG ELBOW COMPLETE RIGHT (3+VIEW)  Result Date: 12/09/2021 CLINICAL DATA:  Intraoperative fluoroscopy. Open reduction internal fixation olecranon fracture. EXAM: RIGHT ELBOW - COMPLETE 3+ VIEW COMPARISON:  Right elbow radiographs 12/07/2021 FINDINGS: Images were performed intraoperatively without the presence of a radiologist. The patient is undergoing posterior plate and screw fixation of the previously seen proximal ulnar fracture. Total fluoroscopy images: 5 Total fluoroscopy time: 160 seconds Total dose: Radiation Exposure Index (as provided by the fluoroscopic device): 2.82 mGy air Kerma Please see intraoperative findings for further detail. IMPRESSION: Intraoperative fluoro for olecranon ORIF. Electronically Signed   By: Yvonne Kendall M.D.   On: 12/09/2021 17:03   MR KNEE LEFT WO CONTRAST  Result Date: 12/10/2021 CLINICAL DATA:  Knee replacement.  Instability suspected. Fall yesterday onto both arms and left knee. EXAM: MRI OF THE LEFT KNEE WITHOUT CONTRAST TECHNIQUE: Multiplanar, multisequence MR imaging of the knee was performed. No intravenous contrast was administered. COMPARISON:  Left knee radiographs 12/07/2021, left femur radiographs 07/17/2020 FINDINGS: MENISCI Medial meniscus: There is intermediate proton density signal intrasubstance degeneration within the junction of the body and posterior horn of the medial meniscus. No tear is seen extending through the articular surface of the medial meniscus. Lateral meniscus: Intermediate signal intrasubstance degeneration within the anterior greater than posterior horns of the lateral meniscus. This may mildly contact the superior articular surface of the anterior horn of the lateral meniscus (sagittal series 19 image 28), however otherwise no discrete linear meniscal tear is seen. LIGAMENTS Cruciates: There is intermediate T2 signal and moderate thickening of the ACL diffusely likely mucoid degeneration. No definite tear is seen. The PCL is intact. Collaterals: A minimal intermediate T2 signal sprain of the proximal anterior aspect of the medial collateral ligament (axial series 15, image 9). The fibular collateral ligament, biceps femoris tendon, iliotibial band, and popliteus tendon are intact. CARTILAGE Patellofemoral: Full-thickness cartilage loss of the junction of the patellar apex and medial patellar facet at mid height. Thin fissure within the lateral patellar facet cartilage with moderate to high-grade underlying subchondral marrow edema and cystic change (axial image 20). Diffuse high-grade partial to full-thickness cartilage loss throughout the majority of the medial trochlea. Medial: Mild surface irregularity of the weight-bearing medial femoral condyle cartilage. Lateral:  Intact. Joint: Moderate joint effusion with layering intermediate T2 signal hematocrit level and nondependent fat level  (lipohemarthrosis). Hoffa's fat pad is unremarkable. Popliteal Fossa:  Trace fluid within a Baker's cyst. Extensor Mechanism:  Intact quadriceps tendon and patellar tendon. Bones: There is curvilinear decreased T1 increased T2 signal and predominantly vertical orientation extending inferiorly from the  medial tibial spine anteriorly and laterally towards the proximal lateral tibial metaphyseal cortex (coronal series 16) and in 2 regions anteriorly towards the more midline proximal tibial metaphysis (coronal series 16 images 17 through 19). Other: None. IMPRESSION:: IMPRESSION: 1. Acute, nondisplaced, fracture extending from the medial tibial spine inferiorly towards the anterior lateral and the anterior mid-transverse portions of the proximal tibial metaphysis. Moderate lipohemarthrosis. 2. Intrasubstance degeneration within the medial and lateral menisci without definite tear extending through an articular surface. 3. Mucoid degeneration of the ACL.  No definite tear. 4. Moderate patellofemoral osteoarthritis. Electronically Signed   By: Yvonne Kendall M.D.   On: 12/10/2021 08:19   DG Knee Left Port  Result Date: 12/23/2021 CLINICAL DATA:  Pain. EXAM: PORTABLE LEFT KNEE - 1-2 VIEW COMPARISON:  Left knee x-ray 12/07/2021. FINDINGS: No evidence of fracture or dislocation. No evidence of arthropathy or other focal bone abnormality. Suprapatellar soft tissue swelling and joint effusion are present, but have decreased. IMPRESSION: 1. Suprapatellar soft tissue swelling and joint effusion have decreased, but have not completely resolved. 2. No acute fracture or dislocation identified. Electronically Signed   By: Ronney Asters M.D.   On: 12/23/2021 15:12       Labs:  Basic Metabolic Panel:    Latest Ref Rng & Units 12/22/2021    7:02 AM 12/15/2021   12:00 PM 12/12/2021    3:17 AM  CBC  WBC 4.0 - 10.5 K/uL 7.1   9.2   9.9    Hemoglobin 12.0 - 15.0 g/dL 10.6   11.3   10.3    Hematocrit 36.0 - 46.0 % 32.1   32.8    30.8    Platelets 150 - 400 K/uL 242   236   183         Latest Ref Rng & Units 12/22/2021    7:02 AM 12/15/2021   12:00 PM 12/12/2021    3:17 AM  BMP  Glucose 70 - 99 mg/dL 116   168   159    BUN 8 - 23 mg/dL '14   15   20    ' Creatinine 0.44 - 1.00 mg/dL 0.92   0.91   0.88    Sodium 135 - 145 mmol/L 136   137   138    Potassium 3.5 - 5.1 mmol/L 4.0   3.9   4.7    Chloride 98 - 111 mmol/L 104   104   105    CO2 22 - 32 mmol/L '27   28   27    ' Calcium 8.9 - 10.3 mg/dL 8.7   8.6   8.4      CBG (last 3)  Recent Labs    12/25/21 2053 12/26/21 0547 12/26/21 1152  GLUCAP 126* 109* 123*      Brief HPI:   Alexandra Parsons is a 61 y.o. female had a ground-level fall on 12/07/2021 while at work.  She was carrying items in both hands and fell to both elbows.  She was brought to the emergency department where imaging revealed right olecranon process fracture, left tibial plateau fracture and left radial head fracture.  She underwent operative repair of her right upper extremity on 5/2.   Hospital Course: Alexandra Parsons was admitted to rehab 12/12/2021 for inpatient therapies to consist of PT, ST and OT at least three hours five days a week. Past admission physiatrist, therapy team and rehab RN have worked together to provide customized collaborative inpatient rehab.  The patient  exhibited some degree of anxiety secondary to polytrauma and low-dose Xanax was provided as needed.  Dr. Sammuel Hines followed up on 5/8.  Exam revealed improvement in left knee effusion.  Allowed weight-bearing through left forearm. She remained in hinged knee brace with TDWB status.   Blood pressures were monitored on TID basis and remained stable on Ziac.  Diabetes has been monitored with ac/hs CBG checks and SSI was use prn for tighter BS control. CBGs appeared a bit labile.  Tradjenta in place of the patient's Januvia was started on 5/6. Advised to monitor CBGs at discharge and resume metformin 500 mg BID if glucose over 120.  If remain elevated over 24 hours, increase metformin to 1000 mg BID.  Rehab course: During patient's stay in rehab weekly team conferences were held to monitor patient's progress, set goals and discuss barriers to discharge. At admission, patient required min a to scoot and assist to hold slideboard. Transfer x 2 with grossly CGA.  Max assist to stand pivot to toilet. Squat pivot with min A.  She  has had improvement in activity tolerance, balance, postural control as well as ability to compensate for deficits. She has had improvement in functional use RUE/LUE  and RLE/LLE as well as improvement in awareness  Disposition: Home Discharge disposition: 01-Home or Self Care      Diet: Carb modifed  Special Instructions:  No driving, alcohol consumption or tobacco use.   30-35 minutes were spent on discharge planning and discharge summary.   Discharge Instructions     Ambulatory referral to Physical Medicine Rehab   Complete by: As directed    TC follow up with Dr. Marciano Sequin      Allergies as of 12/26/2021       Reactions   Benadryl [diphenhydramine] Other (See Comments)   Pt states it makes her blood pressure drop   Erythromycin Other (See Comments)   palpitations; ZPACK is ok   Fluoxetine Hcl Other (See Comments)   hallucinations   Venlafaxine Other (See Comments)   made her feel bad, pt states it causes what feels like electric shocks         Medication List     STOP taking these medications    bisacodyl 5 MG EC tablet Commonly known as: DULCOLAX   bisoprolol-hydrochlorothiazide 2.5-6.25 MG tablet Commonly known as: ZIAC   feeding supplement Liqd   Magnesium Citrate 100 MG Caps   metFORMIN 1000 MG tablet Commonly known as: GLUCOPHAGE   oxyCODONE-acetaminophen 5-325 MG tablet Commonly known as: PERCOCET/ROXICET   pioglitazone 30 MG tablet Commonly known as: ACTOS       TAKE these medications    aspirin 325 MG tablet Take 1 tablet (325 mg total) by  mouth daily.   blood glucose meter kit and supplies Kit Use up to 4 times daily as directed   docusate sodium 100 MG capsule Commonly known as: COLACE Take 1 capsule (100 mg total) by mouth 2 (two) times daily. What changed:  medication strength how much to take when to take this   escitalopram 20 MG tablet Commonly known as: LEXAPRO Take 1 tablet (20 mg total) by mouth daily.   ezetimibe 10 MG tablet Commonly known as: ZETIA Take 1 tablet (10 mg total) by mouth daily.   glucose blood test strip Use as directed up to 4 times daily   Lancets Misc Use as directed 4 times daily   lidocaine 5 % Commonly known as: LIDODERM Apply 1 patch on to the skin  of the left chest wall at 8am and remove at 8pm daily. Patch has to be off for 12 hours.   lidocaine 4 % cream Commonly known as: LMX Apply a thin layer on to the left thumb/left wrist 3 times daily as needed for pain   methocarbamol 500 MG tablet Commonly known as: ROBAXIN Take 1 tablet (500 mg total) by mouth every 6 (six) hours as needed for muscle spasms.   nicotine 21 mg/24hr patch Commonly known as: NICODERM CQ - dosed in mg/24 hours Place 1 patch (21 mg total) onto the skin daily.   Oxycodone HCl 10 MG Tabs Take 1 - 1 & 1/2 tablets (10-15 mg total) by mouth every 6 (six) hours as needed for severe pain. Notes to patient: Wean down to 10 mg four times a day as needed over next 1-2 days. Discuss further taper with Dr. Larose Kells.   polyethylene glycol powder 17 GM/SCOOP powder Commonly known as: GLYCOLAX/MIRALAX Mix 17 grams in 8 oz of liquid and drink by mouth daily as needed for mild constipation.   rosuvastatin 10 MG tablet Commonly known as: Crestor Take 1 tablet (10 mg total) by mouth every Monday, Wednesday, and Friday.   sitaGLIPtin 100 MG tablet Commonly known as: Januvia Take 1 tablet (100 mg total) by mouth daily.   traZODone 50 MG tablet Commonly known as: DESYREL Take 1 tablet (50 mg total) by mouth at  bedtime as needed for sleep. What changed:  medication strength how much to take               Durable Medical Equipment  (From admission, onward)           Start     Ordered   12/15/21 0838  For home use only DME Walker platform  Once       Question:  Patient needs a walker to treat with the following condition  Answer:  Postop check   12/15/21 0837            Follow-up Ritchie, MD Follow up.   Specialty: Internal Medicine Why: call for follow up appointment (post hospital follow up) Contact information: [address] [place] [address] [phone]         Vanetta Mulders, MD Follow up.   Specialty: Orthopedic Surgery Why: call for follow up appointment Contact information: [place] [address] [phone]         Jennye Boroughs, MD Follow up.   Specialty: Physical Medicine and Rehabilitation Why: office will call you with follow up appointment Contact information: [address] [place] [address] [phone]                 Signed: Barbie Banner 12/26/2021, 2:42 PM

## 2021-12-15 NOTE — Progress Notes (Signed)
Inpatient Rehabilitation  Patient information reviewed and entered into eRehab system by Jia Dottavio M. Aleta Manternach, M.A., CCC/SLP, PPS Coordinator.  Information including medical coding, functional ability and quality indicators will be reviewed and updated through discharge.    

## 2021-12-15 NOTE — Progress Notes (Signed)
? ?  Subjective: ? ?Patient transferred to inpatient rehab over weekend. Overall doing well with no issues. ? ?Objective:  ? ?VITALS:   ?Vitals:  ? 12/14/21 1547 12/14/21 1658 12/14/21 1918 12/15/21 0355  ?BP: (!) 103/58 (!) 144/58 103/70 (!) 102/51  ?Pulse: (!) 58 70 (!) 57 61  ?Resp: 16 17 18 18   ?Temp: 98.8 ?F (37.1 ?C) 98.1 ?F (36.7 ?C) 97.6 ?F (36.4 ?C) 99.1 ?F (37.3 ?C)  ?TempSrc:  Oral Oral Oral  ?SpO2: 96% 99% 99% 93%  ?Weight:      ?Height:      ? ? ? ? ?Lab Results  ?Component Value Date  ? WBC 9.9 12/12/2021  ? HGB 10.3 (L) 12/12/2021  ? HCT 30.8 (L) 12/12/2021  ? MCV 96.9 12/12/2021  ? PLT 183 12/12/2021  ? ? ?Right elbow is in a splint.  SILT all distributions of right hand, +AIN/IO/PIN, fingers WWP  2+ radial pulse. ?  ?Left elbow she has range of motion from 0-125.   She has full pro supination.  2+ radial pulse.  Sensation is intact distally.  Tender about the radial head. ?  ?Left knee with improved effusion.  In hinged knee brace, fitted well. ROM is from 0-90. Able to fire EHL as well as tibialis anterior and gastrocsoleus.  2+ dorsalis pedis pulse. Tenderness about proximal tibia ?  ? ? ?Assessment/Plan: ? ?Status post open reduction internal fixation of right olecranon also with a left nondisplaced tibial plateau fracture 12/09/21 ? ?- Patient to work with PT/OT to optimize mobilization safely -she will be TDWB on the left lower extremity.  She has a brace at home that we will plan to place on her left leg locked in extension ?- DVT ppx - SCDs, ambulation, aspirin 325 daily ?-Nonweightbearing right upper extremity.  She may use her left arm for activity and ADLs.  TDWB left lower extremity ?- This upcoming Friday 5/12, will plan to advance patient to WBAT left leg and remove splint right arm. ? ?02-22-1993 ?12/15/2021, 7:57 AM ? ?

## 2021-12-15 NOTE — Progress Notes (Signed)
Patient ID: Alexandra Parsons, female   DOB: July 12, 1961, 61 y.o.   MRN: [id number] ?Met with the patient to review rehab process, team conference, and plan of care. Reviewed secondary risk management and WB limitations with modifications received from ortho MD. Reviewed pain management along with skin care. Discussed medications for DM and HTN and HLD (LDL 83/Trig 164). Reviewed food options for pain control, increased calcium and protein. Continue to follow along to discharge to address educational needs to facilitate preparation for discharge home. Dorien Chihuahua B ? ?

## 2021-12-15 NOTE — Progress Notes (Incomplete)
?                                                       PROGRESS NOTE ? ? ?Subjective/Complaints: ?Patient ready to get started with therapies.  Right arm a bit more comfortable.  She tells me she unwrapped the splint and rewrapped it herself as it was wrapped too tight previously.  After I left the patient has some shortness of breath and some anxiety.  Vital signs are all stable and symptoms largely subsided. ? ?ROS: Patient denies fever, rash, sore throat, blurred vision, dizziness, nausea, vomiting, diarrhea, cough  ? ? ?Objective: ?  ?No results found. ?No results for input(s): WBC, HGB, HCT, PLT in the last 72 hours. ? ?No results for input(s): NA, K, CL, CO2, GLUCOSE, BUN, CREATININE, CALCIUM in the last 72 hours. ? ? ?Intake/Output Summary (Last 24 hours) at 12/15/2021 0731 ?Last data filed at 12/14/2021 2100 ?Gross per 24 hour  ?Intake 238 ml  ?Output --  ?Net 238 ml  ? ?  ? ?  ? ?Physical Exam: ?Vital Signs ?Blood pressure (!) 102/51, pulse 61, temperature 99.1 ?F (37.3 ?C), temperature source Oral, resp. rate 18, height 5\' 7"  (1.702 m), weight 81.5 kg, SpO2 93 %. ? ?General: Alert and oriented x 3, No apparent distress.  Obese ?HEENT: Head is normocephalic, atraumatic, PERRLA, EOMI, sclera anicteric, oral mucosa pink and moist, dentition intact, ext ear canals clear,  ?Neck: Supple without JVD or lymphadenopathy ?Heart: Reg rate and rhythm. No murmurs rubs or gallops ?Chest: CTA bilaterally without wheezes, rales, or rhonchi; no distress ?Abdomen: Soft, non-tender, non-distended, bowel sounds positive. ?Extremities: No clubbing, cyanosis, 1+ edema left lower extremity and right upper extremity.. Pulses are 2+ ?Psych: Pt's affect is appropriate. Pt is cooperative.  Slightly anxious when I was in the room ?Skin: Clean and intact without signs of breakdown.  A few scattered bruises ?Neuro:  Alert and oriented x 3. Normal insight and awareness. Intact Memory. Normal language and speech. Cranial nerve exam  unremarkable.  No focal motor or sensory abnormalities are appreciated.  She is limited in her right upper extremity movement due to her orthopedic injury and splint ?Musculoskeletal: Right upper extremity splint is wrapped with gutter underneath.  The wrapping is a bit loose but overall appropriate.  She has a hinged knee brace left lower extremity. ? ? ?Assessment/Plan: ?1. Functional deficits which require 3+ hours per day of interdisciplinary therapy in a comprehensive inpatient rehab setting. ?Physiatrist is providing close team supervision and 24 hour management of active medical problems listed below. ?Physiatrist and rehab team continue to assess barriers to discharge/monitor patient progress toward functional and medical goals ? ?Care Tool: ? ?Bathing ?   ?   ? Body parts bathed by helper: Left arm, Chest, Front perineal area, Buttocks, Abdomen, Right upper leg, Right lower leg, Left upper leg, Face ?Body parts n/a: Right arm, Left lower leg ?  ?Bathing assist Assist Level: Minimal Assistance - Patient > 75% ?  ?  ?Upper Body Dressing/Undressing ?Upper body dressing   ?What is the patient wearing?: Hospital gown only ?   ?Upper body assist Assist Level: Moderate Assistance - Patient 50 - 74% ?   ?Lower Body Dressing/Undressing ?Lower body dressing ? ? ?   ?What is the patient wearing?: Hospital gown only ? ?  ? ?  Lower body assist Assist for lower body dressing: Moderate Assistance - Patient 50 - 74% ?   ? ?Toileting ?Toileting    ?Toileting assist Assist for toileting: Moderate Assistance - Patient 50 - 74% ?  ?  ?Transfers ?Chair/bed transfer ? ?Transfers assist ?   ? ?Chair/bed transfer assist level: Moderate Assistance - Patient 50 - 74% ?  ?  ?Locomotion ?Ambulation ? ? ?Ambulation assist ? ? Ambulation activity did not occur: Safety/medical concerns ? ?  ?  ?   ? ?Walk 10 feet activity ? ? ?Assist ? Walk 10 feet activity did not occur: Safety/medical concerns ? ?  ?   ? ?Walk 50 feet  activity ? ? ?Assist Walk 50 feet with 2 turns activity did not occur: Safety/medical concerns ? ?  ?   ? ? ?Walk 150 feet activity ? ? ?Assist Walk 150 feet activity did not occur: Safety/medical concerns ? ?  ?  ?  ? ?Walk 10 feet on uneven surface  ?activity ? ? ?Assist Walk 10 feet on uneven surfaces activity did not occur: Safety/medical concerns ? ? ?  ?   ? ?Wheelchair ? ? ? ? ?Assist Is the patient using a wheelchair?: Yes ?Type of Wheelchair: Manual ?  ? ?Wheelchair assist level: Minimal Assistance - Patient > 75% ?   ? ? ?Wheelchair 50 feet with 2 turns activity ? ? ? ?Assist ? ?  ?  ? ? ?Assist Level: Minimal Assistance - Patient > 75%  ? ?Wheelchair 150 feet activity  ? ? ? ?Assist ?   ? ? ?Assist Level: Moderate Assistance - Patient 50 - 74%  ? ?Blood pressure (!) 102/51, pulse 61, temperature 99.1 ?F (37.3 ?C), temperature source Oral, resp. rate 18, height 5\' 7"  (1.702 m), weight 81.5 kg, SpO2 93 %. ? ?Medical Problem List and Plan: ?1. Functional deficits secondary to fall with multiple fractures ?            -patient may shower but incisions must be covered.  ?            -ELOS/Goals: 7-9 days S ?          -Patient is beginning CIR therapies today including PT and OT  ?2.  Antithrombotics: ?-DVT/anticoagulation:  Pharmaceutical: Other (comment) aspirin 325 mg daily ?            -antiplatelet therapy: Aspirin 325 mg daily ?3. Postoperative pain: continue Tylenol, oxycodone as needed ?4. Depression/anxiety: CSW to evaluate and provide support ?            -antipsychotic agents: n/a ?            -continue Lexapro ? -Add low-dose Xanax as needed for anxiety ?5. Neuropsych: This patient is capable of making decisions on her own behalf. ?6. Skin/Wound Care: Routine skin care checks ?            -- Monitor RUE surgical incision once splint is removed; has nylon sutures ? -Splint appears to be wrapped appropriately and intact.  No need for [employer] to see at this point ?7. Fluids/Electrolytes/Nutrition:  Routine Is and Os and follow-up chemistries ?8. Right olecranon fracture s/p ORIF ?            --NWB; splint in place, Nylon sutures ?9: Left tibial plateau fracture: touch down weight-bearing ?            --she is wearing a brace ?10: Left radial head fracture: NWB LUE ?-may use arm for  activity/ADLs ?11: DM2: continue SSI AC/HS and CBGs ?            5/6-- CBGs are a bit labile ? -Begin Tradjenta in place of patient's Januvia that she uses at home ?12: ABLA: improving; follow-up CBC ?13: Constipation: is chronic; had BM yesterday. Stools softeners not usually effective ?14: Hypoalbuminemia: continue protein supplements ?15: Tobacco use: cessation counseling; continue Nicoderm ?16: Hypertension: continue Ziac ? 5/6 BP controlled at present ?17: Hyperlipidemia: continue Zetia, Crestor ?  ? ?LOS: ?3 days ?A FACE TO FACE EVALUATION WAS PERFORMED ? ?Fanny Dance ?12/15/2021, 7:31 AM  ? ?  ?

## 2021-12-15 NOTE — Progress Notes (Signed)
Physical Therapy Session Note ? ?Patient Details  ?Name: Alexandra Parsons ?MRN: [id number] ?Date of Birth: [birth year] ? ?Today's Date: 12/15/2021 ?PT Individual Time: 1400-1500 ?PT Individual Time Calculation (min): 60 min  ? ?Short Term Goals: ?Week 1:  PT Short Term Goal 1 (Week 1): STG= LTG based on ELOS ? ?Skilled Therapeutic Interventions/Progress Updates:  ?  Pt presents in bed and agreeable and eager for session. Reports feeling less woozy/groggy this PM. Focused on functional transfers of various surfaces throughout session, home set-up and problem solving, w/c mobility and parts managemnet training, and simulated car transfer. Pt declined use of sling for RUE this session due to reporting it caused her discomfort when wearing it this morning. Does require frequent cues to not use RUE when sling not donned. Pt requires min to light mod assist at times for sit > stands during PM session due to fatigue and preference of technique for momentum/rocking. Performed w/c propulsion on unit with focus on technique and functional strengthening/endurance at supervision level. Pt also starting to manage parts and locking brakes - will benefit from brake extenders upon d/c due to LUE only able to use. Pt performed simulated car transfer including short distance ambulation with HW x 5' with overall min assist and cues for technique to enter and exit car but managed LLE independently. Did require mod assist to exit car due to lower surface. In ADL apartment peformed w/c mobility in home environment including over carpeted surface, in and out of bathroom and kitchen, and around furniture. Pt performed toilet transfer with min assist in bathroom and hygiene independent, cues for technique and to decrease use of RUE. Practiced w/c <> regular bed transfer as well including bed mobility and discussed access within her home. Pt practiced getting items out of cabinets and fridge from w/c level and opening doors discussing her ktichen  vs.this set up. End of session returned back to bed with min to mod assist due to fatigue/pain and repositioned in supine at supervision level. Doffed Bledsoe brace at this time to allow leg to breathe and plans to ask for assist to put it on again later. All needs in reach.  ? ?Added LTG for short distance gait to reflect updated LUE WB status. May need further goal modification if other WB restrictions are lifted/modified. ? ?Therapy Documentation ?Precautions:  ?Precautions ?Precautions: Fall ?Required Braces or Orthoses: Other Brace ?Knee Immobilizer - Left: On when out of bed or walking ?Other Brace: bledose brace can be unlocked but on for support during ambulation ?Restrictions ?Weight Bearing Restrictions: Yes ?RUE Weight Bearing: Non weight bearing ?LUE Weight Bearing: Weight bearing as tolerated (no restrictions on activity per ortho MD 5.8) ?LLE Weight Bearing: Touchdown weight bearing ?Other Position/Activity Restrictions: "She can do anything with her left arm. She can platform, she can use crutches, any activity as tolerated." Per ortho MD 5.8 ? ?Pain: ?Premedicated for pain and did not rate. C/o of L rib area/flank pain mostly and some pain the L wrist and RUE. ? ? ? ? ?Therapy/Group: Individual Therapy ? ?Tedd Sias ?Philip Aspen, PT, DPT, CBIS ? ?12/15/2021, 3:27 PM  ?

## 2021-12-15 NOTE — Progress Notes (Signed)
Occupational Therapy Session Note ? ?Patient Details  ?Name: Alexandra Parsons ?MRN: [id number] ?Date of Birth: Aug 16, 1960 ? ?Today's Date: 12/15/2021 ?OT Individual Time: 9381-8299 ?OT Individual Time Calculation (min): 65 min  ? ? ?Short Term Goals: ?Week 1:  OT Short Term Goal 1 (Week 1): Pt will complete toileting with supervision ?OT Short Term Goal 2 (Week 1): Pt will complete bathroom transfers with supervision while adhering to [facility] precautions ?OT Short Term Goal 3 (Week 1): PT will complete LB dressing with supervision ? ?Skilled Therapeutic Interventions/Progress Updates:  ?  Pt received in bed finishing her coffee. Due to previous weight bearing orders and recommendations to use a slide board only, obtained a drop arm BSC for pt to use as she only had a standard.   ? ?Pt able to sit to EOB and used slide board to wc to her R with supervision and good adherance to LLE TDWB but did need cues to not use R hand.   ?Pt very eager to wash up.  Suggested we do so on the toilet so she could see if she needed to go and practice that transfer. Pt set up to do a slide board but pt kept saying that she felt it was way too awkward and felt she could safely do a squat pivot on R leg only.  Pt able to do so with min A.  Worked on bathing from [facility] over toilet. See ADL documentation below.   ?To return to w/c , pt stated she could safely stand and transfer but wanted to use L arm lightly on bar. As of time of session, orders were NWB except for ADLs.   ? ?Pt able to push to stand with R leg only and pivoted on foot only using L foot for steadying balance.   ? ?From wc pt completed dressing.  Set pt up in wc with all needs met.  ? ?(Post session received clarified orders on use LUE as activity and WBAT) ? ?Therapy Documentation ?Precautions:  ?Precautions ?Precautions: Fall ?Required Braces or Orthoses: Other Brace ?Knee Immobilizer - Left: On when out of bed or walking ?Other Brace: bledose brace can be unlocked but on for  support during ambulation ?Restrictions ?Weight Bearing Restrictions: Yes ?RUE Weight Bearing: Non weight bearing ?LUE Weight Bearing: Weight bearing as tolerated (no restrictions on activity per ortho MD 5.8) ?LLE Weight Bearing: Touchdown weight bearing ?Other Position/Activity Restrictions: "She can do anything with her left arm. She can platform, she can use crutches, any activity as tolerated." Per ortho MD 5.8 ? ?Pain: ?No c/o pain during OT session  ?ADL: ?ADL ?Eating: Set up ?Where Assessed-Eating: Bed level ?Grooming: Setup ?Where Assessed-Grooming: Wheelchair ?Upper Body Bathing: Minimal assistance ?Where Assessed-Upper Body Bathing: Other (Comment) (toilet) ?Lower Body Bathing: Moderate assistance ?Where Assessed-Lower Body Bathing: Other (Comment) (toilet) ?Upper Body Dressing: Setup ?Where Assessed-Upper Body Dressing: Wheelchair ?Lower Body Dressing: Moderate assistance ?Where Assessed-Lower Body Dressing: Wheelchair ?Toileting: Moderate assistance ?Where Assessed-Toileting: Toilet ?Toilet Transfer: Minimal assistance ?Toilet Transfer Method: Stand pivot ?Toilet Transfer Equipment: Raised toilet seat ?Tub/Shower Transfer: Not assessed ?Walk-In Shower Transfer: Not assessed ? ? ?Therapy/Group: Individual Therapy ? ?[place] ?12/15/2021, 12:22 PM ?

## 2021-12-15 NOTE — Progress Notes (Signed)
Physical Therapy Session Note ? ?Patient Details  ?Name: Alexandra Parsons ?MRN: [id number] ?Date of Birth: [birth year] ? ?Today's Date: 12/15/2021 ?PT Individual Time: 3536-1443 ?PT Individual Time Calculation (min): 75 min  ? ?Short Term Goals: ?Week 1:  PT Short Term Goal 1 (Week 1): STG= LTG based on ELOS ? ?Skilled Therapeutic Interventions/Progress Updates:  ?  Discussed updated clarifications about WB and activity status from ortho MD including now cleared for all use of LUE to tolerance with any AD, bledsoe brace cleared to be unlocked and used for ambulation support only, and potential plan for updating/evaluation WB status of LLE end of the week. Pt eager for these changes! ? ?Pt performed w/c propulsion for functional mobility training and overall strengthening and endurance x 150' to and from therapy gym with RLE and LUE. ? ?Introduced and education on hemiwalker to be able to progress transfers and short distance gait. Pt initially with difficulty with initial sit > stand due to reported fatigue requiring mod assist but then able to perform with min assist. Cues for hand placement and technique. In standing able to initiate about 3 steps with TDWB status on L but due to overall fatigue and reported grogginess, deferred continued upright due to tolerance. Will plan to attempt again this PM.  ? ?Focused on supine therex for ROM and functional strengtheing to aid with overall mobility. Performed AAROM for LLE including ankle ROM, heel slides, hip abduction, and SAQ x 10 reps each. Decreased ankle DF actively noted but able to achieve full ROM with assist. Performed same exercises on R with 3# ankle weight. Cues provided for technique. Noted 90 degrees active L knee flexion in supine.  ? ?Performed bed mobility with supervision and extra time on flat surface. ? ?Performed stand pivot transfer <> bed <> w/c (and back in room) with overall heavier min assist due to fatigue and cues for technique with pt tendency to  use momentum to power up. Repositioned self in supine and handoff to NT in room to assess blood sugar.  ? ? ?Therapy Documentation ?Precautions:  ?Precautions ?Precautions: Fall ?Required Braces or Orthoses: Other Brace ?Knee Immobilizer - Left: On when out of bed or walking ?Other Brace: bledose brace can be unlocked but on for support during ambulation ?Restrictions ?Weight Bearing Restrictions: Yes ?RUE Weight Bearing: Non weight bearing ?LUE Weight Bearing: Weight bearing as tolerated (no restrictions on activity per ortho MD 5.8) ?LLE Weight Bearing: Touchdown weight bearing ?Other Position/Activity Restrictions: "She can do anything with her left arm. She can platform, she can use crutches, any activity as tolerated." Per ortho MD 5.8 ? ? ?Pain: ?Pain Assessment ?Pain Scale: 0-10 ?Pain Score: 10-Worst pain ever ?Pain Type: Surgical pain ?Pain Intervention(s): Medication (See eMAR) ?Reports mainly pain in RUE but generally can be all over. Declines medication at this time. Also educated on use of ice as needed as an option as well. ?Requested pain meds at end of session. ? ? ? ?Therapy/Group: Individual Therapy ? ?Tedd Sias ?Philip Aspen, PT, DPT, CBIS ? ?12/15/2021, 12:05 PM  ?

## 2021-12-15 NOTE — Progress Notes (Signed)
?                                                       PROGRESS NOTE ? ? ?Subjective/Complaints: ?Patient in bed in AM. No complaints or concerns. She is looking forward to working with therapy. ? ?Review of Systems  ?Constitutional:  Negative for chills and fever.  ?Respiratory:  Negative for shortness of breath.   ?Cardiovascular:  Negative for chest pain.  ?Gastrointestinal:  Negative for nausea and vomiting.   ? ? ?Objective: ?  ?No results found. ?Recent Labs  ?  12/15/21 ?1200  ?WBC 9.2  ?HGB 11.3*  ?HCT 32.8*  ?PLT 236  ? ? ?No results for input(s): NA, K, CL, CO2, GLUCOSE, BUN, CREATININE, CALCIUM in the last 72 hours. ? ? ?Intake/Output Summary (Last 24 hours) at 12/15/2021 1302 ?Last data filed at 12/14/2021 2100 ?Gross per 24 hour  ?Intake 120 ml  ?Output --  ?Net 120 ml  ? ?  ? ?  ? ?Physical Exam: ?Vital Signs ?Blood pressure (!) 102/51, pulse 61, temperature 99.1 ?F (37.3 ?C), temperature source Oral, resp. rate 18, height 5\' 7"  (1.702 m), weight 81.5 kg, SpO2 93 %. ? ? ?General: Alert and oriented x 3, No apparent distress ?HEENT: Head is normocephalic, atraumatic, PERRLA, EOMI, sclera anicteric, oral mucosa pink and moist, dentition intact, ext ear canals clear,  ?Neck: Supple without JVD or lymphadenopathy ?Heart: Reg rate and rhythm. No murmurs rubs or gallops ?Chest: CTA bilaterally without wheezes, rales, or rhonchi; no distress ?Abdomen: Soft, non-tender, non-distended, bowel sounds positive. ?Extremities: No clubbing, cyanosis, or edema. Pulses are 2+ ?Psych: Pt's affect is appropriate. Pt is cooperative ?Skin: Clean and intact without signs of breakdown ?Neuro:  Alert and oriented. Follows commands.  CN 2-12 intact. Intact sensation in all 4 to LT. Moving all 4 extremities. ?Musculoskeletal: Right upper extremity splint. Hinged bledsoe knee brace RLE ?PIV RUE ? ? ?Assessment/Plan: ?1. Functional deficits which require 3+ hours per day of interdisciplinary therapy in a comprehensive inpatient  rehab setting. ?Physiatrist is providing close team supervision and 24 hour management of active medical problems listed below. ?Physiatrist and rehab team continue to assess barriers to discharge/monitor patient progress toward functional and medical goals ? ?Care Tool: ? ?Bathing ?   ?Body parts bathed by patient: Chest, Abdomen, Front perineal area, Right upper leg, Left upper leg, Right lower leg, Face, Right arm (R axilla only as arm in a cast)  ? Body parts bathed by helper: Left arm, Buttocks ?Body parts n/a: Left lower leg ?  ?Bathing assist Assist Level: Minimal Assistance - Patient > 75% ?  ?  ?Upper Body Dressing/Undressing ?Upper body dressing   ?What is the patient wearing?: Pull over shirt ?   ?Upper body assist Assist Level: Set up assist ?   ?Lower Body Dressing/Undressing ?Lower body dressing ? ? ?   ?What is the patient wearing?: Pants ? ?  ? ?Lower body assist Assist for lower body dressing: Moderate Assistance - Patient 50 - 74% ?   ? ?Toileting ?Toileting    ?Toileting assist Assist for toileting: Moderate Assistance - Patient 50 - 74% ?  ?  ?Transfers ?Chair/bed transfer ? ?Transfers assist ?   ? ?Chair/bed transfer assist level: Minimal Assistance - Patient > 75% ?  ?  ?Locomotion ?Ambulation ? ? ?  Ambulation assist ? ? Ambulation activity did not occur: Safety/medical concerns ? ?Assist level: Minimal Assistance - Patient > 75% ?Assistive device: Walker-hemi ?Max distance: 2'  ? ?Walk 10 feet activity ? ? ?Assist ? Walk 10 feet activity did not occur: Safety/medical concerns ? ?  ?   ? ?Walk 50 feet activity ? ? ?Assist Walk 50 feet with 2 turns activity did not occur: Safety/medical concerns ? ?  ?   ? ? ?Walk 150 feet activity ? ? ?Assist Walk 150 feet activity did not occur: Safety/medical concerns ? ?  ?  ?  ? ?Walk 10 feet on uneven surface  ?activity ? ? ?Assist Walk 10 feet on uneven surfaces activity did not occur: Safety/medical concerns ? ? ?  ?   ? ?Wheelchair ? ? ? ? ?Assist Is the  patient using a wheelchair?: Yes ?Type of Wheelchair: Manual ?  ? ?Wheelchair assist level: Supervision/Verbal cueing ?Max wheelchair distance: 150'  ? ? ?Wheelchair 50 feet with 2 turns activity ? ? ? ?Assist ? ?  ?  ? ? ?Assist Level: Supervision/Verbal cueing  ? ?Wheelchair 150 feet activity  ? ? ? ?Assist ?   ? ? ?Assist Level: Supervision/Verbal cueing  ? ?Blood pressure (!) 102/51, pulse 61, temperature 99.1 ?F (37.3 ?C), temperature source Oral, resp. rate 18, height 5\' 7"  (1.702 m), weight 81.5 kg, SpO2 93 %. ? ?Medical Problem List and Plan: ?1. Functional deficits secondary to fall with multiple fractures ?            -patient may shower but incisions must be covered.  ?            -ELOS/Goals: 7-9 days S ?          -Continue CIR therapies including PT, OT  ? 5/7 -Pt is limited by bilateral UE NWB. She indicates that there is not someone at home to help with mobility. I reviewed intra-articular left radial head fx. It is minimal/non-displaced.  ? 5/8  Ortho Dr. Sammuel Hines reports pt may do any activity with LUE.   ?2.  Antithrombotics: ?-DVT/anticoagulation:  Pharmaceutical: Other (comment) aspirin 325 mg daily ?            -antiplatelet therapy: Aspirin 325 mg daily ?3. Postoperative pain: continue Tylenol, oxycodone as needed ? 5/7 pt reports pain is controlled ?4. Depression/anxiety: CSW to evaluate and provide support ?            -antipsychotic agents: n/a ?            -continue Lexapro ? -continue low-dose Xanax as needed for anxiety ?5. Neuropsych: This patient is capable of making decisions on her own behalf. ?6. Skin/Wound Care: Routine skin care checks ?            -- Monitor RUE surgical incision once splint is removed; has nylon sutures ? -Splint appears to be wrapped appropriately and intact.  ?7. Fluids/Electrolytes/Nutrition: Routine Is and Os and follow-up chemistries ?8. Right olecranon fracture s/p ORIF ?            --NWB; splint in place, Nylon sutures ?9: Left tibial plateau fracture:  touch down weight-bearing ?            -5/8 Ortho reports L Bledsoe leg brace to be locked in extension when ambulating, can be unlocked when in bed ?10: Left radial head fracture: NWB LUE ?-may use arm for activity/ADLs ?-*see #1 ?11: DM2: continue SSI AC/HS and CBGs ? CBG (last 3)  ?  Recent Labs  ?  12/14/21 ?[id number] 12/15/21 ?0606 12/15/21 ?1158  ?GLUCAP 203* 158* 160*  ?             5/7 improvement in cbg's yesterday ? -continueTradjenta in place of patient's Januvia that she uses at home ? -5/8 CBG stable, continue to monitor ?12: ABLA: improving ? -5/8 CBC 11.3, improved continue to monitor ?13: Constipation: is chronic; had BM yesterday. Stools softeners not usually effective ?14: Hypoalbuminemia: continue protein supplements ?15: Tobacco use: cessation counseling; continue Nicoderm ?16: Hypertension: continue Ziac ? 5/7 BP controlled at present ?17: Hyperlipidemia: continue Zetia, Crestor ?  ? ?LOS: ?3 days ?A FACE TO FACE EVALUATION WAS PERFORMED ? ?Jennye Boroughs ?12/15/2021, 1:02 PM  ? ?  ?

## 2021-12-15 NOTE — Progress Notes (Signed)
[facility] ?Individual Statement of Services ? ?Patient Name:  Alexandra Parsons  ?Date:  12/15/2021 ? ?Welcome to the [facility].  Our goal is to provide you with an individualized program based on your diagnosis and situation, designed to meet your specific needs.  With this comprehensive rehabilitation program, you will be expected to participate in at least 3 hours of rehabilitation therapies Monday-Friday, with modified therapy programming on the weekends. ? ?Your rehabilitation program will include the following services:  Physical Therapy (PT), Occupational Therapy (OT), 24 hour per day rehabilitation nursing, Therapeutic Recreaction (TR), Care Coordinator, Rehabilitation Medicine, Nutrition Services, and Pharmacy Services ? ?Weekly team conferences will be held on Wednesday to discuss your progress.  Your Inpatient Rehabilitation Care Coordinator will talk with you frequently to get your input and to update you on team discussions.  Team conferences with you and your family in attendance may also be held. ? ?Expected length of stay: 10-14 days  Overall anticipated outcome: supervision-independent level ? ?Depending on your progress and recovery, your program may change. Your Inpatient Rehabilitation Care Coordinator will coordinate services and will keep you informed of any changes. Your Inpatient Rehabilitation Care Coordinator's name and contact numbers are listed  below. ? ?The following services may also be recommended but are not provided by the Inpatient [facility]:  ?Driving Evaluations ?Home Health Rehabiltiation Services ?Outpatient Rehabilitation Services ?Vocational Rehabilitation ?  ?Arrangements will be made to provide these services after discharge if needed.  Arrangements include referral to agencies that provide these services. ? ?Your insurance has been verified to be:  workers Comp ?Your primary doctor is:  Willow Ora ? ?Pertinent information  will be shared with your doctor and your insurance company. ? ?Inpatient Rehabilitation Care Coordinator:  Dossie Der, LCSW [phone] or (C) [phone] ? ?Information discussed with and copy given to patient by: Lucy Chris, 12/15/2021, 12:19 PM    ?

## 2021-12-16 DIAGNOSIS — S82102D Unspecified fracture of upper end of left tibia, subsequent encounter for closed fracture with routine healing: Secondary | ICD-10-CM | POA: Diagnosis not present

## 2021-12-16 DIAGNOSIS — E119 Type 2 diabetes mellitus without complications: Secondary | ICD-10-CM | POA: Diagnosis not present

## 2021-12-16 DIAGNOSIS — R300 Dysuria: Secondary | ICD-10-CM

## 2021-12-16 DIAGNOSIS — T07XXXA Unspecified multiple injuries, initial encounter: Secondary | ICD-10-CM | POA: Diagnosis not present

## 2021-12-16 LAB — URINALYSIS, ROUTINE W REFLEX MICROSCOPIC
Bilirubin Urine: NEGATIVE
Glucose, UA: NEGATIVE mg/dL
Hgb urine dipstick: NEGATIVE
Ketones, ur: NEGATIVE mg/dL
Nitrite: NEGATIVE
Protein, ur: NEGATIVE mg/dL
Specific Gravity, Urine: 1.008 (ref 1.005–1.030)
pH: 5 (ref 5.0–8.0)

## 2021-12-16 LAB — GLUCOSE, CAPILLARY
Glucose-Capillary: 151 mg/dL — ABNORMAL HIGH (ref 70–99)
Glucose-Capillary: 143 mg/dL — ABNORMAL HIGH (ref 70–99)
Glucose-Capillary: 153 mg/dL — ABNORMAL HIGH (ref 70–99)
Glucose-Capillary: 161 mg/dL — ABNORMAL HIGH (ref 70–99)

## 2021-12-16 NOTE — Progress Notes (Signed)
Physical Therapy Session Note ? ?Patient Details  ?Name: Alexandra Parsons ?MRN: [id number] ?Date of Birth: 1960-10-18 ? ?Today's Date: 12/16/2021 ?PT Individual Time: 3382-5053 ?PT Individual Time Calculation (min): 72 min  ? ?Short Term Goals: ?Week 1:  PT Short Term Goal 1 (Week 1): STG= LTG based on ELOS ? ?Skilled Therapeutic Interventions/Progress Updates:  ?  Pt presents in bed reporting feeling a little out of it this morning. Pt also reports feeling like she is having issues with bladder (urgency and smell) - ended up notifying MD during rounds as well.  ? ?Donned pants and then Bledsoe brace with pt performing independently with extra time. Bed mobility with supervision with cues for adherence to NWB on RUE, declines sling again at this time. Mod assist for initial sit > stand from EOB to pull up pants and performs stand pivot transfer to the w/c with min assist and cues for TDWB status on the R. W/c propulsion in room to get up to sink to perform oral hygiene overall mod I.  ? ?Called daughter during session to ask about bringing in shoes and clothing. Recommended shoe to help with transfers and w/c mobility. Ended up finding a pair of shoes and socks in drawer so pt donned R sock and shoe seated in w/c independently. ? ?Functional w/c propulsion in hallway for strengthening and endurance with LUE and RLE with supervision due to cues to adhere to RUE NWB status > 150' and then progressed to dynamic activity to simulate home environment throuhg obstacles including picking up items from floor.  ? ?Demonstrated to pt how to unlock and lock Bledsoe brace as well and that must be locked for any standing activities. Verbalized and demonstrated understanding. Left up in w/c at end of session with safety belt donned.  ? ?Therapy Documentation ?Precautions:  ?Precautions ?Precautions: Fall ?Required Braces or Orthoses: Other Brace ?Knee Immobilizer - Left: On when out of bed or walking ?Other Brace: bledose brace can be  unlocked but on for support during ambulation ?Restrictions ?Weight Bearing Restrictions: Yes ?RUE Weight Bearing: Non weight bearing ?LUE Weight Bearing: Weight bearing as tolerated ?LLE Weight Bearing: Touchdown weight bearing ?Other Position/Activity Restrictions: "She can do anything with her left arm. She can platform, she can use crutches, any activity as tolerated." Per ortho MD 5.8 ? ? ?Pain: ?Pt reports just having pain medication nad hoping for it to kick in - rated at 8/10 pain in LLE, RUE, and LUE as well as ribs on L.  ? ? ? ? ?Therapy/Group: Individual Therapy ? ?Tedd Sias ?Philip Aspen, PT, DPT, CBIS ? ?12/16/2021, 9:48 AM  ?

## 2021-12-16 NOTE — Progress Notes (Signed)
Physical Therapy Session Note ? ?Patient Details  ?Name: Alexandra Parsons ?MRN: [id number] ?Date of Birth: [birth year] ? ?Today's Date: 12/16/2021 ?PT Individual Time: 0076-2263 ?PT Individual Time Calculation (min): 32 min  ? ?Short Term Goals: ?Week 1:  PT Short Term Goal 1 (Week 1): STG= LTG based on ELOS ? ?Skilled Therapeutic Interventions/Progress Updates:  ?  Session focused on w/c propulsion, functional transfers stand step with HW, and supine therex for LLE strengthening/ROM. Pt propelled w/c mod I on unit with extra time due to rest breaks needed and reminders for adherence to RUE NWB status (declines sling). Focused on pt managing w/c parts for transfer set up including legrests.performed min assist stand step with HW with focus on adhereing on to TDWB status on LLE with brace locked into extension. Pt able to demonstrate unlocking for therex in supine (and relocking for transfer back). Bed mobility mod I. Pt instructed in supine heel slides, hip abduction, and seated LAQ x 10 reps each - decreased recall from these exercises form yesterday. Transferred back to w/c the same fashion and then again back to bed in the room to rest before next session per request. Pt reports being overall fatigued from therapies today. All needs in reach.  ? ?Therapy Documentation ?Precautions:  ?Precautions ?Precautions: Fall ?Required Braces or Orthoses: Other Brace ?Knee Immobilizer - Left: On when out of bed or walking ?Other Brace: bledose brace can be unlocked but on for support during ambulation ?Restrictions ?Weight Bearing Restrictions: Yes ?RUE Weight Bearing: Non weight bearing ?LUE Weight Bearing: Weight bearing as tolerated ?LLE Weight Bearing: Touchdown weight bearing ?Other Position/Activity Restrictions: "She can do anything with her left arm. She can platform, she can use crutches, any activity as tolerated." Per ortho MD 5.8 ? ? ?Pain: ? Premedicated for pain in ribs, UEs and LLE. Did not rate.   ? ? ? ?Therapy/Group: Individual Therapy ? ?Tedd Sias ?Philip Aspen, PT, DPT, CBIS ? ?12/16/2021, 1:40 PM  ?

## 2021-12-16 NOTE — IPOC Note (Signed)
Overall Plan of Care (IPOC) ?Patient Details ?Name: Alexandra Parsons ?MRN: [id number] ?DOB: Jan 08, 1961 ? ?Admitting Diagnosis: Critical polytrauma ? ?Hospital Problems: Principal Problem: ?  Critical polytrauma ? ? ? ? Functional Problem List: ?Nursing Bladder, Medication Management, Safety, Bowel, Pain, Endurance, Skin Integrity  ?PT Balance, Edema, Endurance, Motor, Nutrition, Pain, Perception, Safety, Sensory, Skin Integrity  ?OT Balance, Safety, Cognition, Skin Integrity, Edema, Endurance, Motor, Pain, Perception  ?SLP    ?TR    ?    ? Basic ADL?s: ?OT Grooming, Bathing, Dressing, Toileting  ? ?  Advanced  ADL?s: ?OT Simple Meal Preparation  ?   ?Transfers: ?PT Bed Mobility, Bed to Chair, Car, Furniture  ?OT Toilet, Tub/Shower  ? ?  Locomotion: ?PT Wheelchair Mobility  ? ?  Additional Impairments: ?OT Fuctional Use of Upper Extremity  ?SLP   ?  ?   ?TR    ? ? ?Anticipated Outcomes ?Item Anticipated Outcome  ?Self Feeding n/a  ?Swallowing ?   ?  ?Basic self-care ? Mod I  ?Toileting ? Mod I ?  ?Bathroom Transfers supervision shower, mod I toilet  ?Bowel/Bladder ? manage bowel w mod I and bladder w toileting  ?Transfers ? spv  ?Locomotion ? ModI wc  ?Communication ?    ?Cognition ?    ?Pain ? pain at or below level 4 with prns  ?Safety/Judgment ? maintain safety w cues  ? ?Therapy Plan: ?PT Intensity: Minimum of 1-2 x/day ,45 to 90 minutes ?PT Frequency: 5 out of 7 days ?PT Duration Estimated Length of Stay: 10 days ?OT Intensity: Minimum of 1-2 x/day, 45 to 90 minutes ?OT Frequency: 5 out of 7 days ?OT Duration/Estimated Length of Stay: 10-14 days ?   ? ?Due to the current state of emergency, patients may not be receiving their 3-hours of Medicare-mandated therapy. ? ? Team Interventions: ?Nursing Interventions Bladder Management, Disease Management/Prevention, Medication Management, Discharge Planning, Pain Management, Bowel Management, Patient/Family Education  ?PT interventions Discharge planning, Functional  mobility training, Psychosocial support, Therapeutic Activities, Balance/vestibular training, Disease management/prevention, Neuromuscular re-education, Skin care/wound management, Therapeutic Exercise, Wheelchair propulsion/positioning, Cognitive remediation/compensation, DME/adaptive equipment instruction, Pain management, Splinting/orthotics, UE/LE Strength taining/ROM, Community reintegration, Functional electrical stimulation, Patient/family education, UE/LE Coordination activities  ?OT Interventions Balance/vestibular training, Disease mangement/prevention, Self Care/advanced ADL retraining, Therapeutic Exercise, Wheelchair propulsion/positioning, Cognitive remediation/compensation, Pain management, UE/LE Strength taining/ROM, Skin care/wound managment, DME/adaptive equipment instruction, Community reintegration, Functional electrical stimulation, Splinting/orthotics, Patient/family education, UE/LE Coordination activities, Discharge planning, Functional mobility training, Psychosocial support, Therapeutic Activities  ?SLP Interventions    ?TR Interventions    ?SW/CM Interventions Discharge Planning, Psychosocial Support, Patient/Family Education  ? ?Barriers to Discharge ?MD  Medical stability  ?Nursing Decreased caregiver support, Home environment access/layout, Wound Care, Weight bearing restrictions ?1 level 3ste ; daughter near by  ?PT Inaccessible home environment, Decreased caregiver support, Home environment access/layout, Lack of/limited family support, Weight bearing restrictions ?   ?OT   ?   ?SLP   ?   ?SW Weight bearing restrictions, Lack of/limited family support ?   ? ?Team Discharge Planning: ?Destination: PT-Home ,OT- Home , SLP-  ?Projected Follow-up: PT-24 hour supervision/assistance, Home health PT, OT-  Other (comment) (intermittent supervision), SLP-  ?Projected Equipment Needs: PT-To be determined, OT- To be determined, SLP-  ?Equipment Details: PT- , OT-pt has some DME ?Patient/family  involved in discharge planning: PT- Patient,  OT-Patient, SLP-  ? ?MD ELOS: 7-9 days ?Medical Rehab Prognosis:  Excellent ?Assessment: The patient has been admitted for CIR therapies with the  diagnosis of multiple fractures. The team will be addressing functional mobility, strength, stamina, balance, safety, adaptive techniques and equipment, self-care, bowel and bladder mgt, patient and caregiver education. Goals have been set at supervision. Anticipated discharge destination is home. ? ? ?   ? ? ?See Team Conference Notes for weekly updates to the plan of care  ?

## 2021-12-16 NOTE — Progress Notes (Signed)
Occupational Therapy Session Note ? ?Patient Details  ?Name: Alexandra Parsons ?MRN: [id number] ?Date of Birth: [birth year] ? ?Today's Date: 12/16/2021 ?OT Individual Time: 1497-0263 ?OT Individual Time Calculation (min): 45 min  ? ? ?Short Term Goals: ?Week 1:  OT Short Term Goal 1 (Week 1): Pt will complete toileting with supervision ?OT Short Term Goal 2 (Week 1): Pt will complete bathroom transfers with supervision while adhering to [facility] precautions ?OT Short Term Goal 3 (Week 1): PT will complete LB dressing with supervision ? ?Skilled Therapeutic Interventions/Progress Updates:  ?  Pt resting in bed upon OT arrival and reports working hard with PT and OT earlier this day. L LE Bledsoe brace in place and leg elevated slightly on a pillow and R UE elevated on pillow props with no pain reported at rest. Supine to sit with Set up and pt needed min cues for verbalizing recall of all precautions and sequencing steps of transfers. Bed to and from w/c and drop arm 3 in 1 over toilet with min A to maintain TTWB L LE and NWB L UE. Pt reports mild soreness in L thumb CMC region with weightbearing on grab bars and hemi walker. OT to explore a thrumb support orthoses if helpful. Pt was unable to have BM on toilet and returned to bed for more rest before PM meal with call bell, bed alarm and needs in place.  ? ? ? ?Therapy/Group: Individual Therapy ? ?Vicenta Dunning ?12/16/2021, 2:59 PM ?

## 2021-12-16 NOTE — Progress Notes (Signed)
?                                                       PROGRESS NOTE ? ? ?Subjective/Complaints: ?Patient in bed in AM. Reports some mild dysuria. She reports increased comfort wearing leg brace. No additional concerns.  ? ?Review of Systems  ?Constitutional:  Negative for chills and fever.  ?Respiratory:  Negative for shortness of breath.   ?Cardiovascular:  Negative for chest pain.  ?Gastrointestinal:  Negative for nausea and vomiting.  ?Genitourinary:  Positive for dysuria, frequency and urgency. Negative for flank pain and hematuria.   ? ? ?Objective: ?  ?No results found. ?Recent Labs  ?  12/15/21 ?1200  ?WBC 9.2  ?HGB 11.3*  ?HCT 32.8*  ?PLT 236  ? ? ?Recent Labs  ?  12/15/21 ?1200  ?NA 137  ?K 3.9  ?CL 104  ?CO2 28  ?GLUCOSE 168*  ?BUN 15  ?CREATININE 0.91  ?CALCIUM 8.6*  ? ? ? ?Intake/Output Summary (Last 24 hours) at 12/16/2021 0746 ?Last data filed at 12/15/2021 1341 ?Gross per 24 hour  ?Intake 240 ml  ?Output --  ?Net 240 ml  ? ?  ? ?  ? ?Physical Exam: ?Vital Signs ?Blood pressure (!) 115/53, pulse 64, temperature 98.3 ?F (36.8 ?C), resp. rate 18, height 5\' 7"  (1.702 m), weight 81.5 kg, SpO2 93 %. ? ? ?General: Alert and oriented x 3, No apparent distress ?HEENT: Head is normocephalic, atraumatic, PERRLA, EOMI, sclera anicteric, oral mucosa pink and moist, dentition intact  ?Neck: Supple without JVD or lymphadenopathy ?Heart: Reg rate and rhythm.  ?Chest: CTA bilaterally without wheezes, rales, or rhonchi; no distress ?Abdomen: Soft, non-tender, non-distended, bowel sounds positive. ?Extremities: No clubbing, cyanosis, or edema. Pulses are 2+ ?Psych: Pt's affect is appropriate. Pt is cooperative ?Skin: warm and dry ?Neuro:  Alert and oriented. Follows commands.  CN 2-12 intact. Intact sensation in all 4 to LT. Moving all 4 extremities. ?Musculoskeletal: Right upper extremity splint. Hinged bledsoe knee brace RLE ?PIV RUE ? ? ?Assessment/Plan: ?1. Functional deficits which require 3+ hours per day of  interdisciplinary therapy in a comprehensive inpatient rehab setting. ?Physiatrist is providing close team supervision and 24 hour management of active medical problems listed below. ?Physiatrist and rehab team continue to assess barriers to discharge/monitor patient progress toward functional and medical goals ? ?Care Tool: ? ?Bathing ?   ?Body parts bathed by patient: Chest, Abdomen, Front perineal area, Right upper leg, Left upper leg, Right lower leg, Face, Right arm (R axilla only as arm in a cast)  ? Body parts bathed by helper: Left arm, Buttocks ?Body parts n/a: Left lower leg ?  ?Bathing assist Assist Level: Minimal Assistance - Patient > 75% ?  ?  ?Upper Body Dressing/Undressing ?Upper body dressing   ?What is the patient wearing?: Pull over shirt ?   ?Upper body assist Assist Level: Set up assist ?   ?Lower Body Dressing/Undressing ?Lower body dressing ? ? ?   ?What is the patient wearing?: Pants ? ?  ? ?Lower body assist Assist for lower body dressing: Moderate Assistance - Patient 50 - 74% ?   ? ?Toileting ?Toileting    ?Toileting assist Assist for toileting: Moderate Assistance - Patient 50 - 74% ?  ?  ?Transfers ?Chair/bed transfer ? ?Transfers assist ?   ? ?  Chair/bed transfer assist level: Minimal Assistance - Patient > 75% ?  ?  ?Locomotion ?Ambulation ? ? ?Ambulation assist ? ? Ambulation activity did not occur: Safety/medical concerns ? ?Assist level: Minimal Assistance - Patient > 75% ?Assistive device: Walker-hemi ?Max distance: 4'  ? ?Walk 10 feet activity ? ? ?Assist ? Walk 10 feet activity did not occur: Safety/medical concerns ? ?  ?   ? ?Walk 50 feet activity ? ? ?Assist Walk 50 feet with 2 turns activity did not occur: Safety/medical concerns ? ?  ?   ? ? ?Walk 150 feet activity ? ? ?Assist Walk 150 feet activity did not occur: Safety/medical concerns ? ?  ?  ?  ? ?Walk 10 feet on uneven surface  ?activity ? ? ?Assist Walk 10 feet on uneven surfaces activity did not occur: Safety/medical  concerns ? ? ?  ?   ? ?Wheelchair ? ? ? ? ?Assist Is the patient using a wheelchair?: Yes ?Type of Wheelchair: Manual ?  ? ?Wheelchair assist level: Supervision/Verbal cueing ?Max wheelchair distance: 150'  ? ? ?Wheelchair 50 feet with 2 turns activity ? ? ? ?Assist ? ?  ?  ? ? ?Assist Level: Supervision/Verbal cueing  ? ?Wheelchair 150 feet activity  ? ? ? ?Assist ?   ? ? ?Assist Level: Supervision/Verbal cueing  ? ?Blood pressure (!) 115/53, pulse 64, temperature 98.3 ?F (36.8 ?C), resp. rate 18, height 5\' 7"  (1.702 m), weight 81.5 kg, SpO2 93 %. ? ?Medical Problem List and Plan: ?1. Functional deficits secondary to fall with multiple fractures ?            -patient may shower but incisions must be covered.  ?            -ELOS/Goals: 7-9 days S ?          -Continue CIR therapies including PT, OT  ? 5/7 -Pt is limited by bilateral UE NWB. She indicates that there is not someone at home to help with mobility. I reviewed intra-articular left radial head fx. It is minimal/non-displaced.  ? 5/8 Ortho Dr. 7/8 reports pt may do any activity with LUE.   ?2.  Antithrombotics: ?-DVT/anticoagulation:  Pharmaceutical: Other (comment) aspirin 325 mg daily ?            -antiplatelet therapy: Aspirin 325 mg daily ?3. Postoperative pain: continue Tylenol, oxycodone as needed ? -5/7 pt reports pain is controlled ? -5/9 Reports medications are keeping pain under control ?4. Depression/anxiety: CSW to evaluate and provide support ?            -antipsychotic agents: n/a ?            -continue Lexapro ? -continue low-dose Xanax as needed for anxiety ?5. Neuropsych: This patient is capable of making decisions on her own behalf. ?6. Skin/Wound Care: Routine skin care checks ?            -- Monitor RUE surgical incision once splint is removed; has nylon sutures ? -Splint appears to be wrapped appropriately and intact.  ?7. Fluids/Electrolytes/Nutrition: Routine Is and Os and follow-up chemistries ?8. Right olecranon fracture s/p  ORIF ?            --NWB; splint in place, Nylon sutures ?9: Left tibial plateau fracture: touch down weight-bearing ?            -5/8 Ortho reports L Bledsoe leg brace to be locked in extension when ambulating, can be unlocked when in bed ?10: Left  radial head fracture:  ?-Ortho Dr. Steward DroneBokshan reports pt may do any activity with LUE.   ?11: DM2: continue SSI AC/HS and CBGs ? CBG (last 3)  ?Recent Labs  ?  12/15/21 ?1641 12/15/21 ?2112 12/16/21 ?0533  ?GLUCAP 167* 163* 151*  ?             5/7 improvement in cbg's yesterday ? -continueTradjenta in place of patient's Januvia that she uses at home ? -5/9 glucose continues to be stable, continue current treatment ?12: ABLA: improving ? -5/8 CBC 11.3, improved continue to monitor ?13: Constipation: is chronic;  Stools softeners not usually effective ?14: Hypoalbuminemia: continue protein supplements ?15: Tobacco use: cessation counseling; continue Nicoderm ?16: Hypertension: continue Ziac ? 5/8 BP overall well controlled, continue to monitor ?17: Hyperlipidemia: continue Zetia, Crestor ?18. Dysuria ? -U/A ordered ?  ? ?LOS: ?4 days ?A FACE TO FACE EVALUATION WAS PERFORMED ? ?Fanny DanceYuri Chandrika Sandles ?12/16/2021, 7:46 AM  ? ?  ?

## 2021-12-16 NOTE — Progress Notes (Signed)
Occupational Therapy Session Note ? ?Patient Details  ?Name: Alexandra Parsons ?MRN: [id number] ?Date of Birth: [birth year] ? ?Today's Date: 12/16/2021 ?OT Individual Time: 4709-6283 ?OT Individual Time Calculation (min): 68 min  ? ? ?Short Term Goals: ?Week 1:  OT Short Term Goal 1 (Week 1): Pt will complete toileting with supervision ?OT Short Term Goal 2 (Week 1): Pt will complete bathroom transfers with supervision while adhering to [facility] precautions ?OT Short Term Goal 3 (Week 1): PT will complete LB dressing with supervision ? ?Skilled Therapeutic Interventions/Progress Updates:  ?  Pt received in w/c drinking her coffee stating she was ready for a shower.  Her RN had removed the IV earlier.  Obtained bag to cover her R arm splint.   ?Pt tends to move quickly and has difficulty focusing at times (jumping her conversation from topic to topic) so she often needs cues to slow down, focus on 1 task at a time.   ?Pt taken into bathroom and complete stand pivot with grab bar to toilet and toileted. Returned to wc to then stand pivot to bench with hemiwalker.  Pt's blesdoe brace doffed and removed pants using lateral leans.  Bathed with long sponge and spent quite of time washing her hair.   Dried off from bench to donn clothing.  Placed pants up to upper thighs to redon Bledsoe brace and then pt stood with hemi walker to balance and then used L hand to pull pants up.  ?Cues with transfer to keep hemiwalker close to her body for leverage to lift up on to maintain TDWB on LLE. ? ?When pt pulls up on a bar with L forearm in pronation she has elbow pain at fracture site.  Very minimal discomfort when she pulls up with arm in supination. She is also having L thumb pain when pressing down on hemi walker. She may benefit from a thumb spica splint.   ? ?Pt resting in wc at end of session sitting at sink working on her hair.  Belt alarm on and all needs met. (Pt can move around room in w/c herself) ? ?Therapy  Documentation ?Precautions:  ?Precautions ?Precautions: Fall ?Required Braces or Orthoses: Other Brace ?Knee Immobilizer - Left: On when out of bed or walking ?Other Brace: bledose brace can be unlocked but on for support during ambulation ?Restrictions ?Weight Bearing Restrictions: Yes ?RUE Weight Bearing: Non weight bearing ?LUE Weight Bearing: Weight bearing as tolerated ?LLE Weight Bearing: Touchdown weight bearing ?Other Position/Activity Restrictions: "She can do anything with her left arm. She can platform, she can use crutches, any activity as tolerated." Per ortho MD 5.8 ?Therapy Vitals ?Temp: 98 ?F (36.7 ?C) ?Temp Source: Oral ?Pulse Rate: (!) 55 ?Resp: 16 ?BP: (!) 87/44 ?Patient Position (if appropriate): Lying ?Oxygen Therapy ?SpO2: 96 % ?O2 Device: Room Air ?Pain: ? No c/o pain  ?ADL: ?ADL ?Eating: Set up ?Where Assessed-Eating: Bed level ?Grooming: Setup ?Where Assessed-Grooming: Wheelchair ?Upper Body Bathing: Supervision/safety (using long sponge to reach under L arm) ?Where Assessed-Upper Body Bathing: Shower ?Lower Body Bathing: Minimal assistance ?Where Assessed-Lower Body Bathing: Shower ?Upper Body Dressing: Setup ?Where Assessed-Upper Body Dressing: Other (Comment) (tub bench) ?Lower Body Dressing: Moderate assistance ?Where Assessed-Lower Body Dressing: Other (Comment) (tub bench) ?Toileting: Moderate assistance ?Where Assessed-Toileting: Toilet ?Toilet Transfer: Minimal assistance ?Toilet Transfer Method: Stand pivot ?Toilet Transfer Equipment: Raised toilet seat ?Tub/Shower Transfer: Not assessed ?Walk-In Shower Transfer: Minimal assistance ?Walk-In Shower Transfer Method: Stand pivot ?Walk-In Shower Equipment: Radio broadcast assistant ?ADL Comments: used  hemiwalker for support ? ? ? ?Therapy/Group: Individual Therapy ? ?[place] ?12/16/2021, 2:02 PM ?

## 2021-12-17 DIAGNOSIS — M25532 Pain in left wrist: Secondary | ICD-10-CM

## 2021-12-17 DIAGNOSIS — R3915 Urgency of urination: Secondary | ICD-10-CM | POA: Diagnosis not present

## 2021-12-17 DIAGNOSIS — S82102D Unspecified fracture of upper end of left tibia, subsequent encounter for closed fracture with routine healing: Secondary | ICD-10-CM | POA: Diagnosis not present

## 2021-12-17 DIAGNOSIS — T07XXXA Unspecified multiple injuries, initial encounter: Secondary | ICD-10-CM | POA: Diagnosis not present

## 2021-12-17 DIAGNOSIS — E119 Type 2 diabetes mellitus without complications: Secondary | ICD-10-CM | POA: Diagnosis not present

## 2021-12-17 LAB — GLUCOSE, CAPILLARY
Glucose-Capillary: 145 mg/dL — ABNORMAL HIGH (ref 70–99)
Glucose-Capillary: 138 mg/dL — ABNORMAL HIGH (ref 70–99)
Glucose-Capillary: 170 mg/dL — ABNORMAL HIGH (ref 70–99)
Glucose-Capillary: 122 mg/dL — ABNORMAL HIGH (ref 70–99)

## 2021-12-17 NOTE — Progress Notes (Signed)
?                                                       PROGRESS NOTE ? ? ?Subjective/Complaints: ?Patient in bed in AM. She reports hx of Lcarpal tunnel syndrome in past. She has some discomfort in her L thumb. Denies dysuria today, mostly urinating frequently because she is drinking a lot of water.  ? ?Review of Systems  ?Constitutional:  Negative for chills and fever.  ?Respiratory:  Negative for shortness of breath.   ?Cardiovascular:  Negative for chest pain.  ?Gastrointestinal:  Negative for nausea and vomiting.  ?Genitourinary:  Positive for frequency. Negative for flank pain and hematuria.   ? ? ?Objective: ?  ?No results found. ?Recent Labs  ?  12/15/21 ?1200  ?WBC 9.2  ?HGB 11.3*  ?HCT 32.8*  ?PLT 236  ? ? ?Recent Labs  ?  12/15/21 ?1200  ?NA 137  ?K 3.9  ?CL 104  ?CO2 28  ?GLUCOSE 168*  ?BUN 15  ?CREATININE 0.91  ?CALCIUM 8.6*  ? ? ? ?Intake/Output Summary (Last 24 hours) at 12/17/2021 0801 ?Last data filed at 12/16/2021 1830 ?Gross per 24 hour  ?Intake 840 ml  ?Output --  ?Net 840 ml  ? ?  ? ?  ? ?Physical Exam: ?Vital Signs ?Blood pressure (!) 100/56, pulse (!) 58, temperature 98 ?F (36.7 ?C), resp. rate 18, height 5\' 7"  (1.702 m), weight 81.5 kg, SpO2 99 %. ? ? ?General: Alert and oriented x 3, No apparent distress ?HEENT: Head is normocephalic, atraumatic, PERRLA, EOMI, sclera anicteric, oral mucosa pink and moist, dentition intact  ?Neck: Supple without JVD or lymphadenopathy ?Heart: Reg rate and rhythm.  ?Chest: CTA bilaterally without wheezes, rales, or rhonchi; no distress ?Abdomen: Soft, non-tender, non-distended, bowel sounds positive. ?Extremities: No clubbing, cyanosis, or edema. Pulses are 2+ ?Psych: Pt's affect is appropriate. Pt is cooperative ?Skin: warm and dry ?Neuro:  Alert and oriented. Follows commands.  CN 2-12 intact. Intact sensation in all 4 to LT. Moving all 4 extremities. ?Musculoskeletal: Right upper extremity splint. Hinged bledsoe knee brace RLE ?PIV RUE ?Tinels signs neg R,  phalens positive R ?Sensation intact to all fingers of right hand ? ? ?Assessment/Plan: ?1. Functional deficits which require 3+ hours per day of interdisciplinary therapy in a comprehensive inpatient rehab setting. ?Physiatrist is providing close team supervision and 24 hour management of active medical problems listed below. ?Physiatrist and rehab team continue to assess barriers to discharge/monitor patient progress toward functional and medical goals ? ?Care Tool: ? ?Bathing ?   ?Body parts bathed by patient: Chest, Abdomen, Front perineal area, Right upper leg, Left upper leg, Right lower leg, Face, Right arm (R axilla only as arm in a cast)  ? Body parts bathed by helper: Left arm, Buttocks ?Body parts n/a: Left lower leg ?  ?Bathing assist Assist Level: Minimal Assistance - Patient > 75% ?  ?  ?Upper Body Dressing/Undressing ?Upper body dressing   ?What is the patient wearing?: Pull over shirt ?   ?Upper body assist Assist Level: Set up assist ?   ?Lower Body Dressing/Undressing ?Lower body dressing ? ? ?   ?What is the patient wearing?: Pants ? ?  ? ?Lower body assist Assist for lower body dressing: Moderate Assistance - Patient 50 - 74% ?   ? ?  Toileting ?Toileting    ?Toileting assist Assist for toileting: Moderate Assistance - Patient 50 - 74% ?  ?  ?Transfers ?Chair/bed transfer ? ?Transfers assist ?   ? ?Chair/bed transfer assist level: Minimal Assistance - Patient > 75% ?  ?  ?Locomotion ?Ambulation ? ? ?Ambulation assist ? ? Ambulation activity did not occur: Safety/medical concerns ? ?Assist level: Minimal Assistance - Patient > 75% ?Assistive device: Walker-hemi ?Max distance: 4'  ? ?Walk 10 feet activity ? ? ?Assist ? Walk 10 feet activity did not occur: Safety/medical concerns ? ?  ?   ? ?Walk 50 feet activity ? ? ?Assist Walk 50 feet with 2 turns activity did not occur: Safety/medical concerns ? ?  ?   ? ? ?Walk 150 feet activity ? ? ?Assist Walk 150 feet activity did not occur: Safety/medical  concerns ? ?  ?  ?  ? ?Walk 10 feet on uneven surface  ?activity ? ? ?Assist Walk 10 feet on uneven surfaces activity did not occur: Safety/medical concerns ? ? ?  ?   ? ?Wheelchair ? ? ? ? ?Assist Is the patient using a wheelchair?: Yes ?Type of Wheelchair: Manual ?  ? ?Wheelchair assist level: Supervision/Verbal cueing ?Max wheelchair distance: 150'  ? ? ?Wheelchair 50 feet with 2 turns activity ? ? ? ?Assist ? ?  ?  ? ? ?Assist Level: Supervision/Verbal cueing  ? ?Wheelchair 150 feet activity  ? ? ? ?Assist ?   ? ? ?Assist Level: Supervision/Verbal cueing  ? ?Blood pressure (!) 100/56, pulse (!) 58, temperature 98 ?F (36.7 ?C), resp. rate 18, height 5\' 7"  (1.702 m), weight 81.5 kg, SpO2 99 %. ? ?Medical Problem List and Plan: ?1. Functional deficits secondary to fall with multiple fractures ?            -patient may shower but incisions must be covered.  ?            -ELOS/Goals: 7-9 days S ?          -Continue CIR therapies including PT, OT  ? 5/7 -Pt is limited by bilateral UE NWB. She indicates that there is not someone at home to help with mobility. I reviewed intra-articular left radial head fx. It is minimal/non-displaced.  ? 5/8 Ortho Dr. 7/8 reports pt may do any activity with LUE.   ? 5/10 Team conference today ?2.  Antithrombotics: ?-DVT/anticoagulation:  Pharmaceutical: Other (comment) aspirin 325 mg daily ?            -antiplatelet therapy: Aspirin 325 mg daily ?3. Postoperative pain: continue Tylenol, oxycodone as needed ? -5/7 pt reports pain is controlled ? -5/9 Reports medications are keeping pain under control ?4. Depression/anxiety: CSW to evaluate and provide support ?            -antipsychotic agents: n/a ?            -continue Lexapro ? -continue low-dose Xanax as needed for anxiety ?5. Neuropsych: This patient is capable of making decisions on her own behalf. ?6. Skin/Wound Care: Routine skin care checks ?            -- Monitor RUE surgical incision once splint is removed; has nylon  sutures ? -Splint appears to be wrapped appropriately and intact.  ?7. Fluids/Electrolytes/Nutrition: Routine Is and Os and follow-up chemistries ?8. Right olecranon fracture s/p ORIF ?            --NWB; splint in place, Nylon sutures ?9: Left tibial plateau fracture:  touch down weight-bearing ?            -5/8 Ortho reports L Bledsoe leg brace to be locked in extension when ambulating, can be unlocked when in bed ?10: Left radial head fracture:  ?-Ortho Dr. Steward DroneBokshan reports pt may do any activity with LUE.   ?11: DM2: continue SSI AC/HS and CBGs ? CBG (last 3)  ?Recent Labs  ?  12/16/21 ?1648 12/16/21 ?2041 12/17/21 ?0532  ?GLUCAP 153* 161* 145*  ? ?             5/7 improvement in cbg's yesterday ? -continueTradjenta in place of patient's Januvia that she uses at home ? 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 She agrees to take this medication knowing its similar to [place]uvia ?12: ABLA: improving ? -5/8 CBC 11.3, improved continue to monitor ?13: Constipation: is chronic;  Stools softeners not usually effective ?14: Hypoalbuminemia: continue protein supplements ?15: Tobacco use: cessation counseling; continue Nicoderm ?16: Hypertension: continue Ziac ? 5/8 BP overall well controlled, continue to monitor ?17: Hyperlipidemia: continue Zetia, Crestor ?18. Urinary Urgency ?-U/A 5/9 with only small leukocytes, rare bacteria. Denies dysuria today just reports urinating frequently but drinking a lot of water. Will monitor and repeat U/A and culture if symptoms of UTI develop. ?19. Wrist pain and R thumb pain ? -patient reports hx of CTS in past ? -order wrist splint ?  ? ?LOS: ?5 days ?A FACE TO FACE EVALUATION WAS PERFORMED ? ?Fanny DanceYuri Roxsana Riding ?12/17/2021, 8:01 AM  ? ?  ?

## 2021-12-17 NOTE — Progress Notes (Signed)
Orthopedic Tech Progress Note ?Patient Details:  ?ROSIELEE CORPORAN ?1961/04/02 ?[id number] ? ?Called in order to [employer] for a WRIST SPLINT  ? ?Patient ID: LILLIE PORTNER, female   DOB: 12/23/1960, 61 y.o.   MRN: [id number] ? ?[name] ?12/17/2021, 3:25 PM ? ?

## 2021-12-17 NOTE — Patient Care Conference (Signed)
Inpatient RehabilitationTeam Conference and Plan of Care Update ?Date: 12/17/2021   Time: 12:21 PM  ? ? ?Patient Name: Alexandra Parsons      ?Medical Record Number: [id number]  ?Date of Birth: 1960/09/27 ?Sex: Female         ?Room/Bed: 2U63F/3L45G-25 ?Payor Info: Payor: [employer] / Plan: [employer] / Product Type: *No Product type* /   ? ?Admit Date/Time:  12/12/2021  3:22 PM ? ?Primary Diagnosis:  Critical polytrauma ? ?Hospital Problems: Principal Problem: ?  Critical polytrauma ? ? ? ?Expected Discharge Date: Expected Discharge Date: 12/26/21 ? ?Team Members Present: ?Physician leading conference: Dr. Fanny Dance ?Social Worker Present: Dossie Der, LCSW ?Nurse Present: Chana Bode, RN ?PT Present: Merry Lofty, PT ?OT Present: Roney Mans, OT ?PPS Coordinator present : Fae Pippin, SLP ? ?   Current Status/Progress Goal Weekly Team Focus  ?Bowel/Bladder ? ? (P) cont. of b/b LBM 5/6  (P) cont b/b      ?Swallow/Nutrition/ Hydration ? ?           ?ADL's ? ? min - mod A overall  mod I toileting and toilet transfer, dressing; supervision bathing and shower transfers  safety awareness with precautions, mobility, ADL adaptive techniques   ?Mobility ? ? min to mod assist for stand pivot transfers; limited by pain and fatigue and restrictions; supervision w/c mobility; initiating short distance gait with HW  mod I for basic transfers, w/c mobility and very short distance gait (to get into rooms/doorways); min assist car tx, stairs TBD pending WB status changes to RLE  transfers, w/c parts management, d/c planning and problem solving, functional strengthening, gait as able for short distance   ?Communication ? ?           ?Safety/Cognition/ Behavioral Observations ?           ?Pain ? ?   Oxy for pain q 4        ?Skin ? ?   Splint, bledsoe brace, dressing, etc   Skin care managed   Dressing changes per order  ? ? ?Discharge Planning:  ?Home with intermittent assist from daughter will be alone  at home and need to be safe to be. Hopefully ortho will increase WB when see's Friday   ?Team Discussion: ?Patient is limited by weight bearing restrictions, splint, cast, bledsoe brace locked in extension, etc. Pain is managed but unable to tolerate use of hemiwalker due to pain in left wrist.  ?Patient on target to meet rehab goals: ?Patient limited by restrictions, and limited mobility.  Needs min assist for stand pivot and has 3 steps to home.  ? ?*See Care Plan and progress notes for long and short-term goals.  ? ?Revisions to Treatment Plan:  ?Neuro psych consult for coping ?  ?Teaching Needs: ?Safety, skin care, restrictions, transfers, toileting/hygiene, medications, etc  ?Current Barriers to Discharge: ?Home enviroment access/layout, Lack of/limited family support, and Weight bearing restrictions ? ?Possible Resolutions to Barriers: ?Recommend ramp for entry; question if able to install due to environmental limits or use fold out ramp ?Family education ?DME: W/C, BSC, TTB,  ?HH follow up services ?  ? ? Medical Summary ?Current Status: fall with multiple fractures, Right olecranon fracture s/p ORIF, DM2, HTN ? Barriers to Discharge: Other (comments);Home enviroment access/layout;Decreased family/caregiver support;Weight bearing restrictions;Wound care ? Barriers to Discharge Comments: fall with multiple fractures, Right olecranon fracture s/p ORIF, DM2, HTN ?Possible Resolutions to [employer]: Continue use of LE brace, follow glucose and  adjust medications, follow BP, monitor labs ? ? ?Continued Need for Acute Rehabilitation Level of Care: The patient requires daily medical management by a physician with specialized training in physical medicine and rehabilitation for the following reasons: ?Direction of a multidisciplinary physical rehabilitation program to maximize functional independence : Yes ?Medical management of patient stability for increased activity during participation in an intensive  rehabilitation regime.: Yes ?Analysis of laboratory values and/or radiology reports with any subsequent need for medication adjustment and/or medical intervention. : Yes ? ? ?I attest that I was present, lead the team conference, and concur with the assessment and plan of the team. ? ? ?Dorien Chihuahua B ?12/17/2021, 4:18 PM  ? ? ? ? ? ? ?

## 2021-12-17 NOTE — Progress Notes (Signed)
Occupational Therapy Session Note ? ?Patient Details  ?Name: Alexandra Parsons ?MRN: [id number] ?Date of Birth: 1960-12-26 ? ?Today's Date: 12/17/2021 ?OT Individual Time: [id number] ?OT Individual Time Calculation (min): 70 min  ? ? ?Short Term Goals: ?Week 1:  OT Short Term Goal 1 (Week 1): Pt will complete toileting with supervision ?OT Short Term Goal 2 (Week 1): Pt will complete bathroom transfers with supervision while adhering to [facility] precautions ?OT Short Term Goal 3 (Week 1): PT will complete LB dressing with supervision ? ?Skilled Therapeutic Interventions/Progress Updates:  ?  Pt resting in w/c upon arrival. OT intervention with focus on w/c mobility, discharge planning, DME requirements, bed mobility, and safety awareness. W/c mobility with supervision for 100'+. Demonstrated TTB transfers but did not practice. Stand pivot transfers with CGA with pt maintaining WBing precautions. Sit<>supine in bed with CGA. Discussed home setup and educated on home safety. Pt will be d/c home alone. Pt returned to bed and remained in bed with RN present.  ? ?Therapy Documentation ?Precautions:  ?Precautions ?Precautions: Fall ?Required Braces or Orthoses: Other Brace ?Knee Immobilizer - Left: On when out of bed or walking ?Other Brace: bledose brace can be unlocked but on for support during ambulation ?Restrictions ?Weight Bearing Restrictions: Yes ?RUE Weight Bearing: Non weight bearing ?LUE Weight Bearing: Weight bearing as tolerated ?LLE Weight Bearing: Touchdown weight bearing ?Other Position/Activity Restrictions: "She can do anything with her left arm. She can platform, she can use crutches, any activity as tolerated." Per ortho MD 5.8 ? ?Pain: ?Pain Assessment ?Pain Score: 4  ?Rest and repositioned ? ? ? ?Therapy/Group: Individual Therapy ? ?Leroy Libman ?12/17/2021, 12:00 PM ?

## 2021-12-17 NOTE — Progress Notes (Signed)
Patient ID: Alexandra Parsons, female   DOB: 1960/12/05, 60 y.o.   MRN: [id number]  Met with pt to discuss team conference goals of supervision wheelchair level and target discharge date of 5/19. Discussed how much assist daughter can provide, pt reports she will transport her to appointments and assist with home management, but has a child and husband. Reached out to Workers [employer]-CM-Kim Kluba to update and inform her of team;s recommendations. She reports they will not be able to provide 24/7 care and would recommend for pt to go to a NH. Have sent clinicals to her and will get updated work note for pt since her last one expired. Will work on safe plan for pt.  ?

## 2021-12-17 NOTE — Evaluation (Addendum)
Recreational Therapy Assessment and Plan ? ?Patient Details  ?Name: Alexandra Parsons ?MRN: [id number] ?Date of Birth: Mar 17, 1961 ?Today's Date: 12/17/2021 ? ?Rehab Potential:  Good ?ELOS:   d/c 5/19 ? ?Assessment ? ?Hospital Problem: Principal Problem: ?  Critical polytrauma ?  ?  ?Past Medical History:  ?    ?Past Medical History:  ?Diagnosis Date  ? Abnormal Pap smear of cervix    ?  h/o HPV  ? Chronic pain disorder    ?  arthitis and fibromyalgia  ? Chronic sinusitis, chronic cough (?COPD) 07/16/2009  ? Depression    ? Diabetes mellitus    ?  type 2  ? Dyslipidemia    ? Elevated LFTs 11/19/2011  ? Hypertension    ? Leukocytosis 11/19/2011  ? Menopausal syndrome    ? Neck pain 05/20/2011  ? Sarcoid    ?  post partum in remission  ? Submandibular gland swelling    ?  Right  ?  ?Past Surgical History:  ?     ?Past Surgical History:  ?Procedure Laterality Date  ? abdominal plasty remotely      ? FINE NEEDLE ASPIRATION Left 12/09/2021  ?  Procedure: LEFT KNEE  NEEDLE ASPIRATION;  Surgeon: Vanetta Mulders, MD;  Location: [place];  Service: Orthopedics;  Laterality: Left;  ? NASAL SEPTUM SURGERY   12/26/2015  ? ORIF ELBOW FRACTURE Right 12/09/2021  ?  Procedure: OPEN REDUCTION INTERNAL FIXATION (ORIF) ELBOW/OLECRANON FRACTURE;  Surgeon: Vanetta Mulders, MD;  Location: [place];  Service: Orthopedics;  Laterality: Right;  ? pilondial cyst removal      ? TUBAL LIGATION      ? TURBINATE REDUCTION Bilateral 12/26/2015  ?  ?  ?Assessment & Plan ?Clinical Impression: Patient is a 61 y.o. year old female with bilateral elbow pain as well as left knee pain.  She had a fall on the day prior at which point she states that the left foot got caught underneath her and gave out.  She fell forward landing on both arms and left knee.  She subsequently has had difficulty moving bilateral arms and left knee.  She was initially able to place weight on the left knee although there is subsequently been significant swelling at which point she was no longer  able to move or place weight on this.  She was placed in a knee immobilizer on the left knee in the emergency room.  She is placed in a splint for her right elbow.  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 She does have a history of chronic pain for which she is on long-term narcotics for what she states is attributable to arthritis.  She is otherwise healthy and a Hydrographic surveyor.  She is here with her daughter. Admitted to [facility] on 12/07/21.  Underwent ORIF R olecranon fracture on 12/09/21 by Dr. Sammuel Hines along with closed management of left radial head fracture and aspiration of left knee.  PT/OT evaluations completed with recommendations for inpatient rehab admission. ?  ?Pt presents with decreased activity tolerance, decreased functional mobility, decreased balance, feelings of stress Limiting pt's independence with leisure/community pursuits. ? ?Met with pt today to discuss TR services including leisure education, activity analysis/modifications and stress management.  Also discussed the importance of social, emotional, spiritual health in addition to physical health and their effects on overall health and wellness.  Pt stated understanding. ? ? ?Plan ? Min 1 TR session >20 minutes per week during LOS ? ?Recommendations for  other services: None  ? ?Discharge Criteria: Patient will be discharged from TR if patient refuses treatment 3 consecutive times without medical reason.  If treatment goals not met, if there is a change in medical status, if patient makes no progress towards goals or if patient is discharged from hospital. ? ?The above assessment, treatment plan, treatment alternatives and goals were discussed and mutually agreed upon: by patient ? ?Izola Teague ?12/17/2021, 4:09 PM  ?

## 2021-12-17 NOTE — Progress Notes (Signed)
Physical Therapy Session Note ? ?Patient Details  ?Name: Alexandra Parsons ?MRN: [id number] ?Date of Birth: 1961/04/18 ? ?Today's Date: 12/17/2021 ?PT Individual Time: 6945-0388; 8280-0349 ?PT Individual Time Calculation (min): 57 min and 55 mins ? ?Short Term Goals: ?Week 1:  PT Short Term Goal 1 (Week 1): STG= LTG based on ELOS ? ?Skilled Therapeutic Interventions/Progress Updates:  ?  Session 1: Patient received sitting up in bed, agreeable to PT. She reports pain, did not rate, premedicated. PT providing rest breaks, distractions and repositioning to assist with pain management. She requires consistent and frequent verbal cues to maintain NWB to R UE and TTWB to L LE throughout all mobility. She was able to don pants with MinA in standing with MinA to come to stand from elevated mat height. Upon standing, patient requesting to use BSC. MinA stand pivot with Max cuing to maintain weight bearing weight bearing precautions. Continent void. Stand pivot to bed then wc with MinA. PT transporting patient in wc to therapy gym for time management and energy conservation. Patient with significant difficulty maintaining weight bearing precautions and reports increase in pain with functional weight bearing on L UE and any more than partial weight bearing on L LE (despite multiple cues from PT to maintain TTWB). PT and patient problem solving which AD would safest and most comfortable for patient to use. Trial hemi walker, loftstrand crutch and axillary crutch. Patient continuing to report increase in L radial side pain in forearm, distally. Patient voicing frustration with current weight bearing precautions and limitations in activity. PT providing emotional support. Patient returning to room in wc, needs within reach.  ? ? ?Session 2: Patient received in bed, agreeable to PT. She reports pain, did not rate, premedicated. PT providing rest breaks, distractions and repositioning to assist with pain management. She requested to use  the bathroom. Patient able to come sit edge of bed with supervision and verbal cues to maintain weight bearing precautions to RUE. MiNA stand pivot to [facility] with hat in place to collect urine sample. Continent void with supervision for perihygiene and clothing management in standing. Patient unable to maintain TTWB to L LE and tolerates ~10-15% of weight bearing through L LE per patient report. She continues to report increase in L thumb/wrist pain with any functional grip/use of it. Patient able to tolerate very minimal AROM to L knee outside bledsoe brace in NWB position. Patient completing x5 sit <> stand with HW and up to ModA to power up and maintain weightbearing precautions. Patient propelling herself in wc x184ft with supervision. Remaining up in chair, seatbelt alarm on, call light within reach.  ? ?Therapy Documentation ?Precautions:  ?Precautions ?Precautions: Fall ?Required Braces or Orthoses: Other Brace ?Knee Immobilizer - Left: On when out of bed or walking ?Other Brace: bledose brace can be unlocked but on for support during ambulation ?Restrictions ?Weight Bearing Restrictions: Yes ?RUE Weight Bearing: Non weight bearing ?LUE Weight Bearing: Weight bearing as tolerated ?LLE Weight Bearing: Touchdown weight bearing ?Other Position/Activity Restrictions: "She can do anything with her left arm. She can platform, she can use crutches, any activity as tolerated." Per ortho MD 5.8 ? ? ? ?Therapy/Group: Individual Therapy ? ?Westley Foots ?Westley Foots, PT, DPT, CBIS ? ?12/17/2021, 7:42 AM  ?

## 2021-12-17 NOTE — Progress Notes (Addendum)
Pt would like for Tradjenta to be removed from med list. 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  ? ?Alexandra Parsons  ?

## 2021-12-18 DIAGNOSIS — E119 Type 2 diabetes mellitus without complications: Secondary | ICD-10-CM

## 2021-12-18 DIAGNOSIS — S82102D Unspecified fracture of upper end of left tibia, subsequent encounter for closed fracture with routine healing: Secondary | ICD-10-CM

## 2021-12-18 DIAGNOSIS — M25532 Pain in left wrist: Secondary | ICD-10-CM

## 2021-12-18 DIAGNOSIS — T07XXXA Unspecified multiple injuries, initial encounter: Secondary | ICD-10-CM

## 2021-12-18 DIAGNOSIS — K5900 Constipation, unspecified: Secondary | ICD-10-CM

## 2021-12-18 LAB — GLUCOSE, CAPILLARY
Glucose-Capillary: 117 mg/dL — ABNORMAL HIGH (ref 70–99)
Glucose-Capillary: 131 mg/dL — ABNORMAL HIGH (ref 70–99)
Glucose-Capillary: 131 mg/dL — ABNORMAL HIGH (ref 70–99)
Glucose-Capillary: 114 mg/dL — ABNORMAL HIGH (ref 70–99)

## 2021-12-18 MED ORDER — POLYETHYLENE GLYCOL 3350 17 G PO PACK
17.0000 g | PACK | Freq: Every day | ORAL | Status: DC
  Administered 2021-12-19 – 2021-12-26 (×8): 17 g via ORAL
  Filled 2021-12-18 (×8): qty 1

## 2021-12-18 NOTE — Progress Notes (Signed)
Occupational Therapy Session Note ? ?Patient Details  ?Name: Alexandra Parsons ?MRN: [id number] ?Date of Birth: Oct 09, 1960 ? ?Today's Date: 12/18/2021 ?OT Individual Time: 1100-1145 ?OT Individual Time Calculation (min): 45 min  ? ? ?Short Term Goals: ?Week 1:  OT Short Term Goal 1 (Week 1): Pt will complete toileting with supervision ?OT Short Term Goal 2 (Week 1): Pt will complete bathroom transfers with supervision while adhering to [facility] precautions ?OT Short Term Goal 3 (Week 1): PT will complete LB dressing with supervision ? ?Skilled Therapeutic Interventions/Progress Updates:  ?  Patient received up in wheelchair.  Patient awake and alert.  Session focused on increasing independence with basic self care skills and managing various precautions/restrictions.  Patient reporting discomfort "inside" her left leg, but reports she has taken her medication.  Initially not wanting to sponge bathe, get dressed, but with encouragement, willing to practice.  Patient needed consistent cueing to maintain TDWB LLE, stating, "it's impossible."  Patient not resistive to cueing, but appears to discount the importance of adhering to restriction.  Patient better with managing RUE NWB during ADL.  Patient takes rest breaks as needed, and transitions well from sit to stand, just with cueing each time as preface to transition to maintain TDWB.  Patient has a Sports administrator that a friend brought her - practice in using this for LB dressing may be helpful to increase independence.   ?Patient left up in wheelchair with alarm engaged, and call bell, personal items in reach.   ? ? ?Therapy Documentation ?Precautions:  ?Precautions ?Precautions: Fall ?Required Braces or Orthoses: Other Brace ?Knee Immobilizer - Left: On when out of bed or walking ?Other Brace: bledose brace can be unlocked but on for support during ambulation ?Restrictions ?Weight Bearing Restrictions: Yes ?RUE Weight Bearing: Non weight bearing ?LUE Weight Bearing: Weight bearing  as tolerated ?LLE Weight Bearing: Touchdown weight bearing ?Other Position/Activity Restrictions: "She can do anything with her left arm. She can platform, she can use crutches, any activity as tolerated." Per ortho MD 5.8 ? ? ?Pain: ?Pain Assessment ?Pain Scale: 0-10 ?Pain Score: 5 - left leg (knee/shin) ? ? ? ?Therapy/Group: Individual Therapy ? ?Collier Salina ?12/18/2021, 12:25 PM ?

## 2021-12-18 NOTE — Progress Notes (Signed)
Physical Therapy Session Note ? ?Patient Details  ?Name: Alexandra Parsons ?MRN: [id number] ?Date of Birth: [birth year] ? ?Today's Date: 12/18/2021 ?PT Individual Time: 6213-0865 ?PT Individual Time Calculation (min): 45 min  ? ?Short Term Goals: ?Week 1:  PT Short Term Goal 1 (Week 1): STG= LTG based on ELOS ? ?Skilled Therapeutic Interventions/Progress Updates:  ?  Pt received attempting to get out of bed and calling for assistance due to urgent need to have a BM. Supine to sit with Supervision with HOB elevated and use of bedrail. Sit to stand and stand pivot transfer to w/c then to elevated BSC over toilet with HW and min A. Reminded pt of WB precautions with LLE, pt does not adhere to precautions during transfer and states "they are dreaming" with regards to being able to adhere to precautions with transfers and mobility. Pt able to continently void while seated on toilet. Pt is setup A for pericare, assist needed for clothing management. Manual w/c propulsion 2 x 100 ft with use of LUE and RLE at Supervision level. Sit to stand and stand pivot transfers with use of HW and min A with cues for sequencing and adherence to WB precautions. Pt left seated in w/c in room with needs in reach, quick release belt and chair alarm in place at end of session. ? ?Therapy Documentation ?Precautions:  ?Precautions ?Precautions: Fall ?Required Braces or Orthoses: Other Brace ?Knee Immobilizer - Left: On when out of bed or walking ?Other Brace: bledose brace can be unlocked but on for support during ambulation ?Restrictions ?Weight Bearing Restrictions: Yes ?RUE Weight Bearing: Non weight bearing ?LUE Weight Bearing: Weight bearing as tolerated ?LLE Weight Bearing: Touchdown weight bearing ?Other Position/Activity Restrictions: "She can do anything with her left arm. She can platform, she can use crutches, any activity as tolerated." Per ortho MD 5.8 ? ? ? ? ? ?Therapy/Group: Individual Therapy ? ? ?Peter Congo, PT, DPT,  CSRS ?12/18/2021, 12:05 PM  ?

## 2021-12-18 NOTE — Progress Notes (Signed)
Occupational Therapy Session Note ? ?Patient Details  ?Name: Alexandra Parsons ?MRN: [id number] ?Date of Birth: 01/02/1961 ? ?Today's Date: 12/18/2021 ?OT Individual Time: 1100-1145 ?OT Individual Time Calculation (min): 45 min  ? ? ?Short Term Goals: ?Week 1:  OT Short Term Goal 1 (Week 1): Pt will complete toileting with supervision ?OT Short Term Goal 2 (Week 1): Pt will complete bathroom transfers with supervision while adhering to [facility] precautions ?OT Short Term Goal 3 (Week 1): PT will complete LB dressing with supervision ? ?Skilled Therapeutic Interventions/Progress Updates:  ?  Pt sitting up in w/c, reports pain in left wrist and thumb on dorsal aspect.  She reports pain is greater when gripping left hemi walker. 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  Pt declined numbness or tingling in bilateral hands.  No gross atrophy noted along median nerve distribution. Pt reports she received a prefabricated wrist cockup splint yesterday and she tried it and it doesn't seem to help her current pain and does not want to wear it.  Assessed left wrist and thumb and noted + Finkelstein's test. Due to current weight bearing restrictions and need for independence during functional transfers upon returning home, fabricating custom wrist and thumb opponens may be too restrictive with use of left hemiwalker, therefore recommending soft wrist and thumb wrap to be worn full time to reduce strain and pain left thumb while allowing some movement to facilitate functional transfers. Made MD aware of this therapists recommendations. Pt reports she would like to rest remainder of session and politely declines self care during this session. ? ?Therapy Documentation ?Precautions:  ?Precautions ?Precautions: Fall ?Required Braces or Orthoses: Other Brace ?Knee Immobilizer - Left: On when out of bed or walking ?Other Brace: bledose brace can be unlocked but  on for support during ambulation ?Restrictions ?Weight Bearing Restrictions: Yes ?RUE Weight Bearing: Non weight bearing ?LUE Weight Bearing: Weight bearing as tolerated ?LLE Weight Bearing: Touchdown weight bearing ?Other Position/Activity Restrictions: "She can do anything with her left arm. She can platform, she can use crutches, any activity as tolerated." Per ortho MD 5.8 ? ? ? ?Therapy/Group: Individual Therapy ? ?Caryl Asp Kaelin Bonelli ?12/18/2021, 12:26 PM ?

## 2021-12-18 NOTE — Progress Notes (Signed)
?                                                       PROGRESS NOTE ? ? ?Subjective/Complaints: ?[facility] No new complaints or concerns. Having some pain in her left thumb during therapy.  ? ?Review of Systems  ?Constitutional:  Negative for chills and fever.  ?Respiratory:  Negative for shortness of breath.   ?Cardiovascular:  Negative for chest pain.  ?Gastrointestinal:  Positive for constipation. Negative for nausea and vomiting.  ?Genitourinary:  Negative for flank pain and hematuria.  ?Musculoskeletal:  Negative for back pain and neck pain.   ? ? ?Objective: ?  ?No results found. ?Recent Labs  ?  12/15/21 ?1200  ?WBC 9.2  ?HGB 11.3*  ?HCT 32.8*  ?PLT 236  ? ? ?Recent Labs  ?  12/15/21 ?1200  ?NA 137  ?K 3.9  ?CL 104  ?CO2 28  ?GLUCOSE 168*  ?BUN 15  ?CREATININE 0.91  ?CALCIUM 8.6*  ? ? ? ?Intake/Output Summary (Last 24 hours) at 12/18/2021 0832 ?Last data filed at 12/17/2021 1808 ?Gross per 24 hour  ?Intake 840 ml  ?Output 350 ml  ?Net 490 ml  ? ?  ? ?  ? ?Physical Exam: ?Vital Signs ?Blood pressure (!) 133/54, pulse 60, temperature 98.7 ?F (37.1 ?C), temperature source Oral, resp. rate 18, height 5\' 7"  (1.702 m), weight 81.5 kg, SpO2 99 %. ? ? ?General: Alert and oriented x 3, No apparent distress ?HEENT: Head is normocephalic, atraumatic, PERRLA, EOMI, sclera anicteric, oral mucosa pink and moist, dentition intact  ?Neck: Supple without JVD or lymphadenopathy ?Heart: Reg rate and rhythm.  ?Chest: CTA bilaterally without wheezes, rales, or rhonchi; no distress ?Abdomen: Soft, non-tender, non-distended, bowel sounds positive. ?Extremities: No clubbing, cyanosis, or edema. Pulses are 2+ ?Psych: Pt's affect is appropriate. Pt is cooperative ?Skin: warm and dry ?Neuro:  Alert and oriented. Follows commands.  CN 2-12 intact. Intact sensation in all 4 to LT. Moving all 4 extremities. ?Musculoskeletal: Right upper extremity splint. Hinged bledsoe knee brace RLE ?Sensation intact to all fingers of right  hand ?No significant tenderness to wrist palpation, no pain with [facility] movement/loading ? ? ?Assessment/Plan: ?1. Functional deficits which require 3+ hours per day of interdisciplinary therapy in a comprehensive inpatient rehab setting. ?Physiatrist is providing close team supervision and 24 hour management of active medical problems listed below. ?Physiatrist and rehab team continue to assess barriers to discharge/monitor patient progress toward functional and medical goals ? ?Care Tool: ? ?Bathing ?   ?Body parts bathed by patient: Chest, Abdomen, Front perineal area, Right upper leg, Left upper leg, Right lower leg, Face, Right arm (R axilla only as arm in a cast)  ? Body parts bathed by helper: Left arm, Buttocks ?Body parts n/a: Left lower leg ?  ?Bathing assist Assist Level: Minimal Assistance - Patient > 75% ?  ?  ?Upper Body Dressing/Undressing ?Upper body dressing   ?What is the patient wearing?: Pull over shirt ?   ?Upper body assist Assist Level: Set up assist ?   ?Lower Body Dressing/Undressing ?Lower body dressing ? ? ?   ?What is the patient wearing?: Pants ? ?  ? ?Lower body assist Assist for lower body dressing: Moderate Assistance - Patient 50 - 74% ?   ? ?  Toileting ?Toileting    ?Toileting assist Assist for toileting: Moderate Assistance - Patient 50 - 74% ?  ?  ?Transfers ?Chair/bed transfer ? ?Transfers assist ?   ? ?Chair/bed transfer assist level: Minimal Assistance - Patient > 75% ?  ?  ?Locomotion ?Ambulation ? ? ?Ambulation assist ? ? Ambulation activity did not occur: Safety/medical concerns ? ?Assist level: Minimal Assistance - Patient > 75% ?Assistive device: Walker-hemi ?Max distance: 4'  ? ?Walk 10 feet activity ? ? ?Assist ? Walk 10 feet activity did not occur: Safety/medical concerns ? ?  ?   ? ?Walk 50 feet activity ? ? ?Assist Walk 50 feet with 2 turns activity did not occur: Safety/medical concerns ? ?  ?   ? ? ?Walk 150 feet activity ? ? ?Assist Walk 150 feet activity did not  occur: Safety/medical concerns ? ?  ?  ?  ? ?Walk 10 feet on uneven surface  ?activity ? ? ?Assist Walk 10 feet on uneven surfaces activity did not occur: Safety/medical concerns ? ? ?  ?   ? ?Wheelchair ? ? ? ? ?Assist Is the patient using a wheelchair?: Yes ?Type of Wheelchair: Manual ?  ? ?Wheelchair assist level: Supervision/Verbal cueing ?Max wheelchair distance: 150'  ? ? ?Wheelchair 50 feet with 2 turns activity ? ? ? ?Assist ? ?  ?  ? ? ?Assist Level: Supervision/Verbal cueing  ? ?Wheelchair 150 feet activity  ? ? ? ?Assist ?   ? ? ?Assist Level: Supervision/Verbal cueing  ? ?Blood pressure (!) 133/54, pulse 60, temperature 98.7 ?F (37.1 ?C), temperature source Oral, resp. rate 18, height 5\' 7"  (1.702 m), weight 81.5 kg, SpO2 99 %. ? ?Medical Problem List and Plan: ?1. Functional deficits secondary to fall with multiple fractures ?            -patient may shower but incisions must be covered.  ?            -ELOS/Goals: 7-9 days S ?          -Continue CIR therapies including PT, OT  ? 5/7 -Pt is limited by bilateral UE NWB. She indicates that there is not someone at home to help with mobility. I reviewed intra-articular left radial head fx. It is minimal/non-displaced.  ? 5/8 Ortho Dr. 7/8 reports pt may do any activity with LUE.   ? 5/11 Team conference yesterday, DC target 5/19, working on safe discharge plan as she doesn't have 24/7 care at home ?2.  Antithrombotics: ?-DVT/anticoagulation:  Pharmaceutical: Other (comment) aspirin 325 mg daily ?            -antiplatelet therapy: Aspirin 325 mg daily ?3. Postoperative pain: continue Tylenol, oxycodone as needed ? -5/7 pt reports pain is controlled ? -5/9 Reports medications are keeping pain under control ?4. Depression/anxiety: CSW to evaluate and provide support ?            -antipsychotic agents: n/a ?            -continue Lexapro ? -continue low-dose Xanax as needed for anxiety ? -Reports mood is good overall 5/11 ?5. Neuropsych: This patient is  capable of making decisions on her own behalf. ?6. Skin/Wound Care: Routine skin care checks ?            -- Monitor RUE surgical incision once splint is removed; has nylon sutures ? -Splint appears to be wrapped appropriately and intact.  ?7. Fluids/Electrolytes/Nutrition: Routine Is and Os and follow-up chemistries ?8. Right olecranon fracture  s/p ORIF ?            --NWB; splint in place, Nylon sutures ?9: Left tibial plateau fracture: touch down weight-bearing ?            -5/8 Ortho reports L Bledsoe leg brace to be locked in extension when ambulating, can be unlocked when in bed ?10: Left radial head fracture:  ?-Ortho Dr. Steward Drone reports pt may do any activity with LUE.   ?11: DM2: continue SSI AC/HS and CBGs ? CBG (last 3)  ?Recent Labs  ?  12/17/21 ?1629 12/17/21 ?2117 12/18/21 ?5361  ?GLUCAP 170* 122* 117*  ? ?             5/7 improvement in cbg's yesterday ? -continueTradjenta in place of patient's Januvia that she uses at home ? 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 She agrees to take this medication knowing its similar to [place] ?5/11 Glucose control improved today, she did not take tradjenta, agrees to take ?12: ABLA: improving ? -5/8 CBC 11.3, improved continue to monitor ?13: Constipation: is chronic;  Stools softeners not usually effective ? -5/11 schedule miralax ?14: Hypoalbuminemia: continue protein supplements ?15: Tobacco use: cessation counseling; continue Nicoderm ?16: Hypertension: continue Ziac ? 5/8 BP overall well controlled, continue to monitor ?17: Hyperlipidemia: continue Zetia, Crestor ?18. Urinary Urgency ?-U/A 5/9 with only small leukocytes, rare bacteria. Denies dysuria today just reports urinating frequently but drinking a lot of water. Will monitor and repeat U/A and culture if symptoms of UTI  develop. ?19. Wrist pain and L thumb pain ? -OT to try soft wrist and thumb wrap ? -Consider voltaren gel if not improved ?  ? ?LOS: ?6 days ?A FACE TO FACE EVALUATION WAS PERFORMED ? ?Fanny Dance ?12/18/2021

## 2021-12-19 ENCOUNTER — Inpatient Hospital Stay (HOSPITAL_COMMUNITY): Payer: BC Managed Care – PPO

## 2021-12-19 ENCOUNTER — Other Ambulatory Visit (HOSPITAL_BASED_OUTPATIENT_CLINIC_OR_DEPARTMENT_OTHER): Payer: Self-pay

## 2021-12-19 DIAGNOSIS — N390 Urinary tract infection, site not specified: Secondary | ICD-10-CM

## 2021-12-19 DIAGNOSIS — R0789 Other chest pain: Secondary | ICD-10-CM

## 2021-12-19 LAB — URINE CULTURE: Culture: >=100000 — AB

## 2021-12-19 LAB — GLUCOSE, CAPILLARY
Glucose-Capillary: 145 mg/dL — ABNORMAL HIGH (ref 70–99)
Glucose-Capillary: 172 mg/dL — ABNORMAL HIGH (ref 70–99)
Glucose-Capillary: 150 mg/dL — ABNORMAL HIGH (ref 70–99)
Glucose-Capillary: 199 mg/dL — ABNORMAL HIGH (ref 70–99)

## 2021-12-19 MED ORDER — CEPHALEXIN 250 MG PO CAPS
500.0000 mg | ORAL_CAPSULE | Freq: Four times a day (QID) | ORAL | Status: AC
  Administered 2021-12-19 – 2021-12-26 (×28): 500 mg via ORAL
  Filled 2021-12-19 (×28): qty 2

## 2021-12-19 MED ORDER — HYDROCHLOROTHIAZIDE 12.5 MG PO TABS
6.2500 mg | ORAL_TABLET | Freq: Every day | ORAL | Status: DC
  Administered 2021-12-20: 6.25 mg via ORAL
  Filled 2021-12-19 (×2): qty 1

## 2021-12-19 MED ORDER — BISOPROLOL-HYDROCHLOROTHIAZIDE 2.5-6.25 MG PO TABS: 1.0000 | ORAL_TABLET | Freq: Every day | ORAL | Status: DC

## 2021-12-19 MED ORDER — LIDOCAINE 4 % EX CREA
TOPICAL_CREAM | Freq: Three times a day (TID) | CUTANEOUS | Status: DC | PRN
  Filled 2021-12-19: qty 5

## 2021-12-19 MED ORDER — LIDOCAINE 5 % EX PTCH
1.0000 | MEDICATED_PATCH | CUTANEOUS | Status: DC
  Administered 2021-12-19 – 2021-12-26 (×8): 1 via TRANSDERMAL
  Filled 2021-12-19 (×8): qty 1

## 2021-12-19 MED ORDER — BISOPROLOL FUMARATE 5 MG PO TABS
2.5000 mg | ORAL_TABLET | Freq: Every day | ORAL | Status: DC
  Administered 2021-12-20: 2.5 mg via ORAL
  Filled 2021-12-19 (×2): qty 0.5

## 2021-12-19 NOTE — Plan of Care (Signed)
?  Problem: RH Dressing ?Goal: LTG Patient will perform lower body dressing w/assist (OT) ?Description: LTG: Patient will perform lower body dressing with assist, with/without cues in positioning using equipment (OT) ?Flowsheets (Taken 12/19/2021 1153) ?LTG: Pt will perform lower body dressing with assistance level of: (LTG downgraded due to safety concerns with pt using R arm for weight bearing.) Supervision/Verbal cueing ?Note: LTG downgraded due to safety concerns with pt using R arm for weight bearing. ?  ?Problem: RH Simple Meal Prep ?Goal: LTG Patient will perform simple meal prep w/assist (OT) ?Description: LTG: Patient will perform simple meal prep with assistance, with/without cues (OT). ?Flowsheets (Taken 12/19/2021 1153) ?LTG: Pt will perform simple meal prep with assistance level of: (LTG discontinued as pt will be going to SNF placement.) -- ?Note: LTG discontinued as pt will be going to SNF placement. ?  ?Problem: RH Toilet Transfers ?Goal: LTG Patient will perform toilet transfers w/assist (OT) ?Description: LTG: Patient will perform toilet transfers with assist, with/without cues using equipment (OT) ?Flowsheets (Taken 12/19/2021 1153) ?LTG: Pt will perform toilet transfers with assistance level of: (LTG downgraded due to safety concerns with pt using R arm for weight bearing.) Supervision/Verbal cueing ?Note: LTG downgraded due to safety concerns with pt using R arm for weight bearing. ?  ?Problem: RH Tub/Shower Transfers ?Goal: LTG Patient will perform tub/shower transfers w/assist (OT) ?Description: LTG: Patient will perform tub/shower transfers with assist, with/without cues using equipment (OT) ?Flowsheets (Taken 12/19/2021 1153) ?LTG: Pt will perform tub/shower stall transfers with assistance level of: (LTG downgraded due to safety concerns with pt using R arm for weight bearing.) Minimal Assistance - Patient > 75% ?LTG: Pt will perform tub/shower transfers from: Walk in shower ?Note: LTG downgraded  due to safety concerns with pt using R arm for weight bearing. ?  ?

## 2021-12-19 NOTE — Progress Notes (Signed)
Physical Therapy Session Note ? ?Patient Details  ?Name: Alexandra Parsons ?MRN: [id number] ?Date of Birth: [birth year] ? ?Today's Date: 12/19/2021 ?PT Individual Time: 1829-9371 ?PT Individual Time Calculation (min): 45 min  ? ?Short Term Goals: ?Week 1:  PT Short Term Goal 1 (Week 1): STG= LTG based on ELOS ? ?Skilled Therapeutic Interventions/Progress Updates:  ?  Patient received sitting up in wc, agreeable to PT. She reports 10/10 pain in affected extremities, premedicated. PT providing rest breaks, distractions and repositioning to assist with pain management. At the time of this PT session, no clarifying weight bearing orders in place since R soft splint was removed. Patient freely moving R UE and attempting to put weight through it through hand and through elbow- both causing increase in pain. PT educating patient on maintaining NWB and elbow at 90* (as it previously was) until told otherwise. Per patient, MD told her "to just not bring her hand to her mouth." PT transporting patient in wc to therapy gym for time management and energy conservation. She was able to come to stand with HW and MinA attempting to maintain TTWB to L LE. Patient becoming increasingly frustrated that she can't use R UE, but she admits that using the R UE causes pain. She was able to ambulate 5 steps forward using HW and MinA fluctuating between TTWB and partial weight bearing to L LE. Patient returning to room in wc. Patient taking phone call from Idelle Jo CM. Patient returning to bed via stand pivot with MinA. Bed alarm on, call light within reach.  ? ?Therapy Documentation ?Precautions:  ?Precautions ?Precautions: Fall ?Required Braces or Orthoses: Other Brace ?Knee Immobilizer - Left: On when out of bed or walking ?Other Brace: bledose brace can be unlocked but on for support during ambulation ?Restrictions ?Weight Bearing Restrictions: Yes ?RUE Weight Bearing: Non weight bearing ?LUE Weight Bearing: Weight bearing as tolerated ?LLE  Weight Bearing: Touchdown weight bearing ?Other Position/Activity Restrictions: "She can do anything with her left arm. She can platform, she can use crutches, any activity as tolerated." Per ortho MD 5.8 ? ? ? ? ?Therapy/Group: Individual Therapy ? ?Westley Foots ?Westley Foots, PT, DPT, CBIS ? ?12/19/2021, 7:38 AM  ?

## 2021-12-19 NOTE — Progress Notes (Signed)
? ?  Subjective: ?Having pain this morning and anxious about multiple sites of pain.  She has been working with physical therapy daily in acute rehab. ? ? ?Objective:  ? ?VITALS:   ?Vitals:  ? 12/18/21 1321 12/18/21 2006 12/19/21 0529 12/19/21 0740  ?BP: (!) 105/56 140/90 (!) 119/47 (!) 128/54  ?Pulse: 61 (!) 57 (!) 52 (!) 57  ?Resp: 17 18 16    ?Temp: 98.4 ?F (36.9 ?C) 98.9 ?F (37.2 ?C) 98.4 ?F (36.9 ?C)   ?TempSrc: Oral Oral Oral   ?SpO2: 96% 100% 94%   ?Weight:      ?Height:      ? ? ? ? ?Lab Results  ?Component Value Date  ? WBC 9.2 12/15/2021  ? HGB 11.3 (L) 12/15/2021  ? HCT 32.8 (L) 12/15/2021  ? MCV 97.3 12/15/2021  ? PLT 236 12/15/2021  ? ? ?Right elbow splint removed, incision well-healed sutures removed.  No erythema or drainage.  SILT all distributions of right hand, +AIN/IO/PIN, fingers WWP  2+ radial pulse. ?  ?Left elbow she has range of motion from 0-125.   She has full pro supination.  2+ radial pulse.  Sensation is intact distally.  Tender about the radial head. ?  ?Left knee with improved effusion.  In hinged knee brace, fitted well. ROM is from 0-90. Able to fire EHL as well as tibialis anterior and gastrocsoleus.  2+ dorsalis pedis pulse. Tenderness about proximal tibia ?  ? ? ?Assessment/Plan: ? ?Status post open reduction internal fixation of right olecranon also with a left nondisplaced tibial plateau fracture 12/09/21 ? ?- Patient to work with PT/OT to optimize mobilization safely  ?- DVT ppx - SCDs, ambulation, aspirin 325 daily ?-At this time she may progress her weightbearing to as tolerated on the left leg with the brace locked in extension with weightbearing to assist with ambulation.  It may otherwise be unlocked. ?-I have ordered a right hinged elbow brace for range of motion between 30 and 110.  She should not use the right elbow for any type of platform weightbearing. ? ?02/08/22 ?12/19/2021, 10:40 AM ? ?

## 2021-12-19 NOTE — Progress Notes (Signed)
Physical Therapy Session Note ? ?Patient Details  ?Name: Alexandra Parsons ?MRN: [id number] ?Date of Birth: 1961-02-19 ? ?Today's Date: 12/19/2021 ?PT Individual Time: 7096-2836 ?PT Individual Time Calculation (min): 58 min  ? ?Short Term Goals: ?Week 1:  PT Short Term Goal 1 (Week 1): STG= LTG based on ELOS ? ?Skilled Therapeutic Interventions/Progress Updates:  ?Patient supine in bed on entrance to room. Patient alert and agreeable to PT session. Pt's WB status in LLE has been upgraded to WBAT with the brace locked in extension with weightbearing to assist with ambulation.  It may otherwise be unlocked. Pt now has R hinged elbow brace locked for range of motion between 30 and 110.  She should not use the RUE for any type of weightbearing. (Clarification through [employer] with surgeon.) ? ?Patient with no pain complaint throughout session in BLE, however is feeling pain in L lower arm that increases when in pronation and WB.  ? ?Therapeutic Activity: ?Bed Mobility: Patient performed supine --> sit to pt's R side with min/ Mod A for UB to upright seated position d/t inability to push up from RUE. VC provided for effort/ technique. Return to supine with CGA d/t fatigue from ambulation.  ?Transfers: Patient performed sit<>stand and stand pivot transfers throughout session with close supervision. Provided verbal cues fortechnique. ? ?Gait Training:  ?Patient ambulated 67' x1 using HW  with CGA then close supervision. Demonstrated goo step-to advancement with HW. Initially attempts to use HW holding in front with L forearm pronated. Increased painwith WB into LUE into HW. Pt relates that underhanded grip or forearm supination does not hurt. Turn HW to side so that pt's forearm and body positioning improves with minimal (bearable) pain in LUE. Provided vc/ tc for positioning, technique. At end of session, pt guided in ambulation with no AD and is able to produce step-to and some step-through gait pattern for 25' with CGA.   ? ?Patient supine  in bed at end of session with brakes locked, bed alarm set, and all needs within reach. Pt very happy with session and feels as though she "has her life back" following ability to walk.  ? ? ?Therapy Documentation ?Precautions:  ?Precautions ?Precautions: Fall ?Required Braces or Orthoses: Other Brace ?Knee Immobilizer - Left: On when out of bed or walking ?Other Brace: bledose brace can be unlocked but on for support during ambulation ?Restrictions ?Weight Bearing Restrictions: Yes ?RUE Weight Bearing: Non weight bearing ?LUE Weight Bearing: Weight bearing as tolerated ?LLE Weight Bearing: Touchdown weight bearing ?Other Position/Activity Restrictions: "She can do anything with her left arm. She can platform, she can use crutches, any activity as tolerated." Per ortho MD 5.8 ?General: ?  ?Vital Signs: ?Therapy Vitals ?Temp: 98.4 ?F (36.9 ?C) ?Temp Source: Oral ?Pulse Rate: (!) 52 ?Resp: 16 ?BP: (!) 119/47 ?Patient Position (if appropriate): Lying ?Oxygen Therapy ?SpO2: 94 % ?O2 Device: Room Air ?Pain: ?Minimal pain complaint with pt able to manage with repositioning of LUE.  ? ?Therapy/Group: Individual Therapy ? ?Loel Dubonnet PT, DPT ?12/19/2021, 5:40 AM  ?

## 2021-12-19 NOTE — Progress Notes (Signed)
Orthopedic Tech Progress Note ?Patient Details:  ?Alexandra Parsons ?03-21-1961 ?[id number] ? ?Patient ID: Alexandra Parsons, female   DOB: 10/06/1960, 61 y.o.   MRN: [id number] ? ?Alexandra Parsons ?12/19/2021, 10:00 AMCalled [employer] for right Hinged elbow brace. ? ?

## 2021-12-19 NOTE — [place] FL2 (Addendum)
?[place] MEDICAID FL2 LEVEL OF CARE SCREENING TOOL  ?  ? ?IDENTIFICATION  ?Patient Name: ?Alexandra Parsons Birthdate: [birth year] Sex: female Admission Date (Current Location): ?12/12/2021  ?[place] and [place] Number: ? [place] ?  Facility and Address:  ?[facility], [place] [address], [place],  [address] ?     Provider Number: ?[id number]  ?Attending Physician Name and Address:  ?Jennye Boroughs, MD ? Relative Name and Phone Number:  ?Rachel-daughter [phone] ?   ?Current Level of Care: ?Other (Comment) (Rehab) Recommended Level of Care: ?[place] Prior Approval Number: ?  ? ?Date Approved/Denied: ?  PASRR Number: ?[id number] ? ?Discharge Plan: ?SNF ?  ? ?Current Diagnoses: ?Patient Active Problem List  ? Diagnosis Date Noted  ? Critical polytrauma 12/12/2021  ? Recurrent left knee instability   ? Fracture of ulnar shaft, closed 12/08/2021  ? Chronic pain disorder 12/08/2021  ? Fracture of radial head, left, closed 12/08/2021  ? Knee effusion, left 12/08/2021  ? Fall at home, initial encounter 12/08/2021  ? Frequent falls 12/08/2021  ? DNR (do not resuscitate) 12/08/2021  ? Vitamin D deficiency 10/16/2021  ? Fibromyalgia 05/09/2021  ? Insomnia 10/28/2020  ? Tobacco abuse 06/08/2019  ? COPD- per CT 09-2018 11/26/2018  ? PCP NOTES >>>>>>>>>>>>>>>>>>>>> 12/30/2015  ? Leukocytosis 11/19/2011  ? Elevated LFTs 11/19/2011  ? Annual physical exam 09/18/2011  ? Neck pain-- chronic--UDS 05/20/2011  ? Chronic sinusitis, chronic cough (?COPD) 07/16/2009  ? ADHD 02/08/2008  ? POSITIVE PPD 02/08/2008  ? MENOPAUSAL SYNDROME 10/25/2007  ? 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 High cholesterol 02/10/2007  ? Anxiety and depression 02/10/2007  ? HTN (hypertension) 02/10/2007  ? ? ?Orientation RESPIRATION BLADDER Height & Weight   ?  ?Self, Time, Situation, Place ? Normal Continent Weight: 179 lb 11.5 oz (81.5 kg) ?Height:  5\' 7"  (170.2 cm)  ?BEHAVIORAL SYMPTOMS/MOOD  NEUROLOGICAL BOWEL NUTRITION STATUS  ?    Continent Diet (carb Modified thin liquids)  ?AMBULATORY STATUS COMMUNICATION OF NEEDS Skin   ?Extensive Assist Verbally Surgical wounds ?  ?  ?  ?    ?     ?     ? ? ?Personal Care Assistance Level of Assistance  ?Bathing, Dressing Bathing Assistance: Limited assistance ?  ?Dressing Assistance: Limited assistance ?   ? ?Functional Limitations Info  ?Sight Sight Info: Impaired (wears glasses) ?  ?   ? ? ?SPECIAL CARE FACTORS FREQUENCY  ?PT (By licensed PT), OT (By licensed OT)   ?  ?PT Frequency: 5x week ?OT Frequency: 5x week ?  ?  ?  ?   ? ? ?Contractures Contractures Info: Not present  ? ? ?Additional Factors Info  ?Code Status, Allergies Code Status Info: Full Dode ?Allergies Info: Benadryl, Erthromycin, Fluoxetine, Venlafaxaine ?  ?  ?  ?   ? ?Current Medications (12/19/2021):  This is the current hospital active medication list ?Current Facility-Administered Medications  ?Medication Dose Route Frequency Provider Last Rate Last Admin  ? acetaminophen (TYLENOL) tablet 325-650 mg  325-650 mg Oral Q4H PRN Barbie Banner, PA-C   650 mg at 12/19/21 [id number]  ? ALPRAZolam Duanne Moron) tablet 0.25 mg  0.25 mg Oral BID PRN Meredith Staggers, MD   0.25 mg at 12/13/21 [id number]  ? alum & mag hydroxide-simeth (MAALOX/MYLANTA) 200-200-20 MG/5ML suspension 30 mL  30 mL Oral Q4H PRN Barbie Banner, PA-C   30 mL at 12/16/21 1402  ? aspirin tablet 325 mg  325 mg Oral Daily Barbie Banner, [place]   325 mg at 12/19/21 [id number]  ? bisoprolol-hydrochlorothiazide (ZIAC) 2.5-6.25 MG per tablet 1 tablet  1 tablet Oral Daily Barbie Banner, PA-C   1 tablet at 12/19/21 [id number]  ? cephALEXin (KEFLEX) capsule 500 mg  500 mg Oral Q6H Jennye Boroughs, MD      ? docusate sodium (COLACE) capsule 100 mg  100 mg Oral BID Barbie Banner, PA-C   100 mg at 12/19/21 [id number]  ? escitalopram (LEXAPRO) tablet 20 mg  20 mg Oral Daily Barbie Banner, PA-C   20 mg at 12/19/21 [id number]  ? ezetimibe (ZETIA) tablet 10 mg  10 mg  Oral Daily Barbie Banner, PA-C   10 mg at 12/19/21 [id number]  ? guaiFENesin-dextromethorphan (ROBITUSSIN DM) 100-10 MG/5ML syrup 5-10 mL  5-10 mL Oral Q6H PRN Setzer, [address], PA-C      ? insulin aspart (novoLOG) injection 0-15 Units  0-15 Units Subcutaneous TID WC Barbie Banner, PA-C   2 Units at 12/19/21 0740  ? insulin aspart (novoLOG) injection 0-5 Units  0-5 Units Subcutaneous QHS Barbie Banner, PA-C   2 Units at 12/14/21 2059  ? lidocaine (LIDODERM) 5 % 1 patch  1 patch Transdermal Q24H Jennye Boroughs, MD      ? lidocaine (LMX) 4 % cream   Topical TID PRN Jennye Boroughs, MD      ? linagliptin (TRADJENTA) tablet 5 mg  5 mg Oral Daily Meredith Staggers, MD   5 mg at 12/19/21 0736  ? methocarbamol (ROBAXIN) tablet 500 mg  500 mg Oral Q6H PRN Barbie Banner, PA-C   500 mg at 12/14/21 1609  ? nicotine (NICODERM CQ - dosed in mg/24 hours) patch 21 mg  21 mg Transdermal Daily Barbie Banner, PA-C   21 mg at 12/19/21 [id number]  ? oxyCODONE (Oxy IR/ROXICODONE) immediate release tablet 10-15 mg  10-15 mg Oral Q4H PRN Barbie Banner, PA-C   15 mg at 12/19/21 [id number]  ? polyethylene glycol (MIRALAX / GLYCOLAX) packet 17 g  17 g Oral Daily Jennye Boroughs, MD   17 g at 12/19/21 0736  ? prochlorperazine (COMPAZINE) tablet 5-10 mg  5-10 mg Oral Q6H PRN Barbie Banner, PA-C      ? Or  ? prochlorperazine (COMPAZINE) injection 5-10 mg  5-10 mg Intramuscular Q6H PRN Vaughan Basta [address], PA-C      ? Or  ? prochlorperazine (COMPAZINE) suppository 12.5 mg  12.5 mg Rectal Q6H PRN Vaughan Basta, [address], PA-C      ? rosuvastatin (CRESTOR) tablet 10 mg  10 mg Oral Q M,W,F Barbie Banner, PA-C   10 mg at 12/17/21 1133  ? sodium phosphate (FLEET) 7-19 GM/118ML enema 1 enema  1 enema Rectal Once PRN Barbie Banner, PA-C      ? sorbitol 70 % solution 30 mL  30 mL Oral Daily PRN Barbie Banner, PA-C   30 mL at 12/17/21 2238  ? traZODone (DESYREL) tablet 25-50 mg  25-50 mg Oral QHS PRN Barbie Banner, PA-C   50 mg at 12/17/21  0057  ? ? ? ?Discharge Medications: ?Please see discharge summary for a list of discharge medications. ? ?Relevant Imaging Results: ? ?Relevant Lab Results: ? ? ?Additional Information ?SSN: [id number] Is a Workers Civil engineer, contracting [place] [phone] ? ?Elease Hashimoto, LCSW ? ? ? ? ?

## 2021-12-19 NOTE — Progress Notes (Addendum)
Patient ID: Alexandra Parsons, female   DOB: 03-01-1961, 61 y.o.   MRN: [id number] Pt will require care at discharge and she reports her daughter can not provide assist-24/7. Spoke with Sharen Counter [employer] CM and order sent to her. Awaiting her to let worker know contracts with facilities and can begin to look for a SNF for her. Pt is agreeable to this plan ? ?2:52 PM Pt doing well in PT session ambulating down the hallway with her hemi walker. Kim-Case Manager who reports the SNF has been approved will need to find a bed and facility that will accept Workers Comp ?

## 2021-12-19 NOTE — Progress Notes (Signed)
Occupational Therapy Weekly Progress Note ? ?Patient Details  ?Name: Alexandra Parsons ?MRN: [id number] ?Date of Birth: [birth year] ? ?Beginning of progress report period: Dec 13, 2021 ?End of progress report period: Dec 19, 2021 ? ?Today's Date: 12/19/2021 ?OT Individual Time: 3291-9166 ?OT Individual Time Calculation (min): 60 min  ? ? ?Patient has met 0 of 3 short term goals.  Pt's STGs were set at S level and pt continues to need min A. ? ?Patient continues to demonstrate the following deficits: muscle weakness and muscle joint tightness, decreased memory, and decreased standing balance, decreased balance strategies, and difficulty maintaining precautions and therefore will continue to benefit from skilled OT intervention to enhance overall performance with BADL. ? ?Patient not progressing toward long term goals.  See goal revision..  Plan of care revisions: Mod I goals downgraded to supervision as pt will need cuing to not WB through R arm. . ? ?OT Short Term Goals ?Week 1:  OT Short Term Goal 1 (Week 1): Pt will complete toileting with supervision ?OT Short Term Goal 1 - Progress (Week 1): Progressing toward goal ?OT Short Term Goal 2 (Week 1): Pt will complete bathroom transfers with supervision while adhering to [facility] precautions ?OT Short Term Goal 2 - Progress (Week 1): Progressing toward goal ?OT Short Term Goal 3 (Week 1): PT will complete LB dressing with supervision ?OT Short Term Goal 3 - Progress (Week 1): Progressing toward goal ?Week 2:  OT Short Term Goal 1 (Week 2): STGs = LTGs ? ?Skilled Therapeutic Interventions/Progress Updates:  ?  Pt received in bed with c/o pain. She had received her pain medications.  Pt initially not wanting to get out of bed due to discomfort but agreeable to trying to sit on BSC. Pt able to sit to EOB with Supervision.  Used hemiwalker in L hand in a modified way due to wrist and forearm and thumb pain.  She states she is not putting too much weight on her LLE but she is  clearly putting more weight than TDWB.  She states it is too difficulty to fully adhere to this precaution. With sit to stand she tries to use R arm so place sling on her arm as a reminder that RUE is NWB.  (Cast removed by ortho prior this am but as of the time of the session, no ortho note yet of any precaution changes). Pt could not recall what exactly ortho told her a few hours ago, just that she could not touch her nose with her R hand.  ?Overall CGA with transfers. Pt stood several times putting more weight on LLE to cleanse and don clothing over hips. Used reacher to don pants over LLE.   ?Pt transferred to wc.  Resting in wc with all needs met.  ? ?Therapy Documentation ?Precautions:  ?Precautions ?Precautions: Fall ?Required Braces or Orthoses: Other Brace ?Knee Immobilizer - Left: On when out of bed or walking ?Other Brace: bledose brace can be unlocked but on for support during ambulation ?Restrictions ?Weight Bearing Restrictions: Yes ?RUE Weight Bearing: Non weight bearing ?LUE Weight Bearing: Weight bearing as tolerated ?LLE Weight Bearing: Touchdown weight bearing ?Other Position/Activity Restrictions: "She can do anything with her left arm. She can platform, she can use crutches, any activity as tolerated." Per ortho MD 5.8 ? ?  ?Vital Signs: ?Therapy Vitals ?Temp: 98.4 ?F (36.9 ?C) ?Temp Source: Oral ?Pulse Rate: (!) 57 ?Resp: 16 ?BP: (!) 128/54 ?Patient Position (if appropriate): Lying ?Oxygen Therapy ?SpO2: 94 % ?  O2 Device: Room Air ?Pain: ?Pt initially stated she was in too much pain to move, but was able to transfer and stand during session with only c/o L forearm pain with WB ?ADL: ?ADL ?Eating: Set up ?Where Assessed-Eating: Bed level ?Grooming: Setup ?Where Assessed-Grooming: Wheelchair ?Upper Body Bathing: Supervision/safety (using long sponge to reach under L arm) ?Where Assessed-Upper Body Bathing: Shower ?Lower Body Bathing: Minimal assistance ?Where Assessed-Lower Body Bathing:  Shower ?Upper Body Dressing: Setup ?Where Assessed-Upper Body Dressing: Other (Comment) (tub bench) ?Lower Body Dressing: Moderate assistance ?Where Assessed-Lower Body Dressing: Other (Comment) (tub bench) ?Toileting: Moderate assistance ?Where Assessed-Toileting: Toilet ?Toilet Transfer: Minimal assistance ?Toilet Transfer Method: Stand pivot ?Toilet Transfer Equipment: Raised toilet seat ?Tub/Shower Transfer: Not assessed ?Walk-In Shower Transfer: Minimal assistance ?Walk-In Shower Transfer Method: Stand pivot ?Walk-In Shower Equipment: Radio broadcast assistant ?ADL Comments: used hemiwalker for support ? ? ?Therapy/Group: Individual Therapy ? ?Lyman ?12/19/2021, 8:49 AM  ?

## 2021-12-19 NOTE — Progress Notes (Signed)
?                                                       PROGRESS NOTE ? ? ?Subjective/Complaints: ?Pt reports bladder urgency. This is chronic problem but has worsened. Having some continued pain in her left thumb during therapy.  She also is having L chest wall pain since her injury. Surgery removed the cast off the RUE.  ? ?Review of Systems  ?Constitutional:  Negative for chills and fever.  ?Respiratory:  Negative for shortness of breath.   ?Cardiovascular:  Negative for chest pain.  ?Gastrointestinal:  Positive for constipation. Negative for nausea and vomiting.  ?Genitourinary:  Positive for frequency and urgency. Negative for dysuria, flank pain and hematuria.  ?Musculoskeletal:  Positive for joint pain. Negative for back pain and neck pain.   ? ? ?Objective: ?  ?No results found. ?No results for input(s): WBC, HGB, HCT, PLT in the last 72 hours. ? ?No results for input(s): NA, K, CL, CO2, GLUCOSE, BUN, CREATININE, CALCIUM in the last 72 hours. ? ? ?Intake/Output Summary (Last 24 hours) at 12/19/2021 0709 ?Last data filed at 12/18/2021 1810 ?Gross per 24 hour  ?Intake 780 ml  ?Output --  ?Net 780 ml  ? ?  ? ?  ? ?Physical Exam: ?Vital Signs ?Blood pressure (!) 119/47, pulse (!) 52, temperature 98.4 ?F (36.9 ?C), temperature source Oral, resp. rate 16, height 5\' 7"  (1.702 m), weight 81.5 kg, SpO2 94 %. ? ? ?General: Alert and oriented x 3, No apparent distress ?HEENT: Head is normocephalic, atraumatic, PERRLA, EOMI, sclera anicteric, oral mucosa pink and moist, dentition intact  ?Neck: Supple without JVD or lymphadenopathy ?Heart: Reg rate and rhythm.  ?Chest: CTA bilaterally without wheezes, rales, or rhonchi; no distress ?Abdomen: Soft, non-tender, non-distended, bowel sounds positive. ?Extremities: No clubbing, cyanosis, or edema. Pulses are 2+ ?Psych: Pt's affect is appropriate. Pt is cooperative ?Skin: warm and dry ?Neuro:  Alert and oriented. Follows commands.  CN 2-12 intact. Intact sensation in all 4 to  LT. Moving all 4 extremities. ?Musculoskeletal: Right upper extremity wrapped with ace. Hinged bledsoe knee brace RLE ?Sensation intact to all fingers of right hand ?No significant tenderness to L wrist palpation, no pain with 1st CMC movement/loading. Some pain with palpation CMC joint. Finkelstein equivocal.  ?Mild L chest wall tenderness ? ? ?Assessment/Plan: ?1. Functional deficits which require 3+ hours per day of interdisciplinary therapy in a comprehensive inpatient rehab setting. ?Physiatrist is providing close team supervision and 24 hour management of active medical problems listed below. ?Physiatrist and rehab team continue to assess barriers to discharge/monitor patient progress toward functional and medical goals ? ?Care Tool: ? ?Bathing ?   ?Body parts bathed by patient: Chest, Abdomen, Front perineal area, Buttocks, Right upper leg, Left upper leg, Right lower leg, Face  ? Body parts bathed by helper: Left arm ?Body parts n/a: Left lower leg ?  ?Bathing assist Assist Level: Minimal Assistance - Patient > 75% ?  ?  ?Upper Body Dressing/Undressing ?Upper body dressing   ?What is the patient wearing?: Pull over shirt, Bra ?   ?Upper body assist Assist Level: Minimal Assistance - Patient > 75% ?   ?Lower Body Dressing/Undressing ?Lower body dressing ? ? ?   ?What is the patient wearing?: Incontinence brief, Pants ? ?  ? ?  Lower body assist Assist for lower body dressing: Moderate Assistance - Patient 50 - 74% ?   ? ?Toileting ?Toileting    ?Toileting assist Assist for toileting: Moderate Assistance - Patient 50 - 74% ?  ?  ?Transfers ?Chair/bed transfer ? ?Transfers assist ?   ? ?Chair/bed transfer assist level: Minimal Assistance - Patient > 75% ?  ?  ?Locomotion ?Ambulation ? ? ?Ambulation assist ? ? Ambulation activity did not occur: Safety/medical concerns ? ?Assist level: Minimal Assistance - Patient > 75% ?Assistive device: Walker-hemi ?Max distance: 4'  ? ?Walk 10 feet activity ? ? ?Assist ? Walk 10  feet activity did not occur: Safety/medical concerns ? ?  ?   ? ?Walk 50 feet activity ? ? ?Assist Walk 50 feet with 2 turns activity did not occur: Safety/medical concerns ? ?  ?   ? ? ?Walk 150 feet activity ? ? ?Assist Walk 150 feet activity did not occur: Safety/medical concerns ? ?  ?  ?  ? ?Walk 10 feet on uneven surface  ?activity ? ? ?Assist Walk 10 feet on uneven surfaces activity did not occur: Safety/medical concerns ? ? ?  ?   ? ?Wheelchair ? ? ? ? ?Assist Is the patient using a wheelchair?: Yes ?Type of Wheelchair: Manual ?  ? ?Wheelchair assist level: Supervision/Verbal cueing ?Max wheelchair distance: 150'  ? ? ?Wheelchair 50 feet with 2 turns activity ? ? ? ?Assist ? ?  ?  ? ? ?Assist Level: Supervision/Verbal cueing  ? ?Wheelchair 150 feet activity  ? ? ? ?Assist ?   ? ? ?Assist Level: Supervision/Verbal cueing  ? ?Blood pressure (!) 119/47, pulse (!) 52, temperature 98.4 ?F (36.9 ?C), temperature source Oral, resp. rate 16, height 5\' 7"  (1.702 m), weight 81.5 kg, SpO2 94 %. ? ?Medical Problem List and Plan: ?1. Functional deficits secondary to fall with multiple fractures ?            -patient may shower but incisions must be covered.  ?            -ELOS/Goals: 5/19 ?          -Continue CIR therapies including PT, OT  ? 5/7 -Pt is limited by bilateral UE NWB. She indicates that there is not someone at home to help with mobility. I reviewed intra-articular left radial head fx. It is minimal/non-displaced.  ? 5/8 Ortho Dr. Sammuel Hines reports pt may do any activity with LUE.   ? 5/11 TDC target 5/19, working on safe discharge plan as she doesn't have 24/7 care at home ?2.  Antithrombotics: ?-DVT/anticoagulation:  Pharmaceutical: Other (comment) aspirin 325 mg daily ?            -antiplatelet therapy: Aspirin 325 mg daily ?3. Postoperative pain: continue Tylenol, oxycodone as needed ? -5/7 pt reports pain is controlled ? -5/12 Using oxycodone PRN , ?4. Depression/anxiety: CSW to evaluate and provide  support ?            -antipsychotic agents: n/a ?            -continue Lexapro ? -continue low-dose Xanax as needed for anxiety ? -Reports mood is good overall 5/11 ?5. Neuropsych: This patient is capable of making decisions on her own behalf. ?6. Skin/Wound Care: Routine skin care checks ?            -- Monitor RUE surgical incision once splint is removed; has nylon sutures ? -Splint appears to be wrapped appropriately and intact.  ?7.  Fluids/Electrolytes/Nutrition: Routine Is and Os and follow-up chemistries ?8. Right olecranon fracture s/p ORIF ?            --NWB; splint in place, Nylon sutures ?9: Left tibial plateau fracture: touch down weight-bearing ?            -5/8 Ortho reports L Bledsoe leg brace to be locked in extension when ambulating, can be unlocked when in bed ?10: Left radial head fracture:  ?-Ortho Dr. Sammuel Hines reports pt may do any activity with LUE.   ?11: DM2: continue SSI AC/HS and CBGs ? CBG (last 3)  ?Recent Labs  ?  12/18/21 ?1640 12/18/21 ?2132 12/19/21 ?0532  ?GLUCAP 131* 114* 145*  ? ?             5/7 improvement in cbg's yesterday ? -continueTradjenta in place of patient's Januvia that she uses at home ? 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 Discussed this is in similar class DPP-4 as [place]. She agrees to take this medication knowing its similar to [place] ?5/11 Glucose control improved today, she did not take tradjenta, agrees to take ?12: ABLA: improving ? -5/8 CBC 11.3, improved continue to monitor ?13: Constipation: is chronic;  Stools softeners not usually effective ? -5/11 schedule miralax ? -5/12 BM yesterday, continue current treatment ?14: Hypoalbuminemia: continue protein supplements ?15: Tobacco use: cessation counseling; continue Nicoderm ?16: Hypertension: continue Ziac ? 5/8 BP overall well controlled, continue to monitor ?17: Hyperlipidemia:  continue Zetia, Crestor ?18. UTI ?-U/A 5/9 with only small leukocytes, rare bacteria. Denies dysuria today just reports urinating  ?frequently but drinking a lot of water. Will monitor and repeat U/A and cu

## 2021-12-19 NOTE — Progress Notes (Signed)
Recreational Therapy Session Note ? ?Patient Details  ?Name: Alexandra Parsons ?MRN: [id number] ?Date of Birth: 23-Sep-1960 ?Today's Date: 12/19/2021 ? ?Pain: c/o pain,unrated, states she has had pain all morning, premedicated ?Skilled Therapeutic Interventions/Progress Updates: Attempted session today but pt with c/o pain from previous therapies and removal of splint by ortho MD.  Briefly discussed relaxation strategies and encouraged pt to use music as she previously stated that it was therapeutic for her. ? ? ?Kammi Hechler ?12/19/2021, 12:20 PM  ?

## 2021-12-20 LAB — GLUCOSE, CAPILLARY
Glucose-Capillary: 138 mg/dL — ABNORMAL HIGH (ref 70–99)
Glucose-Capillary: 127 mg/dL — ABNORMAL HIGH (ref 70–99)
Glucose-Capillary: 192 mg/dL — ABNORMAL HIGH (ref 70–99)
Glucose-Capillary: 170 mg/dL — ABNORMAL HIGH (ref 70–99)

## 2021-12-20 NOTE — Progress Notes (Signed)
Occupational Therapy Session Note ? ?Patient Details  ?Name: Alexandra Parsons ?MRN: [id number] ?Date of Birth: 23-Nov-1960 ? ?Today's Date: 12/20/2021 ?OT Individual Time: 9767-3419 and [id number] ?OT Individual Time Calculation (min): 65 min and 30 min ? ? ?Short Term Goals: ?Week 1:  OT Short Term Goal 1 (Week 1): Pt will complete toileting with supervision ?OT Short Term Goal 1 - Progress (Week 1): Progressing toward goal ?OT Short Term Goal 2 (Week 1): Pt will complete bathroom transfers with supervision while adhering to [facility] precautions ?OT Short Term Goal 2 - Progress (Week 1): Progressing toward goal ?OT Short Term Goal 3 (Week 1): PT will complete LB dressing with supervision ?OT Short Term Goal 3 - Progress (Week 1): Progressing toward goal ?Week 2:  OT Short Term Goal 1 (Week 2): STGs = LTGs ? ?Skilled Therapeutic Interventions/Progress Updates:  ?  Visit 1:  ?Pain: no c/o pain during therapy ?Pt originally scheduled for OT at 8am but upon arrival to her room, pt declined therapy stating her schedule said she did not have therapy until 10am (pt given the wrong schedule) and now a friend was visiting whom she wanted to spend time with.   Due to another cancellation, was able to return to pt at 11am.  ? ?Pt agreeable to a shower.  Pt ambulated with bledsoe brace locked in extension to bathroom with CGA/min A.  Used bathroom with S and then transferred into shower. Covered R arm brace with plastic bags. Pt doffed L leg brace for shower.  ? ?Pt sat on tub bench and bathed self with S using long sponge.  She continues to try to move too fast and multitask, making her less safe. For example, tried to stand to rinse bottom when still had soap on face and in eyes!  Pt stood quickly with a post LOB prevented by bench and therapist.  Emphasized strongly that she needs to slow down and do 1 task at a time!  ?Pt agreed. " I know I have some bad habits" ? ?Pt dried off well and redonned leg brace to ambulate to EOB to  dress.  Donned clothing with supervision. (Assist for briefs). ? ?Discussed home bathroom set up and that we will need to practice tub transfers the next session.  ? ?Pt resting in bed with all needs met to prepare for lunch. Alarm set. ? ?Visit 2:  ?Pain: no c/o pain ?Pt scheduled for 2nd session at 1300 but her lunch had not arrived at 1200 so they reordered her lunch and she was waiting for it. She did not want to leave the room at this time.  ?Returned at 1505 to resume therapy.  Pt agreeable to going to tub room to practice tub transfers as her home tub is also facing that direction where she has to enter to her R side.  ?Pt transferred OOB to wc with quad cane with CGA.  Taken to tub room.  ?She has glass doors that will be removed. Discussed placing towels over frame runner to protect heels as she transfers in.  Pt transferred out of chair, ambulated into BR with brace locked in ext with CGA to sit on tub bench with Supervision and then lifted both legs over tub wall.  Frequent cues to not try to push or pull with R arm.  Showed pt how to place curtain to to avoid water spillage.  Discussed getting in tub and then removing brace, using the same methods that we did this  morning.  ?Pt transferred back to wc and then taken back to room. She then transferred back to bed. Pt is much more independent with her mobility now that she is WBAT on LLE.  She is tolerating WB quite well. ? ?Pt in bed with all needs met and alarm set.  ? ? ?Therapy Documentation ?Precautions:  ?Precautions ?Precautions: Fall ?Required Braces or Orthoses: Other Brace ?Knee Immobilizer - Left: On when out of bed or walking (bledsoe (locked in exten for gait); unlocked otherwise) ?Other Brace: Bledsoe for RUE locked 0-30 degrees ?Restrictions ?Weight Bearing Restrictions: Yes ?RUE Weight Bearing: Non weight bearing ?LUE Weight Bearing: Weight bearing as tolerated ?LLE Weight Bearing: Weight bearing as tolerated ?Other Position/Activity  Restrictions: "She can do anything with her left arm. She can platform, she can use crutches, any activity as tolerated." Per ortho MD 5.8 ? ?Pain: ?Pain Assessment ?Pain Scale: 0-10 ?Pain Score: 7  ?Pain Type: Surgical pain ?Pain Location: Generalized ?Pain Descriptors / Indicators: Aching;Discomfort ?Pain Frequency: Constant ?Pain Onset: On-going ?ADL: ?ADL ?Eating: Set up ?Where Assessed-Eating: Bed level ?Grooming: Setup ?Where Assessed-Grooming: Wheelchair ?Upper Body Bathing: Supervision/safety (using long sponge to reach under L arm) ?Where Assessed-Upper Body Bathing: Shower ?Lower Body Bathing: Supervision/safety ?Where Assessed-Lower Body Bathing: Shower ?Upper Body Dressing: Setup ?Where Assessed-Upper Body Dressing: Edge of bed ?Lower Body Dressing: Supervision/safety ?Where Assessed-Lower Body Dressing: Edge of bed ?Toileting: Supervision/safety ?Where Assessed-Toileting: Toilet ?Toilet Transfer: Minimal assistance ?Toilet Transfer Method: Ambulating ?Science writer: Grab bars ?Tub/Shower Transfer: Not assessed ?Walk-In Shower Transfer: Minimal assistance ?Walk-In Shower Transfer Method: Ambulating ?Walk-In Shower Equipment: Radio broadcast assistant ?ADL Comments: used quad cane ? ?Therapy/Group: Individual Therapy ? ?[place] ?12/20/2021, 12:53 PM ?

## 2021-12-20 NOTE — Progress Notes (Signed)
Physical Therapy Session Note ? ?Patient Details  ?Name: Alexandra Parsons ?MRN: [id number] ?Date of Birth: 10/19/1960 ? ?Today's Date: 12/20/2021 ?PT Individual Time: 8466-5993 ?PT Individual Time Calculation (min): 60 min  ? ?Short Term Goals: ?Week 1:  PT Short Term Goal 1 (Week 1): STG= LTG based on ELOS ? ? ?Skilled Therapeutic Interventions/Progress Updates:  ?  Pt presents in the bed agreeable to session. Pt donned Bledsoe brace independently at bed level for LLE. Performed basic transfers in room initially with [facility] and progressed to gait in hallway with HW x 50' with overall CGA. Discussed progressing away from [facility] due to pain in L wrist and trialled [facility] and NBQC in the gym x 65' each trial with overall CGA and then x 120'. Pt prefers NBQC at this time and will continue to practice with this. Initiated stair negotiation for home entry practice trial with L rail, then progressed to using NBQC in situation of no rails available. Pt reports her stairs in garage do not have rails but can stabilize on shelf on the L which we simulated. She was also able to perform safely with the NBQC. Performed 4 steps x 3 reps between sets with CGA for balance, cues for sequencing. Pt managing leg rests independently during transfers from w/c and overall making excellent progress with mobility. End of session returned back to bed with CGA.  ? ?Therapy Documentation ?Precautions:  ?Precautions ?Precautions: Fall ?Required Braces or Orthoses: Other Brace ?Knee Immobilizer - Left: On when out of bed or walking (bledsoe (locked in exten for gait); unlocked otherwise) ?Other Brace: Bledsoe for RUE locked 0-30 degrees ?Restrictions ?Weight Bearing Restrictions: Yes ?RUE Weight Bearing: Non weight bearing ?LUE Weight Bearing: Weight bearing as tolerated ?LLE Weight Bearing: Weight bearing as tolerated ?Other Position/Activity Restrictions: "She can do anything with her left arm. She can platform, she can use crutches, any activity as  tolerated." Per ortho MD 5.8 ? ?Pain: ?Did not rate but reports L wristt pain and L flank pain. Premedicated.  ? ? ? ? ?Therapy/Group: Individual Therapy ? ?Tedd Sias ?Philip Aspen, PT, DPT, CBIS ? ?12/20/2021, 10:17 AM  ?

## 2021-12-20 NOTE — Progress Notes (Signed)
?                                                       PROGRESS NOTE ? ? ?Subjective/Complaints: ?Bladder urgency is improved.  No new complaints. She is excited she was able to work on stairs.  ? ?Review of Systems  ?Constitutional:  Negative for chills and fever.  ?Respiratory:  Negative for shortness of breath.   ?Cardiovascular:  Negative for chest pain.  ?Gastrointestinal:  Positive for constipation. Negative for nausea and vomiting.  ?Genitourinary:  Positive for frequency and urgency. Negative for dysuria, flank pain and hematuria.  ?Musculoskeletal:  Positive for joint pain. Negative for back pain and neck pain.   ? ? ?Objective: ?  ?DG Elbow 2 Views Right ? ?Result Date: 12/19/2021 ?CLINICAL DATA:  Status post surgical internal fixation of olecranon fracture. EXAM: RIGHT ELBOW - 2 VIEW COMPARISON:  Dec 09, 2021. FINDINGS: Status post surgical internal fixation of olecranon fracture. Good alignment of fracture components is noted. No new fracture or dislocation is noted. IMPRESSION: Postsurgical changes as described above. Electronically Signed   By: Lupita RaiderJames  Green Jr M.D.   On: 12/19/2021 14:08   ?No results for input(s): WBC, HGB, HCT, PLT in the last 72 hours. ? ?No results for input(s): NA, K, CL, CO2, GLUCOSE, BUN, CREATININE, CALCIUM in the last 72 hours. ? ? ?Intake/Output Summary (Last 24 hours) at 12/20/2021 1928 ?Last data filed at 12/20/2021 1800 ?Gross per 24 hour  ?Intake 520 ml  ?Output --  ?Net 520 ml  ? ?  ? ?  ? ?Physical Exam: ?Vital Signs ?Blood pressure (!) 114/55, pulse 61, temperature 98.2 ?F (36.8 ?C), temperature source Oral, resp. rate 18, height 5\' 7"  (1.702 m), weight 81.5 kg, SpO2 97 %. ? ? ?General: Alert and oriented x 3, No apparent distress ?HEENT: Head is normocephalic, atraumatic, PERRLA, EOMI, sclera anicteric, oral mucosa pink and moist, dentition intact  ?Neck: Supple without JVD or lymphadenopathy ?Heart: Reg rate and rhythm.  ?Chest: CTA bilaterally without wheezes, rales,  or rhonchi; no distress ?Abdomen: Soft, non-tender, non-distended, bowel sounds positive. ?Extremities: No clubbing, cyanosis, or edema. Pulses are 2+ ?Psych: Pt's affect is appropriate. Pt is cooperative ?Skin: warm and dry ?Neuro:  Alert and oriented. Follows commands.  CN 2-12 intact. Intact sensation in all 4 to LT. Moving all 4 extremities. ?Musculoskeletal: Right hinged elbow brace. Hinged bledsoe knee brace RLE ?Sensation intact to all fingers of right hand ?No significant tenderness to L wrist palpation, no pain with 1st CMC movement/loading. Some pain with palpation CMC joint. Finkelstein equivocal.  ? ? ? ?Assessment/Plan: ?1. Functional deficits which require 3+ hours per day of interdisciplinary therapy in a comprehensive inpatient rehab setting. ?Physiatrist is providing close team supervision and 24 hour management of active medical problems listed below. ?Physiatrist and rehab team continue to assess barriers to discharge/monitor patient progress toward functional and medical goals ? ?Care Tool: ? ?Bathing ?   ?Body parts bathed by patient: Chest, Abdomen, Front perineal area, Buttocks, Right upper leg, Left upper leg, Right lower leg, Face  ? Body parts bathed by helper: Left arm ?Body parts n/a: Left lower leg ?  ?Bathing assist Assist Level: Minimal Assistance - Patient > 75% ?  ?  ?Upper Body Dressing/Undressing ?Upper body dressing   ?What is the  patient wearing?: Pull over shirt, Bra ?   ?Upper body assist Assist Level: Minimal Assistance - Patient > 75% ?   ?Lower Body Dressing/Undressing ?Lower body dressing ? ? ?   ?What is the patient wearing?: Incontinence brief, Pants ? ?  ? ?Lower body assist Assist for lower body dressing: Moderate Assistance - Patient 50 - 74% ?   ? ?Toileting ?Toileting    ?Toileting assist Assist for toileting: Moderate Assistance - Patient 50 - 74% ?  ?  ?Transfers ?Chair/bed transfer ? ?Transfers assist ?   ? ?Chair/bed transfer assist level: Contact Guard/Touching  assist ?  ?  ?Locomotion ?Ambulation ? ? ?Ambulation assist ? ? Ambulation activity did not occur: Safety/medical concerns ? ?Assist level: Contact Guard/Touching assist ?Assistive device: Cane-quad ?Max distance: 69'  ? ?Walk 10 feet activity ? ? ?Assist ? Walk 10 feet activity did not occur: Safety/medical concerns ? ?Assist level: Contact Guard/Touching assist ?Assistive device: Cane-quad, Cane-straight  ? ?Walk 50 feet activity ? ? ?Assist Walk 50 feet with 2 turns activity did not occur: Safety/medical concerns ? ?Assist level: Contact Guard/Touching assist ?Assistive device: Cane-straight, Cane-quad  ? ? ?Walk 150 feet activity ? ? ?Assist Walk 150 feet activity did not occur: Safety/medical concerns ? ?  ?  ?  ? ?Walk 10 feet on uneven surface  ?activity ? ? ?Assist Walk 10 feet on uneven surfaces activity did not occur: Safety/medical concerns ? ? ?  ?   ? ?Wheelchair ? ? ? ? ?Assist Is the patient using a wheelchair?: Yes ?Type of Wheelchair: Manual ?  ? ?Wheelchair assist level: Supervision/Verbal cueing ?Max wheelchair distance: 150'  ? ? ?Wheelchair 50 feet with 2 turns activity ? ? ? ?Assist ? ?  ?  ? ? ?Assist Level: Supervision/Verbal cueing  ? ?Wheelchair 150 feet activity  ? ? ? ?Assist ?   ? ? ?Assist Level: Supervision/Verbal cueing  ? ?Blood pressure (!) 114/55, pulse 61, temperature 98.2 ?F (36.8 ?C), temperature source Oral, resp. rate 18, height 5\' 7"  (1.702 m), weight 81.5 kg, SpO2 97 %. ? ?Medical Problem List and Plan: ?1. Functional deficits secondary to fall with multiple fractures ?            -patient may shower but incisions must be covered.  ?            -ELOS/Goals: 5/19 ?          -Continue CIR therapies including PT, OT  ? 5/7 -Pt is limited by bilateral UE NWB. She indicates that there is not someone at home to help with mobility. I reviewed intra-articular left radial head fx. It is minimal/non-displaced.  ? 5/8 Ortho Dr. 7/8 reports pt may do any activity with LUE.   ? 5/11  TDC target 5/19, working on safe discharge plan as she doesn't have 24/7 care at home ?2.  Antithrombotics: ?-DVT/anticoagulation:  Pharmaceutical: Other (comment) aspirin 325 mg daily ?            -antiplatelet therapy: Aspirin 325 mg daily ?3. Postoperative pain: continue Tylenol, oxycodone as needed ? -5/7 pt reports pain is controlled ? -5/12 Using oxycodone PRN , ?4. Depression/anxiety: CSW to evaluate and provide support ?            -antipsychotic agents: n/a ?            -continue Lexapro ? -continue low-dose Xanax as needed for anxiety ? -Reports mood is good overall 5/11 ?5. Neuropsych: This patient is capable  of making decisions on her own behalf. ?6. Skin/Wound Care: Routine skin care checks ?            -- Monitor RUE surgical incision once splint is removed; has nylon sutures ? -Splint appears to be wrapped appropriately and intact.  ?7. Fluids/Electrolytes/Nutrition: Routine Is and Os and follow-up chemistries ?8. Right olecranon fracture s/p ORIF ?            --NWB; splint in place, Nylon sutures ? -Ortho started her with R hinged elbow brace for motion 30-110, she should not use elbow for platform weightbearing ?9: Left tibial plateau fracture: touch down weight-bearing ?            -5/8 Ortho reports L Bledsoe leg brace to be locked in extension when ambulating, can be unlocked when in bed ? -5/13 Per Ortho RLE upgraded to WBAT Left leg with brace locked with weightbearing to assist with ambulation ?10: Left radial head fracture:  ?-Ortho Dr. Steward Drone reports pt may do any activity with LUE.   ?11: DM2: continue SSI AC/HS and CBGs ? CBG (last 3)  ?Recent Labs  ?  12/20/21 ?0607 12/20/21 ?1210 12/20/21 ?1608  ?GLUCAP 138* 127* 192*  ? ?             5/7 improvement in cbg's yesterday ? -continueTradjenta in place of patient's Januvia that she uses at home ? 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 She agrees to take this medication knowing its similar to [place] ?5/11 Glucose control improved today, she did not take trad

## 2021-12-20 NOTE — Plan of Care (Signed)
?  Problem: Consults ?Goal: RH GENERAL PATIENT EDUCATION ?Description: See Patient Education module for education specifics. ?Outcome: Progressing ?  ?Problem: RH BOWEL ELIMINATION ?Goal: RH STG MANAGE BOWEL WITH ASSISTANCE ?Description: STG Manage Bowel with  mod I Assistance. ?Outcome: Progressing ?Goal: RH STG MANAGE BOWEL W/MEDICATION W/ASSISTANCE ?Description: STG Manage Bowel with Medication with mod I Assistance. ?Outcome: Progressing ?  ?Problem: RH BLADDER ELIMINATION ?Goal: RH STG MANAGE BLADDER WITH ASSISTANCE ?Description: STG Manage Bladder With toileting Assistance ?Outcome: Progressing ?  ?Problem: RH SKIN INTEGRITY ?Goal: RH STG SKIN FREE OF INFECTION/BREAKDOWN ?Description: With min assist ?Outcome: Progressing ?Goal: RH STG MAINTAIN SKIN INTEGRITY WITH ASSISTANCE ?Description: STG Maintain Skin Integrity With min Assistance. ?Outcome: Progressing ?  ?Problem: RH SAFETY ?Goal: RH STG ADHERE TO SAFETY PRECAUTIONS W/ASSISTANCE/DEVICE ?Description: STG Adhere to Safety Precautions With cues Assistance/Device. ?Outcome: Progressing ?  ?Problem: RH PAIN MANAGEMENT ?Goal: RH STG PAIN MANAGED AT OR BELOW PT'S PAIN GOAL ?Description: At or below level 4 with prns ?Outcome: Progressing ?  ?Problem: RH KNOWLEDGE DEFICIT GENERAL ?Goal: RH STG INCREASE KNOWLEDGE OF SELF CARE AFTER HOSPITALIZATION ?Description: Precautions: Fall ?Other Brace: bledsoe brace locked in extension at all times per secure chat with ortho MD ?Restrictions ?Weight Bearing Restrictions: Yes ?RUE Weight Bearing: Non weight bearing ?LUE Weight Bearing: Touch down weight bearing ?LLE Weight Bearing: Touchdown weight bearing ?Other Position/Activity Restrictions: Per ortho MD: "She may use her left arm for activity and ADLs"  ?Patient will be able to manage care at discharge using handouts and educational resources independently  ?Outcome: Progressing ?  ?Problem: RH KNOWLEDGE DEFICIT ?Goal: RH STG INCREASE KNOWLEDGE OF DIABETES ?Description:  Patient will be able to manage HTN with medications and dietary modifications at discharge using handouts and educational resources independently  ?Outcome: Progressing ?Goal: RH STG INCREASE KNOWLEDGE OF HYPERTENSION ?Outcome: Progressing ?Goal: RH STG INCREASE KNOWLEGDE OF HYPERLIPIDEMIA ?Description: Patient will be able to manage HLD with medications and dietary modifications at discharge using handouts and educational resources independently  ?Outcome: Progressing ?  ?

## 2021-12-21 LAB — GLUCOSE, CAPILLARY
Glucose-Capillary: 146 mg/dL — ABNORMAL HIGH (ref 70–99)
Glucose-Capillary: 124 mg/dL — ABNORMAL HIGH (ref 70–99)
Glucose-Capillary: 120 mg/dL — ABNORMAL HIGH (ref 70–99)
Glucose-Capillary: 167 mg/dL — ABNORMAL HIGH (ref 70–99)

## 2021-12-21 NOTE — Progress Notes (Signed)
Physical Therapy Session Note ? ?Patient Details  ?Name: Alexandra Parsons ?MRN: [id number] ?Date of Birth: [birth year] ? ?Today's Date: 12/21/2021 ?PT Individual Time: 0930-1000 and 1400-1500 ?PT Individual Time Calculation (min): 30 min and 60 min. ? ?Short Term Goals: ?Week 1:  PT Short Term Goal 1 (Week 1): STG= LTG based on ELOS ? ?Skilled Therapeutic Interventions/Progress Updates:  ? ?First session:  Pt presents supine in bed asleep but arouses quickly and agrees to therapy.  Pt performs bed mobility w/ Mod I, and dons Bledsoe to LLE once given.  Pt transfers sit to stand w/ CGA and then amb to BR w/ SBQC and CGA, verbal cues for LLE step length and 2-point gait pattern.  Pt managed clothing w/ supervision and toilet transfer.  Pt continent of bladder in toilet.  Pt required min A for threading pants over L foot 2/2 Bledsoe, but then sat and completed onto R foot and then over hips in standing.  Pt amb to sink for hand washing.  Pt amb x 100' w/ CGA and SBQC w/ cueing for step length and QC placement and progressing to minimal step-through gait pattern.  Pt returned to recliner and remained sitting w/ chair alarm on and all needs in reach. ? ?Second session:Pt presents sitting in recliner and agreeable to therapy.  Pt transfers sit to stand w/ CGA and amb x 10' to w/c.  Pt improved w/ safety w/ turns and management of SBQC this session.  Pt wheeled to main gym w/ LUE and RLE w/ supervision.  Pt negotiated 8 cone obstacle course w/ mirror for visual input and CGA.  Pt required verbal cues for cane management for appropriate steps and step-through gait pattern.  Pt improved w/ 2-point gait pattern.  Pt amb stepping through floor ladder improves placement of QC as pt tends to push too far forward.  Pt negotiated 5" platform w/ CGA and SBQC.  Pt educated on sequencing and performed w/o c/o.  Pt amb multiple trials of 100' w/ CGA.  Pt amb into room and negotiated confined space w/ QC and CGA, verbal cues for safety to  recliner.  LEs elevated and all needs in reach, family present at conclusion of session. ?   ? ?Therapy Documentation ?Precautions:  ?Precautions ?Precautions: Fall ?Required Braces or Orthoses: Other Brace ?Knee Immobilizer - Left: On when out of bed or walking (bledsoe (locked in exten for gait); unlocked otherwise) ?Other Brace: Bledsoe for RUE locked 0-30 degrees ?Restrictions ?Weight Bearing Restrictions: Yes ?RUE Weight Bearing: Non weight bearing ?LUE Weight Bearing: Weight bearing as tolerated ?LLE Weight Bearing: Weight bearing as tolerated ?Other Position/Activity Restrictions: "She can do anything with her left arm. She can platform, she can use crutches, any activity as tolerated." Per ortho MD 5.8 ?General: ?  ?Vital Signs: ?  ?Pain:4/10 L knee w/ WB ?Pain Assessment ?Pain Scale: 0-10 ?Pain Score: 7  ?Pain Type: Surgical pain ?Pain Location: Generalized ?Pain Descriptors / Indicators: Aching;Sharp ?Pain Frequency: Constant ?Pain Onset: On-going ?Pain Intervention(s): Medication (See eMAR) ?Mobility: ? ? ? ? ?Therapy/Group: Individual Therapy ? ?Alexandra Parsons ?12/21/2021, 11:08 AM  ?

## 2021-12-21 NOTE — Progress Notes (Signed)
?                                                       PROGRESS NOTE ? ? ?Subjective/Complaints: ?No new complaints or concerns.  Continues to report some pain  but says its controlled with medications.  ? ?Review of Systems  ?Constitutional:  Negative for chills and fever.  ?Respiratory:  Negative for shortness of breath.   ?Cardiovascular:  Negative for chest pain.  ?Gastrointestinal:  Negative for constipation, nausea and vomiting.  ?Genitourinary:  Positive for frequency. Negative for dysuria, flank pain, hematuria and urgency.  ?Musculoskeletal:  Positive for joint pain. Negative for back pain and neck pain.   ? ? ?Objective: ?  ?No results found. ?No results for input(s): WBC, HGB, HCT, PLT in the last 72 hours. ? ?No results for input(s): NA, K, CL, CO2, GLUCOSE, BUN, CREATININE, CALCIUM in the last 72 hours. ? ? ?Intake/Output Summary (Last 24 hours) at 12/21/2021 1733 ?Last data filed at 12/21/2021 0400 ?Gross per 24 hour  ?Intake 340 ml  ?Output --  ?Net 340 ml  ? ?  ? ?  ? ?Physical Exam: ?Vital Signs ?Blood pressure 131/78, pulse 63, temperature 98.5 ?F (36.9 ?C), temperature source Oral, resp. rate 18, height 5\' 7"  (1.702 m), weight 81.5 kg, SpO2 97 %. ? ? ?General: Alert and oriented x 3, No apparent distress ?HEENT: Head is normocephalic, atraumatic, PERRLA, EOMI, sclera anicteric, oral mucosa pink and moist, dentition intact  ?Neck: Supple without ?Heart: Reg rate and rhythm.  ?Chest: CTA bilaterally without wheezes, rales, or rhonchi; no distress ?Abdomen: Soft, non-tender, non-distended, bowel sounds positive. ?Extremities: No clubbing, cyanosis, or edema. Pulses are 2+ ?Psych: Pt's affect is appropriate. Pt is cooperative ?Skin: warm and dry, no breakdown noted ?Neuro:  Alert and oriented. Follows commands.  CN 2-12 intact. Intact sensation in all 4 to LT. Moving all 4 extremities. ?Musculoskeletal: Right hinged elbow brace. Hinged bledsoe knee brace RLE ?Sensation intact to all fingers of right  hand ?No significant tenderness to L wrist palpation, no pain with 1st CMC movement/loading. Some pain with palpation CMC joint. Finkelstein equivocal.  ? ? ? ?Assessment/Plan: ?1. Functional deficits which require 3+ hours per day of interdisciplinary therapy in a comprehensive inpatient rehab setting. ?Physiatrist is providing close team supervision and 24 hour management of active medical problems listed below. ?Physiatrist and rehab team continue to assess barriers to discharge/monitor patient progress toward functional and medical goals ? ?Care Tool: ? ?Bathing ?   ?Body parts bathed by patient: Chest, Abdomen, Front perineal area, Buttocks, Right upper leg, Left upper leg, Right lower leg, Face  ? Body parts bathed by helper: Left arm ?Body parts n/a: Left lower leg ?  ?Bathing assist Assist Level: Minimal Assistance - Patient > 75% ?  ?  ?Upper Body Dressing/Undressing ?Upper body dressing   ?What is the patient wearing?: Pull over shirt, Bra ?   ?Upper body assist Assist Level: Minimal Assistance - Patient > 75% ?   ?Lower Body Dressing/Undressing ?Lower body dressing ? ? ?   ?What is the patient wearing?: Incontinence brief, Pants ? ?  ? ?Lower body assist Assist for lower body dressing: Moderate Assistance - Patient 50 - 74% ?   ? ?Toileting ?Toileting    ?Toileting assist Assist for toileting: Supervision/Verbal cueing ?  ?  ?  Transfers ?Chair/bed transfer ? ?Transfers assist ?   ? ?Chair/bed transfer assist level: Contact Guard/Touching assist ?  ?  ?Locomotion ?Ambulation ? ? ?Ambulation assist ? ? Ambulation activity did not occur: Safety/medical concerns ? ?Assist level: Contact Guard/Touching assist ?Assistive device: Cane-quad ?Max distance: 100  ? ?Walk 10 feet activity ? ? ?Assist ? Walk 10 feet activity did not occur: Safety/medical concerns ? ?Assist level: Contact Guard/Touching assist ?Assistive device: Cane-quad  ? ?Walk 50 feet activity ? ? ?Assist Walk 50 feet with 2 turns activity did not  occur: Safety/medical concerns ? ?Assist level: Contact Guard/Touching assist ?Assistive device: Cane-quad  ? ? ?Walk 150 feet activity ? ? ?Assist Walk 150 feet activity did not occur: Safety/medical concerns ? ?  ?  ?  ? ?Walk 10 feet on uneven surface  ?activity ? ? ?Assist Walk 10 feet on uneven surfaces activity did not occur: Safety/medical concerns ? ? ?  ?   ? ?Wheelchair ? ? ? ? ?Assist Is the patient using a wheelchair?: Yes ?Type of Wheelchair: Manual ?  ? ?Wheelchair assist level: Supervision/Verbal cueing ?Max wheelchair distance: 150'  ? ? ?Wheelchair 50 feet with 2 turns activity ? ? ? ?Assist ? ?  ?  ? ? ?Assist Level: Supervision/Verbal cueing  ? ?Wheelchair 150 feet activity  ? ? ? ?Assist ?   ? ? ?Assist Level: Supervision/Verbal cueing  ? ?Blood pressure 131/78, pulse 63, temperature 98.5 ?F (36.9 ?C), temperature source Oral, resp. rate 18, height 5\' 7"  (1.702 m), weight 81.5 kg, SpO2 97 %. ? ?Medical Problem List and Plan: ?1. Functional deficits secondary to fall with multiple fractures ?            -patient may shower but incisions must be covered.  ?            -ELOS/Goals: 5/19 ?          -Continue CIR therapies including PT, OT  ? 5/7 -Pt is limited by bilateral UE NWB. She indicates that there is not someone at home to help with mobility. I reviewed intra-articular left radial head fx. It is minimal/non-displaced.  ? 5/8 Ortho Dr. 7/8 reports pt may do any activity with LUE.   ? 5/11 TDC target 5/19, working on safe discharge plan as she doesn't have 24/7 care at home ? -Weightbearing status upgraded by ortho 5/12 ?2.  Antithrombotics: ?-DVT/anticoagulation:  Pharmaceutical: Other (comment) aspirin 325 mg daily ?            -antiplatelet therapy: Aspirin 325 mg daily ?3. Postoperative pain: continue Tylenol, oxycodone as needed ? -5/7 pt reports pain is controlled ? -5/12 Using oxycodone PRN , ?4. Depression/anxiety: CSW to evaluate and provide support ?            -antipsychotic  agents: n/a ?            -continue Lexapro ? -continue low-dose Xanax as needed for anxiety ? -Reports mood is good overall 5/11 ?5. Neuropsych: This patient is capable of making decisions on her own behalf. ?6. Skin/Wound Care: Routine skin care checks ?            -- Monitor RUE surgical incision once splint is removed; has nylon sutures ? -Splint appears to be wrapped appropriately and intact.  ?7. Fluids/Electrolytes/Nutrition: Routine Is and Os and follow-up chemistries ?8. Right olecranon fracture s/p ORIF ?            --NWB; splint in place, Nylon sutures ? -  Ortho started her with R hinged elbow brace for motion 30-110, she should not use elbow for platform weightbearing ?9: Left tibial plateau fracture: touch down weight-bearing ?            -5/8 Ortho reports L Bledsoe leg brace to be locked in extension when ambulating, can be unlocked when in bed ? -5/13 Per Ortho RLE upgraded to WBAT Left leg with brace locked with weightbearing to assist with ambulation ?10: Left radial head fracture:  ?-Ortho Dr. Steward DroneBokshan reports pt may do any activity with LUE.   ?11: DM2: continue SSI AC/HS and CBGs ? CBG (last 3)  ?Recent Labs  ?  12/21/21 ?0608 12/21/21 ?1116 12/21/21 ?1658  ?GLUCAP 146* 124* 120*  ? ?             5/7 improvement in cbg's yesterday ? -continueTradjenta in place of patient's Januvia that she uses at home ? 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 She agrees to take this medication knowing its similar to [place]uvia ?5/11 Glucose control improved today, she did not take tradjenta, agrees to take ?12: ABLA: improving ? -5/8 CBC 11.3, improved continue to monitor ?13: Constipation: is chronic;  Stools softeners not usually effective ? -5/11 schedule miralax ? -5/14 BM yesterday, continue regimen ?14: Hypoalbuminemia: continue  protein supplements ?15: Tobacco use: cessation counseling; continue Nicoderm ?16: Hypertension: continue Ziac ? 5/8 BP overall well controlled, continue to monitor ? -5/13 BP a little low, will stop Ziac and mo

## 2021-12-21 NOTE — Plan of Care (Signed)
?  Problem: Consults ?Goal: RH GENERAL PATIENT EDUCATION ?Description: See Patient Education module for education specifics. ?Outcome: Progressing ?  ?Problem: RH BOWEL ELIMINATION ?Goal: RH STG MANAGE BOWEL WITH ASSISTANCE ?Description: STG Manage Bowel with  mod I Assistance. ?Outcome: Progressing ?Goal: RH STG MANAGE BOWEL W/MEDICATION W/ASSISTANCE ?Description: STG Manage Bowel with Medication with mod I Assistance. ?Outcome: Progressing ?  ?Problem: RH BLADDER ELIMINATION ?Goal: RH STG MANAGE BLADDER WITH ASSISTANCE ?Description: STG Manage Bladder With toileting Assistance ?Outcome: Progressing ?  ?Problem: RH SKIN INTEGRITY ?Goal: RH STG SKIN FREE OF INFECTION/BREAKDOWN ?Description: With min assist ?Outcome: Progressing ?Goal: RH STG MAINTAIN SKIN INTEGRITY WITH ASSISTANCE ?Description: STG Maintain Skin Integrity With min Assistance. ?Outcome: Progressing ?  ?Problem: RH SAFETY ?Goal: RH STG ADHERE TO SAFETY PRECAUTIONS W/ASSISTANCE/DEVICE ?Description: STG Adhere to Safety Precautions With cues Assistance/Device. ?Outcome: Progressing ?  ?Problem: RH PAIN MANAGEMENT ?Goal: RH STG PAIN MANAGED AT OR BELOW PT'S PAIN GOAL ?Description: At or below level 4 with prns ?Outcome: Progressing ?  ?Problem: RH KNOWLEDGE DEFICIT GENERAL ?Goal: RH STG INCREASE KNOWLEDGE OF SELF CARE AFTER HOSPITALIZATION ?Description: Precautions: Fall ?Other Brace: bledsoe brace locked in extension at all times per secure chat with ortho MD ?Restrictions ?Weight Bearing Restrictions: Yes ?RUE Weight Bearing: Non weight bearing ?LUE Weight Bearing: Touch down weight bearing ?LLE Weight Bearing: Touchdown weight bearing ?Other Position/Activity Restrictions: Per ortho MD: "She may use her left arm for activity and ADLs"  ?Patient will be able to manage care at discharge using handouts and educational resources independently  ?Outcome: Progressing ?  ?Problem: RH KNOWLEDGE DEFICIT ?Goal: RH STG INCREASE KNOWLEDGE OF DIABETES ?Description:  Patient will be able to manage HTN with medications and dietary modifications at discharge using handouts and educational resources independently  ?Outcome: Progressing ?Goal: RH STG INCREASE KNOWLEDGE OF HYPERTENSION ?Outcome: Progressing ?Goal: RH STG INCREASE KNOWLEGDE OF HYPERLIPIDEMIA ?Description: Patient will be able to manage HLD with medications and dietary modifications at discharge using handouts and educational resources independently  ?Outcome: Progressing ?  ?

## 2021-12-21 NOTE — Progress Notes (Signed)
Occupational Therapy Session Note ? ?Patient Details  ?Name: KYLE STANSELL ?MRN: [id number] ?Date of Birth: 05/09/1961 ? ?Today's Date: 12/21/2021 ?OT Individual Time: 1100-1200 ?OT Individual Time Calculation (min): 60 min  ? ? ?Short Term Goals: ?Week 1:  OT Short Term Goal 1 (Week 1): Pt will complete toileting with supervision ?OT Short Term Goal 1 - Progress (Week 1): Progressing toward goal ?OT Short Term Goal 2 (Week 1): Pt will complete bathroom transfers with supervision while adhering to [facility] precautions ?OT Short Term Goal 2 - Progress (Week 1): Progressing toward goal ?OT Short Term Goal 3 (Week 1): PT will complete LB dressing with supervision ?OT Short Term Goal 3 - Progress (Week 1): Progressing toward goal ?Week 2:  OT Short Term Goal 1 (Week 2): STGs = LTGs ? ?Skilled Therapeutic Interventions/Progress Updates:  ?  Patient in room seated in her w/c, patient's progressive splint adjusted to improve compliance.  Patient able to come from sit to stand to complete table top activity with SBA incorporating quad walker for greater balance and safety. Patient able to tolerate static and dynamic standing for greater than in duration with 2 rest breaks, patient encouraged to incorporate relaxation breathing for greater compliance. The pt was able to complete a simulated task in LB dressing while adhering for weight bearing status, the pt required SBA and v/c's for donning and doffing during LB dressing. The pt was able to ambulate to the bathroom using the quad walker and additional time with CGA.  The pt was able to complete toileting with S for doffing her brief and pant while transitioning into dynamic standing position.  The pt was able to transfer to the recliner using the quad walker and CGA with her bedside table  and call light within reach and her alarm activated. All additional needs were addressed prior to exiting the room.  ? ?Therapy Documentation ?Precautions:  ?Precautions ?Precautions:  Fall ?Required Braces or Orthoses: Other Brace ?Knee Immobilizer - Left: On when out of bed or walking (bledsoe (locked in exten for gait); unlocked otherwise) ?Other Brace: Bledsoe for RUE locked 0-30 degrees ?Restrictions ?Weight Bearing Restrictions: Yes ?RUE Weight Bearing: Non weight bearing ?LUE Weight Bearing: Weight bearing as tolerated ?LLE Weight Bearing: Weight bearing as tolerated ?Other Position/Activity Restrictions: "She can do anything with her left arm. She can platform, she can use crutches, any activity as tolerated." Per ortho MD 5.8 ? ?Therapy/Group: Individual Therapy ? ?[place] ?12/21/2021, 6:52 PM ?

## 2021-12-22 DIAGNOSIS — A499 Bacterial infection, unspecified: Secondary | ICD-10-CM

## 2021-12-22 LAB — CBC
HCT: 32.1 % — ABNORMAL LOW (ref 36.0–46.0)
Hemoglobin: 10.6 g/dL — ABNORMAL LOW (ref 12.0–15.0)
MCH: 32.2 pg (ref 26.0–34.0)
MCHC: 33 g/dL (ref 30.0–36.0)
MCV: 97.6 fL (ref 80.0–100.0)
Platelets: 242 10*3/uL (ref 150–400)
RBC: 3.29 MIL/uL — ABNORMAL LOW (ref 3.87–5.11)
RDW: 13.8 % (ref 11.5–15.5)
WBC: 7.1 10*3/uL (ref 4.0–10.5)
nRBC: 0 % (ref 0.0–0.2)

## 2021-12-22 LAB — GLUCOSE, CAPILLARY
Glucose-Capillary: 97 mg/dL (ref 70–99)
Glucose-Capillary: 125 mg/dL — ABNORMAL HIGH (ref 70–99)
Glucose-Capillary: 130 mg/dL — ABNORMAL HIGH (ref 70–99)
Glucose-Capillary: 191 mg/dL — ABNORMAL HIGH (ref 70–99)

## 2021-12-22 LAB — BASIC METABOLIC PANEL
Anion gap: 5 (ref 5–15)
BUN: 14 mg/dL (ref 8–23)
CO2: 27 mmol/L (ref 22–32)
Calcium: 8.7 mg/dL — ABNORMAL LOW (ref 8.9–10.3)
Chloride: 104 mmol/L (ref 98–111)
Creatinine, Ser: 0.92 mg/dL (ref 0.44–1.00)
GFR, Estimated: >60 mL/min (ref >60)
Glucose, Bld: 116 mg/dL — ABNORMAL HIGH (ref 70–99)
Potassium: 4 mmol/L (ref 3.5–5.1)
Sodium: 136 mmol/L (ref 135–145)
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
## 2021-12-22 NOTE — Progress Notes (Signed)
?                                                       PROGRESS NOTE ? ? ?Subjective/Complaints: ?No new complaints or concerns.  Continues to report some pain  but says its controlled with medications.  ? ?Review of Systems  ?Constitutional:  Negative for chills and fever.  ?Respiratory:  Negative for shortness of breath.   ?Cardiovascular:  Negative for chest pain.  ?Gastrointestinal:  Negative for constipation, nausea and vomiting.  ?Genitourinary:  Positive for frequency. Negative for dysuria, flank pain, hematuria and urgency.  ?Musculoskeletal:  Positive for joint pain. Negative for back pain and neck pain.   ? ? ?Objective: ?  ?No results found. ?Recent Labs  ?  12/22/21 ?0702  ?WBC 7.1  ?HGB 10.6*  ?HCT 32.1*  ?PLT 242  ? ? ?Recent Labs  ?  12/22/21 ?0702  ?NA 136  ?K 4.0  ?CL 104  ?CO2 27  ?GLUCOSE 116*  ?BUN 14  ?CREATININE 0.92  ?CALCIUM 8.7*  ? ? ? ?Intake/Output Summary (Last 24 hours) at 12/22/2021 1045 ?Last data filed at 12/22/2021 0916 ?Gross per 24 hour  ?Intake 836 ml  ?Output 350 ml  ?Net 486 ml  ? ?  ? ?  ? ?Physical Exam: ?Vital Signs ?Blood pressure (!) 147/53, pulse 64, temperature 98.1 ?F (36.7 ?C), temperature source Oral, resp. rate 18, height 5\' 7"  (1.702 m), weight 81.5 kg, SpO2 99 %. ? ? ?Constitutional: No distress . Vital signs reviewed. ?HEENT: NCAT, EOMI, oral membranes moist ?Neck: supple ?Cardiovascular: RRR without murmur. No JVD    ?Respiratory/Chest: CTA Bilaterally without wheezes or rales. Normal effort    ?GI/Abdomen: BS +, non-tender, non-distended ?Ext: no clubbing, cyanosis, or edema ?Psych: pleasant and cooperative  ?Skin: warm and dry, no breakdown noted ?Neuro:  Alert and oriented. Follows commands.  CN 2-12 intact. Intact sensation in all 4 to LT. Moving all 4 extremities. ?Musculoskeletal: Right hinged elbow brace. Hinged bledsoe knee brace RLE ?Sensation intact to all fingers of right hand. Right UE tender with PROM/AROM ?No significant tenderness to L wrist  palpation, no pain with 1st CMC movement/loading. Some pain with palpation CMC joint. Finkelstein equivocal.  ? ? ? ?Assessment/Plan: ?1. Functional deficits which require 3+ hours per day of interdisciplinary therapy in a comprehensive inpatient rehab setting. ?Physiatrist is providing close team supervision and 24 hour management of active medical problems listed below. ?Physiatrist and rehab team continue to assess barriers to discharge/monitor patient progress toward functional and medical goals ? ?Care Tool: ? ?Bathing ?   ?Body parts bathed by patient: Chest, Abdomen, Front perineal area, Buttocks, Right upper leg, Left upper leg, Right lower leg, Face  ? Body parts bathed by helper: Left arm ?Body parts n/a: Left lower leg ?  ?Bathing assist Assist Level: Minimal Assistance - Patient > 75% ?  ?  ?Upper Body Dressing/Undressing ?Upper body dressing   ?What is the patient wearing?: Pull over shirt, Bra ?   ?Upper body assist Assist Level: Minimal Assistance - Patient > 75% ?   ?Lower Body Dressing/Undressing ?Lower body dressing ? ? ?   ?What is the patient wearing?: Incontinence brief, Pants ? ?  ? ?Lower body assist Assist for lower body dressing: Moderate Assistance - Patient 50 - 74% ?   ? ?  Toileting ?Toileting    ?Toileting assist Assist for toileting: Supervision/Verbal cueing ?  ?  ?Transfers ?Chair/bed transfer ? ?Transfers assist ?   ? ?Chair/bed transfer assist level: Contact Guard/Touching assist ?  ?  ?Locomotion ?Ambulation ? ? ?Ambulation assist ? ? Ambulation activity did not occur: Safety/medical concerns ? ?Assist level: Contact Guard/Touching assist ?Assistive device: Cane-quad ?Max distance: 100  ? ?Walk 10 feet activity ? ? ?Assist ? Walk 10 feet activity did not occur: Safety/medical concerns ? ?Assist level: Contact Guard/Touching assist ?Assistive device: Cane-quad  ? ?Walk 50 feet activity ? ? ?Assist Walk 50 feet with 2 turns activity did not occur: Safety/medical concerns ? ?Assist  level: Contact Guard/Touching assist ?Assistive device: Cane-quad  ? ? ?Walk 150 feet activity ? ? ?Assist Walk 150 feet activity did not occur: Safety/medical concerns ? ?  ?  ?  ? ?Walk 10 feet on uneven surface  ?activity ? ? ?Assist Walk 10 feet on uneven surfaces activity did not occur: Safety/medical concerns ? ? ?  ?   ? ?Wheelchair ? ? ? ? ?Assist Is the patient using a wheelchair?: Yes ?Type of Wheelchair: Manual ?  ? ?Wheelchair assist level: Supervision/Verbal cueing ?Max wheelchair distance: 150'  ? ? ?Wheelchair 50 feet with 2 turns activity ? ? ? ?Assist ? ?  ?  ? ? ?Assist Level: Supervision/Verbal cueing  ? ?Wheelchair 150 feet activity  ? ? ? ?Assist ?   ? ? ?Assist Level: Supervision/Verbal cueing  ? ?Blood pressure (!) 147/53, pulse 64, temperature 98.1 ?F (36.7 ?C), temperature source Oral, resp. rate 18, height 5\' 7"  (1.702 m), weight 81.5 kg, SpO2 99 %. ? ?Medical Problem List and Plan: ?1. Functional deficits secondary to fall with multiple fractures ?            -patient may shower but incisions must be covered.  ?            -ELOS/Goals: 5/19 ?         -Continue CIR therapies including PT, OT  ? 5/7 -Pt is limited by bilateral UE NWB. She indicates that there is not someone at home to help with mobility. I reviewed intra-articular left radial head fx. It is minimal/non-displaced.  ? 5/8 Ortho Dr. 7/8 reports pt may do any activity with LUE.   ? 5/11 TDC target 5/19, working on safe discharge plan as she doesn't have 24/7 care at home ? -Weightbearing status upgraded by ortho 5/12 ?2.  Antithrombotics: ?-DVT/anticoagulation:  Pharmaceutical: Other (comment) aspirin 325 mg daily ?            -antiplatelet therapy: Aspirin 325 mg daily ?3. Postoperative pain: continue Tylenol, oxycodone as needed ? -5/7 pt reports pain is controlled ? -5/15 Using oxycodone PRN , ?4. Depression/anxiety: CSW to evaluate and provide support ?            -antipsychotic agents: n/a ?            -continue  Lexapro ? -continue low-dose Xanax as needed for anxiety ? -Reports mood is good overall 5/11 ?5. Neuropsych: This patient is capable of making decisions on her own behalf. ?6. Skin/Wound Care: Routine skin care checks ?            -- Monitor RUE surgical incision once splint is removed; has nylon sutures ? -Splint appears to be wrapped appropriately and intact.  ?7. Fluids/Electrolytes/Nutrition: Routine Is and Os and follow-up chemistries ?8. Right olecranon fracture s/p ORIF ?            --  NWB; splint in place, Nylon sutures ? -Ortho started her with R hinged elbow brace for motion 30-110, she should not use elbow for platform weightbearing ?9: Left tibial plateau fracture: touch down weight-bearing ?            -5/8 Ortho reports L Bledsoe leg brace to be locked in extension when ambulating, can be unlocked when in bed ? -5/13 Per Ortho RLE upgraded to WBAT Left leg with brace locked with weightbearing to assist with ambulation ?10: Left radial head fracture:  ?-Ortho Dr. Steward DroneBokshan reports pt may do any activity with LUE.   ?11: DM2: continue SSI AC/HS and CBGs ? CBG (last 3)  ?Recent Labs  ?  12/21/21 ?1658 12/21/21 ?2059 12/22/21 ?0600  ?GLUCAP 120* 167* 97  ? ?             5/7 improvement in cbg's yesterday ? -continueTradjenta in place of patient's Januvia that she uses at home ? 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 She agrees to take this medication knowing its similar to [place]ia ?5/11 Glucose control improved today, she did not take tradjenta, agrees to take ?5/15 fair control, no changes today ?12: ABLA: improving ? -5/8 CBC 11.3, improved continue to monitor ?13: Constipation: is chronic;  Stools softeners not usually effective ? -5/11 schedule miralax ? -5/14 BM yesterday, continue regimen ?14: Hypoalbuminemia: continue  protein supplements ?15: Tobacco use: cessation counseling; continue Nicoderm ?16: Hypertension: continue Ziac ? 5/8 BP overall well controlled, continue to monitor ? -5/13 BP a little low, will stop Ziac and monitor ? -5/

## 2021-12-22 NOTE — Progress Notes (Addendum)
Patient ID: Alexandra Parsons, female   DOB: 09-12-1960, 61 y.o.   MRN: [id number]  Team feels pt is doing much better with her WBAT now and feel the plan can change back to going home instead of NHP. Have reached out to [employer] to update her and make sure she had all equipment needs. Made aware could go home as early as Wed. Will need to hear back from workers comp and see if equipment can be delivered by then. Await response from Selena Batten ? ?1:18 PM Spoke with pt who is very glad with how well she os doing now and feels comfortable with going home with intermittent assist from daughter. Have update [employer] regarding no need for ramp now, pt is doing three stairs. Still awaiting Kim's return call. ?

## 2021-12-22 NOTE — Progress Notes (Signed)
Physical Therapy Weekly Progress Note ? ?Patient Details  ?Name: Alexandra Parsons ?MRN: [id number] ?Date of Birth: April 11, 1961 ? ?Beginning of progress report period: Dec 13, 2021 ?End of progress report period: Dec 22, 2021 ? ?Today's Date: 12/22/2021 ?PT Individual Time: 8144-8185 ?PT Individual Time Calculation (min): 73 min  ? ?Patient has met 0 of 1 short term goals.  Patient is making great progress toward her goals. She is grossly supervision/light CGA using a SBQC. She is able to negotiate 12 steps with light CGA/close supervision and verbal cues for sequence, as well as ambulate household distance/short community distances with supervision.  ? ?Patient continues to demonstrate the following deficits muscle weakness, decreased cardiorespiratoy endurance, and decreased sitting balance, decreased standing balance, decreased postural control, decreased balance strategies, and difficulty maintaining precautions and therefore will continue to benefit from skilled PT intervention to increase functional independence with mobility. Patient with longer than expected LOS due to upgraded weight bearing precautions partway through stay and need to be ModI/intermittent supervision due to limited support at home.  ? ?Patient progressing toward long term goals..  Continue plan of care. ? ?PT Short Term Goals ?Week 1:  PT Short Term Goal 1 (Week 1): STG= LTG based on ELOS ?PT Short Term Goal 1 - Progress (Week 1): Progressing toward goal ?Week 2:  PT Short Term Goal 1 (Week 2): STG= LTG based on ELOS ? ?Skilled Therapeutic Interventions/Progress Updates:  ?  Patient received sitting EOB, agreeable to PT. She reports that her pain is well controlled.  She was able to lock her Bledsoe LE brace herself and ambulate to wc with [facility] and close supervision. PT transporting patient in wc to therapy gym for time management and energy conservation. She was able to ambulate to the stairs with supervision and navigate 8steps with [facility] and  light CGA/close supervision. Patient completing the following therex: LAQ, HSC, hip flexion, 4-way ankle with yellow theraband. PT providing patient with HEP. She ambulated partway back to her room with [facility] and supervision. Returning to bed, bed alarm on, call light within reach.  ? ?Therapy Documentation ?Precautions:  ?Precautions ?Precautions: Fall ?Required Braces or Orthoses: Other Brace ?Knee Immobilizer - Left: On when out of bed or walking (bledsoe (locked in exten for gait); unlocked otherwise) ?Other Brace: Bledsoe for RUE locked 0-30 degrees ?Restrictions ?Weight Bearing Restrictions: Yes ?RUE Weight Bearing: Non weight bearing ?LUE Weight Bearing: Weight bearing as tolerated ?LLE Weight Bearing: Weight bearing as tolerated ?Other Position/Activity Restrictions: "She can do anything with her left arm. She can platform, she can use crutches, any activity as tolerated." Per ortho MD 5.8 ? ? ? ?Therapy/Group: Individual Therapy ? ?Debbora Dus ?Debbora Dus, PT, DPT, CBIS ? ?12/22/2021, 7:44 AM  ?

## 2021-12-23 ENCOUNTER — Inpatient Hospital Stay (HOSPITAL_COMMUNITY): Payer: BC Managed Care – PPO

## 2021-12-23 DIAGNOSIS — M79602 Pain in left arm: Secondary | ICD-10-CM

## 2021-12-23 LAB — GLUCOSE, CAPILLARY
Glucose-Capillary: 126 mg/dL — ABNORMAL HIGH (ref 70–99)
Glucose-Capillary: 132 mg/dL — ABNORMAL HIGH (ref 70–99)
Glucose-Capillary: 174 mg/dL — ABNORMAL HIGH (ref 70–99)
Glucose-Capillary: 117 mg/dL — ABNORMAL HIGH (ref 70–99)

## 2021-12-23 NOTE — Progress Notes (Signed)
Physical Therapy Session Note ? ?Patient Details  ?Name: Alexandra Parsons ?MRN: [id number] ?Date of Birth: 29-Dec-1960 ? ?Today's Date: 12/23/2021 ?PT Individual Time: 2542-7062 ?PT Individual Time Calculation (min): 60 min  ? ?Short Term Goals: ?Week 1:  PT Short Term Goal 1 (Week 1): STG= LTG based on ELOS ?PT Short Term Goal 1 - Progress (Week 1): Progressing toward goal ?Week 2:  PT Short Term Goal 1 (Week 2): STG= LTG based on ELOS ? ?Skilled Therapeutic Interventions/Progress Updates:  ?Patient supine in bed on entrance to room. Patient alert and agreeable to PT session. Dresses while seated EOB. UB dressing with supervision and MinA for elbow brace in shirt, then Mod A to don knee brace and supervision to bring pants up. ? ?Patient with mild pain complaint in R elbow throughout session. ? ?Therapeutic Activity: ?Bed Mobility: Patient performed supine --> sit with supervision. VC required for maintaining WB precautions. Return to supine with supervision and no cues required.  ?Transfers: Patient performed sit<>stand and stand pivot transfers throughout session with close supervision. Provided verbal cues for push with LUE only. ? ?Gait Training:  ?Patient ambulated 165' x2 ft using SBQC with close supervision. Demonstrated Slow pace with ipsilateral cane progression with step progression d/t inability to use RUE for WB. During 1st amb bout, pt c/o sharp pain with WB and step progression with LLE just medial to where pt indicates location of fx. On sitting pt guided in below movements. After therex, pt relates significant decrease in knee pain ? ?Therapeutic Exercise: ?Patient guided in seated heel slides on floor with foot on pillow case for decreased friction and in marches for LLE. Performed 10x3 for LLE. On return to gait, pt relates that L knee pain is gone for the moment. Pt encouraged to continue performance of heel slides  in bed as pain allows and to include ankle pumps and SLR in order to continue to  mobilize joints and move swelling away from knee.  ? ?Patient seated on toilet at end of session with NT notified as to location. Pt encouraged to use call light to inform NT when ready. ? ? ?Therapy Documentation ?Precautions:  ?Precautions ?Precautions: Fall ?Required Braces or Orthoses: Other Brace ?Knee Immobilizer - Left: On when out of bed or walking (bledsoe (locked in exten for gait); unlocked otherwise) ?Other Brace: Bledsoe for RUE locked 0-30 degrees ?Restrictions ?Weight Bearing Restrictions: Yes ?RUE Weight Bearing: Non weight bearing ?LUE Weight Bearing: Weight bearing as tolerated ?LLE Weight Bearing: Weight bearing as tolerated ?Other Position/Activity Restrictions: "She can do anything with her left arm. She can platform, she can use crutches, any activity as tolerated." Per ortho MD 5.8 ?General: ?  ?Vital Signs: ?Therapy Vitals ?Temp: 98.7 ?F (37.1 ?C) ?Temp Source: Oral ?Pulse Rate: 64 ?Resp: 16 ?BP: (!) 101/56 ?Patient Position (if appropriate): Lying ?Oxygen Therapy ?SpO2: 95 % ?O2 Device: Room Air ?Pain: ?Pain Assessment ?Pain Score: 8  ? ?Therapy/Group: Individual Therapy ? ?Loel Dubonnet PT, DPT ?12/23/2021, 4:51 PM  ?

## 2021-12-23 NOTE — Progress Notes (Signed)
?                                                       PROGRESS NOTE ? ? ?Subjective/Complaints: ?She reports some increased  L knee pain after she twisted it but says its controlled with medications.  ? ?Review of Systems  ?Constitutional:  Negative for chills and fever.  ?Respiratory:  Negative for shortness of breath.   ?Cardiovascular:  Negative for chest pain.  ?Gastrointestinal:  Negative for constipation, nausea and vomiting.  ?Genitourinary:  Positive for frequency. Negative for dysuria, flank pain, hematuria and urgency.  ?Musculoskeletal:  Positive for joint pain. Negative for back pain and neck pain.   ? ? ?Objective: ?  ?No results found. ?Recent Labs  ?  12/22/21 ?0702  ?WBC 7.1  ?HGB 10.6*  ?HCT 32.1*  ?PLT 242  ? ? ?Recent Labs  ?  12/22/21 ?0702  ?NA 136  ?K 4.0  ?CL 104  ?CO2 27  ?GLUCOSE 116*  ?BUN 14  ?CREATININE 0.92  ?CALCIUM 8.7*  ? ? ? ?Intake/Output Summary (Last 24 hours) at 12/23/2021 0734 ?Last data filed at 12/22/2021 1917 ?Gross per 24 hour  ?Intake 1080 ml  ?Output --  ?Net 1080 ml  ? ?  ? ?  ? ?Physical Exam: ?Vital Signs ?Blood pressure 138/63, pulse (!) 54, temperature 97.8 ?F (36.6 ?C), resp. rate 17, height 5\' 7"  (1.702 m), weight 81.5 kg, SpO2 94 %. ? ? ?Constitutional: No distress . Vital signs reviewed. ?HEENT: NCAT, EOMI, oral membranes moist ?Neck: supple ?Cardiovascular: RRR without murmur. No JVD    ?Respiratory/Chest: CTA Bilaterally without wheezes or rales. Normal effort    ?GI/Abdomen: BS +, non-tender, non-distended ?Ext: no clubbing, cyanosis, or edema ?Psych: pleasant and cooperative  ?Skin: warm and dry, no breakdown noted ?Neuro:  Alert and oriented. Follows commands.  CN 2-12 intact. Intact sensation in all 4 to LT. Moving all 4 extremities. ?Musculoskeletal: Right hinged elbow brace. Hinged bledsoe knee brace LLE ?Sensation intact to all fingers of right hand. Right UE tender with PROM/AROM ?No significant tenderness to L wrist palpation, no pain with 1st CMC  movement/loading. Some pain with palpation CMC joint. Finkelstein equivocal.  ?Tenderness around L knee to palpation ? ? ?Assessment/Plan: ?1. Functional deficits which require 3+ hours per day of interdisciplinary therapy in a comprehensive inpatient rehab setting. ?Physiatrist is providing close team supervision and 24 hour management of active medical problems listed below. ?Physiatrist and rehab team continue to assess barriers to discharge/monitor patient progress toward functional and medical goals ? ?Care Tool: ? ?Bathing ?   ?Body parts bathed by patient: Chest, Abdomen, Front perineal area, Buttocks, Right upper leg, Left upper leg, Right lower leg, Face  ? Body parts bathed by helper: Left arm ?Body parts n/a: Left lower leg ?  ?Bathing assist Assist Level: Minimal Assistance - Patient > 75% ?  ?  ?Upper Body Dressing/Undressing ?Upper body dressing   ?What is the patient wearing?: Pull over shirt, Bra ?   ?Upper body assist Assist Level: Minimal Assistance - Patient > 75% ?   ?Lower Body Dressing/Undressing ?Lower body dressing ? ? ?   ?What is the patient wearing?: Incontinence brief, Pants ? ?  ? ?Lower body assist Assist for lower body dressing: Moderate Assistance - Patient 50 - 74% ?   ? ?  Toileting ?Toileting    ?Toileting assist Assist for toileting: Supervision/Verbal cueing ?  ?  ?Transfers ?Chair/bed transfer ? ?Transfers assist ?   ? ?Chair/bed transfer assist level: Contact Guard/Touching assist ?  ?  ?Locomotion ?Ambulation ? ? ?Ambulation assist ? ? Ambulation activity did not occur: Safety/medical concerns ? ?Assist level: Contact Guard/Touching assist ?Assistive device: Cane-quad ?Max distance: 100  ? ?Walk 10 feet activity ? ? ?Assist ? Walk 10 feet activity did not occur: Safety/medical concerns ? ?Assist level: Contact Guard/Touching assist ?Assistive device: Cane-quad  ? ?Walk 50 feet activity ? ? ?Assist Walk 50 feet with 2 turns activity did not occur: Safety/medical  concerns ? ?Assist level: Contact Guard/Touching assist ?Assistive device: Cane-quad  ? ? ?Walk 150 feet activity ? ? ?Assist Walk 150 feet activity did not occur: Safety/medical concerns ? ?  ?  ?  ? ?Walk 10 feet on uneven surface  ?activity ? ? ?Assist Walk 10 feet on uneven surfaces activity did not occur: Safety/medical concerns ? ? ?  ?   ? ?Wheelchair ? ? ? ? ?Assist Is the patient using a wheelchair?: Yes ?Type of Wheelchair: Manual ?  ? ?Wheelchair assist level: Supervision/Verbal cueing ?Max wheelchair distance: 150'  ? ? ?Wheelchair 50 feet with 2 turns activity ? ? ? ?Assist ? ?  ?  ? ? ?Assist Level: Supervision/Verbal cueing  ? ?Wheelchair 150 feet activity  ? ? ? ?Assist ?   ? ? ?Assist Level: Supervision/Verbal cueing  ? ?Blood pressure 138/63, pulse (!) 54, temperature 97.8 ?F (36.6 ?C), resp. rate 17, height 5\' 7"  (1.702 m), weight 81.5 kg, SpO2 94 %. ? ?Medical Problem List and Plan: ?1. Functional deficits secondary to fall with multiple fractures ?            -patient may shower but incisions must be covered.  ?            -ELOS/Goals: 5/19 Mod I min A ?         -Continue CIR therapies including PT, OT  ? 5/7 -Pt is limited by bilateral UE NWB. She indicates that there is not someone at home to help with mobility. I reviewed intra-articular left radial head fx. It is minimal/non-displaced.  ? 5/8 Ortho Dr. 7/8 reports pt may do any activity with LUE.   ? 5/11 TDC target 5/19, working on safe discharge plan as she doesn't have 24/7 care at home ? -Weightbearing status upgraded by ortho 5/12 ?2.  Antithrombotics: ?-DVT/anticoagulation:  Pharmaceutical: Other (comment) aspirin 325 mg daily ?            -antiplatelet therapy: Aspirin 325 mg daily ?3. Postoperative pain: continue Tylenol, oxycodone as needed ? -5/7 pt reports pain is controlled ? -5/16  she reports she takes oxy 5mg  at home. She will try to use 10mg  tonight instead of 15mg  dose ?4. Depression/anxiety: CSW to evaluate and provide  support ?            -antipsychotic agents: n/a ?            -continue Lexapro ? -continue low-dose Xanax as needed for anxiety ? -Reports mood is good overall 5/11 ?5. Neuropsych: This patient is capable of making decisions on her own behalf. ?6. Skin/Wound Care: Routine skin care checks ?            -- Monitor RUE surgical incision once splint is removed; has nylon sutures ? -Splint appears to be wrapped appropriately and intact.  ?7.  Fluids/Electrolytes/Nutrition: Routine Is and Os and follow-up chemistries ?8. Right olecranon fracture s/p ORIF ?            --NWB; splint in place, Nylon sutures ? -Ortho started her with R hinged elbow brace for motion 30-110, she should not use elbow for platform weightbearing ?9: Left tibial plateau fracture: touch down weight-bearing ?            -5/8 Ortho reports L Bledsoe leg brace to be locked in extension when ambulating, can be unlocked when in bed ? -5/13 Per Ortho LLE upgraded to WBAT Left leg with brace locked with weightbearing to assist with ambulation ? -5/16 Xray L knee ordered ?10: Left radial head fracture:  ?-Ortho Dr. Steward DroneBokshan reports pt may do any activity with LUE.   ?11: DM2: continue SSI AC/HS and CBGs ? CBG (last 3)  ?Recent Labs  ?  12/22/21 ?1655 12/22/21 ?2048 12/23/21 ?[id number]  ?GLUCAP 130* 191* 126*  ? ?             5/7 improvement in cbg's yesterday ? -continueTradjenta in place of patient's Januvia that she uses at home ? 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 She agrees to take this medication knowing its similar to [place]uvia ?5/11 Glucose control improved today, she did not take tradjenta, agrees to take ?5/16 She has been using tradjenta last few days, glucose control fair, continue to monitor ?12: ABLA: improving ? -5/8 CBC 11.3, improved continue to monitor ?13:  Constipation: is chronic;  Stools softeners not usually effective ? -5/11 schedule miralax ? -5/14 BM yesterday, continue regimen ?14: Hypoalbuminemia: continue protein supplements ?15: Tobacco use: cessation counseling; continue N

## 2021-12-23 NOTE — Progress Notes (Addendum)
Occupational Therapy Session Note ? ?Patient Details  ?Name: Alexandra Parsons ?MRN: [id number] ?Date of Birth: May 18, 1961 ? ?Today's Date: 12/23/2021 ?OT Individual Time: 1368-5992 ?OT Individual Time Calculation (min): 60 min  ? ? ?Short Term Goals: ?Week 1:  OT Short Term Goal 1 (Week 1): Pt will complete toileting with supervision ?OT Short Term Goal 1 - Progress (Week 1): Progressing toward goal ?OT Short Term Goal 2 (Week 1): Pt will complete bathroom transfers with supervision while adhering to [facility] precautions ?OT Short Term Goal 2 - Progress (Week 1): Progressing toward goal ?OT Short Term Goal 3 (Week 1): PT will complete LB dressing with supervision ?OT Short Term Goal 3 - Progress (Week 1): Progressing toward goal ?Week 2:  OT Short Term Goal 1 (Week 2): STGs = LTGs ? ?Skilled Therapeutic Interventions/Progress Updates:  ?  Pt received resting in bed. She was hesitant to do much ambulation due to pain in LLE.  ? ?Applied shoulder strap to R elbow splint and this does help prevent pt from wanting to push up with her arm as it is close to her abdoment. ? ? Pt did ambulate to bathroom to toilet and completed tasks and mobility with supervision.  Used wc to transport pt to ADL apt.  ?In apt, spent time discussing and demonstrating strategies for meal prep/ kitchen management from both ambulation and wc level. ? ?Pt sat at kitchen table to do AROM of RUE with brace on with ROM set at 30-110.  Pt tolerated movement very well with doing table top gravity eliminated sh and elb ROM.  No pain today and she said her shoulder was no longer "crunching" when she moved it. ? ?Pt returned to room, returned to bed with all needs met.  ? ?Therapy Documentation ?Precautions:  ?Precautions ?Precautions: Fall ?Required Braces or Orthoses: Other Brace ?Knee Immobilizer - Left: On when out of bed or walking (bledsoe (locked in exten for gait); unlocked otherwise) ?Other Brace: Bledsoe for RUE locked 0-30  degrees ?Restrictions ?Weight Bearing Restrictions: Yes ?RUE Weight Bearing: Non weight bearing ?LUE Weight Bearing: Weight bearing as tolerated ?LLE Weight Bearing: Weight bearing as tolerated ?Other Position/Activity Restrictions: "She can do anything with her left arm. She can platform, she can use crutches, any activity as tolerated." Per ortho MD 5.8 ? ?Pain: pt stated she felt a "pop" in her L knee when getting back to bed earlier today and now her leg was hurting 8/10 - RN and MD aware ?  ?ADL: ?ADL ?Eating: Independent ?Where Assessed-Eating: Bed level ?Grooming: Independent ?Where Assessed-Grooming: Wheelchair ?Upper Body Bathing: Supervision/safety (using long sponge to reach under L arm) ?Where Assessed-Upper Body Bathing: Shower ?Lower Body Bathing: Supervision/safety ?Where Assessed-Lower Body Bathing: Shower ?Upper Body Dressing: Setup ?Where Assessed-Upper Body Dressing: Edge of bed ?Lower Body Dressing: Supervision/safety ?Where Assessed-Lower Body Dressing: Edge of bed ?Toileting: Supervision/safety ?Where Assessed-Toileting: Toilet ?Toilet Transfer: Close supervision ?Toilet Transfer Method: Ambulating ?Science writer: Grab bars ?Tub/Shower Transfer: Close supervison ?Tub/Shower Transfer Method: Ambulating ?Tub/Shower Equipment: Radio broadcast assistant ?Walk-In Shower Transfer: Close supervision ?Walk-In Shower Transfer Method: Ambulating ?Walk-In Shower Equipment: Radio broadcast assistant ?ADL Comments: used quad cane ? ? ?Therapy/Group: Individual Therapy ? ?[place] ?12/23/2021, 1:01 PM ?

## 2021-12-23 NOTE — Progress Notes (Signed)
Occupational Therapy Session Note ? ?Patient Details  ?Name: Alexandra Parsons ?MRN: [id number] ?Date of Birth: 1961/06/29 ? ?Today's Date: 12/23/2021 ?OT Individual Time: 6270-3500 ?OT Individual Time Calculation (min): 25 min  ? ? ?Short Term Goals: ?Week 2:  OT Short Term Goal 1 (Week 2): STGs = LTGs ? ?Skilled Therapeutic Interventions/Progress Updates:  ?Skilled OT intervention completed with focus on ambulatory endurance and balance. Pt received seated on BSC with NT present assisting pt with set up A of toilet paper. Knee extension brace was noted to be off of pt during toileting/toilet transfer back to bed, with pt stating "I know I'm supposed to have it on but I had it unbuckled from the xray."  ? ?Donned R arm sling for brace and LLE knee extension brace with supervision. Therapist adjusting R arm brace to ensure accuracy of placement at elbow joint for secure AROM availability. Retrieved new grip socks per request with set up A for RLE and min A for LLE 2/2 knee locked in extension. Completed sit > stand using quad cane with supervision, then ambulated from room > gym with intermittent seated rest break then ambulated back to room to EOB. Required supervision up to light min A during ambulation as pt did have 3 LOB towards R side. Pt did report slight increase in pain in L knee, with request to rest in bed at end of session. Pt declined therapist retrieving w/c for pain management or meds, but had improved pain with LLE supported in bed. Pt was left upright in bed, with bed alarm on and all needs in reach at end of session.  ? ? ?Therapy Documentation ?Precautions:  ?Precautions ?Precautions: Fall ?Required Braces or Orthoses: Other Brace ?Knee Immobilizer - Left: On when out of bed or walking (bledsoe (locked in exten for gait); unlocked otherwise) ?Other Brace: Bledsoe for RUE locked 0-30 degrees ?Restrictions ?Weight Bearing Restrictions: Yes ?RUE Weight Bearing: Non weight bearing ?LUE Weight Bearing:  Weight bearing as tolerated ?LLE Weight Bearing: Weight bearing as tolerated ?Other Position/Activity Restrictions: "She can do anything with her left arm. She can platform, she can use crutches, any activity as tolerated." Per ortho MD 5.8 ? ? ? ?Therapy/Group: Individual Therapy ? ?Ellajane Stong E Justinian Miano ?12/23/2021, 7:39 AM ?

## 2021-12-23 NOTE — Progress Notes (Signed)
Patient ID: Alexandra Parsons, female   DOB: [birth year], 61 y.o.   MRN: [id number]  Spoke with Kim-Workers Comp CM to make sure she received the information emailed to her yesterday regarding pt's change in discharge disposition back to home. She has sent equipment to see which vendors can supply and awaiting adjuster to approve home health. She will update pt and this worker when hears something regarding both. Aware pt can not be discharged until has her equipment. Will do another work note since previous one is expiring. ?

## 2021-12-24 LAB — GLUCOSE, CAPILLARY
Glucose-Capillary: 105 mg/dL — ABNORMAL HIGH (ref 70–99)
Glucose-Capillary: 102 mg/dL — ABNORMAL HIGH (ref 70–99)
Glucose-Capillary: 148 mg/dL — ABNORMAL HIGH (ref 70–99)
Glucose-Capillary: 123 mg/dL — ABNORMAL HIGH (ref 70–99)

## 2021-12-24 NOTE — Progress Notes (Signed)
Physical Therapy Discharge Summary  Patient Details  Name: Alexandra Parsons MRN: [id number] Date of Birth: [birth year]  Patient has met 10 of 10 long term goals due to improved activity tolerance, improved balance, improved postural control, increased strength, increased range of motion, decreased pain, and ability to compensate for deficits.  Patient to discharge at a wheelchair level Modified Independent.   Patient's care partner unavailable to provide the necessary physical assistance at discharge. Family did not participate in family education prior to dc. Patient is living alone, but able to instruct care providers on weight bearing precautions as needed.    Recommendation:  Patient will benefit from ongoing skilled PT services in home health setting to continue to advance safe functional mobility, address ongoing impairments in gait progressions, dynamic balance, and minimize fall risk.  Equipment: SBQC, wc, wc cushion, anti tippers, elevating leg rest+ standard leg rest  Reasons for discharge: treatment goals met and discharge from hospital  Patient/family agrees with progress made and goals achieved: Yes  PT Discharge Precautions/Restrictions Precautions Precautions: Fall Required Braces or Orthoses: Other Brace Knee Immobilizer - Left: On when out of bed or walking Other Brace: Bledsoe for RUE locked 0-30 degrees Restrictions Weight Bearing Restrictions: Yes RUE Weight Bearing: Non weight bearing LUE Weight Bearing: Weight bearing as tolerated RLE Weight Bearing: Weight bearing as tolerated LLE Weight Bearing: Weight bearing as tolerated  Pain Pain Assessment Pain Scale: 0-10 Pain Score: 4  Pain Type: Acute pain Pain Location: Generalized Pain Orientation: Right;Left Pain Descriptors / Indicators: Aching;Tender;Pressure;Discomfort Pain Onset: On-going Patients Stated Pain Goal: 2 Pain Intervention(s): Medication (See eMAR);Cold applied;Pain med given for lower pain  score than stated, per patient request;RN made aware;Relaxation;Rest;Repositioned Pain Interference Pain Interference Pain Effect on Sleep: 2. Occasionally Pain Interference with Therapy Activities: 2. Occasionally Pain Interference with Day-to-Day Activities: 2. Occasionally Vision/Perception  Vision - History Ability to See in Adequate Light: 0 Adequate Perception Perception: Within Functional Limits Praxis Praxis: Intact  Cognition Overall Cognitive Status: Within Functional Limits for tasks assessed Arousal/Alertness: Awake/alert Orientation Level: Oriented X4 Sustained Attention: Appears intact Memory: Appears intact Awareness: Appears intact Problem Solving: Appears intact Safety/Judgment: Appears intact Sensation Sensation Light Touch: Appears Intact Hot/Cold: Appears Intact Proprioception: Appears Intact Stereognosis: Appears Intact Coordination Gross Motor Movements are Fluid and Coordinated: No Fine Motor Movements are Fluid and Coordinated: No Heel Shin Test: limited AROM due to pain and Bledsoe brace Motor  Motor Motor: Other (comment) Motor - Discharge Observations: antalgic related to fractures  Mobility Bed Mobility Bed Mobility: Rolling Right;Rolling Left;Supine to Sit;Sit to Supine Rolling Right: Independent Rolling Left: Independent Supine to Sit: Independent Sit to Supine: Independent Transfers Transfers: Sit to Stand;Stand to Sit;Stand Pivot Transfers Sit to Stand: Independent with assistive device Stand to Sit: Independent with assistive device Stand Pivot Transfers: Independent with assistive device Transfer (Assistive device): Small based quad cane Locomotion  Gait Ambulation: Yes Gait Assistance: Independent with assistive device Gait Distance (Feet): 200 Feet Assistive device: Small based quad cane Gait Gait: Yes Gait Pattern: Impaired Gait Pattern: Step-to pattern;Decreased step length - right;Decreased step length - left;Lateral  trunk lean to left Stairs / Additional Locomotion Stairs: Yes Stairs Assistance: Supervision/Verbal cueing Stair Management Technique: With cane Number of Stairs: 12 Height of Stairs: 6 Ramp: Supervision/Verbal cueing Curb: Supervision/Verbal cueing Pick up small object from the floor assist level: Independent with assistive device Pick up small object from the floor assistive device: Art gallery manager Mobility: Yes Wheelchair Assistance: Independent with assistive device Wheelchair  Propulsion: Right lower extremity;Left upper extremity Wheelchair Parts Management: Independent Distance: 150  Trunk/Postural Assessment  Cervical Assessment Cervical Assessment: Within Functional Limits Thoracic Assessment Thoracic Assessment: Within Functional Limits Lumbar Assessment Lumbar Assessment: Within Functional Limits Postural Control Postural Control: Deficits on evaluation  Balance Balance Balance Assessed: Yes Static Sitting Balance Static Sitting - Balance Support: Feet supported Static Sitting - Level of Assistance: 7: Independent Dynamic Sitting Balance Dynamic Sitting - Balance Support: Feet supported Dynamic Sitting - Level of Assistance: 7: Independent Static Standing Balance Static Standing - Balance Support: Left upper extremity supported Static Standing - Level of Assistance: 6: Modified independent (Device/Increase time) Dynamic Standing Balance Dynamic Standing - Balance Support: Left upper extremity supported Dynamic Standing - Level of Assistance: 6: Modified independent (Device/Increase time) Extremity Assessment      RLE Assessment RLE Assessment: Within Functional Limits LLE Assessment LLE Assessment: Exceptions to [facility] General Strength Comments: grossly 3/5, no resistance applied due to pain    Karoline Caldwell, PT, DPT, CBIS  12/25/2021, 2:10 PM

## 2021-12-24 NOTE — Progress Notes (Addendum)
?                                                       PROGRESS NOTE ? ? ?Subjective/Complaints: ?No new concerns or complaints this AM.  ? ? ?Review of Systems  ?Respiratory:  Negative for shortness of breath.   ?Cardiovascular:  Negative for chest pain.  ?Gastrointestinal:  Negative for constipation, nausea and vomiting.  ?Genitourinary:  Positive for frequency and urgency. Negative for dysuria, flank pain and hematuria.  ?Musculoskeletal:  Positive for joint pain. Negative for back pain, falls and neck pain.   ? ? ?Objective: ?  ?DG Knee Left Port ? ?Result Date: 12/23/2021 ?CLINICAL DATA:  Pain. EXAM: PORTABLE LEFT KNEE - 1-2 VIEW COMPARISON:  Left knee x-ray 12/07/2021. FINDINGS: No evidence of fracture or dislocation. No evidence of arthropathy or other focal bone abnormality. Suprapatellar soft tissue swelling and joint effusion are present, but have decreased. IMPRESSION: 1. Suprapatellar soft tissue swelling and joint effusion have decreased, but have not completely resolved. 2. No acute fracture or dislocation identified. Electronically Signed   By: Darliss Cheney M.D.   On: 12/23/2021 15:12   ?Recent Labs  ?  12/22/21 ?0702  ?WBC 7.1  ?HGB 10.6*  ?HCT 32.1*  ?PLT 242  ? ? ?Recent Labs  ?  12/22/21 ?0702  ?NA 136  ?K 4.0  ?CL 104  ?CO2 27  ?GLUCOSE 116*  ?BUN 14  ?CREATININE 0.92  ?CALCIUM 8.7*  ? ? ? ?Intake/Output Summary (Last 24 hours) at 12/24/2021 0742 ?Last data filed at 12/24/2021 0735 ?Gross per 24 hour  ?Intake 1257 ml  ?Output --  ?Net 1257 ml  ? ?  ? ?  ? ?Physical Exam: ?Vital Signs ?Blood pressure (!) 146/66, pulse (!) 55, temperature 98 ?F (36.7 ?C), resp. rate 16, height 5\' 7"  (1.702 m), weight 81.5 kg, SpO2 98 %. ? ? ?Constitutional: No distress . Vital signs reviewed. ?HEENT: NCAT, EOMI, oral membranes moist ?Neck: supple ?Cardiovascular: RRR without murmur. No JVD    ?Respiratory/Chest: CTA Bilaterally without wheezes or rales. Normal effort    ?GI/Abdomen: BS +, non-tender,  non-distended ?Ext: no clubbing, cyanosis, or edema ?Psych: pleasant and cooperative  ?Skin: warm and dry, no breakdown noted ?Neuro:  Alert and oriented. Follows commands.  CN 2-12 intact. Intact sensation in all 4 to LT. Moving all 4 extremities. ?Musculoskeletal: Right hinged elbow brace. Hinged bledsoe knee brace LLE ?Sensation intact to all fingers of right hand. Right UE tender with PROM/AROM ?Tenderness around L knee to palpation. No signs of infection ? ? ?Assessment/Plan: ?1. Functional deficits which require 3+ hours per day of interdisciplinary therapy in a comprehensive inpatient rehab setting. ?Physiatrist is providing close team supervision and 24 hour management of active medical problems listed below. ?Physiatrist and rehab team continue to assess barriers to discharge/monitor patient progress toward functional and medical goals ? ?Care Tool: ? ?Bathing ?   ?Body parts bathed by patient: Chest, Abdomen, Front perineal area, Buttocks, Right upper leg, Left upper leg, Right lower leg, Face  ? Body parts bathed by helper: Left arm ?Body parts n/a: Left lower leg ?  ?Bathing assist Assist Level: Minimal Assistance - Patient > 75% ?  ?  ?Upper Body Dressing/Undressing ?Upper body dressing   ?What is the patient wearing?: Pull over shirt, Bra ?   ?  Upper body assist Assist Level: Minimal Assistance - Patient > 75% ?   ?Lower Body Dressing/Undressing ?Lower body dressing ? ? ?   ?What is the patient wearing?: Incontinence brief, Pants ? ?  ? ?Lower body assist Assist for lower body dressing: Moderate Assistance - Patient 50 - 74% ?   ? ?Toileting ?Toileting    ?Toileting assist Assist for toileting: Supervision/Verbal cueing ?  ?  ?Transfers ?Chair/bed transfer ? ?Transfers assist ?   ? ?Chair/bed transfer assist level: Contact Guard/Touching assist ?  ?  ?Locomotion ?Ambulation ? ? ?Ambulation assist ? ? Ambulation activity did not occur: Safety/medical concerns ? ?Assist level: Contact Guard/Touching  assist ?Assistive device: Cane-quad ?Max distance: 100  ? ?Walk 10 feet activity ? ? ?Assist ? Walk 10 feet activity did not occur: Safety/medical concerns ? ?Assist level: Supervision/Verbal cueing ?Assistive device: Cane-quad  ? ?Walk 50 feet activity ? ? ?Assist Walk 50 feet with 2 turns activity did not occur: Safety/medical concerns ? ?Assist level: Contact Guard/Touching assist ?Assistive device: Cane-quad  ? ? ?Walk 150 feet activity ? ? ?Assist Walk 150 feet activity did not occur: Safety/medical concerns ? ?Assist level: Supervision/Verbal cueing ?  ?  ? ?Walk 10 feet on uneven surface  ?activity ? ? ?Assist Walk 10 feet on uneven surfaces activity did not occur: Safety/medical concerns ? ? ?Assist level: Contact Guard/Touching assist ?Assistive device: Cane-quad  ? ?Wheelchair ? ? ? ? ?Assist Is the patient using a wheelchair?: No ?Type of Wheelchair: Manual ?  ? ?Wheelchair assist level: Supervision/Verbal cueing ?Max wheelchair distance: 150'  ? ? ?Wheelchair 50 feet with 2 turns activity ? ? ? ?Assist ? ?  ?  ? ? ?Assist Level: Supervision/Verbal cueing  ? ?Wheelchair 150 feet activity  ? ? ? ?Assist ?   ? ? ?Assist Level: Supervision/Verbal cueing  ? ?Blood pressure (!) 146/66, pulse (!) 55, temperature 98 ?F (36.7 ?C), resp. rate 16, height 5\' 7"  (1.702 m), weight 81.5 kg, SpO2 98 %. ? ?Medical Problem List and Plan: ?1. Functional deficits secondary to fall with multiple fractures ?            -patient may shower but incisions must be covered.  ?            -ELOS/Goals: 5/19 Mod I min A ?            -Continue CIR therapies including PT, OT  ? -5/8 Ortho Dr. 7/8 reports pt may do any activity with LUE.   ? -5/11 TDC target 5/19, working on safe discharge plan as she doesn't have 24/7 care at home ? -Weightbearing status upgraded by ortho 5/12 ? -Team conference today ?2.  Antithrombotics: ?-DVT/anticoagulation:  Pharmaceutical: Other (comment) aspirin 325 mg daily ?            -antiplatelet  therapy: Aspirin 325 mg daily ?3. Postoperative pain: continue Tylenol, oxycodone as needed ? -5/7 pt reports pain is controlled ? -5/16  she reports she takes oxy 5mg  at home. She will try to use 10mg  tonight instead of 15mg  dose ?4. Depression/anxiety: CSW to evaluate and provide support ?            -antipsychotic agents: n/a ?            -continue Lexapro ? -continue low-dose Xanax as needed for anxiety ? -Reports mood is good overall 5/11 ?5. Neuropsych: This patient is capable of making decisions on her own behalf. ?6. Skin/Wound Care: Routine skin care  checks ?            -- Monitor RUE surgical incision once splint is removed; has nylon sutures ? -Splint appears to be wrapped appropriately and intact.  ?7. Fluids/Electrolytes/Nutrition: Routine Is and Os and follow-up chemistries ?8. Right olecranon fracture s/p ORIF ?            --NWB; splint in place, Nylon sutures ? -Ortho started her with R hinged elbow brace for motion 30-110, she should not use elbow for platform weightbearing ?9: Left tibial plateau fracture: touch down weight-bearing ?            -5/8 Ortho reports L Bledsoe leg brace to be locked in extension when ambulating, can be unlocked when in bed ? -5/13 Per Ortho LLE upgraded to WBAT Left leg with brace locked with weightbearing to assist with ambulation ? -5/17 Xray left knee with no acute changes ?10: Left radial head fracture:  ?-Ortho Dr. Steward DroneBokshan reports pt may do any activity with LUE.   ?11: DM2: continue SSI AC/HS and CBGs ? CBG (last 3)  ?Recent Labs  ?  12/23/21 ?1629 12/23/21 ?2048 12/24/21 ?[id number]  ?GLUCAP 174* 117* 105*  ? ?             5/7 improvement in cbg's yesterday ? -continueTradjenta in place of patient's Januvia that she uses at home ? 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 She agrees to take  this medication knowing its similar to [place]uvia ?5/11 Glucose control improved today, she did not take tradjenta, agrees to take ?5/16 She has been using tradjenta last few days, glucose control fair, continue to monitor ?12: AB

## 2021-12-24 NOTE — Progress Notes (Signed)
Occupational Therapy Session Note ? ?Patient Details  ?Name: Alexandra Parsons ?MRN: [id number] ?Date of Birth: Mar 27, 1961 ? ?Today's Date: 12/24/2021 ?OT Individual Time: 0093-8182 ?OT Individual Time Calculation (min): 45 min  ? ? ?Short Term Goals: ?Week 1:  OT Short Term Goal 1 (Week 1): Pt will complete toileting with supervision ?OT Short Term Goal 1 - Progress (Week 1): Progressing toward goal ?OT Short Term Goal 2 (Week 1): Pt will complete bathroom transfers with supervision while adhering to [facility] precautions ?OT Short Term Goal 2 - Progress (Week 1): Progressing toward goal ?OT Short Term Goal 3 (Week 1): PT will complete LB dressing with supervision ?OT Short Term Goal 3 - Progress (Week 1): Progressing toward goal ? ?Skilled Therapeutic Interventions/Progress Updates:  ?   ?Pt received in w/c with no pain reported. ? ?Therapeutic activity ?Pt requesting resources on meditation and breathing as a way to reduce anxiety. ? ?C-A-L-M (chest, arms, legs, mind) meditation/body scan provided for pain/anxiety management with education regarding breathwork to stimulate the parasympathetic nervous system.  ?Pt educated on breathework activity to improve presence, stimulate parasympathetic nervous system, attention and reduce anxiety: take five  ?Take 5 breathes tracing index finger along alternate palm- inhale trace finger up, exhale trace finger back to starting point. With next breathe move on to next finger. ? ?Provided resources: ?Meditation Resources: ?MOST IMPORTANT- you cant do it right! You are in charge of your comfort and positioning.  ? ?Love Your Brain- [employer] ?https://www.loveyourbrain.com/meditation/#library  ? ?Pendulation Meditation for pain management ?Do a body scan feeling each body part locating a part that may be in pain  ?Locate a body part that feels good- can be general ?my back? or very specific like ?my Right big toe? ?Shift your attention back and forth until you feel the pain ease  off approaching the body part that is in pain like a compfire- get just close enough to the pain to feel it but not to get ?burned?/overwhelmed ?Insight Timer App ?Free meditations  ?Free white noise/brown noise playlists ?Has a sleep feature to listen to a meditation prior to bed time without needing to worry to turn off your phone ?Yoga nidra ?Yoga ?sleep? ?Explores different levels of consciousness that lets your dip into a meditative space and REST! ? ? ?Pt left at end of session in w/c with exit alarm on, call light in reach and all needs met ? ? ?Therapy Documentation ?Precautions:  ?Precautions ?Precautions: Fall ?Required Braces or Orthoses: Other Brace ?Knee Immobilizer - Left: On when out of bed or walking (bledsoe (locked in exten for gait); unlocked otherwise) ?Other Brace: Bledsoe for RUE locked 0-30 degrees ?Restrictions ?Weight Bearing Restrictions: Yes ?RUE Weight Bearing: Non weight bearing ?LUE Weight Bearing: Weight bearing as tolerated ?LLE Weight Bearing: Weight bearing as tolerated ?Other Position/Activity Restrictions: "She can do anything with her left arm. She can platform, she can use crutches, any activity as tolerated." Per ortho MD 5.8 ?General: ?  ? ?Therapy/Group: Individual Therapy ? ?Lowella Dell Tuere Nwosu ?12/24/2021, 6:54 AM ?

## 2021-12-24 NOTE — Progress Notes (Signed)
Occupational Therapy Session Note ? ?Patient Details  ?Name: Alexandra Parsons ?MRN: [id number] ?Date of Birth: April 23, 1961 ? ?Today's Date: 12/24/2021 ?OT Individual Time: 3403-5248 ?OT Individual Time Calculation (min): 40 min  ? ? ?Short Term Goals: ?Week 1:  OT Short Term Goal 1 (Week 1): Pt will complete toileting with supervision ?OT Short Term Goal 1 - Progress (Week 1): Progressing toward goal ?OT Short Term Goal 2 (Week 1): Pt will complete bathroom transfers with supervision while adhering to [facility] precautions ?OT Short Term Goal 2 - Progress (Week 1): Progressing toward goal ?OT Short Term Goal 3 (Week 1): PT will complete LB dressing with supervision ?OT Short Term Goal 3 - Progress (Week 1): Progressing toward goal ?Week 2:  OT Short Term Goal 1 (Week 2): STGs = LTGs ? ?Skilled Therapeutic Interventions/Progress Updates:  ?  Pt received in bed stating she had just gotten clean clothing on and used the bathroom with nursing.  Pt stated she was hurting but thought the pain meds had just not kicked in yet.  ?Pt agreeable to sitting up to don shoes and walk to sink to stand to do oral care and brush hair. Needs A to place ponytail holder.   ?Under her brace the ACE wrap was coming unfurled.   ?Pt sat at EOB to place arm on table. Removed elbow brace (it had slid down so pivot point too distal).  Rewrapped ACE wrap, adjusted splint higher and placed 2 layers of foam padding under distal strap to give her more cushion and prevent brace from sliding. ? ?Pt feeling LE pain and opted to lay down to rest prior to PT session.  In bed with all needs met and alarm set.  ? ?Therapy Documentation ?Precautions:  ?Precautions ?Precautions: Fall ?Required Braces or Orthoses: Other Brace ?Knee Immobilizer - Left: On when out of bed or walking ?Other Brace: Bledsoe for RUE locked 0-30 degrees ?Restrictions ?Weight Bearing Restrictions: Yes ?RUE Weight Bearing: Non weight bearing ?LUE Weight Bearing: Weight bearing as  tolerated ?RLE Weight Bearing: Weight bearing as tolerated ?LLE Weight Bearing: Weight bearing as tolerated ?Other Position/Activity Restrictions: "She can do anything with her left arm. She can platform, she can use crutches, any activity as tolerated." Per ortho MD 5.8 ? ?  ? ? ?Pain: ?Pain Assessment ?Pain Score: 4  ?Pain Type: Acute pain ?Pain Location: Generalized ?Pain Orientation: Right;Left ?Pain Descriptors / Indicators: Aching;Pressure;Nagging;Discomfort ?Pain Onset: On-going ?Patients Stated Pain Goal: 2 ?Pain Intervention(s): Medication (See eMAR);Repositioned;Relaxation ?ADL: ?ADL ?Eating: Independent ?Where Assessed-Eating: Chair ?Grooming: Independent ?Where Assessed-Grooming: Standing at sink ?Upper Body Bathing: Supervision/safety (using long sponge to reach under L arm) ?Where Assessed-Upper Body Bathing: Shower ?Lower Body Bathing: Supervision/safety ?Where Assessed-Lower Body Bathing: Shower ?Upper Body Dressing: Modified independent (Device) ?Where Assessed-Upper Body Dressing: Edge of bed ?Lower Body Dressing: Supervision/safety ?Where Assessed-Lower Body Dressing: Edge of bed ?Toileting: Supervision/safety ?Where Assessed-Toileting: Toilet ?Toilet Transfer: Close supervision ?Toilet Transfer Method: Ambulating ?Science writer: Grab bars ?Tub/Shower Transfer: Close supervison ?Tub/Shower Transfer Method: Ambulating ?Tub/Shower Equipment: Radio broadcast assistant ?Walk-In Shower Transfer: Close supervision ?Walk-In Shower Transfer Method: Ambulating ?Walk-In Shower Equipment: Radio broadcast assistant ?ADL Comments: used quad cane ? ? ?Therapy/Group: Individual Therapy ? ?[place] ?12/24/2021, 10:08 AM ?

## 2021-12-24 NOTE — Progress Notes (Signed)
Physical Therapy Session Note ? ?Patient Details  ?Name: Alexandra Parsons ?MRN: [id number] ?Date of Birth: March 13, 1961 ? ?Today's Date: 12/24/2021 ?PT Individual Time: 5366-4403 ?PT Individual Time Calculation (min): 40 min  ? ?Short Term Goals: ?Week 2:  PT Short Term Goal 1 (Week 2): STG= LTG based on ELOS ? ?Skilled Therapeutic Interventions/Progress Updates:  ?  Patient received sitting up in bed, agreeable to PT. She reports pain in her L LE, did not rate- premedicated. PT providing rest breaks, distractions and repositioning to assist with pain management. Ambulatory transfer to wc with [facility] supervision. PT transporting patient in wc to therapy gym for time management and energy conservation. She was able to ambulate to the stairs with [facility] and supervision-negotiating 12steps with [facility] and supervision. Patient transferring into car with [facility] and supervision as well. Patient completing the following therex on R LE with 3# ankle weight: hip flexion, HSC, LAQ. Patient propelling herself in wc back to her room using L UE and R LE with supervision. Remaining up in chair, call light within reach.  ? ?Therapy Documentation ?Precautions:  ?Precautions ?Precautions: Fall ?Required Braces or Orthoses: Other Brace ?Knee Immobilizer - Left: On when out of bed or walking (bledsoe (locked in exten for gait); unlocked otherwise) ?Other Brace: Bledsoe for RUE locked 0-30 degrees ?Restrictions ?Weight Bearing Restrictions: Yes ?RUE Weight Bearing: Non weight bearing ?LUE Weight Bearing: Weight bearing as tolerated ?LLE Weight Bearing: Weight bearing as tolerated ?Other Position/Activity Restrictions: "She can do anything with her left arm. She can platform, she can use crutches, any activity as tolerated." Per ortho MD 5.8 ? ? ? ?Therapy/Group: Individual Therapy ? ?Westley Foots ?Westley Foots, PT, DPT, CBIS ? ?12/24/2021, 7:40 AM  ?

## 2021-12-24 NOTE — Progress Notes (Addendum)
Patient ID: Alexandra Parsons, female   DOB: May 23, 1961, 61 y.o.   MRN: [id number]  Spoke with [employer] CM to get update on equipment reported still working on it. Will update when hears anything aware discharge dependent upon equipment being delivered. ? ?2:25 PM Met with pt to update regarding team conference progress in therapies and main issue is obtaining her equipment to go home with. Workers Comp also working on home health follow. Once this is completed pt can return home. ?

## 2021-12-25 ENCOUNTER — Other Ambulatory Visit (HOSPITAL_BASED_OUTPATIENT_CLINIC_OR_DEPARTMENT_OTHER): Payer: Self-pay

## 2021-12-25 DIAGNOSIS — N3000 Acute cystitis without hematuria: Secondary | ICD-10-CM

## 2021-12-25 LAB — GLUCOSE, CAPILLARY
Glucose-Capillary: 117 mg/dL — ABNORMAL HIGH (ref 70–99)
Glucose-Capillary: 118 mg/dL — ABNORMAL HIGH (ref 70–99)
Glucose-Capillary: 120 mg/dL — ABNORMAL HIGH (ref 70–99)
Glucose-Capillary: 126 mg/dL — ABNORMAL HIGH (ref 70–99)

## 2021-12-25 NOTE — Progress Notes (Signed)
Occupational Therapy Session Note  Patient Details  Name: Alexandra Parsons MRN: [id number] Date of Birth: 1961/04/07  Today's Date: 12/25/2021 OT Individual Time: 0900-1000 OT Individual Time Calculation (min): 60 min    Short Term Goals: Week 1:  OT Short Term Goal 1 (Week 1): Pt will complete toileting with supervision OT Short Term Goal 1 - Progress (Week 1): Progressing toward goal OT Short Term Goal 2 (Week 1): Pt will complete bathroom transfers with supervision while adhering to [facility] precautions OT Short Term Goal 2 - Progress (Week 1): Progressing toward goal OT Short Term Goal 3 (Week 1): PT will complete LB dressing with supervision OT Short Term Goal 3 - Progress (Week 1): Progressing toward goal Week 2:  OT Short Term Goal 1 (Week 2): STGs = LTGs  Skilled Therapeutic Interventions/Progress Updates:    Patient seen this day for OT services, pt was able to complete a simulated task in bathing and dressing at w/c LOF , she was S for bathing  and dressing UB and S to Mod I with LB for LB bathing and dressing incorporating the RW for balance when coming from sit to stand. The pt was able to complete toileting with S for task performance inclusive of clothing management. The pt completed functional mobility with ModI  using the 4 prong walker for additional balance.  The pt was instructed in HEP using 3lb dumb bell for bicep curls, shld flexion, and horizontal abduction.  The pt remained at w/c LOF in preparation of her next treatment session.  The call light and bed side table were within reach and the alarm was activated.  All additional needs were addressed.   Therapy Documentation Precautions:  Precautions Precautions: Fall Required Braces or Orthoses: Other Brace Knee Immobilizer - Left: On when out of bed or walking Other Brace: Bledsoe for RUE locked 0-30 degrees Restrictions Weight Bearing Restrictions: No RUE Weight Bearing: Non weight bearing LUE Weight Bearing: Weight  bearing as tolerated RLE Weight Bearing: Weight bearing as tolerated LLE Weight Bearing: Weight bearing as tolerated Other Position/Activity Restrictions: "She can do anything with her left arm. She can platform, she can use crutches, any activity as tolerated." Per ortho MD 5.8  Therapy/Group: Individual Therapy  [place] 12/25/2021, 1:26 PM

## 2021-12-25 NOTE — Progress Notes (Signed)
PROGRESS NOTE   Subjective/Complaints: Increased pain from decreased use of Oxycodone. Feeling better this AM.    Review of Systems  Respiratory:  Negative for shortness of breath.   Cardiovascular:  Negative for chest pain.  Gastrointestinal:  Negative for constipation, nausea and vomiting.  Genitourinary:  Positive for frequency and urgency. Negative for flank pain and hematuria.  Musculoskeletal:  Positive for joint pain. Negative for back pain, falls and neck pain.  Neurological:  Negative for sensory change.     Objective:   DG Knee Left Port  Result Date: 12/23/2021 CLINICAL DATA:  Pain. EXAM: PORTABLE LEFT KNEE - 1-2 VIEW COMPARISON:  Left knee x-ray 12/07/2021. FINDINGS: No evidence of fracture or dislocation. No evidence of arthropathy or other focal bone abnormality. Suprapatellar soft tissue swelling and joint effusion are present, but have decreased. IMPRESSION: 1. Suprapatellar soft tissue swelling and joint effusion have decreased, but have not completely resolved. 2. No acute fracture or dislocation identified. Electronically Signed   By: Darliss Cheney M.D.   On: 12/23/2021 15:12   No results for input(s): WBC, HGB, HCT, PLT in the last 72 hours.  No results for input(s): NA, K, CL, CO2, GLUCOSE, BUN, CREATININE, CALCIUM in the last 72 hours.   Intake/Output Summary (Last 24 hours) at 12/25/2021 0734 Last data filed at 12/24/2021 1859 Gross per 24 hour  Intake 474 ml  Output --  Net 474 ml         Physical Exam: Vital Signs Blood pressure (!) 150/60, pulse (!) 54, temperature 97.8 F (36.6 C), temperature source Oral, resp. rate 18, height 5\' 7"  (1.702 m), weight 81.5 kg, SpO2 98 %.   Constitutional: No distress . Vital signs reviewed. Sitting in chair with therapy. HEENT: NCAT, EOMI, oral membranes moist Neck: supple Cardiovascular: RRR without murmur. No JVD    Respiratory/Chest: CTA Bilaterally  without wheezes or rales. Normal effort    GI/Abdomen: BS +, non-tender, non-distended Ext: no clubbing, cyanosis, or edema Psych: pleasant and cooperative  Skin: warm and dry, no breakdown noted Neuro:  Alert and oriented. Follows commands.  CN 2-12 grossly intact. Intact sensation in all 4 to LT. Moving all 4 extremities. Musculoskeletal: Right hinged elbow brace. Hinged bledsoe knee brace LLE Sensation intact to all fingers of right hand. Right UE tender with PROM/AROM Tenderness around L knee to palpation. No signs of infection   Assessment/Plan: 1. Functional deficits which require 3+ hours per day of interdisciplinary therapy in a comprehensive inpatient rehab setting. Physiatrist is providing close team supervision and 24 hour management of active medical problems listed below. Physiatrist and rehab team continue to assess barriers to discharge/monitor patient progress toward functional and medical goals  Care Tool:  Bathing    Body parts bathed by patient: Chest, Abdomen, Front perineal area, Buttocks, Right upper leg, Left upper leg, Right lower leg, Face   Body parts bathed by helper: Left arm Body parts n/a: Left lower leg   Bathing assist Assist Level: Minimal Assistance - Patient > 75%     Upper Body Dressing/Undressing Upper body dressing   What is the patient wearing?: Pull over shirt, Bra    Upper body assist  Assist Level: Minimal Assistance - Patient > 75%    Lower Body Dressing/Undressing Lower body dressing      What is the patient wearing?: Incontinence brief, Pants     Lower body assist Assist for lower body dressing: Moderate Assistance - Patient 50 - 74%     Toileting Toileting    Toileting assist Assist for toileting: Supervision/Verbal cueing     Transfers Chair/bed transfer  Transfers assist     Chair/bed transfer assist level: Contact Guard/Touching assist     Locomotion Ambulation   Ambulation assist   Ambulation activity  did not occur: Safety/medical concerns  Assist level: Contact Guard/Touching assist Assistive device: Cane-quad Max distance: 100   Walk 10 feet activity   Assist  Walk 10 feet activity did not occur: Safety/medical concerns  Assist level: Supervision/Verbal cueing Assistive device: Cane-quad   Walk 50 feet activity   Assist Walk 50 feet with 2 turns activity did not occur: Safety/medical concerns  Assist level: Contact Guard/Touching assist Assistive device: Cane-quad    Walk 150 feet activity   Assist Walk 150 feet activity did not occur: Safety/medical concerns  Assist level: Supervision/Verbal cueing      Walk 10 feet on uneven surface  activity   Assist Walk 10 feet on uneven surfaces activity did not occur: Safety/medical concerns   Assist level: Contact Guard/Touching assist Assistive device: Cane-quad   Wheelchair     Assist Is the patient using a wheelchair?: No Type of Wheelchair: Manual    Wheelchair assist level: Supervision/Verbal cueing Max wheelchair distance: 150'    Wheelchair 50 feet with 2 turns activity    Assist        Assist Level: Supervision/Verbal cueing   Wheelchair 150 feet activity     Assist      Assist Level: Supervision/Verbal cueing   Blood pressure (!) 150/60, pulse (!) 54, temperature 97.8 F (36.6 C), temperature source Oral, resp. rate 18, height 5\' 7"  (1.702 m), weight 81.5 kg, SpO2 98 %.  Medical Problem List and Plan: 1. Functional deficits secondary to fall with multiple fractures             -patient may shower but incisions must be covered.              -ELOS/Goals: 5/19 Mod I             -Continue CIR therapies including PT, OT   -5/8 Ortho Dr. Steward DroneBokshan reports pt may do any activity with LUE.    -Team conference 5/17, DC date expected tomorrow  -Weightbearing status upgraded by ortho 5/12  -Team conference today 2.  Antithrombotics: -DVT/anticoagulation:  Pharmaceutical: Other  (comment) aspirin 325 mg daily             -antiplatelet therapy: Aspirin 325 mg daily 3. Postoperative pain: continue Tylenol, oxycodone as needed  -5/7 pt reports pain is controlled  -5/16  she reports she takes oxy 5mg  at home. She will try to use 10mg  tonight instead of 15mg  dose 4. Depression/anxiety: CSW to evaluate and provide support             -antipsychotic agents: n/a             -continue Lexapro  -continue low-dose Xanax as needed for anxiety  -Reports mood is good overall 5/11 5. Neuropsych: This patient is capable of making decisions on her own behalf. 6. Skin/Wound Care: Routine skin care checks             --  Monitor RUE surgical incision once splint is removed; has nylon sutures  -Splint appears to be wrapped appropriately and intact.  7. Fluids/Electrolytes/Nutrition: Routine Is and Os and follow-up chemistries 8. Right olecranon fracture s/p ORIF             --NWB; splint in place, Nylon sutures  -Ortho started her with R hinged elbow brace for motion 30-110, she should not use elbow for platform weightbearing 9: Left tibial plateau fracture: touch down weight-bearing             -5/8 Ortho reports L Bledsoe leg brace to be locked in extension when ambulating, can be unlocked when in bed  -5/13 Per Ortho LLE upgraded to WBAT Left leg with brace locked with weightbearing to assist with ambulation  -5/17 Xray left knee with no acute changes 10: Left radial head fracture:  -Ortho Dr. Steward Drone reports pt may do any activity with LUE.   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 Discussed this  is in similar class DPP-4 as [place]. She agrees to take this medication knowing its similar to [place] 5/11 Glucose control improved today, she did not take tradjenta, agrees to take 5/16 She has been using tradjenta last few days, glucose control fair, continue to monitor 5/18 Glucose well controlled overall, continue current treatment 12: ABLA: improving  -5/8 CBC 11.3, improved continue to monitor  -5/17 last hemoglobin 10.6 stable, continue to monitor 13: Constipation: is chronic;  Stools softeners not usually effective  -5/11 schedule miralax  -5/17 BM yesterday, constipation improved 14: Hypoalbuminemia: continue protein supplements 15: Tobacco use: cessation counseling; continue Nicoderm 16: Hypertension: continue Ziac  5/8 BP overall well controlled, continue to monitor  -5/13 BP a little low, will stop Ziac and monitor  -5/18 BP well controlled overall, A little higher this AM, likely pain related 17: Hyperlipidemia: continue Zetia, Crestor 18. UTI -U/A 5/9 with only small leukocytes, rare bacteria. Denies dysuria today just reports urinating  frequently but drinking a lot of water. Will monitor and repeat U/A and culture if symptoms of UTI develop. -5/15 100k ecoli sens to keflex. -Completes Keflex coarse tomorrow 5/19 19. Wrist pain and L thumb pain  -OT to try soft wrist and thumb wrap  -Will order lidocaine cream  -Consider voltaren gel if not improved 20. Chest wall pain  -5/12 Lidocaine patch    LOS: 13 days A FACE TO FACE EVALUATION WAS PERFORMED  Fanny Dance 12/25/2021, 7:34 AM

## 2021-12-25 NOTE — Discharge Instructions (Addendum)
Inpatient Rehab Discharge Instructions  Alexandra Parsons Discharge date and time: 12/26/21   Activities/Precautions/ Functional Status: Activity: no lifting, driving, or strenuous exercise for until cleared by MD Diet: diabetic diet Wound Care: keep wound clean and dry. Contact Dr. Bradd Canary if you develop any problems with your incision/wound--redness, swelling, increase in pain, drainage or if you develop fever or chills.     Functional status:  ___ No restrictions     ___ Walk up steps independently ___ 24/7 supervision/assistance   ___ Walk up steps with assistance _X__ Intermittent supervision/assistance  __X_ Bathe/dress independently ___ Walk with walker     ___ Bathe/dress with assistance ___ Walk Independently    ___ Shower independently ___ Walk with assistance    _X__ Shower with assistance _X__ No alcohol     ___ Return to work/school ________   Special Instructions:  No driving, alcohol consumption or tobacco use.  2.  WEIGHT BEARING AS TOLERATED ON LEFT LEG WITH BLEDSOE BRACE LOCKED IN EXTENSION. CAN BE UNLOCKED WHEN IN BED. 3. NO  weight on Right arm. No weight on Left arm but can use it for bathing/dressing/activity. 4. Wash with soap and water. Keep clean and dry.  5. Monitor blood sugars before meals and at bedtime. Resume metformin at 500  mg with breakfast and supper if blood sugars are over 120. If they remain elevated for 24 hour increase metformin to 1000 mg twice a day.    COMMUNITY REFERRALS UPON DISCHARGE:    Home Health:   PT & OT                Agency: [facility] HEALTH AGENCY UNSURE WHO IS YET?   [facility] WILL BE PROVIDING PT & OT  Medical Equipment/Items Ordered: WHEELCHAIR, SBQC, 3 IN 1 AND TUB BENCH                                                 Agency/Supplier:[facility]IA [employer]-TEXTING PATIENT REGARDING DELIVERY    My questions have been answered and I understand these instructions. I will adhere to these  goals and the provided educational materials after my discharge from the hospital.  Patient/Caregiver Signature _______________________________ Date __________  Clinician Signature _______________________________________ Date __________  Please bring this form and your medication list with you to all your follow-up doctor's appointments.

## 2021-12-25 NOTE — Progress Notes (Signed)
Recreational Therapy Session Note  Patient Details  Name: Alexandra Parsons MRN: [id number] Date of Birth: [birth year] Today's Date: 12/25/2021  Pain: c/o pain, unrated, RN aware- changing lidocaine patch  Skilled Therapeutic Interventions/Progress Updates: Goals:  Pt will identify 2 relaxation strategies to assist in stress management. Pt will identify 1 strategy to assist in the implementation of life style changes once home.    Session focused on stress management, relaxation strategies, discharge planning.  Discussed personal goal setting and potential strategies in meeting those goals to assist her in lifestyle changes for better physical health, but emotional health as well.  Goals met.   [place] 12/25/2021, 2:54 PM

## 2021-12-25 NOTE — Progress Notes (Signed)
Inpatient Rehabilitation Care Coordinator Discharge Note   Patient Details  Name: Alexandra Parsons MRN: [id number] Date of Birth: [birth year]   Discharge location: HOME WITH INTERMITTENT ASSIST FROM DAUGHTER  Length of Stay:    Discharge activity level: MOD/I-SUPERVISION LEVEL  Home/community participation: ACTIVE  Patient response [id number] Literacy - How often do you need to have someone help you when you read instructions, pamphlets, or other written material from your doctor or pharmacy?: Rarely  Patient response [id number] Isolation - How often do you feel lonely or isolated from those around you?: Never  Services provided included: MD, RD, PT, OT, RN, CM, TR, Pharmacy, SW  Financial Services:  Financial Services Utilized: Worker's Sales promotion account executive offered to/list presented to: WORK[employer] DECIDES  Follow-up services arranged:  Home Health, DME Home Health Agency: [employer]TING UP  PT & OT    DME : [employer]-[facility] SETTING UP WHEELCHAIR, SBQC, 3 IN 1 AND TUB BENCH    Patient response to transportation need: Is the patient able to respond to transportation needs?: Yes In the past 12 months, has lack of transportation kept you from medical appointments or from getting medications?: No In the past 12 months, has lack of transportation kept you from meetings, work, or from getting things needed for daily living?: No    Comments (or additional information): PT IS READY FOR DC AWAITING [employer] GETTING DME AND HH FOLLOW UP. PT WILL DC ONCE RECEIVES EQUIPMENT  Patient/Family verbalized understanding of follow-up arrangements:  Yes  Individual responsible for coordination of the follow-up plan: SELF [phone]  Confirmed correct DME delivered: Elease Hashimoto 12/25/2021    [place], Gardiner [place]

## 2021-12-25 NOTE — Progress Notes (Signed)
Physical Therapy Session Note  Patient Details  Name: Alexandra Parsons MRN: [id number] Date of Birth: Dec 10, 1960  Today's Date: 12/25/2021 PT Individual Time: [id number]; 1117-1200 PT Individual Time Calculation (min): 45 min and 43 mins  Short Term Goals: Week 2:  PT Short Term Goal 1 (Week 2): STG= LTG based on ELOS  Skilled Therapeutic Interventions/Progress Updates:    Session 1: Patient received sitting up in bed, reporting pain in her R elbow and L knee 6-7/10, premedicated. PT providing rest breaks, distractions and repositioning to assist with pain management. She was able to ambulate to bathroom with [facility] and supervision. Continent void. Ind Probation officer and perihygiene. Donning clean brief and pants seated with set up A. Patient completing morning ADLs seated at sink with set up A. PT transporting patient in wc to therapy gym for time management and energy conservation. PT re-wrapping elbow ACE wrap and re-applying Bledsoe brace. Patient ambulating back to her room with [facility] and supervision. Remaining up in wc, needs within reach.   Session 2: Patient received sitting up in recliner, agreeable to PT. She reports mild pain in L LE and R UE, premedicated. PT providing rest breaks, distractions and repositioning to assist with pain management. SW present to discuss when equipment will be delivered as patient is not safe to return home until equipment is delivered. Ambulatory transfer to wc with [facility] ModI. PT transporting patient in wc to therapy gym for time management and energy conservation. She negotiated 12 steps with [facility] and supervision. Patient ambulating >173ft with SBQC ModI to ortho gym. Supervision car transfer. Patient returning to her room in wc, made ModI in her room using [facility]- RN aware.    Therapy Documentation Precautions:  Precautions Precautions: Fall Required Braces or Orthoses: Other Brace Knee Immobilizer - Left: On when out of bed or walking Other Brace:  Bledsoe for RUE locked 0-30 degrees Restrictions Weight Bearing Restrictions: Yes RUE Weight Bearing: Non weight bearing LUE Weight Bearing: Weight bearing as tolerated RLE Weight Bearing: Weight bearing as tolerated LLE Weight Bearing: Weight bearing as tolerated Other Position/Activity Restrictions: "She can do anything with her left arm. She can platform, she can use crutches, any activity as tolerated." Per ortho MD 5.8    Therapy/Group: Individual Therapy  Karoline Caldwell, PT, DPT, CBIS  12/25/2021, 7:44 AM

## 2021-12-25 NOTE — Plan of Care (Signed)
  Problem: RH Balance Goal: LTG Patient will maintain dynamic sitting balance (PT) Description: LTG:  Patient will maintain dynamic sitting balance with assistance during mobility activities (PT) Outcome: Completed/Met   Problem: Sit to Stand Goal: LTG:  Patient will perform sit to stand with assistance level (PT) Description: LTG:  Patient will perform sit to stand with assistance level (PT) Outcome: Completed/Met   Problem: RH Bed Mobility Goal: LTG Patient will perform bed mobility with assist (PT) Description: LTG: Patient will perform bed mobility with assistance, with/without cues (PT). Outcome: Completed/Met   Problem: RH Bed to Chair Transfers Goal: LTG Patient will perform bed/chair transfers w/assist (PT) Description: LTG: Patient will perform bed to chair transfers with assistance (PT). Outcome: Completed/Met   Problem: RH Wheelchair Mobility Goal: LTG Patient will propel w/c in controlled environment (PT) Description: LTG: Patient will propel wheelchair in controlled environment, # of feet with assist (PT) Outcome: Completed/Met Goal: LTG Patient will propel w/c in home environment (PT) Description: LTG: Patient will propel wheelchair in home environment, # of feet with assistance (PT). Outcome: Completed/Met   Problem: RH Balance Goal: LTG Patient will maintain dynamic standing balance (PT) Description: LTG:  Patient will maintain dynamic standing balance with assistance during mobility activities (PT) Outcome: Completed/Met   Problem: RH Car Transfers Goal: LTG Patient will perform car transfers with assist (PT) Description: LTG: Patient will perform car transfers with assistance (PT). Outcome: Completed/Met   Problem: RH Ambulation Goal: LTG Patient will ambulate in controlled environment (PT) Description: LTG: Patient will ambulate in a controlled environment, # of feet with assistance (PT). Outcome: Completed/Met Goal: LTG Patient will ambulate in home  environment (PT) Description: LTG: Patient will ambulate in home environment, # of feet with assistance (PT). Outcome: Completed/Met

## 2021-12-25 NOTE — Progress Notes (Addendum)
Patient ID: Alexandra Parsons, female   DOB: June 22, 1961, 61 y.o.   MRN: [id number]  Attempted to call [employer] to see about equipment delivery. Await return call.  11:45 AM Pt has spoken with [facility] who will be providing the DME and home health services. Looked up and they are the coordinating agency for services, do not actually know who they will use. Pt will receive a text from them when equipment will be delivered. Aware discharge is on hold until her equipment is delivered.   3;14 PM Discussed with pt and daughter who was present issues with needing her equipment before she can go home. Contacted [facility] to see when will be delivering equipment. Will go to home due to daughter can not fit it in her car. Home health set up with [facility] for PT & OT. Co-worker will check in am regarding equipment information since this worker will be off.

## 2021-12-25 NOTE — Patient Care Conference (Signed)
Inpatient RehabilitationTeam Conference and Plan of Care Update Date: 12/24/2021   Time: 12:18 PM    Patient Name: Alexandra Parsons      Medical Record Number: [id number]  Date of Birth: [birth year] Sex: Female         Room/Bed: 4W02C/4W02C-01 Payor Info: Payor: [employer] / Plan: [employer] / Product Type: *No Product type* /    Admit Date/Time:  12/12/2021  3:22 PM  Primary Diagnosis:  Critical polytrauma  Hospital Problems: Principal Problem:   Critical polytrauma    Expected Discharge Date: Expected Discharge Date: 12/26/21  Team Members Present: Physician leading conference: Other (comment) (Dr. Jennye Boroughs) Social Worker Present: Ovidio Kin, LCSW Nurse Present: Dorien Chihuahua, RN PT Present: Estevan Ryder, PT OT Present: Meriel Pica, OT SLP Present: Weston Anna, SLP PPS Coordinator present : Gunnar Fusi, SLP     Current Status/Progress Goal Weekly Team Focus  Bowel/Bladder     Continent        Swallow/Nutrition/ Hydration             ADL's   supervision overall  mod I toileting and toilet transfer, dressing; supervision bathing and shower transfers  ADL training, safety awareness, precautions, mobility, precautions   Mobility   grossly supervision using SBQC, able to navigate up to 12 steps simulating ledge assist or using SBQC with close supervision, will need fam ed prior to dc  mod I for basic transfers, w/c mobility and very short distance gait (to get into rooms/doorways); min assist car tx  dc planning, pt/fam ed, stairs   Communication             Safety/Cognition/ Behavioral Observations            Pain     Pain in right shoulder addressed with prn meds        Skin     Splint, dressing   Skin managed   Dressing changes as ordered    Discharge Planning:  Awaiting [employer] to deliver equipment and set up home health-plan back to home with intermittent assist from daughter. Will need equipment before can be  discharged home   Team Discussion: Patient continues to have pain in right shoulder. Doing better since weight bearing restrictions adjusted. X-ray knee for pain; WNL. Patient is compliant with medications; DM managed. UTI treated; still has urgency.  Patient on target to meet rehab goals: yes, currently needs supervision for slide board and quad cane. Managed 12 steps with close supervision. Should be mod I around the home. Goals for discharge set for supervision overall and independent with toileting.   *See Care Plan and progress notes for long and short-term goals.   Revisions to Treatment Plan:  N/A   Teaching Needs: Safety, weight bearing restrictions, medications, skin care, etc.  Current Barriers to Discharge: Home enviroment access/layout and Lack of/limited family support  Possible Resolutions to Barriers: Family education HH follow up DME: BSC, TTB, W/C + cushion, ramp for entry to home     Medical Summary Current Status: Right olecranon fracture s/p ORIF,Left tibial plateau fracture,Left radial head fracture, DM2k, pain, UTI, Anemia, HTN  Barriers to Discharge: Home enviroment access/layout;Decreased family/caregiver support;Medical stability  Barriers to Discharge Comments: Right olecranon fracture s/p ORIF,Left tibial plateau fracture,Left radial head fracture, DM2, pain, UTI, Anemia, HTN Possible Resolutions to [employer] Focus: treatment for UTI, Follow labs, pain medications, brace use,Adjust DM meds   Continued Need for Acute Rehabilitation Level of Care: The patient requires daily  medical management by a physician with specialized training in physical medicine and rehabilitation for the following reasons: Direction of a multidisciplinary physical rehabilitation program to maximize functional independence : Yes Medical management of patient stability for increased activity during participation in an intensive rehabilitation regime.: Yes Analysis of laboratory  values and/or radiology reports with any subsequent need for medication adjustment and/or medical intervention. : Yes   I attest that I was present, lead the team conference, and concur with the assessment and plan of the team.   Dorien Chihuahua B 12/25/2021, 8:17 AM

## 2021-12-25 NOTE — Progress Notes (Signed)
Occupational Therapy Discharge Summary  Patient Details  Name: Alexandra Parsons MRN: [id number] Date of Birth: Aug 27, 1960    Patient has met 7 of 7 long term goals due to improved activity tolerance, improved balance, postural control, ability to compensate for deficits, improved coordination, and able to progress with increased weight bearing status .  Patient to discharge at overall  supervision to mod I   level.  Patient's care partner is independent to provide the necessary physical assistance at discharge prn.  Pt is able to instruct others how to help her  as needed. No formal fam education was preformed.   Reasons goals not met: n/a  Recommendation:  Patient will benefit from ongoing skilled OT services in home health setting to continue to advance functional skills in the area of BADL, iADL, and Reduce care partner burden.  Equipment: BSC, TTB, w;c   Reasons for discharge: treatment goals met and discharge from hospital  Patient/family agrees with progress made and goals achieved: Yes  OT Discharge Precautions/Restrictions  Precautions Precautions: Fall Required Braces or Orthoses: Other Brace Knee Immobilizer - Left: On when out of bed or walking Other Brace: Bledsoe for RUE locked 0-30 degrees Restrictions Weight Bearing Restrictions: Yes RUE Weight Bearing: Non weight bearing LUE Weight Bearing: Weight bearing as tolerated RLE Weight Bearing: Weight bearing as tolerated LLE Weight Bearing: Weight bearing as tolerated  ADL ADL Eating: Supervision/safety Where Assessed-Eating: Chair Grooming: Supervision/safety Where Assessed-Grooming: Chair (simulated task) Upper Body Bathing: Supervision/safety Where Assessed-Upper Body Bathing: Chair Lower Body Bathing: Supervision/safety Where Assessed-Lower Body Bathing: Chair (Simulated task) Upper Body Dressing: Supervision/safety Where Assessed-Upper Body Dressing: Chair Lower Body Dressing: Supervision/safety Where  Assessed-Lower Body Dressing: Chair Toileting: Supervision/safety Where Assessed-Toileting: Other (Comment) (simulated task at [facility]) Toilet Transfer: Modified independent (4 prong walker) Toilet Transfer Method: Counselling psychologist: Bedside commode Tub/Shower Transfer: Close supervison Clinical cytogeneticist Method: Optometrist: Facilities manager: Close supervision Social research officer, government Method: Heritage manager: Radio broadcast assistant ADL Comments: used quad cane Vision Baseline Vision/History: 1 Wears glasses Patient Visual Report: No change from baseline Perception  Perception: Within Functional Limits Praxis Praxis: Intact Cognition Cognition Overall Cognitive Status: Within Functional Limits for tasks assessed Arousal/Alertness: Awake/alert Orientation Level: Person;Place;Situation Person: Oriented Place: Oriented Situation: Oriented Memory: Appears intact Attention: Sustained Sustained Attention: Appears intact Awareness: Appears intact Problem Solving: Appears intact Safety/Judgment: Appears intact Brief Interview for Mental Status (BIMS) Repetition of Three Words (First Attempt): 2 Temporal Orientation: Year: Correct Temporal Orientation: Month: Accurate within 5 days Temporal Orientation: Day: Correct Recall: "Sock": Yes, no cue required Recall: "Blue": No, could not recall Recall: "Bed": Yes, after cueing ("a piece of furniture") BIMS Summary Score: 11 Sensation Sensation Light Touch: Appears Intact Hot/Cold: Appears Intact Proprioception: Appears Intact Stereognosis: Appears Intact Coordination Gross Motor Movements are Fluid and Coordinated: No Fine Motor Movements are Fluid and Coordinated: No Heel Shin Test: limited AROM due to pain and Bledsoe brace Motor  Motor Motor: Other (comment) Motor - Discharge Observations: antalgic related to fractures Mobility  Bed  Mobility Bed Mobility: Rolling Right;Rolling Left;Supine to Sit;Sit to Supine Rolling Right: Independent Rolling Left: Independent Supine to Sit: Independent Sit to Supine: Independent Transfers Sit to Stand: Independent with assistive device Stand to Sit: Independent with assistive device  Trunk/Postural Assessment  Cervical Assessment Cervical Assessment: Within Functional Limits Thoracic Assessment Thoracic Assessment: Within Functional Limits Lumbar Assessment Lumbar Assessment: Within Functional Limits Postural Control Postural Control: Deficits on evaluation  Balance  Balance Balance Assessed: Yes Static Sitting Balance Static Sitting - Balance Support: Feet supported Static Sitting - Level of Assistance: 7: Independent Dynamic Sitting Balance Dynamic Sitting - Balance Support: Feet supported Dynamic Sitting - Level of Assistance: 7: Independent Sitting balance - Comments: able to perform pericare sitting on BSC Static Standing Balance Static Standing - Balance Support: Left upper extremity supported Static Standing - Level of Assistance: 6: Modified independent (Device/Increase time) Dynamic Standing Balance Dynamic Standing - Balance Support: Left upper extremity supported Dynamic Standing - Level of Assistance: 6: Modified independent (Device/Increase time) Extremity/Trunk Assessment RUE Assessment General Strength Comments: continues to be NWB LUE Assessment General Strength Comments: 5/5   Willeen Cass [facility] 12/25/2021, 2:22 PM

## 2021-12-25 NOTE — Progress Notes (Signed)
Inpatient Rehabilitation Discharge Medication Review by a Pharmacist  A complete drug regimen review was completed for this patient to identify any potential clinically significant medication issues.  High Risk Drug Classes Is patient taking? Indication by Medication  Antipsychotic No   Anticoagulant No   Antibiotic No   Opioid Yes OxyIR- acute pain  Antiplatelet Yes Aspirin- CVA prophylaxis  Hypoglycemics/insulin Yes Januvia- T2DM  Vasoactive Medication No   Chemotherapy No   Other Yes Crestor / Zetia- HLD Lexapro- anxiety / MDD Trazodone- sleep     Type of Medication Issue Identified Description of Issue Recommendation(s)  Drug Interaction(s) (clinically significant)     Duplicate Therapy     Allergy     No Medication Administration End Date     Incorrect Dose     Additional Drug Therapy Needed     Significant med changes from prior encounter (inform family/care partners about these prior to discharge).    Other       Clinically significant medication issues were identified that warrant physician communication and completion of prescribed/recommended actions by midnight of the next day:  No   Time spent performing this drug regimen review (minutes):  30  Alexandra Parsons BS, PharmD, BCPS Clinical Pharmacist 12/26/2021 1:26 PM  Contact: [phone] after 3 PM  "Be curious, not judgmental..." -Debbora Dus

## 2021-12-26 ENCOUNTER — Other Ambulatory Visit (HOSPITAL_BASED_OUTPATIENT_CLINIC_OR_DEPARTMENT_OTHER): Payer: Self-pay

## 2021-12-26 LAB — GLUCOSE, CAPILLARY
Glucose-Capillary: 109 mg/dL — ABNORMAL HIGH (ref 70–99)
Glucose-Capillary: 123 mg/dL — ABNORMAL HIGH (ref 70–99)

## 2021-12-26 MED ORDER — ROSUVASTATIN CALCIUM 10 MG PO TABS
10.0000 mg | ORAL_TABLET | ORAL | 5 refills | Status: DC
  Filled 2021-12-26: qty 12, 28d supply, fill #0
  Filled 2022-02-23: qty 12, 28d supply, fill #1
  Filled 2022-04-08: qty 12, 28d supply, fill #2
  Filled 2022-05-11: qty 12, 28d supply, fill #3

## 2021-12-26 MED ORDER — ESCITALOPRAM OXALATE 20 MG PO TABS
20.0000 mg | ORAL_TABLET | Freq: Every day | ORAL | 1 refills | Status: DC
  Filled 2021-12-26 – 2022-04-08 (×2): qty 30, 30d supply, fill #0
  Filled 2022-05-11: qty 30, 30d supply, fill #1

## 2021-12-26 MED ORDER — OXYCODONE HCL 10 MG PO TABS
10.0000 mg | ORAL_TABLET | Freq: Four times a day (QID) | ORAL | 0 refills | Status: DC | PRN
  Filled 2021-12-26: qty 42, 7d supply, fill #0

## 2021-12-26 MED ORDER — DOCUSATE SODIUM 100 MG PO CAPS
100.0000 mg | ORAL_CAPSULE | Freq: Two times a day (BID) | ORAL | 0 refills | Status: DC
Start: 2021-12-26 — End: 2022-01-01
  Filled 2021-12-26: qty 100, 50d supply, fill #0

## 2021-12-26 MED ORDER — ASPIRIN 325 MG PO TABS
325.0000 mg | ORAL_TABLET | Freq: Every day | ORAL | 0 refills | Status: DC
  Filled 2021-12-26: qty 100, 100d supply, fill #0

## 2021-12-26 MED ORDER — GLUCOSE BLOOD VI STRP
ORAL_STRIP | 0 refills | Status: AC
  Filled 2021-12-26: qty 100, 25d supply, fill #0

## 2021-12-26 MED ORDER — SITAGLIPTIN PHOSPHATE 100 MG PO TABS
100.0000 mg | ORAL_TABLET | Freq: Every day | ORAL | 0 refills | Status: DC
  Filled 2021-12-26: qty 30, 30d supply, fill #0

## 2021-12-26 MED ORDER — POLYETHYLENE GLYCOL 3350 17 GM/SCOOP PO POWD
17.0000 g | Freq: Every day | ORAL | 1 refills | Status: DC | PRN
  Filled 2021-12-26: qty 510, 30d supply, fill #0

## 2021-12-26 MED ORDER — METHOCARBAMOL 500 MG PO TABS
500.0000 mg | ORAL_TABLET | Freq: Four times a day (QID) | ORAL | 0 refills | Status: DC | PRN
  Filled 2021-12-26: qty 90, 23d supply, fill #0

## 2021-12-26 MED ORDER — TRAZODONE HCL 50 MG PO TABS
50.0000 mg | ORAL_TABLET | Freq: Every evening | ORAL | 0 refills | Status: DC | PRN
  Filled 2021-12-26: qty 15, 15d supply, fill #0

## 2021-12-26 MED ORDER — EZETIMIBE 10 MG PO TABS
10.0000 mg | ORAL_TABLET | Freq: Every day | ORAL | 0 refills | Status: DC
  Filled 2021-12-26 – 2022-04-08 (×2): qty 30, 30d supply, fill #0

## 2021-12-26 MED ORDER — NICOTINE 21 MG/24HR TD PT24
21.0000 mg | MEDICATED_PATCH | Freq: Every day | TRANSDERMAL | 0 refills | Status: DC
  Filled 2021-12-26: qty 28, 28d supply, fill #0

## 2021-12-26 MED ORDER — BLOOD GLUCOSE MONITOR KIT
PACK | 0 refills | Status: AC
  Filled 2021-12-26: qty 1, 1d supply, fill #0

## 2021-12-26 MED ORDER — LIDOCAINE 5 % EX PTCH
1.0000 | MEDICATED_PATCH | CUTANEOUS | 0 refills | Status: DC
  Filled 2021-12-26: qty 30, 30d supply, fill #0

## 2021-12-26 MED ORDER — LIDOCAINE 4 % EX CREA
TOPICAL_CREAM | Freq: Three times a day (TID) | CUTANEOUS | 0 refills | Status: DC | PRN
  Filled 2021-12-26: qty 120, 30d supply, fill #0

## 2021-12-26 MED ORDER — LANCETS MISC
0 refills | Status: AC
Start: 2021-12-26 — End: ?
  Filled 2021-12-26: qty 100, 25d supply, fill #0

## 2021-12-26 MED FILL — Hydromorphone HCl Inj 2 MG/ML: INTRAMUSCULAR | Qty: 1 | Status: AC

## 2021-12-26 NOTE — Progress Notes (Signed)
INPATIENT REHABILITATION DISCHARGE NOTE   Discharge instructions by: Marissa Nestle PA-C  Verbalized understanding: yes  Skin care/Wound care healing? Intact   Pain:0/10  IV's: none   Tubes/Drains: none   O2: none   Safety instructions: given by pa  Patient belongings: sent with family   Discharged to: home   Discharged via:Wheel chair   Notes:

## 2021-12-26 NOTE — Progress Notes (Signed)
Patient ID: Alexandra Parsons, female   DOB: Mar 02, 1961, 61 y.o.   MRN: [id number]  This SW covering for primary SW, [place].  0915am-SW met with pt in room to discuss DME delivery. Reports that the DME is to be delivered by 11am and her dtr will be there to receive. States once delivered, her dtr will pick her up.   *on pt way out, pt reported she misplaced Rx info so she can pick up later. SW informed will f/u with WC CM Kim and discuss.  SW spoke with Maudie Mercury who reported will email Rx info to this Wirt emailed Rx info to: [email or web address]  Loralee Pacas, MSW, [place] Office: [phone] Cell: [phone] Fax: [phone]

## 2021-12-26 NOTE — Progress Notes (Signed)
PROGRESS NOTE   Subjective/Complaints: Going home today. Reports she feels well overall. No concerns.    Review of Systems  Respiratory:  Negative for cough and shortness of breath.   Cardiovascular:  Negative for chest pain.  Gastrointestinal:  Negative for constipation, nausea and vomiting.  Genitourinary:  Positive for frequency and urgency. Negative for flank pain and hematuria.  Musculoskeletal:  Positive for joint pain. Negative for back pain, falls and neck pain.  Neurological:  Negative for sensory change.     Objective:   No results found. No results for input(s): WBC, HGB, HCT, PLT in the last 72 hours.  No results for input(s): NA, K, CL, CO2, GLUCOSE, BUN, CREATININE, CALCIUM in the last 72 hours.   Intake/Output Summary (Last 24 hours) at 12/26/2021 0908 Last data filed at 12/26/2021 0700 Gross per 24 hour  Intake 940 ml  Output --  Net 940 ml         Physical Exam: Vital Signs Blood pressure (!) 156/67, pulse (!) 59, temperature (!) 97.4 F (36.3 C), temperature source Oral, resp. rate 16, height 5\' 7"  (1.702 m), weight 81.5 kg, SpO2 98 %.   Constitutional: No distress . Vital signs reviewed. Sitting in bed HEENT: NCAT, EOMI, oral membranes moist Neck: supple Cardiovascular: RRR no MRG Respiratory/Chest: CTA Bilaterally without wheezes or rales. Normal effort    GI/Abdomen: BS +, non-tender, non-distended Ext: no clubbing, cyanosis, or edema Psych: pleasant and cooperative  Skin: warm and dry, no breakdown noted Neuro:  Alert and oriented. Follows commands.  CN 2-12 grossly intact. Intact sensation in all 4 to LT. Moving all 4 extremities. Musculoskeletal: Right hinged elbow brace. Hinged bledsoe knee brace LLE Sensation intact to all fingers of right hand. Right UE tender with PROM/AROM    Assessment/Plan: 1. Functional deficits which require 3+ hours per day of interdisciplinary therapy in  a comprehensive inpatient rehab setting. Physiatrist is providing close team supervision and 24 hour management of active medical problems listed below. Physiatrist and rehab team continue to assess barriers to discharge/monitor patient progress toward functional and medical goals  Care Tool:  Bathing    Body parts bathed by patient: Right arm, Left arm, Chest, Abdomen, Front perineal area, Buttocks, Right upper leg, Left upper leg, Right lower leg, Left lower leg, Face   Body parts bathed by helper: Left arm Body parts n/a: Left lower leg   Bathing assist Assist Level: Supervision/Verbal cueing     Upper Body Dressing/Undressing Upper body dressing   What is the patient wearing?: Pull over shirt    Upper body assist Assist Level: Supervision/Verbal cueing    Lower Body Dressing/Undressing Lower body dressing      What is the patient wearing?: Pants, Underwear/pull up     Lower body assist Assist for lower body dressing: Supervision/Verbal cueing     Toileting Toileting    Toileting assist Assist for toileting: Supervision/Verbal cueing     Transfers Chair/bed transfer  Transfers assist     Chair/bed transfer assist level: Independent with assistive device Chair/bed transfer assistive device: Cane   Locomotion Ambulation   Ambulation assist   Ambulation activity did not occur: Safety/medical concerns  Assist level: Independent with assistive device Assistive device: Cane-quad Max distance: 200   Walk 10 feet activity   Assist  Walk 10 feet activity did not occur: Safety/medical concerns  Assist level: Independent with assistive device Assistive device: Cane-quad   Walk 50 feet activity   Assist Walk 50 feet with 2 turns activity did not occur: Safety/medical concerns  Assist level: Independent with assistive device Assistive device: Cane-quad    Walk 150 feet activity   Assist Walk 150 feet activity did not occur: Safety/medical  concerns  Assist level: Independent with assistive device Assistive device: Cane-quad    Walk 10 feet on uneven surface  activity   Assist Walk 10 feet on uneven surfaces activity did not occur:  (Using 4 prong walker)   Assist level: Supervision/Verbal cueing Assistive device: Cane-quad   Wheelchair     Assist Is the patient using a wheelchair?: Yes Type of Wheelchair: Manual    Wheelchair assist level: Independent Max wheelchair distance: 300    Wheelchair 50 feet with 2 turns activity    Assist        Assist Level: Independent   Wheelchair 150 feet activity     Assist      Assist Level: Independent   Blood pressure (!) 156/67, pulse (!) 59, temperature (!) 97.4 F (36.3 C), temperature source Oral, resp. rate 16, height 5\' 7"  (1.702 m), weight 81.5 kg, SpO2 98 %.  Medical Problem List and Plan: 1. Functional deficits secondary to fall with multiple fractures             -patient may shower but incisions must be covered.              -ELOS/Goals: 5/19 Mod I             -Continue CIR therapies including PT, OT   -5/8 Ortho Dr. Sammuel Hines reports pt may do any activity with LUE.    -DC today  -Weightbearing status upgraded by ortho 5/12 2.  Antithrombotics: -DVT/anticoagulation:  Pharmaceutical: Other (comment) aspirin 325 mg daily             -antiplatelet therapy: Aspirin 325 mg daily 3. Postoperative pain: continue Tylenol, oxycodone as needed  -5/7 pt reports pain is controlled  -5/16  she reports she takes oxy 5mg  at home. She will try to use 10mg  tonight instead of 15mg  dose 4. Depression/anxiety: CSW to evaluate and provide support             -antipsychotic agents: n/a             -continue Lexapro  -continue low-dose Xanax as needed for anxiety  -Reports mood is good overall 5/11 5. Neuropsych: This patient is capable of making decisions on her own behalf. 6. Skin/Wound Care: Routine skin care checks             -- Monitor RUE surgical  incision once splint is removed; has nylon sutures  -Splint appears to be wrapped appropriately and intact.  7. Fluids/Electrolytes/Nutrition: Routine Is and Os and follow-up chemistries 8. Right olecranon fracture s/p ORIF             --NWB; splint in place, Nylon sutures  -Ortho started her with R hinged elbow brace for motion 30-110, she should not use elbow for platform weightbearing 9: Left tibial plateau fracture: touch down weight-bearing             -5/8 Ortho reports L Bledsoe leg brace to be locked in extension  when ambulating, can be unlocked when in bed  -5/13 Per Ortho LLE upgraded to WBAT Left leg with brace locked with weightbearing to assist with ambulation  -5/17 Xray left knee with no acute changes 10: Left radial head fracture:  -Ortho Dr. Sammuel Hines reports pt may do any activity with LUE.   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 Discussed this is in similar class DPP-4 as [place]. She agrees to take this medication knowing its similar to [place] 5/11 Glucose control improved today, she did not take tradjenta, agrees to take 5/16 She has been using tradjenta last few days, glucose control fair, continue to monitor 5/19 Glucose continues to be well controlled 12: ABLA: improving  -5/8 CBC 11.3, improved continue to monitor  -5/17 last hemoglobin 10.6 stable, continue to monitor 13: Constipation: is chronic;  Stools softeners not usually effective  -5/11 schedule miralax  -5/17 BM yesterday, constipation improved 14: Hypoalbuminemia: continue protein supplements 15: Tobacco use: cessation  counseling; continue Nicoderm 16: Hypertension: continue Ziac  5/8 BP overall well controlled, continue to monitor  -5/13 BP a little low, will stop Ziac and monitor  -5/19 BP a little higher this AM but overall well controlled, f/u PCP 17: Hyperlipidemia: continue Zetia, Crestor 18. UTI -U/A 5/9 with only small leukocytes, rare bacteria. Denies dysuria today just reports urinating  frequently but drinking a lot of water. Will monitor and repeat U/A and culture if symptoms of UTI develop. -5/15 100k ecoli sens to keflex. -Completed Keflex coarse today, symptoms improved 19. Wrist pain and L thumb pain  -OT to try soft wrist and thumb wrap  -Will order lidocaine cream  -Consider voltaren gel if not improved 20. Chest wall pain  -5/12 Lidocaine patch    LOS: 14 days A FACE TO FACE EVALUATION WAS PERFORMED  Jennye Boroughs 12/26/2021, 9:08 AM

## 2021-12-29 ENCOUNTER — Telehealth: Payer: Self-pay

## 2021-12-29 NOTE — Telephone Encounter (Signed)
Transition Care Management Follow-up Telephone CallT Date of discharge and from where: [facility] DC Alexandra Parsons 12-26-21 Dx: critical polytrauma How have you been since you were released from the hospital? Doing better  Any questions or concerns? No  Items Reviewed: Did the pt receive and understand the discharge instructions provided? Yes  Medications obtained and verified? Yes  Other? No  Any new allergies since your discharge? No  Dietary orders reviewed? Yes Do you have support at home? Yes   Home Care and Equipment/Supplies: Were home health services ordered? Yes PT If so, what is the name of the agency? [facility]   Has the agency set up a time to come to the patient's home? yes Were any new equipment or medical supplies ordered?  YES- quadcane- WC/ shower chair What is the name of the medical supply agency? [facility]  Were you able to get the supplies/equipment? yes Do you have any questions related to the use of the equipment or supplies? No  Functional Questionnaire: (I = Independent and D = Dependent) ADLs: I  Bathing/Dressing- I  Meal Prep- I  Eating- I  Maintaining continence- I  Transferring/Ambulation- I  Managing Meds- I  Follow up appointments reviewed:  PCP Hospital f/u appt confirmed? Yes  [facility]? No- pt waiting on office to call for appt from orthopaedics  Are transportation arrangements needed? No  If their condition worsens, is the pt aware to call PCP or go to the Emergency Dept.? Yes Was the patient provided with contact information for the PCP's office or ED? Yes Was to pt encouraged to call back with questions or concerns? Yes

## 2021-12-30 ENCOUNTER — Telehealth: Payer: Self-pay

## 2021-12-30 ENCOUNTER — Telehealth: Payer: Self-pay | Admitting: Orthopaedic Surgery

## 2021-12-30 ENCOUNTER — Inpatient Hospital Stay: Payer: Self-pay | Admitting: Internal Medicine

## 2021-12-30 NOTE — Telephone Encounter (Signed)
Received call from Jeannett Senior (PT) with [facility] verbal orders for Home Health PT  1 Wk 6 for gait,balance and strengthening training.   The number to contact Jeannett Senior is [phone]. Jeannett Senior said it is ok to leave a message.

## 2021-12-30 NOTE — Telephone Encounter (Signed)
Transitional Care call--who you talked with patient, [place]    Are you/is patient experiencing any problems since coming home? NO Are there any questions regarding any aspect of care? NO Are there any questions regarding medications administration/dosing? NO Are meds being taken as prescribed? PATIENT IS NO LONGER TAKING COLACE OR LIDOCAINE CREAM Patient should review meds with caller to confirm Have there been any falls? NO Has Home Health been to the house and/or have they contacted you? YES If not, have you tried to contact them? Can we help you contact them? Are bowels and bladder emptying properly? YES Are there any unexpected incontinence issues? If applicable, is patient following bowel/bladder programs? Any fevers, problems with breathing, unexpected pain? NO Are there any skin problems or new areas of breakdown? NO Has the patient/family member arranged specialty MD follow up (ie cardiology/neurology/renal/surgical/etc)?  YES Can we help arrange? Does the patient need any other services or support that we can help arrange? NO Are caregivers following through as expected in assisting the patient? YES Has the patient quit smoking, drinking alcohol, or using drugs as recommended? PATIENT IS STILL SMOKING  Appointment time, arrive time and who it is with here 01/15/22 at 10:20 am arrival time 10 am with Dr. Curlene Dolphin [address]

## 2022-01-01 ENCOUNTER — Other Ambulatory Visit (HOSPITAL_BASED_OUTPATIENT_CLINIC_OR_DEPARTMENT_OTHER): Payer: Self-pay

## 2022-01-01 ENCOUNTER — Ambulatory Visit (INDEPENDENT_AMBULATORY_CARE_PROVIDER_SITE_OTHER): Payer: BC Managed Care – PPO | Admitting: Internal Medicine

## 2022-01-01 ENCOUNTER — Encounter: Payer: Self-pay | Admitting: Internal Medicine

## 2022-01-01 VITALS — BP 136/80 | HR 101 | Temp 98.3°F | Resp 18 | Ht 67.0 in | Wt 174.0 lb

## 2022-01-01 DIAGNOSIS — I10 Essential (primary) hypertension: Secondary | ICD-10-CM | POA: Diagnosis not present

## 2022-01-01 DIAGNOSIS — T07XXXA Unspecified multiple injuries, initial encounter: Secondary | ICD-10-CM

## 2022-01-01 DIAGNOSIS — E119 Type 2 diabetes mellitus without complications: Secondary | ICD-10-CM | POA: Diagnosis not present

## 2022-01-01 LAB — BASIC METABOLIC PANEL
BUN: 12 mg/dL (ref 6–23)
CO2: 28 mEq/L (ref 19–32)
Calcium: 9.5 mg/dL (ref 8.4–10.5)
Chloride: 102 mEq/L (ref 96–112)
Creatinine, Ser: 0.78 mg/dL (ref 0.40–1.20)
GFR: 81.81 mL/min (ref >60.00)
Glucose, Bld: 164 mg/dL — ABNORMAL HIGH (ref 70–99)
Potassium: 4 mEq/L (ref 3.5–5.1)
Sodium: 139 mEq/L (ref 135–145)

## 2022-01-01 LAB — CBC WITH DIFFERENTIAL/PLATELET
Basophils Absolute: 0 10*3/uL (ref 0.0–0.1)
Basophils Relative: 0.7 % (ref 0.0–3.0)
Eosinophils Absolute: 0.1 10*3/uL (ref 0.0–0.7)
Eosinophils Relative: 1.7 % (ref 0.0–5.0)
HCT: 35.8 % — ABNORMAL LOW (ref 36.0–46.0)
Hemoglobin: 12.1 g/dL (ref 12.0–15.0)
Lymphocytes Relative: 20.8 % (ref 12.0–46.0)
Lymphs Abs: 1.4 10*3/uL (ref 0.7–4.0)
MCHC: 33.9 g/dL (ref 30.0–36.0)
MCV: 97.2 fl (ref 78.0–100.0)
Monocytes Absolute: 0.5 10*3/uL (ref 0.1–1.0)
Monocytes Relative: 7.5 % (ref 3.0–12.0)
Neutro Abs: 4.8 10*3/uL (ref 1.4–7.7)
Neutrophils Relative %: 69.3 % (ref 43.0–77.0)
Platelets: 231 10*3/uL (ref 150.0–400.0)
RBC: 3.68 Mil/uL — ABNORMAL LOW (ref 3.87–5.11)
RDW: 14.3 % (ref 11.5–15.5)
WBC: 6.9 10*3/uL (ref 4.0–10.5)

## 2022-01-01 LAB — HEMOGLOBIN A1C: Hgb A1c MFr Bld: 5.7 % (ref 4.6–6.5)

## 2022-01-01 MED ORDER — OXYCODONE HCL 10 MG PO TABS
10.0000 mg | ORAL_TABLET | Freq: Four times a day (QID) | ORAL | 0 refills | Status: DC | PRN
  Filled 2022-01-01: qty 100, 25d supply, fill #0

## 2022-01-01 NOTE — Patient Instructions (Addendum)
Recommend to proceed with covid booster (bivalent) at your pharmacy.   Per our records you are due for your diabetic eye exam. Please contact your eye doctor to schedule an appointment. Please have them send copies of your office visit notes to [place]. Our fax number is [phone]. If you need a referral to an eye doctor please let us know.  For diabetes: Metformin 1000 mg twice daily. If needed, we can go back on Actos and Januvia Check your blood sugar at different times  - early in AM fasting  ( goal 70-130) - 2 hours after a meal (goal less than 180)     Check the  blood pressure regularly BP GOAL is between 110/65 and  135/85. If it is consistently higher or lower, let me know  For pain management: Tylenol 500: 1 or 2 tablets 3 times a day Oxycodone 10 mg: Every 6 hours as needed. Goal is to decrease oxycodone to every 8 hours   GO TO THE LAB : Get the blood work     GO TO THE FRONT DESK, PLEASE SCHEDULE YOUR APPOINTMENTS Come back for a checkup in 2 months

## 2022-01-01 NOTE — Progress Notes (Signed)
Subjective:    Patient ID: Alexandra Parsons, female    DOB: 26-May-1961, 61 y.o.   MRN: [id number]  DOS:  01/01/2022 Type of visit - description: Hospital follow-up  Hospital admission 12/07/2021, discharged 12/12/2021 Presented after a fall 12/07/2021, fall was mechanical, not related to oversedation. X-ray showed: -Left elbow intra-articular fracture. -Right elbow severely comminuted displaced fracture.  ORIF 12-30-21   -Large L knee effusion with displaced plateau fracture.  Subsequently was admitted to in-house rehab from 12/12/2021 to 12/26/2021 and subsequently discharged home.   At this point, she is back home, currently not working, daughter is helping with ADLs.  Today we talk about management of pain, diabetes.  Extensive medication reconciliation   Review of Systems Denies chest pain or difficulty breathing No edema  Past Medical History:  Diagnosis Date   Abnormal Pap smear of cervix    h/o HPV   Chronic pain disorder    arthitis and fibromyalgia   Chronic sinusitis, chronic cough (?COPD) 07/16/2009   Depression    Diabetes mellitus    type 2   Dyslipidemia    Elevated LFTs 11/19/2011   Hypertension    Leukocytosis 11/19/2011   Menopausal syndrome    Neck pain 05/20/2011   Sarcoid    post partum in remission   Submandibular gland swelling    Right    Past Surgical History:  Procedure Laterality Date   abdominal plasty remotely     [place] Left 12/09/2021   Procedure: LEFT KNEE  NEEDLE ASPIRATION;  Surgeon: Vanetta Mulders, MD;  Location: [facility];  Service: Orthopedics;  Laterality: Left;   NASAL SEPTUM SURGERY  12/26/2015   ORIF ELBOW FRACTURE Right 12/09/2021   Procedure: OPEN REDUCTION INTERNAL FIXATION (ORIF) ELBOW/OLECRANON FRACTURE;  Surgeon: Vanetta Mulders, MD;  Location: [facility];  Service: Orthopedics;  Laterality: Right;   pilondial cyst removal     TUBAL LIGATION     TURBINATE REDUCTION Bilateral 12/26/2015    Current Outpatient  Medications  Medication Instructions   aspirin EC 81 mg, Oral, Daily, Swallow whole.   blood glucose meter kit and supplies KIT Use up to 4 times daily as directed   Cholecalciferol (VITAMIN D3) 50 MCG (2000 UT) CAPS Oral   escitalopram (LEXAPRO) 20 mg, Oral, Daily   ezetimibe (ZETIA) 10 mg, Oral, Daily   glucose blood test strip Use as directed up to 4 times daily   Lancets MISC Use as directed 4 times daily   metFORMIN (GLUCOPHAGE) 1,000 mg, Oral, 2 times daily with meals   methocarbamol (ROBAXIN) 500 mg, Oral, Every 6 hours PRN   OVER THE COUNTER MEDICATION Emma 2 capsules daily for constipation   Oxycodone HCl 10 MG TABS Take 1 - 1 & 1/2 tablets (10-15 mg total) by mouth every 6 (six) hours as needed for severe pain.   polyethylene glycol powder (GLYCOLAX/MIRALAX) 17 GM/SCOOP powder Mix 17 grams in 8 oz of liquid and drink by mouth daily as needed for mild constipation.   rosuvastatin (CRESTOR) 10 mg, Oral, Every M-W-F   traZODone (DESYREL) 50 mg, Oral, At bedtime PRN       Objective:   Physical Exam BP 136/80   Pulse (!) 101   Temp 98.3 F (36.8 C) (Oral)   Resp 18   Ht 5' 7" (1.702 m)   Wt 174 lb (78.9 kg)   SpO2 98%   BMI 27.25 kg/m  General:   Well developed, NAD, BMI noted. HEENT:  Normocephalic . Face  symmetric, atraumatic Lungs:  CTA B Normal respiratory effort, no intercostal retractions, no accessory muscle use. Heart: RRR,  no murmur.  Lower extremities: Has a brace on the left leg.  Calves are soft and nontender. Skin: Not pale. Not jaundice Neurologic:  alert & oriented X3.  Speech normal, gait limited by left leg brace Psych--  Cognition and judgment appear intact.  Cooperative with normal attention span and concentration.  Behavior appropriate. No anxious or depressed appearing.      Assessment      Assessment DM HTN Dyslipidemia: intolerant to zocor ~ 2014 and  Lipitor 04/2018 Vit d def   CT 09-2018 ---COPD ---Pulmonary nodules, declined  further CTs on 05-2019 --- (+)  Coronary calcium   Smoker ~1  ppd Psych: --Depression anxiety --ADHD MSK:  Fibromyalgia  (see OV 01/07/2021, 05/09/2021) Neck pain, chronic, on pain meds prn, UDS 09-2013 risk; intolerant to vicodin, oxycodone ok GYN: --BTL, Menopausal , h/o  Abnormal Pap smear, HPV Elevated LFTs: Hepatitis serology (-) 2008 H/o Sarcoid postpartum in remission H/o Chronic cough: Chronic sinusitis? COPD? H/o Leukocytosis  H/o +  PPD   PLAN Polytrauma, hospital follow-up: Patient had a mechanical fall, sustained bilateral elbow fractures and L knee fracture. After an acute admission, she went to rehab and is currently home. We had a long discussion about pain management Previously on Percocet 5-325 tid.  Apparently due to some shortages of Percocet she is now on oxycodone. Initially sent home on oxycodone 15 mg QID, self decrease to 10 mg every 6 hours. Recommend to decrease to TID but she said that will be extremely difficult. Plan:  -Continue oxycodone 10 mg QID, Tylenol 3 times daily, reassess the situation on RTC -PDMP reviewed and correct.  RF oxycodone   - Also takes Robaxin, RF as needed. DM: Previously on metformin, Actos, Januvia.  After the hospital admission she was discharged on Januvia only.  Has not check her CBGs recently. Plan: Check A1c, monitor ambulatory CBGs closely, restart metformin.  Depending on how she is doing, consider restart Actos/Januvia. HTN: Used to be on Ziac, not on meds since she was discharged home.  Check BMP and CBC RTC 2 months

## 2022-01-02 NOTE — Assessment & Plan Note (Signed)
Polytrauma, hospital follow-up: Patient had a mechanical fall, sustained bilateral elbow fractures and L knee fracture. After an acute admission, she went to rehab and is currently home. We had a long discussion about pain management Previously on Percocet 5-325 tid.  Apparently due to some shortages of Percocet she is now on oxycodone. Initially sent home on oxycodone 15 mg QID, self decrease to 10 mg every 6 hours. Recommend to decrease to TID but she said that will be extremely difficult. Plan:  -Continue oxycodone 10 mg QID, Tylenol 3 times daily, reassess the situation on RTC -PDMP reviewed and correct.  RF oxycodone   - Also takes Robaxin, RF as needed. DM: Previously on metformin, Actos, Januvia.  After the hospital admission she was discharged on Januvia only.  Has not check her CBGs recently. Plan: Check A1c, monitor ambulatory CBGs closely, restart metformin.  Depending on how she is doing, consider restart Actos/Januvia. HTN: Used to be on Ziac, not on meds since she was discharged home.  Check BMP and CBC RTC 2 months

## 2022-01-06 ENCOUNTER — Encounter: Payer: BC Managed Care – PPO | Admitting: Physical Medicine & Rehabilitation

## 2022-01-09 MED FILL — Hydromorphone HCl Inj 2 MG/ML: INTRAMUSCULAR | Qty: 1 | Status: AC

## 2022-01-13 ENCOUNTER — Other Ambulatory Visit (HOSPITAL_BASED_OUTPATIENT_CLINIC_OR_DEPARTMENT_OTHER): Payer: Self-pay

## 2022-01-13 ENCOUNTER — Other Ambulatory Visit: Payer: Self-pay | Admitting: Physical Medicine and Rehabilitation

## 2022-01-13 MED ORDER — METHOCARBAMOL 500 MG PO TABS
500.0000 mg | ORAL_TABLET | Freq: Four times a day (QID) | ORAL | 0 refills | Status: DC | PRN
  Filled 2022-01-13 (×2): qty 90, 23d supply, fill #0

## 2022-01-14 ENCOUNTER — Other Ambulatory Visit (HOSPITAL_BASED_OUTPATIENT_CLINIC_OR_DEPARTMENT_OTHER): Payer: Self-pay

## 2022-01-15 ENCOUNTER — Encounter: Payer: Self-pay | Admitting: Physical Medicine & Rehabilitation

## 2022-01-15 ENCOUNTER — Encounter
Payer: BC Managed Care – PPO | Attending: Physical Medicine & Rehabilitation | Admitting: Physical Medicine & Rehabilitation

## 2022-01-15 VITALS — BP 147/79 | HR 68 | Ht 67.0 in | Wt 172.6 lb

## 2022-01-15 DIAGNOSIS — N390 Urinary tract infection, site not specified: Secondary | ICD-10-CM | POA: Diagnosis not present

## 2022-01-15 DIAGNOSIS — G8918 Other acute postprocedural pain: Secondary | ICD-10-CM | POA: Insufficient documentation

## 2022-01-15 DIAGNOSIS — T07XXXA Unspecified multiple injuries, initial encounter: Secondary | ICD-10-CM | POA: Diagnosis present

## 2022-01-15 NOTE — Progress Notes (Signed)
Subjective:    Patient ID: Alexandra Parsons, female    DOB: February 18, 1961, 61 y.o.   MRN: [id number]  HPI   Brief HPI:   Alexandra Parsons is a 61 y.o. female had a ground-level fall on 12/07/2021 while at work.  She was carrying items in both hands and fell to both elbows.  She was brought to the emergency department where imaging revealed right olecranon process fracture, left tibial plateau fracture and left radial head fracture.  She underwent operative repair of her right upper extremity on 5/2.     Hospital Course: Alexandra Parsons was admitted to rehab 12/12/2021 for inpatient therapies to consist of PT, ST and OT at least three hours five days a week. Past admission physiatrist, therapy team and rehab RN have worked together to provide customized collaborative inpatient rehab.  The patient exhibited some degree of anxiety secondary to polytrauma and low-dose Xanax was provided as needed.  Dr. Steward Drone followed up on 5/8.  Exam revealed improvement in left knee effusion.  Allowed weight-bearing through left forearm. She remained in hinged knee brace with TDWB status.     Blood pressures were monitored on TID basis and remained stable on Ziac.   Diabetes has been monitored with ac/hs CBG checks and SSI was use prn for tighter BS control. CBGs appeared a bit labile.  Tradjenta in place of the patient's Januvia was started on 5/6. Advised to monitor CBGs at discharge and resume metformin 500 mg BID if glucose over 120. If remain elevated over 24 hours, increase metformin to 1000 mg BID.   Rehab course: During patient's stay in rehab weekly team conferences were held to monitor patient's progress, set goals and discuss barriers to discharge. At admission, patient required min a to scoot and assist to hold slideboard. Transfer x 2 with grossly CGA.  Max assist to stand pivot to toilet. Squat pivot with min A.   She  has had improvement in activity tolerance, balance, postural control as well as ability to  compensate for deficits. She has had improvement in functional use RUE/LUE  and RLE/LLE as well as improvement in awareness    Interval History  Alexandra Parsons is here for follow-up after his stay at [facility].  She reports she has been doing very well since her discharge.  She has seen her PCP for follow-up and has only required medications.  She continues to receive oxycodone from her PCP, she she was previously taking a 5 mg dose.  During her CIR stay she was using 15 mg oxycodone.  The pain has improved and she is now able to use a 10 mg dose.  She is continue to work with home PT. UTI symptoms have not recurred.  She has follow-up scheduled with Dr. Steward Drone next week.  She continues to use her left lower extremity and right upper extremity braces.  Pronation and supination of her left upper extremity has improved.  Pain Inventory Average Pain 7 Pain Right Now 5 My pain is sharp and aching  LOCATION OF PAIN  elbow, knee  BOWEL Number of stools per week: 3 Oral laxative use Yes  Type of laxative miralax Enema or suppository use No  History of colostomy No  Incontinent No   BLADDER Pads In and out cath, frequency . Able to self cath  . Bladder incontinence No  Frequent urination Yes  Leakage with coughing No  Difficulty starting stream No  Incomplete bladder emptying No    Mobility  use a cane ability to climb steps?  yes do you drive?  no  Function I need assistance with the following:  meal prep, household duties, and shopping  Neuro/Psych bladder control problems trouble walking spasms depression  Prior Studies TC appt  Physicians involved in your care TC appt   Family History  Problem Relation Age of Onset   Hypertension Other        GM   Colon cancer Other        COLON CA GGM x 2   Diabetes Other        aunt   CAD Maternal Grandfather 45   Stroke Neg Hx    Breast cancer Neg Hx    Social History   Socioeconomic History   Marital status: Widowed     Spouse name: Not on file   Number of children: 1   Years of education: Not on file   Highest education level: Not on file  Occupational History   Occupation: RN [employer]  Tobacco Use   Smoking status: Every Day    Packs/day: 1.00    Years: 28.00    Total pack years: 28.00    Types: Cigarettes   Smokeless tobacco: Never  Substance and Sexual Activity   Alcohol use: Yes    Alcohol/week: 0.0 standard drinks of alcohol    Comment: rare   Drug use: No   Sexual activity: Not on file  Other Topics Concern   Not on file  Social History Narrative   Lost husband, prostate cancer, 08-2012   Lives by herself   Has a daughter , lives in [place]   Social Determinants of Health   Financial Resource Strain: Not on file  Food Insecurity: Not on file  Transportation Needs: Not on file  Physical Activity: Not on file  Stress: Not on file  Social Connections: Not on file   Past Surgical History:  Procedure Laterality Date   abdominal plasty remotely     FINE NEEDLE ASPIRATION Left 12/09/2021   Procedure: LEFT KNEE  NEEDLE ASPIRATION;  Surgeon: Huel CoteBokshan, Steven, MD;  Location: [facility];  Service: Orthopedics;  Laterality: Left;   NASAL SEPTUM SURGERY  12/26/2015   ORIF ELBOW FRACTURE Right 12/09/2021   Procedure: OPEN REDUCTION INTERNAL FIXATION (ORIF) ELBOW/OLECRANON FRACTURE;  Surgeon: Huel CoteBokshan, Steven, MD;  Location: [facility];  Service: Orthopedics;  Laterality: Right;   pilondial cyst removal     TUBAL LIGATION     TURBINATE REDUCTION Bilateral 12/26/2015   Past Medical History:  Diagnosis Date   Abnormal Pap smear of cervix    h/o HPV   Chronic pain disorder    arthitis and fibromyalgia   Chronic sinusitis, chronic cough (?COPD) 07/16/2009   Depression    Diabetes mellitus    type 2   Dyslipidemia    Elevated LFTs 11/19/2011   Hypertension    Leukocytosis 11/19/2011   Menopausal syndrome    Neck pain 05/20/2011   Sarcoid    post partum in remission   Submandibular gland swelling     Right   BP (!) 147/79   Pulse 68   Ht 5\' 7"  (1.702 m)   Wt 172 lb 9.6 oz (78.3 kg)   SpO2 94%   BMI 27.03 kg/m   Opioid Risk Score:   Fall Risk Score:  `1  Depression screen PHQ 2/9     01/15/2022   11:00 AM 10/16/2021    9:23 AM 05/09/2021   10:16 AM 04/18/2021  2:00 PM 01/07/2021    9:56 AM 09/16/2020   11:27 AM 01/23/2020    9:14 AM  Depression screen PHQ 2/9  Decreased Interest 2 1 0 0 3 2 0  Down, Depressed, Hopeless 1 2 0 0 3 3 0  PHQ - 2 Score 3 3 0 0 6 5 0  Altered sleeping 1 0 3  3 2  0  Tired, decreased energy 1 1 3  3 3  0  Change in appetite 2 2 3  3 3  0  Feeling bad or failure about yourself  0 2 1  1 1  0  Trouble concentrating 0 2 1  1  0 0  Moving slowly or fidgety/restless 1 0 0  1 0 0  Suicidal thoughts 0 0 0  0 0 0  PHQ-9 Score 8 10 11  18 14  0  Difficult doing work/chores Somewhat difficult Very difficult   Somewhat difficult Very difficult Not difficult at all     Review of Systems  Musculoskeletal:  Positive for gait problem.       Spasms  Psychiatric/Behavioral:  Positive for dysphoric mood.   All other systems reviewed and are negative.     Objective:   Physical Exam  Today's Vitals   01/15/22 1058  BP: (!) 147/79  Pulse: 68  SpO2: 94%  Weight: 78.3 kg  Height: 5\' 7"  (1.702 m)   Body mass index is 27.03 kg/m.   Gen: no distress, normal appearing HEENT: oral mucosa pink and moist, NCAT Cardio: Reg rate Chest: normal effort, normal rate of breathing Abd: soft, non-distended Ext: no edema Psych: pleasant, normal affect Skin: intact Neuro: Alert and oriented follows commands answers questions cranial nerves II through XII grossly intact, sensation intact to light touch in all 4 extremities Musculoskeletal: No focal motor deficits noted.  Strength 5 out of 5 in left upper extremity and right lower extremity. Right hinged elbow brace. Hinged bledsoe knee brace LLE Sensation intact to all fingers of right hand.  No joint swelling or redness  noted.      Assessment & Plan:  1. Fall with multiple fractures, Right olecranon fracture s/p ORIF,Left radial head fracture, Left tibial plateau fracture.  I think she is doing very well overall             -continue PT,   -Continue use of right upper extremity and left lower extremity braces and weightbearing restrictions until cleared by Ortho.  She plans to follow-up with orthopedics next week   2. Postoperative pain -She continues on oxycodone for her PCP   3. UTI -Patient denies any UTI symptoms reports her GU function is back to baseline

## 2022-01-19 ENCOUNTER — Other Ambulatory Visit (HOSPITAL_BASED_OUTPATIENT_CLINIC_OR_DEPARTMENT_OTHER): Payer: Self-pay

## 2022-01-19 ENCOUNTER — Ambulatory Visit: Payer: BC Managed Care – PPO | Admitting: Internal Medicine

## 2022-01-21 ENCOUNTER — Other Ambulatory Visit (HOSPITAL_BASED_OUTPATIENT_CLINIC_OR_DEPARTMENT_OTHER): Payer: Self-pay | Admitting: Orthopaedic Surgery

## 2022-01-21 ENCOUNTER — Ambulatory Visit (INDEPENDENT_AMBULATORY_CARE_PROVIDER_SITE_OTHER): Payer: Worker's Compensation | Admitting: Orthopaedic Surgery

## 2022-01-21 ENCOUNTER — Ambulatory Visit (INDEPENDENT_AMBULATORY_CARE_PROVIDER_SITE_OTHER): Payer: BC Managed Care – PPO

## 2022-01-21 DIAGNOSIS — Z9889 Other specified postprocedural states: Secondary | ICD-10-CM

## 2022-01-21 NOTE — Progress Notes (Signed)
Post Operative Evaluation    Procedure/Date of Surgery: Right olecranon open reduction internal fixation 12/09/21  Interval History:   Alexandra Parsons presents 6 weeks status post right olecranon open reduction internal fixation.  She has been in her brace on the right elbow as well as the left knee.  She has been able to gently range the right elbow and denies any issues with this.  She does have pain in the elbow as well as the knee.  She has been on narcotic medications per her primary provider.  Denies any recent injuries or falls.   PMH/PSH/Family History/Social History/Meds/Allergies:    Past Medical History:  Diagnosis Date   Abnormal Pap smear of cervix    h/o HPV   Chronic pain disorder    arthitis and fibromyalgia   Chronic sinusitis, chronic cough (?COPD) 07/16/2009   Depression    Diabetes mellitus    type 2   Dyslipidemia    Elevated LFTs 11/19/2011   Hypertension    Leukocytosis 11/19/2011   Menopausal syndrome    Neck pain 05/20/2011   Sarcoid    post partum in remission   Submandibular gland swelling    Right   Past Surgical History:  Procedure Laterality Date   abdominal plasty remotely     [place] Left 12/09/2021   Procedure: LEFT KNEE  NEEDLE ASPIRATION;  Surgeon: Vanetta Mulders, MD;  Location: [facility];  Service: Orthopedics;  Laterality: Left;   NASAL SEPTUM SURGERY  12/26/2015   ORIF ELBOW FRACTURE Right 12/09/2021   Procedure: OPEN REDUCTION INTERNAL FIXATION (ORIF) ELBOW/OLECRANON FRACTURE;  Surgeon: Vanetta Mulders, MD;  Location: [place] [facility];  Service: Orthopedics;  Laterality: Right;   pilondial cyst removal     TUBAL LIGATION     TURBINATE REDUCTION Bilateral 12/26/2015   Social History   Socioeconomic History   Marital status: Widowed    Spouse name: Not on file   Number of children: 1   Years of education: Not on file   Highest education level: Not on file  Occupational History   Occupation: RN [employer]   Tobacco Use   Smoking status: Every Day    Packs/day: 1.00    Years: 28.00    Total pack years: 28.00    Types: Cigarettes   Smokeless tobacco: Never  Substance and Sexual Activity   Alcohol use: Yes    Alcohol/week: 0.0 standard drinks of alcohol    Comment: rare   Drug use: No   Sexual activity: Not on file  Other Topics Concern   Not on file  Social History Narrative   Lost husband, prostate cancer, 08-2012   Lives by herself   Has a daughter , lives in [place] Determinants of Health   Financial Resource Strain: Not on file  Food Insecurity: Not on file  Transportation Needs: Not on file  Physical Activity: Not on file  Stress: Not on file  Social Connections: Not on file   Family History  Problem Relation Age of Onset   Hypertension Other        GM   Colon cancer Other        COLON CA GGM x 2   Diabetes Other        aunt   CAD Maternal Grandfather 45   Stroke Neg Hx  Breast cancer Neg Hx    Allergies  Allergen Reactions   Benadryl [Diphenhydramine] Other (See Comments)    Pt states it makes her blood pressure drop   Erythromycin Other (See Comments)    palpitations; ZPACK is ok   Fluoxetine Hcl Other (See Comments)    hallucinations   Venlafaxine Other (See Comments)    made her feel bad, pt states it causes what feels like electric shocks    Current Outpatient Medications  Medication Sig Dispense Refill   aspirin EC 81 MG tablet Take 81 mg by mouth daily. Swallow whole.     blood glucose meter kit and supplies KIT Use up to 4 times daily as directed 1 each 0   Cholecalciferol (VITAMIN D3) 50 MCG (2000 UT) CAPS Take by mouth.     escitalopram (LEXAPRO) 20 MG tablet Take 1 tablet (20 mg total) by mouth daily. 30 tablet 1   ezetimibe (ZETIA) 10 MG tablet Take 1 tablet (10 mg total) by mouth daily. 30 tablet 0   glucose blood test strip Use as directed up to 4 times daily 100 each 0   Lancets MISC Use as directed 4 times daily 100 each 0    metFORMIN (GLUCOPHAGE) 1000 MG tablet Take 1,000 mg by mouth 2 (two) times daily with a meal.     methocarbamol (ROBAXIN) 500 MG tablet Take 1 tablet (500 mg total) by mouth every 6 (six) hours as needed for muscle spasms. 90 tablet 0   OVER THE COUNTER MEDICATION Emma 2 capsules daily for constipation     Oxycodone HCl 10 MG TABS Take 1 tablet (10 mg total) by mouth every 6 (six) hours as needed. 100 tablet 0   polyethylene glycol powder (GLYCOLAX/MIRALAX) 17 GM/SCOOP powder Mix 17 grams in 8 oz of liquid and drink by mouth daily as needed for mild constipation. 510 g 1   rosuvastatin (CRESTOR) 10 MG tablet Take 1 tablet (10 mg total) by mouth every Monday, Wednesday, and Friday. 12 tablet 5   traZODone (DESYREL) 50 MG tablet Take 1 tablet (50 mg total) by mouth at bedtime as needed for sleep. 15 tablet 0   No current facility-administered medications for this visit.   No results found.  Review of Systems:   A ROS was performed including pertinent positives and negatives as documented in the HPI.   Musculoskeletal Exam:    There were no vitals taken for this visit.  Right elbow incision is well-healed. There is some prominence about the plate posteriorly.  Range of motion about the elbow is from 100 degrees to 30 degrees.  She has full pro supination.  Regard to the left knee she has range of motion from 0 to 125 degrees without pain.  Left elbow range of motion is full and painless  Imaging:    Right elbow 2 views: Status post olecranon fixation without evidence of complication.  There is some healing.  I personally reviewed and interpreted the radiographs.   Assessment:   61 year old female with a nondisplaced left proximal tibia fracture as well as nondisplaced radial head fracture and a right olecranon fracture status post open reduction internal fixation.  With regard to the right elbow I do believe that there is enough healing that at this time I would advance her out of her  brace and allow her to use range of motion as tolerated about the elbow.  I have counseled her on the elbow becoming stiff if range of motion is not  procedurally and aggressively.  She will begin working on this with occupational therapy.  With regard to the left knee I do believe that she may be activity as tolerated and be out of her brace as well.  Plan :    -Return to clinic in 4 weeks for motion assessment of the right elbow      I personally saw and evaluated the patient, and participated in the management and treatment plan.  Vanetta Mulders, MD Attending Physician, Orthopedic Surgery  This document was dictated using Dragon voice recognition software. A reasonable attempt at proof reading has been made to minimize errors.

## 2022-01-27 ENCOUNTER — Telehealth: Payer: Self-pay | Admitting: Internal Medicine

## 2022-01-27 MED ORDER — OXYCODONE HCL 10 MG PO TABS
10.0000 mg | ORAL_TABLET | Freq: Four times a day (QID) | ORAL | 0 refills | Status: DC | PRN
  Filled 2022-01-27: qty 100, 25d supply, fill #0

## 2022-01-27 NOTE — Telephone Encounter (Signed)
Requesting: oxycodone 10mg   Contract: 09/16/20 UDS: 10/16/21 Last Visit: 01/01/22 Next Visit: None Last Refill: 01/01/22 #100 and 0RF  Please Advise

## 2022-01-27 NOTE — Telephone Encounter (Signed)
PDMP okay, Rx sent 

## 2022-01-28 ENCOUNTER — Other Ambulatory Visit (HOSPITAL_BASED_OUTPATIENT_CLINIC_OR_DEPARTMENT_OTHER): Payer: Self-pay

## 2022-02-06 ENCOUNTER — Telehealth: Payer: Self-pay | Admitting: Orthopaedic Surgery

## 2022-02-06 NOTE — Telephone Encounter (Signed)
Spoke to [place], PT, informed him patient should continue HHPT

## 2022-02-06 NOTE — Telephone Encounter (Signed)
Viviann Spare from [facility] called. He would like to know if patient should continue home PT or do outpatient? His call back number is [phone]

## 2022-02-11 ENCOUNTER — Ambulatory Visit (INDEPENDENT_AMBULATORY_CARE_PROVIDER_SITE_OTHER): Payer: BC Managed Care – PPO | Admitting: Internal Medicine

## 2022-02-11 ENCOUNTER — Other Ambulatory Visit (HOSPITAL_BASED_OUTPATIENT_CLINIC_OR_DEPARTMENT_OTHER): Payer: Self-pay

## 2022-02-11 VITALS — BP 140/80 | HR 81 | Temp 98.4°F | Resp 18 | Ht 67.0 in | Wt 164.4 lb

## 2022-02-11 DIAGNOSIS — E119 Type 2 diabetes mellitus without complications: Secondary | ICD-10-CM

## 2022-02-11 DIAGNOSIS — W19XXXD Unspecified fall, subsequent encounter: Secondary | ICD-10-CM | POA: Diagnosis not present

## 2022-02-11 MED ORDER — OXYCODONE-ACETAMINOPHEN 7.5-300 MG PO TABS
1.0000 | ORAL_TABLET | Freq: Four times a day (QID) | ORAL | Status: DC | PRN
Start: 2022-02-11 — End: 2022-02-23

## 2022-02-11 MED ORDER — METHOCARBAMOL 500 MG PO TABS
500.0000 mg | ORAL_TABLET | Freq: Four times a day (QID) | ORAL | 0 refills | Status: DC | PRN
  Filled 2022-02-11: qty 90, 23d supply, fill #0

## 2022-02-11 NOTE — Patient Instructions (Addendum)
Once you finish your supply of oxycodone, call and we will send oxycodone/acetaminophen 7.5/300 mg. Once you switch you do not need to take over-the-counter Tylenol   I refill Robaxin   Next visit in 2 months please make an appointment

## 2022-02-11 NOTE — Progress Notes (Signed)
Subjective:    Patient ID: Alexandra Parsons, female    DOB: 09-13-1960, 61 y.o.   MRN: [id number]  DOS:  02/11/2022 Type of visit - description: Follow-up  Follow-up from previous visit  Review of Systems See above   Past Medical History:  Diagnosis Date   Abnormal Pap smear of cervix    h/o HPV   Chronic pain disorder    arthitis and fibromyalgia   Chronic sinusitis, chronic cough (?COPD) 07/16/2009   Depression    Diabetes mellitus    type 2   Dyslipidemia    Elevated LFTs 11/19/2011   Hypertension    Leukocytosis 11/19/2011   Menopausal syndrome    Neck pain 05/20/2011   Sarcoid    post partum in remission   Submandibular gland swelling    Right    Past Surgical History:  Procedure Laterality Date   abdominal plasty remotely     FINE NEEDLE ASPIRATION Left 12/09/2021   Procedure: LEFT KNEE  NEEDLE ASPIRATION;  Surgeon: Huel Cote, MD;  Location: [facility];  Service: Orthopedics;  Laterality: Left;   NASAL SEPTUM SURGERY  12/26/2015   ORIF ELBOW FRACTURE Right 12/09/2021   Procedure: OPEN REDUCTION INTERNAL FIXATION (ORIF) ELBOW/OLECRANON FRACTURE;  Surgeon: Huel Cote, MD;  Location: [facility];  Service: Orthopedics;  Laterality: Right;   pilondial cyst removal     TUBAL LIGATION     TURBINATE REDUCTION Bilateral 12/26/2015    Current Outpatient Medications  Medication Instructions   aspirin EC 81 mg, Oral, Daily, Swallow whole.   Bisoprolol-hydroCHLOROthiazide (ZIAC PO) Oral   blood glucose meter kit and supplies KIT Use up to 4 times daily as directed   Cholecalciferol (VITAMIN D3) 50 MCG (2000 UT) CAPS Oral   escitalopram (LEXAPRO) 20 mg, Oral, Daily   ezetimibe (ZETIA) 10 mg, Oral, Daily   glucose blood test strip Use as directed up to 4 times daily   Lancets MISC Use as directed 4 times daily   metFORMIN (GLUCOPHAGE) 1,000 mg, Oral, 2 times daily with meals   methocarbamol (ROBAXIN) 500 mg, Oral, Every 6 hours PRN   oxycodone-acetaminophen (LYNOX) 7.5-300  MG tablet 1 tablet, Oral, Every 6 hours PRN   pioglitazone (ACTOS) 30 mg, Oral, Daily   rosuvastatin (CRESTOR) 10 mg, Oral, Every M-W-F   sitaGLIPtin (JANUVIA) 100 mg, Oral, Daily   traZODone (DESYREL) 50 mg, Oral, At bedtime PRN       Objective:   Physical Exam BP 140/80 (BP Location: Left Arm, Patient Position: Sitting, Cuff Size: Normal)   Pulse 81   Temp 98.4 F (36.9 C) (Oral)   Resp 18   Ht 5\' 7"  (1.702 m)   Wt 164 lb 6.4 oz (74.6 kg)   SpO2 97%   BMI 25.75 kg/m  General:   Well developed, NAD, BMI noted. HEENT:  Normocephalic . Face symmetric, atraumatic MSK: Unable to fully extend right arm. No lower extremity edema Skin: Not pale. Not jaundice Neurologic:  alert & oriented X3.  Speech normal, gait appropriate for age and unassisted Psych--  Cognition and judgment appear intact.  Cooperative with normal attention span and concentration.  Behavior appropriate. No anxious or depressed appearing.      Assessment    Assessment DM HTN Dyslipidemia: intolerant to zocor ~ 2014 and  Lipitor 04/2018 Vit d def   CT 09-2018 ---COPD ---Pulmonary nodules, declined further CTs on 05-2019 --- (+)  Coronary calcium   Smoker ~1  ppd Psych: --Depression anxiety --ADHD MSK:  Fibromyalgia  (see OV 01/07/2021, 05/09/2021) Neck pain, chronic, on pain meds prn, UDS 09-2013 risk; intolerant to vicodin, oxycodone ok GYN: --BTL, Menopausal , h/o  Abnormal Pap smear, HPV Elevated LFTs: Hepatitis serology (-) 2008 H/o Sarcoid postpartum in remission H/o Chronic cough: Chronic sinusitis? COPD? H/o Leukocytosis  H/o +  PPD   PLAN Polytrauma: Was in the hospital few weeks ago, had a polytrauma, she is not back at work but is more independent in all her ADLs. She has been informed that [employer]. will not take care of her treatment and she is concerned about it. Current pain management includes: Oxycodone 10 mg 4 times daily, Robaxin, OTC Tylenol.  Reports she is not  oversedated.  She tried to take oxycodone 10 mg 1/2 TAB qid but it was not enough. Plan: -Once she finishes oxycodone 10 mg, will change to Percocet 7.5/300 mg of acetaminophen 4 times daily.  - -She is aware she needs to stop Tylenol at that point. RF Robaxin DM: A1c a month ago was 5.7, at that time was only taking one medication but CBGs went to the 200s,thus she restarted all her meds:  metformin, Januvia, Actos.  CBGs now in the 130s. RTC 2 months

## 2022-02-12 ENCOUNTER — Encounter: Payer: Self-pay | Admitting: Internal Medicine

## 2022-02-12 NOTE — Assessment & Plan Note (Signed)
Polytrauma: Was in the hospital few weeks ago, had a polytrauma, she is not back at work but is more independent in all her ADLs. She has been informed that [employer]. will not take care of her treatment and she is concerned about it. Current pain management includes: Oxycodone 10 mg 4 times daily, Robaxin, OTC Tylenol.  Reports she is not oversedated.  She tried to take oxycodone 10 mg 1/2 TAB qid but it was not enough. Plan: -Once she finishes oxycodone 10 mg, will change to Percocet 7.5/300 mg of acetaminophen 4 times daily.  - -She is aware she needs to stop Tylenol at that point. RF Robaxin DM: A1c a month ago was 5.7, at that time was only taking one medication but CBGs went to the 200s,thus she restarted all her meds:  metformin, Januvia, Actos.  CBGs now in the 130s. RTC 2 months

## 2022-02-23 ENCOUNTER — Encounter (HOSPITAL_BASED_OUTPATIENT_CLINIC_OR_DEPARTMENT_OTHER): Payer: BC Managed Care – PPO | Admitting: Orthopaedic Surgery

## 2022-02-23 ENCOUNTER — Telehealth: Payer: Self-pay | Admitting: Internal Medicine

## 2022-02-23 ENCOUNTER — Other Ambulatory Visit (HOSPITAL_BASED_OUTPATIENT_CLINIC_OR_DEPARTMENT_OTHER): Payer: Self-pay

## 2022-02-23 MED ORDER — OXYCODONE-ACETAMINOPHEN 7.5-300 MG PO TABS
1.0000 | ORAL_TABLET | Freq: Four times a day (QID) | ORAL | 0 refills | Status: DC | PRN
  Filled 2022-02-23 – 2022-02-24 (×2): qty 100, 25d supply, fill #0

## 2022-02-23 NOTE — Telephone Encounter (Signed)
Pt called stating that the acetaminophen amount for this medication was supposed to be 500mg  instead of 300 and needs to be rewritten as such:  Medication:   oxycodone-acetaminophen (LYNOX) 7.5-300 MG tablet 11-27-1987  Has the patient contacted their pharmacy? No. (If no, request that the patient contact the pharmacy for the refill.) (If yes, when and what did the pharmacy advise?)  Preferred Pharmacy (with phone number or street name):   [facility]  [address] [place]  Phone:  [phone]  Fax:  [phone]   Agent: Please be advised that RX refills may take up to 3 business days. We ask that you follow-up with your pharmacy.

## 2022-02-23 NOTE — Telephone Encounter (Signed)
Prescription for oxycodone acetaminophen 7.5/300 mg sent. This was discussed with the patient at the time of the visit, she does not need to OTC Tylenol anymore. PDMP okay

## 2022-02-24 ENCOUNTER — Other Ambulatory Visit: Payer: Self-pay | Admitting: Internal Medicine

## 2022-02-24 ENCOUNTER — Other Ambulatory Visit (HOSPITAL_BASED_OUTPATIENT_CLINIC_OR_DEPARTMENT_OTHER): Payer: Self-pay

## 2022-02-24 MED ORDER — OXYCODONE-ACETAMINOPHEN 7.5-325 MG PO TABS
1.0000 | ORAL_TABLET | Freq: Four times a day (QID) | ORAL | 0 refills | Status: DC | PRN
  Filled 2022-02-24: qty 100, 25d supply, fill #0

## 2022-02-25 ENCOUNTER — Encounter: Payer: Self-pay | Admitting: Internal Medicine

## 2022-02-25 ENCOUNTER — Ambulatory Visit (INDEPENDENT_AMBULATORY_CARE_PROVIDER_SITE_OTHER): Payer: BC Managed Care – PPO

## 2022-02-25 ENCOUNTER — Ambulatory Visit (INDEPENDENT_AMBULATORY_CARE_PROVIDER_SITE_OTHER): Payer: BC Managed Care – PPO | Admitting: Orthopaedic Surgery

## 2022-02-25 ENCOUNTER — Telehealth: Payer: Self-pay | Admitting: Orthopaedic Surgery

## 2022-02-25 DIAGNOSIS — Z9889 Other specified postprocedural states: Secondary | ICD-10-CM

## 2022-02-25 NOTE — Progress Notes (Signed)
Post Operative Evaluation    Procedure/Date of Surgery: Right olecranon open reduction internal fixation 12/09/21  Interval History:   Amarra presents status post right olecranon open reduction internal fixation also with a nondisplaced tibial plateau fracture on the left overall doing very well.  She does state that she has some crepitus in the left knee although this is overall continuing to improve and not painful.  With regard to the right elbow she has been working on physical therapy for range of motion although she is somewhat frustrated with the progress in this.  She denies any pain in the left elbow.  This is completely normalized.   PMH/PSH/Family History/Social History/Meds/Allergies:    Past Medical History:  Diagnosis Date   Abnormal Pap smear of cervix    h/o HPV   Chronic pain disorder    arthitis and fibromyalgia   Chronic sinusitis, chronic cough (?COPD) 07/16/2009   Depression    Diabetes mellitus    type 2   Dyslipidemia    Elevated LFTs 11/19/2011   Hypertension    Leukocytosis 11/19/2011   Menopausal syndrome    Neck pain 05/20/2011   Sarcoid    post partum in remission   Submandibular gland swelling    Right   Past Surgical History:  Procedure Laterality Date   abdominal plasty remotely     [place] Left 12/09/2021   Procedure: LEFT KNEE  NEEDLE ASPIRATION;  Surgeon: Vanetta Mulders, MD;  Location: [facility];  Service: Orthopedics;  Laterality: Left;   NASAL SEPTUM SURGERY  12/26/2015   ORIF ELBOW FRACTURE Right 12/09/2021   Procedure: OPEN REDUCTION INTERNAL FIXATION (ORIF) ELBOW/OLECRANON FRACTURE;  Surgeon: Vanetta Mulders, MD;  Location: [facility];  Service: Orthopedics;  Laterality: Right;   pilondial cyst removal     TUBAL LIGATION     TURBINATE REDUCTION Bilateral 12/26/2015   Social History   Socioeconomic History   Marital status: Widowed    Spouse name: Not on file   Number of children: 1   Years of  education: Not on file   Highest education level: Not on file  Occupational History   Occupation: RN [employer]  Tobacco Use   Smoking status: Every Day    Packs/day: 1.00    Years: 28.00    Total pack years: 28.00    Types: Cigarettes   Smokeless tobacco: Never  Substance and Sexual Activity   Alcohol use: Yes    Alcohol/week: 0.0 standard drinks of alcohol    Comment: rare   Drug use: No   Sexual activity: Not on file  Other Topics Concern   Not on file  Social History Narrative   Lost husband, prostate cancer, 08-2012   Lives by herself   Has a daughter , lives in [place] Determinants of Health   Financial Resource Strain: Not on file  Food Insecurity: Not on file  Transportation Needs: Not on file  Physical Activity: Not on file  Stress: Not on file  Social Connections: Not on file   Family History  Problem Relation Age of Onset   Hypertension Other        GM   Colon cancer Other        COLON CA GGM x 2   Diabetes Other  aunt   CAD Maternal Grandfather 8   Stroke Neg Hx    Breast cancer Neg Hx    Allergies  Allergen Reactions   Benadryl [Diphenhydramine] Other (See Comments)    Pt states it makes her blood pressure drop   Erythromycin Other (See Comments)    palpitations; ZPACK is ok   Fluoxetine Hcl Other (See Comments)    hallucinations   Venlafaxine Other (See Comments)    made her feel bad, pt states it causes what feels like electric shocks    Current Outpatient Medications  Medication Sig Dispense Refill   aspirin EC 81 MG tablet Take 81 mg by mouth daily. Swallow whole.     Bisoprolol-hydroCHLOROthiazide (ZIAC PO) Take by mouth.     blood glucose meter kit and supplies KIT Use up to 4 times daily as directed 1 each 0   Cholecalciferol (VITAMIN D3) 50 MCG (2000 UT) CAPS Take by mouth.     escitalopram (LEXAPRO) 20 MG tablet Take 1 tablet (20 mg total) by mouth daily. 30 tablet 1   ezetimibe (ZETIA) 10 MG tablet Take 1 tablet (10 mg  total) by mouth daily. 30 tablet 0   glucose blood test strip Use as directed up to 4 times daily 100 each 0   Lancets MISC Use as directed 4 times daily 100 each 0   metFORMIN (GLUCOPHAGE) 1000 MG tablet Take 1,000 mg by mouth 2 (two) times daily with a meal.     methocarbamol (ROBAXIN) 500 MG tablet Take 1 tablet (500 mg total) by mouth every 6 (six) hours as needed for muscle spasms. 90 tablet 0   oxyCODONE-acetaminophen (PERCOCET) 7.5-325 MG tablet Take 1 tablet by mouth every 6 (six) hours as needed for severe pain. 100 tablet 0   pioglitazone (ACTOS) 30 MG tablet Take 30 mg by mouth daily.     rosuvastatin (CRESTOR) 10 MG tablet Take 1 tablet (10 mg total) by mouth every Monday, Wednesday, and Friday. 12 tablet 5   sitaGLIPtin (JANUVIA) 100 MG tablet Take 100 mg by mouth daily.     traZODone (DESYREL) 50 MG tablet Take 1 tablet (50 mg total) by mouth at bedtime as needed for sleep. 15 tablet 0   No current facility-administered medications for this visit.   No results found.  Review of Systems:   A ROS was performed including pertinent positives and negatives as documented in the HPI.   Musculoskeletal Exam:    There were no vitals taken for this visit.  Right elbow incision is well-healed. There is some prominence about the plate posteriorly.  Range of motion about the elbow is from 15 to 130 degrees.  She has full pro supination.  Regard to the left knee she has range of motion from 0 to 125 degrees without pain.  Left elbow range of motion is full and painless    Imaging:    Right elbow 2 views: Status post olecranon fixation without evidence of complication.  There is callus formation.  I personally reviewed and interpreted the radiographs.   Assessment:   61 year old female with a nondisplaced left proximal tibia fracture as well as nondisplaced radial head fracture and a right olecranon fracture status post open reduction internal fixation.  At this time she is  continue to work with physical therapy for right elbow range of motion.  I did describe that unfortunately she does lack some full extension as well as flexion.  I would like her to continue working on this.  We did discuss the role for ultimate plate removal as she continues to experience some soreness although she is only 3 months out at this time and as result I believe this would be early for that.  She will continue to work with her physical therapist for range of motion of the right elbow as well as strengthening of the left leg.  I will plan to see her back in 2 months for reassessment  Plan :    -Return to clinic in 2 months for reassessment     I personally saw and evaluated the patient, and participated in the management and treatment plan.  Vanetta Mulders, MD Attending Physician, Orthopedic Surgery  This document was dictated using Dragon voice recognition software. A reasonable attempt at proof reading has been made to minimize errors.

## 2022-02-25 NOTE — Telephone Encounter (Signed)
FMLA Source forms received. To [employer]. 

## 2022-03-04 ENCOUNTER — Telehealth: Payer: Self-pay | Admitting: Orthopaedic Surgery

## 2022-03-04 NOTE — Telephone Encounter (Signed)
FMLA Source forms received. To [employer]. 

## 2022-03-09 ENCOUNTER — Other Ambulatory Visit (HOSPITAL_BASED_OUTPATIENT_CLINIC_OR_DEPARTMENT_OTHER): Payer: Self-pay

## 2022-03-09 ENCOUNTER — Other Ambulatory Visit: Payer: Self-pay | Admitting: Internal Medicine

## 2022-03-09 MED ORDER — METHOCARBAMOL 500 MG PO TABS
500.0000 mg | ORAL_TABLET | Freq: Four times a day (QID) | ORAL | 0 refills | Status: DC | PRN
  Filled 2022-03-09: qty 90, 23d supply, fill #0

## 2022-03-23 ENCOUNTER — Telehealth: Payer: Self-pay | Admitting: Internal Medicine

## 2022-03-23 ENCOUNTER — Other Ambulatory Visit (HOSPITAL_BASED_OUTPATIENT_CLINIC_OR_DEPARTMENT_OTHER): Payer: Self-pay

## 2022-03-23 MED ORDER — OXYCODONE-ACETAMINOPHEN 7.5-325 MG PO TABS
1.0000 | ORAL_TABLET | Freq: Four times a day (QID) | ORAL | 0 refills | Status: DC | PRN
  Filled 2022-03-23: qty 120, 30d supply, fill #0

## 2022-03-23 MED ORDER — METHOCARBAMOL 500 MG PO TABS
500.0000 mg | ORAL_TABLET | Freq: Four times a day (QID) | ORAL | 0 refills | Status: DC | PRN
Start: 2022-03-23 — End: 2022-05-11
  Filled 2022-03-23 – 2022-04-08 (×2): qty 90, 23d supply, fill #0

## 2022-03-23 NOTE — Telephone Encounter (Signed)
Requesting: oxycodone 7.5-325mg   Contract:09/16/20 UDS: 10/16/21 Last Visit: 02/11/22 Next Visit: None Last Refill: 02/24/22 #100 and 0RF  Please Advise

## 2022-03-23 NOTE — Telephone Encounter (Signed)
Medication: oxyCODONE-acetaminophen (PERCOCET) 7.5-325 MG tablet  methocarbamol (ROBAXIN) 500 MG tablet   Has the patient contacted their pharmacy? No.  Preferred Pharmacy:  [facility]   [address], [address], [place] [address]  Phone:  [phone]  Fax:  [phone]  DEA #:  [id number]

## 2022-03-23 NOTE — Telephone Encounter (Signed)
We are switching from oxycodone to Percocet, see last visit, she is aware she should not mix it with Tylenol. PDMP okay, prescription sent.

## 2022-03-30 ENCOUNTER — Telehealth: Payer: Self-pay | Admitting: Orthopaedic Surgery

## 2022-03-30 NOTE — Telephone Encounter (Signed)
FMLA Source forms received. To [employer]. 

## 2022-04-06 ENCOUNTER — Encounter: Payer: Self-pay | Admitting: Internal Medicine

## 2022-04-08 ENCOUNTER — Other Ambulatory Visit (HOSPITAL_BASED_OUTPATIENT_CLINIC_OR_DEPARTMENT_OTHER): Payer: Self-pay

## 2022-04-08 ENCOUNTER — Other Ambulatory Visit: Payer: Self-pay | Admitting: Internal Medicine

## 2022-04-09 ENCOUNTER — Other Ambulatory Visit (HOSPITAL_BASED_OUTPATIENT_CLINIC_OR_DEPARTMENT_OTHER): Payer: Self-pay

## 2022-04-09 MED ORDER — PIOGLITAZONE HCL 30 MG PO TABS
30.0000 mg | ORAL_TABLET | Freq: Every day | ORAL | 1 refills | Status: DC
  Filled 2022-04-09: qty 90, 90d supply, fill #0

## 2022-04-09 MED ORDER — TRAZODONE HCL 100 MG PO TABS
100.0000 mg | ORAL_TABLET | Freq: Every evening | ORAL | 0 refills | Status: DC | PRN
  Filled 2022-04-09: qty 90, 90d supply, fill #0

## 2022-04-09 MED ORDER — SITAGLIPTIN PHOSPHATE 100 MG PO TABS
100.0000 mg | ORAL_TABLET | Freq: Every day | ORAL | 1 refills | Status: DC
  Filled 2022-04-09: qty 90, 90d supply, fill #0

## 2022-04-09 MED ORDER — METFORMIN HCL 1000 MG PO TABS
1000.0000 mg | ORAL_TABLET | Freq: Two times a day (BID) | ORAL | 1 refills | Status: DC
  Filled 2022-04-09: qty 180, 90d supply, fill #0

## 2022-04-10 ENCOUNTER — Other Ambulatory Visit (HOSPITAL_BASED_OUTPATIENT_CLINIC_OR_DEPARTMENT_OTHER): Payer: Self-pay

## 2022-04-17 ENCOUNTER — Other Ambulatory Visit: Payer: Self-pay

## 2022-04-20 ENCOUNTER — Encounter: Payer: Self-pay | Admitting: Internal Medicine

## 2022-04-20 ENCOUNTER — Other Ambulatory Visit (HOSPITAL_BASED_OUTPATIENT_CLINIC_OR_DEPARTMENT_OTHER): Payer: Self-pay

## 2022-04-20 ENCOUNTER — Ambulatory Visit (INDEPENDENT_AMBULATORY_CARE_PROVIDER_SITE_OTHER): Payer: BC Managed Care – PPO | Admitting: Internal Medicine

## 2022-04-20 VITALS — BP 126/84 | HR 77 | Temp 98.5°F | Resp 18 | Ht 67.0 in | Wt 168.1 lb

## 2022-04-20 DIAGNOSIS — I10 Essential (primary) hypertension: Secondary | ICD-10-CM

## 2022-04-20 DIAGNOSIS — Z23 Encounter for immunization: Secondary | ICD-10-CM | POA: Diagnosis not present

## 2022-04-20 DIAGNOSIS — M542 Cervicalgia: Secondary | ICD-10-CM

## 2022-04-20 DIAGNOSIS — E119 Type 2 diabetes mellitus without complications: Secondary | ICD-10-CM

## 2022-04-20 MED ORDER — OXYCODONE-ACETAMINOPHEN 7.5-325 MG PO TABS
1.0000 | ORAL_TABLET | Freq: Four times a day (QID) | ORAL | 0 refills | Status: DC | PRN
  Filled 2022-04-20 – 2022-04-24 (×2): qty 120, 30d supply, fill #0

## 2022-04-20 MED ORDER — METFORMIN HCL 1000 MG PO TABS: 1000.0000 mg | ORAL_TABLET | Freq: Every day | ORAL | Status: DC

## 2022-04-20 NOTE — Progress Notes (Signed)
Subjective:    Patient ID: Alexandra Parsons, female    DOB: March 06, 1961, 60 y.o.   MRN: [id number]  DOS:  04/20/2022 Type of visit - description: f/u  Today we talk about her chronic medical problems including diabetes, hypertension and pain management. She has self adjusted her medications. Ambulatory CBGs ambulatory BPs are satisfactory.  Wt Readings from Last 3 Encounters:  04/20/22 168 lb 2 oz (76.3 kg)  02/11/22 164 lb 6.4 oz (74.6 kg)  01/15/22 172 lb 9.6 oz (78.3 kg)     Review of Systems See above   Past Medical History:  Diagnosis Date   Abnormal Pap smear of cervix    h/o HPV   Chronic pain disorder    arthitis and fibromyalgia   Chronic sinusitis, chronic cough (?COPD) 07/16/2009   Depression    Diabetes mellitus    type 2   Dyslipidemia    Elevated LFTs 11/19/2011   Hypertension    Leukocytosis 11/19/2011   Menopausal syndrome    Neck pain 05/20/2011   Sarcoid    post partum in remission   Submandibular gland swelling    Right    Past Surgical History:  Procedure Laterality Date   abdominal plasty remotely     [place] Left 12/09/2021   Procedure: LEFT KNEE  NEEDLE ASPIRATION;  Surgeon: Vanetta Mulders, MD;  Location: [facility];  Service: Orthopedics;  Laterality: Left;   NASAL SEPTUM SURGERY  12/26/2015   ORIF ELBOW FRACTURE Right 12/09/2021   Procedure: OPEN REDUCTION INTERNAL FIXATION (ORIF) ELBOW/OLECRANON FRACTURE;  Surgeon: Vanetta Mulders, MD;  Location: [facility];  Service: Orthopedics;  Laterality: Right;   pilondial cyst removal     TUBAL LIGATION     TURBINATE REDUCTION Bilateral 12/26/2015    Current Outpatient Medications  Medication Instructions   aspirin EC 81 mg, Oral, Daily, Swallow whole.   Bisoprolol-hydroCHLOROthiazide (ZIAC PO) Oral   blood glucose meter kit and supplies KIT Use up to 4 times daily as directed   Cholecalciferol (VITAMIN D3) 50 MCG (2000 UT) CAPS Oral   escitalopram (LEXAPRO) 20 mg, Oral, Daily   ezetimibe  (ZETIA) 10 mg, Oral, Daily   glucose blood test strip Use as directed up to 4 times daily   Januvia 100 mg, Oral, Daily   Lancets MISC Use as directed 4 times daily   metFORMIN (GLUCOPHAGE) 1,000 mg, Oral, 2 times daily with meals   methocarbamol (ROBAXIN) 500 mg, Oral, Every 6 hours PRN   oxyCODONE-acetaminophen (PERCOCET) 7.5-325 MG tablet Take 1 tablet by mouth every 6 (six) hours as needed for severe pain. Do NOT take with tylenol   pioglitazone (ACTOS) 30 mg, Oral, Daily   rosuvastatin (CRESTOR) 10 mg, Oral, Every M-W-F   traZODone (DESYREL) 100 mg, Oral, At bedtime PRN       Objective:   Physical Exam BP 126/84   Pulse 77   Temp 98.5 F (36.9 C) (Oral)   Resp 18   Ht _0  (1.702 m)   Wt 168 lb 2 oz (76.3 kg)   SpO2 98%   BMI 26.33 kg/m  General:   Well developed, NAD, BMI noted. HEENT:  Normocephalic . Face symmetric, atraumatic Lungs:  CTA B Normal respiratory effort, no intercostal retractions, no accessory muscle use. Heart: RRR,  no murmur.  Lower extremities: no pretibial edema bilaterally  Skin: Not pale. Not jaundice Neurologic:  alert & oriented X3.  Speech normal, gait appropriate for age and unassisted Psych--  Cognition and  judgment appear intact.  Cooperative with normal attention span and concentration.  Behavior appropriate. No anxious or depressed appearing.      Assessment    Assessment DM HTN Dyslipidemia: intolerant to zocor ~ 2014 and  Lipitor 04/2018 Vit d def   CT 09-2018 ---COPD ---Pulmonary nodules, declined further CTs on 05-2019 --- (+)  Coronary calcium   Smoker ~1  ppd Psych: --Depression anxiety --ADHD MSK:  Fibromyalgia  (see OV 01/07/2021, 05/09/2021) Neck pain, chronic, on pain meds prn, UDS 09-2013 risk; intolerant to vicodin, oxycodone ok Fall 12/2021: R elbow, L knee fracture GYN: --BTL, Menopausal , h/o  Abnormal Pap smear, HPV Elevated LFTs: Hepatitis serology (-) 2008 H/o Sarcoid postpartum in remission H/o  Chronic cough: Chronic sinusitis? COPD? H/o Leukocytosis  H/o +  PPD   PLAN DM: On Januvia, Actos and metformin.  She was supposed to be on metformin 2000 MGs daily but is only taking 1000 MGs.  Ambulatory CBGs range from 102-120.  We will check A1c.  Cont metformin 1000 mg qd HTN:   on Ziac, check a BMP.  Adjust medicines if needed Pain management:  Currently on Percocet 7.5/300 up to QID prn for the treatment of chronic neck pain and also R elbow -L knee pain related to fractures 12/2021. PDMP reviewed, RF sent.  Also on Robaxin Social: Still not working due to recent injuries in May, she doubts she will be able to work. Preventive care: Shingrix No. 2 and flu shot today RTC 3 months

## 2022-04-20 NOTE — Patient Instructions (Addendum)
Recommend to proceed with the following vaccines at your pharmacy:  Covid booster (bivalent)    Check the  blood pressure regularly BP GOAL is between 110/65 and  135/85. If it is consistently higher or lower, let me know  Diabetes: Check your blood sugar at different times  - early in AM fasting  ( goal 70-130) - 2 hours after a meal (goal less than 180)     GO TO THE LAB : Get the blood work     GO TO THE FRONT DESK, PLEASE SCHEDULE YOUR APPOINTMENTS Come back for a physical exam in 3 months

## 2022-04-20 NOTE — Assessment & Plan Note (Signed)
DM: On Januvia, Actos and metformin.  She was supposed to be on metformin 2000 MGs daily but is only taking 1000 MGs.  Ambulatory CBGs range from 102-120.  We will check A1c.  Cont metformin 1000 mg qd HTN:   on Ziac, check a BMP.  Adjust medicines if needed Pain management:  Currently on Percocet 7.5/300 up to QID prn for the treatment of chronic neck pain and also R elbow -L knee pain related to fractures 12/2021. PDMP reviewed, RF sent.  Also on Robaxin Social: Still not working due to recent injuries in May, she doubts she will be able to work. Preventive care: Shingrix No. 2 and flu shot today RTC 3 months

## 2022-04-21 LAB — BASIC METABOLIC PANEL WITH GFR
BUN: 15 mg/dL (ref 6–23)
CO2: 27 meq/L (ref 19–32)
Calcium: 9.1 mg/dL (ref 8.4–10.5)
Chloride: 106 meq/L (ref 96–112)
Creatinine, Ser: 0.78 mg/dL (ref 0.40–1.20)
GFR: 81.64 mL/min
Glucose, Bld: 119 mg/dL — ABNORMAL HIGH (ref 70–99)
Potassium: 4.4 meq/L (ref 3.5–5.1)
Sodium: 141 meq/L (ref 135–145)

## 2022-04-21 LAB — HEMOGLOBIN A1C: Hgb A1c MFr Bld: 6.1 % (ref 4.6–6.5)

## 2022-04-22 ENCOUNTER — Ambulatory Visit (INDEPENDENT_AMBULATORY_CARE_PROVIDER_SITE_OTHER): Payer: BC Managed Care – PPO

## 2022-04-22 ENCOUNTER — Telehealth: Payer: Self-pay | Admitting: Orthopaedic Surgery

## 2022-04-22 ENCOUNTER — Other Ambulatory Visit (HOSPITAL_BASED_OUTPATIENT_CLINIC_OR_DEPARTMENT_OTHER): Payer: Self-pay | Admitting: Orthopaedic Surgery

## 2022-04-22 ENCOUNTER — Ambulatory Visit (INDEPENDENT_AMBULATORY_CARE_PROVIDER_SITE_OTHER): Payer: BC Managed Care – PPO | Admitting: Orthopaedic Surgery

## 2022-04-22 DIAGNOSIS — Z9889 Other specified postprocedural states: Secondary | ICD-10-CM

## 2022-04-22 NOTE — Telephone Encounter (Signed)
Patient called asked if she can get another note extending her time out of work for the next 3 months. The number to contact patient is [phone]

## 2022-04-22 NOTE — Telephone Encounter (Signed)
Called patient and informed her note is ready for pick up

## 2022-04-22 NOTE — Progress Notes (Signed)
Post Operative Evaluation    Procedure/Date of Surgery: Right olecranon open reduction internal fixation 12/09/21  Interval History:    Alexandra Parsons presents today now over 4 months out from the above procedure.  Overall she is continue to make improvement.  Flexion of the right elbow is dramatically improved.  The elbow is moving fluidly although there is now loss of extension.  She states that she can feel her hardware specifically when she is resting the arm down but this has become quite bothersome to her.  She is here today hoping to discuss possible removal of hardware.   PMH/PSH/Family History/Social History/Meds/Allergies:    Past Medical History:  Diagnosis Date   Abnormal Pap smear of cervix    h/o HPV   Chronic pain disorder    arthitis and fibromyalgia   Chronic sinusitis, chronic cough (?COPD) 07/16/2009   Depression    Diabetes mellitus    type 2   Dyslipidemia    Elevated LFTs 11/19/2011   Hypertension    Leukocytosis 11/19/2011   Menopausal syndrome    Neck pain 05/20/2011   Sarcoid    post partum in remission   Submandibular gland swelling    Right   Past Surgical History:  Procedure Laterality Date   abdominal plasty remotely     [place] Left 12/09/2021   Procedure: LEFT KNEE  NEEDLE ASPIRATION;  Surgeon: Vanetta Mulders, MD;  Location: [facility];  Service: Orthopedics;  Laterality: Left;   NASAL SEPTUM SURGERY  12/26/2015   ORIF ELBOW FRACTURE Right 12/09/2021   Procedure: OPEN REDUCTION INTERNAL FIXATION (ORIF) ELBOW/OLECRANON FRACTURE;  Surgeon: Vanetta Mulders, MD;  Location: [facility];  Service: Orthopedics;  Laterality: Right;   pilondial cyst removal     TUBAL LIGATION     TURBINATE REDUCTION Bilateral 12/26/2015   Social History   Socioeconomic History   Marital status: Widowed    Spouse name: Not on file   Number of children: 1   Years of education: Not on file   Highest education level: Not on file   Occupational History   Occupation: RN [employer]  Tobacco Use   Smoking status: Every Day    Packs/day: 1.00    Years: 28.00    Total pack years: 28.00    Types: Cigarettes   Smokeless tobacco: Never  Substance and Sexual Activity   Alcohol use: Yes    Alcohol/week: 0.0 standard drinks of alcohol    Comment: rare   Drug use: No   Sexual activity: Not on file  Other Topics Concern   Not on file  Social History Narrative   Lost husband, prostate cancer, 08-2012   Lives by herself   Has a daughter , lives in [place] Determinants of Health   Financial Resource Strain: Not on file  Food Insecurity: Not on file  Transportation Needs: Not on file  Physical Activity: Not on file  Stress: Not on file  Social Connections: Not on file   Family History  Problem Relation Age of Onset   Hypertension Other        GM   Colon cancer Other        COLON CA GGM x 2   Diabetes Other        aunt   CAD Maternal Grandfather 62  Stroke Neg Hx    Breast cancer Neg Hx    Allergies  Allergen Reactions   Benadryl [Diphenhydramine] Other (See Comments)    Pt states it makes her blood pressure drop   Erythromycin Other (See Comments)    palpitations; ZPACK is ok   Fluoxetine Hcl Other (See Comments)    hallucinations   Venlafaxine Other (See Comments)    made her feel bad, pt states it causes what feels like electric shocks    Current Outpatient Medications  Medication Sig Dispense Refill   aspirin EC 81 MG tablet Take 81 mg by mouth daily. Swallow whole.     Bisoprolol-hydroCHLOROthiazide (ZIAC PO) Take by mouth.     blood glucose meter kit and supplies KIT Use up to 4 times daily as directed 1 each 0   Cholecalciferol (VITAMIN D3) 50 MCG (2000 UT) CAPS Take by mouth.     escitalopram (LEXAPRO) 20 MG tablet Take 1 tablet (20 mg total) by mouth daily. 30 tablet 1   ezetimibe (ZETIA) 10 MG tablet Take 1 tablet (10 mg total) by mouth daily. 30 tablet 0   glucose blood test strip Use  as directed up to 4 times daily 100 each 0   Lancets MISC Use as directed 4 times daily 100 each 0   metFORMIN (GLUCOPHAGE) 1000 MG tablet Take 1 tablet (1,000 mg total) by mouth daily with breakfast.     methocarbamol (ROBAXIN) 500 MG tablet Take 1 tablet (500 mg total) by mouth every 6 (six) hours as needed for muscle spasms. 90 tablet 0   oxyCODONE-acetaminophen (PERCOCET) 7.5-325 MG tablet Take 1 tablet by mouth every 6 (six) hours as needed for severe pain. Do NOT take with tylenol 120 tablet 0   pioglitazone (ACTOS) 30 MG tablet Take 1 tablet (30 mg total) by mouth daily. 90 tablet 1   rosuvastatin (CRESTOR) 10 MG tablet Take 1 tablet (10 mg total) by mouth every Monday, Wednesday, and Friday. 12 tablet 5   sitaGLIPtin (JANUVIA) 100 MG tablet Take 1 tablet (100 mg total) by mouth daily. 90 tablet 1   traZODone (DESYREL) 100 MG tablet Take 1 tablet (100 mg total) by mouth at bedtime as needed for sleep. 90 tablet 0   No current facility-administered medications for this visit.   No results found.  Review of Systems:   A ROS was performed including pertinent positives and negatives as documented in the HPI.   Musculoskeletal Exam:    There were no vitals taken for this visit.  Right elbow incision is well-healed. There is some prominence about the plate posteriorly.  Range of motion about the elbow is from 15 to 130 degrees.  She has full pro supination.  Regard to the left knee she has range of motion from 0 to 125 degrees without pain.  Left elbow range of motion is full and painless    Imaging:    Right elbow 2 views: Status post olecranon fixation without evidence of complication.  There is callus formation.  I personally reviewed and interpreted the radiographs.   Assessment:   61 year old female with a nondisplaced left proximal tibia fracture as well as nondisplaced radial head fracture and a right olecranon fracture status post open reduction internal fixation of the  right olecranon.  Unfortunately she does have loss of terminal extension of 15 degrees and overall feels that her hardware is quite prominent and bothersome.  She is specifically requesting that this be removed.  I did describe that  overall I do believe that this is reasonable as she can feel it and is bothered by this although I did describe that I would like to proceed with a CT scan of the right elbow to confirm that this is healed prior to removal. Plan :    -Plan for CT right elbow and follow-up to discuss results     I personally saw and evaluated the patient, and participated in the management and treatment plan.  Vanetta Mulders, MD Attending Physician, Orthopedic Surgery  This document was dictated using Dragon voice recognition software. A reasonable attempt at proof reading has been made to minimize errors.

## 2022-04-24 ENCOUNTER — Other Ambulatory Visit (HOSPITAL_BASED_OUTPATIENT_CLINIC_OR_DEPARTMENT_OTHER): Payer: Self-pay

## 2022-04-27 ENCOUNTER — Ambulatory Visit (HOSPITAL_BASED_OUTPATIENT_CLINIC_OR_DEPARTMENT_OTHER): Admission: RE | Admit: 2022-04-27 | Payer: BC Managed Care – PPO | Source: Ambulatory Visit

## 2022-04-27 ENCOUNTER — Ambulatory Visit: Payer: BC Managed Care – PPO | Admitting: Internal Medicine

## 2022-05-11 ENCOUNTER — Other Ambulatory Visit: Payer: Self-pay | Admitting: Internal Medicine

## 2022-05-11 ENCOUNTER — Other Ambulatory Visit (HOSPITAL_BASED_OUTPATIENT_CLINIC_OR_DEPARTMENT_OTHER): Payer: Self-pay

## 2022-05-11 ENCOUNTER — Other Ambulatory Visit: Payer: Self-pay | Admitting: Physical Medicine and Rehabilitation

## 2022-05-11 MED ORDER — METHOCARBAMOL 500 MG PO TABS
500.0000 mg | ORAL_TABLET | Freq: Four times a day (QID) | ORAL | 3 refills | Status: DC | PRN
  Filled 2022-05-11: qty 90, 23d supply, fill #0
  Filled 2022-06-02: qty 90, 23d supply, fill #1
  Filled 2022-07-10: qty 90, 23d supply, fill #2
  Filled 2022-08-25: qty 90, 23d supply, fill #3

## 2022-05-11 MED ORDER — EZETIMIBE 10 MG PO TABS
10.0000 mg | ORAL_TABLET | Freq: Every day | ORAL | 1 refills | Status: DC
  Filled 2022-05-11: qty 90, 90d supply, fill #0

## 2022-05-12 ENCOUNTER — Other Ambulatory Visit (HOSPITAL_BASED_OUTPATIENT_CLINIC_OR_DEPARTMENT_OTHER): Payer: Self-pay

## 2022-06-02 ENCOUNTER — Telehealth: Payer: Self-pay | Admitting: Internal Medicine

## 2022-06-02 ENCOUNTER — Other Ambulatory Visit (HOSPITAL_BASED_OUTPATIENT_CLINIC_OR_DEPARTMENT_OTHER): Payer: Self-pay

## 2022-06-02 MED ORDER — BISOPROLOL-HYDROCHLOROTHIAZIDE 2.5-6.25 MG PO TABS
1.0000 | ORAL_TABLET | Freq: Every day | ORAL | 1 refills | Status: DC
  Filled 2022-06-02: qty 90, 90d supply, fill #0

## 2022-06-02 NOTE — Telephone Encounter (Signed)
Medication: oxyCODONE-acetaminophen (PERCOCET) 7.5-325 MG tablet   Bisoprolol-hydroCHLOROthiazide    Preferred Pharmacy (with phone number or street name):  [facility] [address] [place], [place] [address] Phone: [phone]  Fax: [phone]   Agent: Please be advised that RX refills may take up to 3 business days. We ask that you follow-up with your pharmacy.

## 2022-06-02 NOTE — Telephone Encounter (Signed)
Requesting: oxycodone 7.5-325mg   Contract:09/16/20 UDS:10/16/21 Last Visit:04/20/22 Next Visit:07/22/22 Last Refill: 04/20/22 #120 and 0RF   Please Advise

## 2022-06-03 ENCOUNTER — Other Ambulatory Visit (HOSPITAL_BASED_OUTPATIENT_CLINIC_OR_DEPARTMENT_OTHER): Payer: Self-pay

## 2022-06-03 MED ORDER — OXYCODONE-ACETAMINOPHEN 7.5-325 MG PO TABS
1.0000 | ORAL_TABLET | Freq: Four times a day (QID) | ORAL | 0 refills | Status: DC | PRN
  Filled 2022-06-03: qty 120, 30d supply, fill #0

## 2022-06-03 NOTE — Telephone Encounter (Signed)
PDMPok, Rx sent 

## 2022-07-09 ENCOUNTER — Telehealth: Payer: Self-pay | Admitting: Internal Medicine

## 2022-07-09 ENCOUNTER — Encounter (HOSPITAL_BASED_OUTPATIENT_CLINIC_OR_DEPARTMENT_OTHER): Payer: Self-pay | Admitting: Orthopaedic Surgery

## 2022-07-09 ENCOUNTER — Other Ambulatory Visit (HOSPITAL_BASED_OUTPATIENT_CLINIC_OR_DEPARTMENT_OTHER): Payer: Self-pay

## 2022-07-09 NOTE — Telephone Encounter (Signed)
Medication: methocarbamol (ROBAXIN) 500 MG tablet   oxyCODONE-acetaminophen (PERCOCET) 7.5-325 MG tablet    Preferred Pharmacy (with phone number or street name):  [facility] [address], [address] [place] [address] Phone: [phone]  Fax: [phone]    Agent: Please be advised that RX refills may take up to 3 business days. We ask that you follow-up with your pharmacy.

## 2022-07-10 ENCOUNTER — Other Ambulatory Visit: Payer: Self-pay | Admitting: Family

## 2022-07-10 ENCOUNTER — Other Ambulatory Visit (HOSPITAL_BASED_OUTPATIENT_CLINIC_OR_DEPARTMENT_OTHER): Payer: Self-pay

## 2022-07-10 MED ORDER — OXYCODONE-ACETAMINOPHEN 7.5-325 MG PO TABS
1.0000 | ORAL_TABLET | Freq: Four times a day (QID) | ORAL | 0 refills | Status: DC | PRN
  Filled 2022-07-10: qty 120, 30d supply, fill #0

## 2022-07-13 ENCOUNTER — Ambulatory Visit (INDEPENDENT_AMBULATORY_CARE_PROVIDER_SITE_OTHER): Payer: Self-pay

## 2022-07-13 ENCOUNTER — Ambulatory Visit (INDEPENDENT_AMBULATORY_CARE_PROVIDER_SITE_OTHER): Payer: Worker's Compensation | Admitting: Orthopaedic Surgery

## 2022-07-13 ENCOUNTER — Other Ambulatory Visit (HOSPITAL_BASED_OUTPATIENT_CLINIC_OR_DEPARTMENT_OTHER): Payer: Self-pay | Admitting: Orthopaedic Surgery

## 2022-07-13 DIAGNOSIS — M25521 Pain in right elbow: Secondary | ICD-10-CM

## 2022-07-13 DIAGNOSIS — Z9889 Other specified postprocedural states: Secondary | ICD-10-CM

## 2022-07-13 NOTE — Progress Notes (Signed)
Post Operative Evaluation    Procedure/Date of Surgery: Right olecranon open reduction internal fixation 12/09/21  Interval History:   Presents today 7 months status post right olecranon open reduction internal fixation.  Overall she is having some limited extension but is able to flex and extend the elbow in a gliding fashion without any mechanical symptoms.  She is having some symptoms with putting her elbow down directly on the plate.  She states this irritates her when she is resting the elbow.  PMH/PSH/Family History/Social History/Meds/Allergies:    Past Medical History:  Diagnosis Date   Abnormal Pap smear of cervix    h/o HPV   Chronic pain disorder    arthitis and fibromyalgia   Chronic sinusitis, chronic cough (?COPD) 07/16/2009   Depression    Diabetes mellitus    type 2   Dyslipidemia    Elevated LFTs 11/19/2011   Hypertension    Leukocytosis 11/19/2011   Menopausal syndrome    Neck pain 05/20/2011   Sarcoid    post partum in remission   Submandibular gland swelling    Right   Past Surgical History:  Procedure Laterality Date   abdominal plasty remotely     [place] Left 12/09/2021   Procedure: LEFT KNEE  NEEDLE ASPIRATION;  Surgeon: Vanetta Mulders, MD;  Location: [place];  Service: Orthopedics;  Laterality: Left;   NASAL SEPTUM SURGERY  12/26/2015   ORIF ELBOW FRACTURE Right 12/09/2021   Procedure: OPEN REDUCTION INTERNAL FIXATION (ORIF) ELBOW/OLECRANON FRACTURE;  Surgeon: Vanetta Mulders, MD;  Location: [place];  Service: Orthopedics;  Laterality: Right;   pilondial cyst removal     TUBAL LIGATION     TURBINATE REDUCTION Bilateral 12/26/2015   Social History   Socioeconomic History   Marital status: Widowed    Spouse name: Not on file   Number of children: 1   Years of education: Not on file   Highest education level: Not on file  Occupational History   Occupation: RN [employer]  Tobacco Use   Smoking status:  Every Day    Packs/day: 1.00    Years: 28.00    Total pack years: 28.00    Types: Cigarettes   Smokeless tobacco: Never  Substance and Sexual Activity   Alcohol use: Yes    Alcohol/week: 0.0 standard drinks of alcohol    Comment: rare   Drug use: No   Sexual activity: Not on file  Other Topics Concern   Not on file  Social History Narrative   Lost husband, prostate cancer, 08-2012   Lives by herself   Has a daughter , lives in [place] Determinants of Health   Financial Resource Strain: Not on file  Food Insecurity: Not on file  Transportation Needs: Not on file  Physical Activity: Not on file  Stress: Not on file  Social Connections: Not on file   Family History  Problem Relation Age of Onset   Hypertension Other        GM   Colon cancer Other        COLON CA GGM x 2   Diabetes Other        aunt   CAD Maternal Grandfather 50   Stroke Neg Hx    Breast cancer Neg Hx    Allergies  Allergen Reactions  Benadryl [Diphenhydramine] Other (See Comments)    Pt states it makes her blood pressure drop   Erythromycin Other (See Comments)    palpitations; ZPACK is ok   Fluoxetine Hcl Other (See Comments)    hallucinations   Venlafaxine Other (See Comments)    made her feel bad, pt states it causes what feels like electric shocks    Current Outpatient Medications  Medication Sig Dispense Refill   aspirin EC 81 MG tablet Take 81 mg by mouth daily. Swallow whole.     bisoprolol-hydrochlorothiazide (ZIAC) 2.5-6.25 MG tablet Take 1 tablet by mouth daily. 90 tablet 1   blood glucose meter kit and supplies KIT Use up to 4 times daily as directed 1 each 0   Cholecalciferol (VITAMIN D3) 50 MCG (2000 UT) CAPS Take by mouth.     escitalopram (LEXAPRO) 20 MG tablet Take 1 tablet (20 mg total) by mouth daily. 30 tablet 1   ezetimibe (ZETIA) 10 MG tablet Take 1 tablet (10 mg total) by mouth daily. 90 tablet 1   glucose blood test strip Use as directed up to 4 times daily 100 each  0   Lancets MISC Use as directed 4 times daily 100 each 0   metFORMIN (GLUCOPHAGE) 1000 MG tablet Take 1 tablet (1,000 mg total) by mouth daily with breakfast.     methocarbamol (ROBAXIN) 500 MG tablet Take 1 tablet (500 mg total) by mouth every 6 (six) hours as needed for muscle spasms. 90 tablet 3   oxyCODONE-acetaminophen (PERCOCET) 7.5-325 MG tablet Take 1 tablet by mouth every 6 (six) hours as needed for severe pain. Do NOT take with tylenol 120 tablet 0   pioglitazone (ACTOS) 30 MG tablet Take 1 tablet (30 mg total) by mouth daily. 90 tablet 1   rosuvastatin (CRESTOR) 10 MG tablet Take 1 tablet (10 mg total) by mouth every Monday, Wednesday, and Friday. 12 tablet 5   sitaGLIPtin (JANUVIA) 100 MG tablet Take 1 tablet (100 mg total) by mouth daily. 90 tablet 1   traZODone (DESYREL) 100 MG tablet Take 1 tablet (100 mg total) by mouth at bedtime as needed for sleep. 90 tablet 0   No current facility-administered medications for this visit.   No results found.  Review of Systems:   A ROS was performed including pertinent positives and negatives as documented in the HPI.   Musculoskeletal Exam:    There were no vitals taken for this visit.  Right elbow incision is well-healed. There is some prominence about the plate posteriorly.  Range of motion about the elbow is from 15 to 130 degrees.  She has full pro supination.  Elbow moves with a smooth gliding fashion without mechanical symptoms.  She has no subluxation of the ulnar nerve without any distal numbness or paresthesias.    Imaging:    Right elbow 2 views: Status post olecranon fixation without evidence of complication.  There is callus formation.  I personally reviewed and interpreted the radiographs.   Assessment:   61 year old female status post open reduction internal fixation of the right olecranon now 7 months out.Marland Kitchen  Unfortunately she does have loss of terminal extension of 15 degrees and overall feels that her hardware  is quite prominent and bothersome.  She is specifically requesting that this be removed.  Her x-rays today do show significant healing.  To that effect I do believe it would be reasonable to pursue removal of hardware.  I did describe that one of the proximal olecranon screws  does appear slightly prominent although she is able to flex and extend without any mechanical symptoms.  Regardless I do believe it would be ideal to remove this.  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  To this effect I did describe capsular release with elbow arthroscopy given the fact that she is electing to undergo elective removal of hardware.  After further discussion she would like to proceed with this. Plan :    -Plan for right elbow removal of hardware with elbow arthroscopy   After a lengthy discussion of treatment options, including risks, benefits, alternatives, complications of surgical and nonsurgical conservative options, the patient elected surgical repair.   The patient  is aware of the material risks  and complications including, but not limited to injury to adjacent structures, neurovascular injury, infection, numbness, bleeding, implant failure, thermal burns, stiffness, persistent pain, failure to heal, disease transmission from allograft, need for further surgery, dislocation, anesthetic risks, blood clots, risks of death,and others. The probabilities of surgical success and failure discussed with patient given their particular co-morbidities.The time and nature of expected rehabilitation and recovery was discussed.The patient's questions were all answered preoperatively.  No barriers to understanding were noted. I explained the natural history of the disease process and Rx rationale.  I explained to the patient what I considered to be reasonable expectations given their personal situation.  The final  treatment plan was arrived at through a shared patient decision making process model.     I personally saw and evaluated the patient, and participated in the management and treatment plan.  Vanetta Mulders, MD Attending Physician, Orthopedic Surgery  This document was dictated using Dragon voice recognition software. A reasonable attempt at proof reading has been made to minimize errors.

## 2022-07-14 ENCOUNTER — Telehealth: Payer: Self-pay

## 2022-07-14 NOTE — Telephone Encounter (Signed)
Received surgical clearance form from [facility]. Pt is needing R elbow arthroscopic debridement and removal of hardware w/ Dr. Steward Drone. Surgery date TBD. Will place in PCP folder to be completed when he returns to office next week.

## 2022-07-20 NOTE — Telephone Encounter (Signed)
Spoke w/ Pt- she is not having any CP or SHOB.

## 2022-07-20 NOTE — Telephone Encounter (Signed)
Paper work completed.

## 2022-07-20 NOTE — Telephone Encounter (Signed)
Normal renal function.  LDL 83.  Diabetes well-controlled.  No known heart disease although she has some atherosclerosis per CT chest 2020.  Advise patient: As long as she denies chest pain, difficulty breathing and is able to do all her normal activities without chest pain or difficulty breathing I will clear her to proceed with surgery.  I do recommend preop labs and EKG.

## 2022-07-20 NOTE — Telephone Encounter (Signed)
Form faxed back to [facility] at [phone]. Form sent for scanning.

## 2022-07-20 NOTE — Telephone Encounter (Signed)
Received fax confirmation

## 2022-07-22 ENCOUNTER — Encounter: Payer: Self-pay | Admitting: Internal Medicine

## 2022-08-25 ENCOUNTER — Other Ambulatory Visit (HOSPITAL_BASED_OUTPATIENT_CLINIC_OR_DEPARTMENT_OTHER): Payer: Self-pay

## 2022-08-25 ENCOUNTER — Telehealth: Payer: Self-pay | Admitting: Internal Medicine

## 2022-08-25 MED ORDER — OXYCODONE-ACETAMINOPHEN 7.5-325 MG PO TABS
1.0000 | ORAL_TABLET | Freq: Four times a day (QID) | ORAL | 0 refills | Status: DC | PRN
  Filled 2022-08-25: qty 120, 30d supply, fill #0

## 2022-08-25 NOTE — Telephone Encounter (Signed)
PDMP okay prescription sent

## 2022-08-25 NOTE — Telephone Encounter (Signed)
Called patient to advise to schedule appt but she said she does not have insurance or income which is why she hasn't been in the office. Please advise

## 2022-08-25 NOTE — Telephone Encounter (Signed)
Prescription Request  08/25/2022  Is this a "Controlled Substance" medicine? Yes  LOV: 04/20/2022  What is the name of the medication or equipment?  oxyCODONE-acetaminophen (PERCOCET) 7.5-325 MG tablet   Have you contacted your pharmacy to request a refill? No   Which pharmacy would you like this sent to?  [facility] [address], [place] [address] Phone: [phone] Fax: [phone]    Patient notified that their request is being sent to the clinical staff for review and that they should receive a response within 2 business days.   Please advise at Mobile [phone] (mobile)

## 2022-08-25 NOTE — Telephone Encounter (Signed)
Pt overdue for visit. Please schedule at her earliest convenience. Thank you.

## 2022-08-25 NOTE — Telephone Encounter (Signed)
Requesting: oxycodone 7.5-325mg   Contract: 09/16/20 UDS: 10/16/21 Last Visit: 04/20/22 Next Visit: None Last Refill: 07/10/22 #120 and 0RF   See below- Pt currently does not have insurance, and unable to afford a visit  Please Advise

## 2022-09-17 NOTE — Telephone Encounter (Signed)
Opened in error

## 2022-09-28 ENCOUNTER — Other Ambulatory Visit (HOSPITAL_BASED_OUTPATIENT_CLINIC_OR_DEPARTMENT_OTHER): Payer: Self-pay

## 2022-09-28 ENCOUNTER — Other Ambulatory Visit: Payer: Self-pay | Admitting: Internal Medicine

## 2022-09-28 MED ORDER — METHOCARBAMOL 500 MG PO TABS
500.0000 mg | ORAL_TABLET | Freq: Four times a day (QID) | ORAL | 0 refills | Status: DC | PRN
  Filled 2022-09-28: qty 30, 8d supply, fill #0

## 2022-09-30 ENCOUNTER — Other Ambulatory Visit (HOSPITAL_BASED_OUTPATIENT_CLINIC_OR_DEPARTMENT_OTHER): Payer: Self-pay

## 2022-09-30 NOTE — Telephone Encounter (Signed)
OV would be cheaper I think.

## 2022-09-30 NOTE — Telephone Encounter (Signed)
Patient would like to know if she needs to come in for a physical or an OV for med refill. She states she wants to do whatever is cheaper but wants to make sure Dr. Larose Kells is ok with it.

## 2022-10-07 NOTE — Telephone Encounter (Signed)
Appt made

## 2022-10-09 ENCOUNTER — Ambulatory Visit (INDEPENDENT_AMBULATORY_CARE_PROVIDER_SITE_OTHER): Payer: Self-pay | Admitting: Internal Medicine

## 2022-10-09 ENCOUNTER — Encounter: Payer: Self-pay | Admitting: Internal Medicine

## 2022-10-09 ENCOUNTER — Other Ambulatory Visit (HOSPITAL_BASED_OUTPATIENT_CLINIC_OR_DEPARTMENT_OTHER): Payer: Self-pay

## 2022-10-09 VITALS — BP 136/76 | HR 61 | Temp 98.3°F | Resp 12 | Ht 67.0 in | Wt 174.8 lb

## 2022-10-09 DIAGNOSIS — I10 Essential (primary) hypertension: Secondary | ICD-10-CM

## 2022-10-09 DIAGNOSIS — E119 Type 2 diabetes mellitus without complications: Secondary | ICD-10-CM

## 2022-10-09 DIAGNOSIS — E7800 Pure hypercholesterolemia, unspecified: Secondary | ICD-10-CM

## 2022-10-09 DIAGNOSIS — M542 Cervicalgia: Secondary | ICD-10-CM

## 2022-10-09 MED ORDER — METFORMIN HCL 1000 MG PO TABS
1000.0000 mg | ORAL_TABLET | Freq: Every day | ORAL | 1 refills | Status: AC
  Filled 2022-10-09: qty 30, 30d supply, fill #0

## 2022-10-09 MED ORDER — METHOCARBAMOL 500 MG PO TABS
500.0000 mg | ORAL_TABLET | Freq: Four times a day (QID) | ORAL | 3 refills | Status: DC | PRN
  Filled 2022-10-09: qty 30, 8d supply, fill #0

## 2022-10-09 MED ORDER — SITAGLIPTIN PHOSPHATE 100 MG PO TABS
100.0000 mg | ORAL_TABLET | Freq: Every day | ORAL | 1 refills | Status: DC
  Filled 2022-10-09 – 2023-04-28 (×2): qty 30, 30d supply, fill #0
  Filled 2023-04-28: qty 90, 90d supply, fill #0

## 2022-10-09 MED ORDER — ESCITALOPRAM OXALATE 20 MG PO TABS
20.0000 mg | ORAL_TABLET | Freq: Every day | ORAL | 1 refills | Status: DC
  Filled 2022-10-09: qty 30, 30d supply, fill #0
  Filled 2023-03-27: qty 30, 30d supply, fill #1
  Filled 2023-04-28: qty 30, 30d supply, fill #2
  Filled 2023-06-25: qty 30, 30d supply, fill #3
  Filled 2023-07-26: qty 30, 30d supply, fill #4
  Filled 2023-08-26: qty 30, 30d supply, fill #5

## 2022-10-09 MED ORDER — OXYCODONE-ACETAMINOPHEN 7.5-325 MG PO TABS
1.0000 | ORAL_TABLET | Freq: Four times a day (QID) | ORAL | 0 refills | Status: DC | PRN
  Filled 2022-10-09: qty 120, 30d supply, fill #0

## 2022-10-09 MED ORDER — EZETIMIBE 10 MG PO TABS
10.0000 mg | ORAL_TABLET | Freq: Every day | ORAL | 1 refills | Status: DC
  Filled 2022-10-09: qty 30, 30d supply, fill #0
  Filled 2023-07-26: qty 30, 30d supply, fill #1
  Filled 2023-08-26: qty 30, 30d supply, fill #2

## 2022-10-09 MED ORDER — BISOPROLOL-HYDROCHLOROTHIAZIDE 2.5-6.25 MG PO TABS
1.0000 | ORAL_TABLET | Freq: Every day | ORAL | 1 refills | Status: DC
  Filled 2022-10-09: qty 30, 30d supply, fill #0
  Filled 2023-04-01: qty 30, 30d supply, fill #1
  Filled 2023-04-28: qty 30, 30d supply, fill #2
  Filled 2023-06-25: qty 30, 30d supply, fill #3
  Filled 2023-07-26: qty 30, 30d supply, fill #4
  Filled 2023-08-26: qty 30, 30d supply, fill #5

## 2022-10-09 NOTE — Progress Notes (Signed)
Subjective:    Patient ID: Alexandra Parsons, female    DOB: Nov 11, 1960, 62 y.o.   MRN: [id number]  DOS:  10/09/2022 Type of visit - description: ROV  Routine checkup No new concerns  Review of Systems See above   Past Medical History:  Diagnosis Date   Abnormal Pap smear of cervix    h/o HPV   Chronic pain disorder    arthitis and fibromyalgia   Chronic sinusitis, chronic cough (?COPD) 07/16/2009   Depression    Diabetes mellitus    type 2   Dyslipidemia    Elevated LFTs 11/19/2011   Hypertension    Leukocytosis 11/19/2011   Menopausal syndrome    Neck pain 05/20/2011   Sarcoid    post partum in remission   Submandibular gland swelling    Right    Past Surgical History:  Procedure Laterality Date   abdominal plasty remotely     [place] Left 12/09/2021   Procedure: LEFT KNEE  NEEDLE ASPIRATION;  Surgeon: Vanetta Mulders, MD;  Location: [facility];  Service: Orthopedics;  Laterality: Left;   NASAL SEPTUM SURGERY  12/26/2015   ORIF ELBOW FRACTURE Right 12/09/2021   Procedure: OPEN REDUCTION INTERNAL FIXATION (ORIF) ELBOW/OLECRANON FRACTURE;  Surgeon: Vanetta Mulders, MD;  Location: [facility];  Service: Orthopedics;  Laterality: Right;   pilondial cyst removal     TUBAL LIGATION     TURBINATE REDUCTION Bilateral 12/26/2015    Current Outpatient Medications  Medication Instructions   aspirin EC 81 mg, Oral, Daily, Swallow whole.   bisoprolol-hydrochlorothiazide (ZIAC) 2.5-6.25 MG tablet 1 tablet, Oral, Daily   blood glucose meter kit and supplies KIT Use up to 4 times daily as directed   Cholecalciferol (VITAMIN D3) 50 MCG (2000 UT) CAPS Oral   escitalopram (LEXAPRO) 20 mg, Oral, Daily   ezetimibe (ZETIA) 10 mg, Oral, Daily   glucose blood test strip Use as directed up to 4 times daily   Januvia 100 mg, Oral, Daily   Lancets MISC Use as directed 4 times daily   metFORMIN (GLUCOPHAGE) 1,000 mg, Oral, Daily with breakfast   methocarbamol (ROBAXIN) 500 mg, Oral,  Every 6 hours PRN   oxyCODONE-acetaminophen (PERCOCET) 7.5-325 MG tablet Take 1 tablet by mouth every 6 (six) hours as needed for severe pain. Do NOT take with tylenol   pioglitazone (ACTOS) 30 mg, Oral, Daily   rosuvastatin (CRESTOR) 10 mg, Oral, Every M-W-F   traZODone (DESYREL) 100 mg, Oral, At bedtime PRN       Objective:   Physical Exam BP (!) 150/90 (BP Location: Left Arm, Cuff Size: Normal)   Pulse 61   Temp 98.3 F (36.8 C) (Oral)   Resp 12   Ht '5\' 7"'$  (1.702 m)   Wt 174 lb 12.8 oz (79.3 kg)   SpO2 97%   BMI 27.38 kg/m  General:   Well developed, NAD, BMI noted. HEENT:  Normocephalic . Face symmetric, atraumatic Lungs:  CTA B Normal respiratory effort, no intercostal retractions, no accessory muscle use. Heart: RRR,  no murmur.  Lower extremities: no pretibial edema bilaterally  Skin: Not pale. Not jaundice Neurologic:  alert & oriented X3.  Speech normal, gait appropriate for age and unassisted Psych--  Cognition and judgment appear intact.  Cooperative with normal attention span and concentration.  Behavior appropriate. No anxious or depressed appearing.      Assessment     Assessment DM HTN Dyslipidemia: intolerant to zocor ~ 2014 and  Lipitor 04/2018 Vit d  def   CT 09-2018 ---COPD ---Pulmonary nodules, declined further CTs on 05-2019 --- (+)  Coronary calcium   Smoker ~1  ppd Psych: --Depression anxiety --ADHD MSK:  Fibromyalgia  (see OV 01/07/2021, 05/09/2021) Neck pain, chronic, on pain meds prn, UDS 09-2013 risk; intolerant to vicodin, oxycodone ok Fall 12/2021: R elbow, L knee fracture GYN: --BTL, Menopausal , h/o  Abnormal Pap smear, HPV Elevated LFTs: Hepatitis serology (-) 2008 H/o Sarcoid postpartum in remission H/o Chronic cough: Chronic sinusitis? COPD? H/o Leukocytosis  H/o +  PPD   PLAN DM: Ambulatory CBGs in the 120s when checked, on Januvia, pioglitazone and metformin.  Refills sent. HTN: BP upon arrival was slightly elevated,  recheck 136/76.  Currently on Ziac.  No change Dyslipidemia: On Crestor and Zetia. Depression anxiety: Very stressed, continue present care.  See comments under social. Pain management: PDMP okay, on Percocet, trying to take the least amount possible, often x 3 times a day only prescription sent. Social: Had a polytrauma, was under [employer] that ended, currently not working, no income.  She is waiting for a WC hearing.  Considering disability. She needs a BMP, A1c, FLP, UDS and multiple preventive care tests but at this point she cannot afford it.  I understand. RTC 3 months

## 2022-10-09 NOTE — Patient Instructions (Signed)
Check the  blood pressure regularly BP GOAL is between 110/65 and  135/85. If it is consistently higher or lower, let me know     GO TO THE FRONT DESK, PLEASE SCHEDULE YOUR APPOINTMENTS Come back for   checkup in 3 months

## 2022-10-09 NOTE — Assessment & Plan Note (Signed)
DM: Ambulatory CBGs in the 120s when checked, on Januvia, pioglitazone and metformin.  Refills sent. HTN: BP upon arrival was slightly elevated, recheck 136/76.  Currently on Ziac.  No change Dyslipidemia: On Crestor and Zetia. Depression anxiety: Very stressed, continue present care.  See comments under social. Pain management: PDMP okay, on Percocet, trying to take the least amount possible, often x 3 times a day only prescription sent. Social: Had a polytrauma, was under [employer] that ended, currently not working, no income.  She is waiting for a WC hearing.  Considering disability. She needs a BMP, A1c, FLP, UDS and multiple preventive care tests but at this point she cannot afford it.  I understand. RTC 3 months

## 2022-10-12 ENCOUNTER — Other Ambulatory Visit: Payer: Self-pay

## 2022-10-29 ENCOUNTER — Telehealth: Payer: Self-pay | Admitting: Internal Medicine

## 2022-10-29 MED ORDER — METHOCARBAMOL 500 MG PO TABS: 500.0000 mg | ORAL_TABLET | Freq: Four times a day (QID) | ORAL | 3 refills | Status: DC | PRN

## 2022-10-29 NOTE — Telephone Encounter (Signed)
Pt does not need refills due to being here temporarily

## 2022-10-29 NOTE — Telephone Encounter (Signed)
Prescription Request  10/29/2022  Is this a "Controlled Substance" medicine? No  LOV: 10/09/2022  What is the name of the medication or equipment?   Rx #: [id number]  methocarbamol (ROBAXIN) 500 MG tablet [id number]   Have you contacted your pharmacy to request a refill? No   Which pharmacy would you like this sent to?   [facility], [place], [place] [address] P: [phone]  Patient notified that their request is being sent to the clinical staff for review and that they should receive a response within 2 business days.   Please advise at Mobile [phone] (mobile)

## 2022-10-29 NOTE — Addendum Note (Signed)
Addended byDamita Dunnings D on: 10/29/2022 10:23 AM   Modules accepted: Orders

## 2022-10-29 NOTE — Telephone Encounter (Signed)
Rx sent 

## 2022-11-11 ENCOUNTER — Other Ambulatory Visit: Payer: Self-pay | Admitting: Internal Medicine

## 2022-11-11 ENCOUNTER — Other Ambulatory Visit: Payer: Self-pay

## 2022-11-11 ENCOUNTER — Other Ambulatory Visit (HOSPITAL_BASED_OUTPATIENT_CLINIC_OR_DEPARTMENT_OTHER): Payer: Self-pay

## 2022-11-11 ENCOUNTER — Encounter: Payer: Self-pay | Admitting: Internal Medicine

## 2022-11-11 MED ORDER — METHOCARBAMOL 500 MG PO TABS
500.0000 mg | ORAL_TABLET | Freq: Four times a day (QID) | ORAL | 3 refills | Status: DC | PRN
  Filled 2022-11-11 (×2): qty 30, 8d supply, fill #0
  Filled 2022-11-23: qty 30, 8d supply, fill #1
  Filled 2022-12-11: qty 30, 8d supply, fill #2
  Filled 2022-12-27: qty 30, 8d supply, fill #3

## 2022-11-11 NOTE — Telephone Encounter (Signed)
Pt called stating that she needed her Rx rewritten for a 30 day supply for one fill and needed to have it rerouted to the pharmacy downstairs for pickup. Pt asked to give her a call with any questions.

## 2022-11-11 NOTE — Addendum Note (Signed)
Addended byDamita Dunnings D on: 11/11/2022 11:22 AM   Modules accepted: Orders

## 2022-11-11 NOTE — Telephone Encounter (Signed)
Rx resent downstairs.

## 2022-11-12 ENCOUNTER — Other Ambulatory Visit (HOSPITAL_BASED_OUTPATIENT_CLINIC_OR_DEPARTMENT_OTHER): Payer: Self-pay

## 2022-11-17 ENCOUNTER — Encounter: Payer: Self-pay | Admitting: Internal Medicine

## 2022-11-23 ENCOUNTER — Other Ambulatory Visit: Payer: Self-pay | Admitting: Internal Medicine

## 2022-11-23 ENCOUNTER — Other Ambulatory Visit (HOSPITAL_BASED_OUTPATIENT_CLINIC_OR_DEPARTMENT_OTHER): Payer: Self-pay

## 2022-11-23 MED ORDER — OXYCODONE-ACETAMINOPHEN 7.5-325 MG PO TABS
1.0000 | ORAL_TABLET | Freq: Four times a day (QID) | ORAL | 0 refills | Status: DC | PRN
  Filled 2022-11-23: qty 120, 30d supply, fill #0

## 2022-11-23 NOTE — Telephone Encounter (Signed)
Medication: oxyCODONE-acetaminophen (PERCOCET) 7.5-325 MG tablet  Has the patient contacted their pharmacy? No.   Preferred Pharmacy:   [facility] [facility] [address], [address] [place] [address] Phone: [phone]  Fax: [phone]

## 2022-11-23 NOTE — Telephone Encounter (Signed)
Requesting: oxycodone 7.5-325mg   Contract: 09/16/20 UDS: 10/16/21 Last Visit: 10/09/22 Next Visit: 01/15/23 Last Refill: 10/09/22 #120 and 0RF   Please Advise

## 2022-12-11 ENCOUNTER — Other Ambulatory Visit (HOSPITAL_BASED_OUTPATIENT_CLINIC_OR_DEPARTMENT_OTHER): Payer: Self-pay

## 2022-12-28 ENCOUNTER — Other Ambulatory Visit (HOSPITAL_BASED_OUTPATIENT_CLINIC_OR_DEPARTMENT_OTHER): Payer: Self-pay

## 2023-01-11 ENCOUNTER — Other Ambulatory Visit: Payer: Self-pay | Admitting: Internal Medicine

## 2023-01-11 ENCOUNTER — Telehealth: Payer: Self-pay | Admitting: Internal Medicine

## 2023-01-11 ENCOUNTER — Other Ambulatory Visit (HOSPITAL_BASED_OUTPATIENT_CLINIC_OR_DEPARTMENT_OTHER): Payer: Self-pay

## 2023-01-11 NOTE — Telephone Encounter (Signed)
Medication: oxyCODONE-acetaminophen (PERCOCET) 7.5-325 MG tablet  Has the patient contacted their pharmacy? No.   Preferred Pharmacy:   [facility] [address], [address], [place] [address] Phone: [phone]  Fax: [phone] 

## 2023-01-11 NOTE — Telephone Encounter (Signed)
Requesting: oxycodone/apap 7.5-325mg  Contract: No UDS: 10/16/21 Last Visit: 10/09/2022 Next Visit: 01/11/2023 Last Refill: 11/23/22  Please Advise

## 2023-01-12 ENCOUNTER — Other Ambulatory Visit (HOSPITAL_BASED_OUTPATIENT_CLINIC_OR_DEPARTMENT_OTHER): Payer: Self-pay

## 2023-01-12 MED ORDER — METHOCARBAMOL 500 MG PO TABS
500.0000 mg | ORAL_TABLET | Freq: Four times a day (QID) | ORAL | 0 refills | Status: DC | PRN
  Filled 2023-01-12: qty 30, 8d supply, fill #0

## 2023-01-12 MED ORDER — ROSUVASTATIN CALCIUM 10 MG PO TABS
10.0000 mg | ORAL_TABLET | ORAL | 0 refills | Status: DC
  Filled 2023-01-12: qty 12, 28d supply, fill #0

## 2023-01-12 MED ORDER — OXYCODONE-ACETAMINOPHEN 7.5-325 MG PO TABS
1.0000 | ORAL_TABLET | Freq: Four times a day (QID) | ORAL | 0 refills | Status: DC | PRN
  Filled 2023-01-12: qty 120, 30d supply, fill #0

## 2023-01-12 NOTE — Telephone Encounter (Signed)
PDMP okay, Rx sent 

## 2023-01-15 ENCOUNTER — Other Ambulatory Visit: Payer: Self-pay

## 2023-01-15 ENCOUNTER — Encounter: Payer: Self-pay | Admitting: Internal Medicine

## 2023-01-15 ENCOUNTER — Other Ambulatory Visit (HOSPITAL_BASED_OUTPATIENT_CLINIC_OR_DEPARTMENT_OTHER): Payer: Self-pay

## 2023-01-15 ENCOUNTER — Ambulatory Visit (INDEPENDENT_AMBULATORY_CARE_PROVIDER_SITE_OTHER): Payer: Self-pay | Admitting: Internal Medicine

## 2023-01-15 VITALS — BP 132/80 | HR 68 | Temp 98.6°F | Resp 16 | Ht 67.0 in | Wt 172.1 lb

## 2023-01-15 DIAGNOSIS — Z7984 Long term (current) use of oral hypoglycemic drugs: Secondary | ICD-10-CM

## 2023-01-15 DIAGNOSIS — E119 Type 2 diabetes mellitus without complications: Secondary | ICD-10-CM

## 2023-01-15 DIAGNOSIS — E7800 Pure hypercholesterolemia, unspecified: Secondary | ICD-10-CM

## 2023-01-15 DIAGNOSIS — M542 Cervicalgia: Secondary | ICD-10-CM

## 2023-01-15 MED ORDER — OXYCODONE-ACETAMINOPHEN 7.5-325 MG PO TABS
1.0000 | ORAL_TABLET | Freq: Three times a day (TID) | ORAL | 0 refills | Status: DC | PRN
  Filled 2023-01-15: qty 90, 30d supply, fill #0

## 2023-01-15 MED ORDER — METHOCARBAMOL 500 MG PO TABS
500.0000 mg | ORAL_TABLET | Freq: Three times a day (TID) | ORAL | 0 refills | Status: DC | PRN
  Filled 2023-01-15 (×2): qty 90, 30d supply, fill #0

## 2023-01-15 MED ORDER — OXYCODONE-ACETAMINOPHEN 7.5-325 MG PO TABS: 1.0000 | ORAL_TABLET | Freq: Three times a day (TID) | ORAL | 0 refills | Status: DC | PRN

## 2023-01-15 MED ORDER — OXYCODONE-ACETAMINOPHEN 7.5-325 MG PO TABS: 1.0000 | ORAL_TABLET | Freq: Three times a day (TID) | ORAL | Status: DC | PRN

## 2023-01-15 NOTE — Assessment & Plan Note (Signed)
DM: On metformin, pioglitazone, Januvia.  Check A1c. HTN: On Ziac.  Check a CMP. Dyslipidemia: On rosuvastatin, check FLP. Pain management: Since the last visit, she was able to decrease Percocet to TID, occasionally BID  Also requested Robaxin which works well for her.  I asked if by taking Robaxin she would be able to decrease Percocet use and she said yes. Will RF Robaxin for TID use.  PDMP okay, Percocet prescription sent. Social: Today she reports that she is a still working with a Clinical research associate on Therapist, art. reestablished.  (Previously told me Worker's Comp. ended). Labs: Reports she cannot do labs at this office likes to use outside lab due to cost.  Advised that she needs to be sure results reach my desk-- call the office 3 days after blood draw. Preventive care: Information about free mammogram program RTC 3 to 4 months

## 2023-01-15 NOTE — Progress Notes (Signed)
Subjective:    Patient ID: Alexandra Parsons, female    DOB: 1961-04-20, 62 y.o.   MRN: [id number]  DOS:  01/15/2023 Type of visit - description: f/u  Here for follow-up. In general feels well. Did report some upper extremity rash, not sure if related to poison ivy or sun exposure since she went to the beach.  Wt Readings from Last 3 Encounters:  01/15/23 172 lb 2 oz (78.1 kg)  10/09/22 174 lb 12.8 oz (79.3 kg)  04/20/22 168 lb 2 oz (76.3 kg)    Review of Systems See above   Past Medical History:  Diagnosis Date   Abnormal Pap smear of cervix    h/o HPV   Chronic pain disorder    arthitis and fibromyalgia   Chronic sinusitis, chronic cough (?COPD) 07/16/2009   Depression    Diabetes mellitus    type 2   Dyslipidemia    Elevated LFTs 11/19/2011   Hypertension    Leukocytosis 11/19/2011   Menopausal syndrome    Neck pain 05/20/2011   Sarcoid    post partum in remission   Submandibular gland swelling    Right    Past Surgical History:  Procedure Laterality Date   abdominal plasty remotely     FINE NEEDLE ASPIRATION Left 12/09/2021   Procedure: LEFT KNEE  NEEDLE ASPIRATION;  Surgeon: Huel Cote, MD;  Location: [facility] OR;  Service: Orthopedics;  Laterality: Left;   NASAL SEPTUM SURGERY  12/26/2015   ORIF ELBOW FRACTURE Right 12/09/2021   Procedure: OPEN REDUCTION INTERNAL FIXATION (ORIF) ELBOW/OLECRANON FRACTURE;  Surgeon: Huel Cote, MD;  Location: [facility] OR;  Service: Orthopedics;  Laterality: Right;   pilondial cyst removal     TUBAL LIGATION     TURBINATE REDUCTION Bilateral 12/26/2015    Current Outpatient Medications  Medication Instructions   aspirin EC 81 mg, Oral, Daily, Swallow whole.   bisoprolol-hydrochlorothiazide (ZIAC) 2.5-6.25 MG tablet 1 tablet, Oral, Daily   blood glucose meter kit and supplies KIT Use up to 4 times daily as directed   Cholecalciferol (VITAMIN D3) 50 MCG (2000 UT) CAPS Oral   escitalopram (LEXAPRO) 20 mg, Oral, Daily   ezetimibe  (ZETIA) 10 mg, Oral, Daily   glucose blood test strip Use as directed up to 4 times daily   Lancets MISC Use as directed 4 times daily   metFORMIN (GLUCOPHAGE) 1,000 mg, Oral, Daily with breakfast   methocarbamol (ROBAXIN) 500 mg, Oral, Every 8 hours PRN   oxyCODONE-acetaminophen (PERCOCET) 7.5-325 MG tablet 1 tablet, Oral, Every 8 hours PRN   pioglitazone (ACTOS) 30 mg, Oral, Daily   rosuvastatin (CRESTOR) 10 mg, Oral, Every M-W-F   sitaGLIPtin (JANUVIA) 100 mg, Oral, Daily   traZODone (DESYREL) 100 mg, Oral, At bedtime PRN       Objective:   Physical Exam BP 132/80   Pulse 68   Temp 98.6 F (37 C) (Oral)   Resp 16   Ht 5\' 7"  (1.702 m)   Wt 172 lb 2 oz (78.1 kg)   SpO2 96%   BMI 26.96 kg/m  General:   Well developed, NAD, BMI noted. HEENT:  Normocephalic . Face symmetric, atraumatic Lungs:  CTA B Normal respiratory effort, no intercostal retractions, no accessory muscle use. Heart: RRR,  no murmur.  Lower extremities: no pretibial edema bilaterally  Skin: Upper extremities from the elbows distally has few macular, slightly pink and scaly lesions, 3 to 4 mm in size. Neurologic:  alert & oriented X3.  Speech normal,  gait appropriate for age and unassisted Psych--  Cognition and judgment appear intact.  Cooperative with normal attention span and concentration.  Behavior appropriate. No anxious or depressed appearing.      Assessment     Assessment DM HTN Dyslipidemia: intolerant to zocor ~ 2014 and  Lipitor 04/2018 Vit d def   CT 09-2018 ---COPD ---Pulmonary nodules, declined further CTs on 05-2019 --- (+)  Coronary calcium   Smoker ~1  ppd Psych: --Depression anxiety --ADHD MSK:  Fibromyalgia  (see OV 01/07/2021, 05/09/2021) Neck pain, chronic, on pain meds prn, UDS 09-2013 risk; intolerant to vicodin, oxycodone ok Fall 12/2021: R elbow, L knee fracture GYN: --BTL, Menopausal , h/o  Abnormal Pap smear, HPV Elevated LFTs: Hepatitis serology (-) 2008 H/o  Sarcoid postpartum in remission H/o Chronic cough: Chronic sinusitis? COPD? H/o Leukocytosis  H/o +  PPD   PLAN DM: On metformin, pioglitazone, Januvia.  Check A1c. HTN: On Ziac.  Check a CMP. Dyslipidemia: On rosuvastatin, check FLP. Pain management: Since the last visit, she was able to decrease Percocet to TID, occasionally BID  Also requested Robaxin which works well for her.  I asked if by taking Robaxin she would be able to decrease Percocet use and she said yes. Will RF Robaxin for TID use.  PDMP okay, Percocet prescription sent. Social: Today she reports that she is a still working with a Clinical research associate on Therapist, art. reestablished.  (Previously told me Worker's Comp. ended). Labs: Reports she cannot do labs at this office likes to use outside lab due to cost.  Advised that she needs to be sure results reach my desk-- call the office 3 days after blood draw. Preventive care: Information about free mammogram program RTC 3 to 4 months

## 2023-01-15 NOTE — Patient Instructions (Addendum)
Labs I recommend: CMP FLP A1c To be sure the results reached my desk, please call the office 3 days after you get your blood work drawn.  Check the  blood pressure regularly BP GOAL is between 110/65 and  135/85. If it is consistently higher or lower, let me know      GO TO THE FRONT DESK, PLEASE SCHEDULE YOUR APPOINTMENTS Come back for   checkup in 3 to 4 months   Vaccines I recommend: Covid booster RSV vaccine  Per our records you are due for your diabetic eye exam. Please contact your eye doctor to schedule an appointment. Please have them send copies of your office visit notes to [place]. Our fax number is [phone]. If you need a referral to an eye doctor please let us know.

## 2023-01-20 ENCOUNTER — Emergency Department (HOSPITAL_BASED_OUTPATIENT_CLINIC_OR_DEPARTMENT_OTHER): Payer: Self-pay

## 2023-01-20 ENCOUNTER — Encounter (HOSPITAL_BASED_OUTPATIENT_CLINIC_OR_DEPARTMENT_OTHER): Payer: Self-pay

## 2023-01-20 ENCOUNTER — Emergency Department (HOSPITAL_BASED_OUTPATIENT_CLINIC_OR_DEPARTMENT_OTHER)
Admission: EM | Admit: 2023-01-20 | Discharge: 2023-01-20 | Disposition: A | Discharge status: Home / Self Care | Payer: Self-pay | Attending: Emergency Medicine | Admitting: Emergency Medicine

## 2023-01-20 ENCOUNTER — Other Ambulatory Visit: Payer: Self-pay

## 2023-01-20 DIAGNOSIS — Z7984 Long term (current) use of oral hypoglycemic drugs: Secondary | ICD-10-CM | POA: Insufficient documentation

## 2023-01-20 DIAGNOSIS — I10 Essential (primary) hypertension: Secondary | ICD-10-CM | POA: Insufficient documentation

## 2023-01-20 DIAGNOSIS — S61217A Laceration without foreign body of left little finger without damage to nail, initial encounter: Secondary | ICD-10-CM | POA: Insufficient documentation

## 2023-01-20 DIAGNOSIS — W540XXA Bitten by dog, initial encounter: Secondary | ICD-10-CM | POA: Insufficient documentation

## 2023-01-20 DIAGNOSIS — E119 Type 2 diabetes mellitus without complications: Secondary | ICD-10-CM | POA: Insufficient documentation

## 2023-01-20 MED ORDER — AMOXICILLIN-POT CLAVULANATE 875-125 MG PO TABS
1.0000 | ORAL_TABLET | Freq: Two times a day (BID) | ORAL | 0 refills | Status: DC
  Filled 2023-01-20: qty 14, 7d supply, fill #0

## 2023-01-20 MED ORDER — AMOXICILLIN-POT CLAVULANATE 875-125 MG PO TABS
1.0000 | ORAL_TABLET | Freq: Once | ORAL | Status: AC
  Administered 2023-01-20: 1 via ORAL
  Filled 2023-01-20: qty 1

## 2023-01-20 MED ORDER — LIDOCAINE HCL 2 % IJ SOLN
10.0000 mL | Freq: Once | INTRAMUSCULAR | Status: AC
  Administered 2023-01-20: 200 mg
  Filled 2023-01-20: qty 20

## 2023-01-20 NOTE — ED Provider Notes (Addendum)
[place] [facility] AT [facility] Provider Note   CSN: [id number] Arrival date & time: 01/20/23  1616     History  Chief Complaint  Patient presents with   Animal Bite    GEARLDINE BRUMMOND is a 62 y.o. female with history of hypertension, depression, type 2 diabetes, hyperlipidemia, fibromyalgia who presents the emergency department after a dog bite.  Patient states that she was dog sitting, and the dog bit her on her right little finger.  She was only able to partially clean it before she wrapped it and presented to the ER.  Reports dog just received all of its vaccinations, and reports that she is up-to-date on her tetanus.   Animal Bite      Home Medications Prior to Admission medications   Medication Sig Start Date End Date Taking? Authorizing Provider  amoxicillin-clavulanate (AUGMENTIN) 875-125 MG tablet Take 1 tablet by mouth every 12 (twelve) hours. 01/20/23  Yes Taevion Sikora T, PA-C  aspirin EC 81 MG tablet Take 81 mg by mouth daily. Swallow whole.    [provider]  bisoprolol-hydrochlorothiazide (ZIAC) 2.5-6.25 MG tablet Take 1 tablet by mouth daily. 10/09/22   Wanda Plump, MD  blood glucose meter kit and supplies KIT Use up to 4 times daily as directed 12/26/21   Love, Evlyn Kanner, PA-C  Cholecalciferol (VITAMIN D3) 50 MCG (2000 UT) CAPS Take by mouth.    [provider]  escitalopram (LEXAPRO) 20 MG tablet Take 1 tablet (20 mg total) by mouth daily. 10/09/22   Wanda Plump, MD  ezetimibe (ZETIA) 10 MG tablet Take 1 tablet (10 mg total) by mouth daily. 10/09/22   Wanda Plump, MD  glucose blood test strip Use as directed up to 4 times daily 12/26/21   Love, Evlyn Kanner, PA-C  Lancets MISC Use as directed 4 times daily 12/26/21   Love, Evlyn Kanner, PA-C  metFORMIN (GLUCOPHAGE) 1000 MG tablet Take 1 tablet (1,000 mg total) by mouth daily with breakfast. 10/09/22   Wanda Plump, MD  methocarbamol (ROBAXIN) 500 MG tablet Take 1 tablet (500 mg total) by  mouth every 8 (eight) hours as needed for muscle spasms. 01/15/23   Wanda Plump, MD  oxyCODONE-acetaminophen (PERCOCET) 7.5-325 MG tablet Take 1 tablet by mouth every 8 (eight) hours as needed for severe pain. 01/15/23   Wanda Plump, MD  pioglitazone (ACTOS) 30 MG tablet Take 1 tablet (30 mg total) by mouth daily. 04/09/22   Eulis Foster, FNP  rosuvastatin (CRESTOR) 10 MG tablet Take 1 tablet (10 mg total) by mouth every Monday, Wednesday, and Friday. 01/13/23   Wanda Plump, MD  sitaGLIPtin (JANUVIA) 100 MG tablet Take 1 tablet (100 mg total) by mouth daily. 10/09/22   Wanda Plump, MD  traZODone (DESYREL) 100 MG tablet Take 1 tablet (100 mg total) by mouth at bedtime as needed for sleep. 04/09/22   Worthy Rancher B, FNP      Allergies    Benadryl [diphenhydramine], Erythromycin, Fluoxetine hcl, and Venlafaxine    Review of Systems   Review of Systems  Skin:  Positive for wound.  All other systems reviewed and are negative.   Physical Exam Updated Vital Signs BP (!) 150/136 (BP Location: Left Arm)   Pulse 70   Temp 97.7 F (36.5 C)   Resp 18   Ht 5\' 7"  (1.702 m)   Wt 77.1 kg   SpO2 97%   BMI 26.63 kg/m  Physical Exam Vitals and nursing note reviewed.  Constitutional:      Appearance: Normal appearance.  HENT:     Head: Normocephalic and atraumatic.  Eyes:     Conjunctiva/sclera: Conjunctivae normal.  Pulmonary:     Effort: Pulmonary effort is normal. No respiratory distress.  Musculoskeletal:     Comments: Normal ROM of all the digits  Skin:    General: Skin is warm and dry.     Comments: 2 cm V-shaped laceration to the pad of the right 5th finger, bleeding is controlled  Neurological:     Mental Status: She is alert.  Psychiatric:        Mood and Affect: Mood normal.        Behavior: Behavior normal.     ED Results / Procedures / Treatments   Labs (all labs ordered are listed, but only abnormal results are displayed) Labs Reviewed - No data to  display  EKG None  Radiology DG Hand Complete Right  Result Date: 01/20/2023 CLINICAL DATA:  Animal bite fifth finger EXAM: RIGHT HAND - COMPLETE 3 VIEW COMPARISON:  None Available. FINDINGS: Severe osteopenia. No acute fracture or dislocation. Mild degenerative changes seen along several interphalangeal joints. Soft tissue thickening and defect about the distal aspect of fifth digit with overlapping bandage. No radiopaque foreign body. IMPRESSION: Osteopenia with degenerative changes.  No radiopaque foreign body. Soft tissue swelling and defect with a bandage along the distal aspect of fifth digit. Electronically Signed   By: Karen Kays M.D.   On: 01/20/2023 17:25    Procedures .Marland KitchenLaceration Repair  Date/Time: 01/20/2023 6:29 PM  Performed by: Su Monks, PA-C Authorized by: Su Monks, PA-C   Consent:    Consent obtained:  Verbal   Consent given by:  Patient   Risks discussed:  Infection, pain, poor cosmetic result and poor wound healing Universal protocol:    Patient identity confirmed:  Provided demographic data Anesthesia:    Anesthesia method:  Nerve block   Block location:  Right 2nd digit   Block needle gauge:  25 G   Block anesthetic:  Lidocaine 1% w/o epi   Block technique:  Transthecal   Block injection procedure:  Anatomic landmarks palpated   Block outcome:  Anesthesia achieved Laceration details:    Location:  Finger   Finger location:  R small finger   Length (cm):  2   Depth (mm):  1 Pre-procedure details:    Preparation:  Imaging obtained to evaluate for foreign bodies and patient was prepped and draped in usual sterile fashion Exploration:    Hemostasis achieved with:  Direct pressure   Imaging obtained: x-ray     Imaging outcome: foreign body not noted     Wound exploration: wound explored through full range of motion   Treatment:    Area cleansed with:  Shur-Clens   Amount of cleaning:  Standard Skin repair:    Repair method:   Sutures   Suture size:  6-0   Suture material:  Chromic gut   Suture technique:  Simple interrupted   Number of sutures:  4 Approximation:    Approximation:  Loose Repair type:    Repair type:  Simple Post-procedure details:    Dressing:  Bulky dressing   Procedure completion:  Tolerated well, no immediate complications     Medications Ordered in ED Medications  lidocaine (XYLOCAINE) 2 % (with pres) injection 200 mg (200 mg Infiltration Given by Other 01/20/23 1758)  amoxicillin-clavulanate (AUGMENTIN) 875-125  MG per tablet 1 tablet (1 tablet Oral Given 01/20/23 1758)    ED Course/ Medical Decision Making/ A&P                             Medical Decision Making Amount and/or Complexity of Data Reviewed Radiology: ordered.  Risk Prescription drug management.   Patient is a 62 y.o. female who presents to the emergency department with concern for dog bite to the right little finger. Wound occurred <1 hr prior to ER arrival.   Physical exam: 2 cm V-shaped laceration to the pad of the right 5th finger, normal ROM of the digits  Imaging: XR of the right hand with soft tissue swelling, no foreign bodies. Chronic degenerative changes but no acute bony abnormality.  Procedure: Wound explored and base of wound visualized in a bloodless field without evidence of foreign body. Laceration was anesthestized with lidocaine w/o epi and closed with sutures. Patient tolerated procedure well with no immediate complications. Tdap was up to date (2019).   Medications: Given initial dose of augmentin, will send the rest of the course as prescription  Disposition: Patient discharged with antibiotics.  Discussed suture home care with patient and answered questions. Patient to follow-up for wound check; they are to return to the ED sooner for signs of infection. Pt is hemodynamically stable with no complaints prior to discharge.  Final Clinical Impression(s) / ED Diagnoses Final diagnoses:   Dog bite, initial encounter    Rx / DC Orders ED Discharge Orders          Ordered    amoxicillin-clavulanate (AUGMENTIN) 875-125 MG tablet  Every 12 hours        01/20/23 1827           Portions of this report may have been transcribed using voice recognition software. Every effort was made to ensure accuracy; however, inadvertent computerized transcription errors may be present.    Su Monks, PA-C 01/20/23 1832    Elayne Snare K, DO 01/20/23 2030

## 2023-01-20 NOTE — ED Triage Notes (Signed)
Pt sustained dog bite to right pinky.  Pt is dog sitting, 2cm linear wound to right 5th finger, bleeding controlled with pressure.  States tetanus shot on record, unsure of exact time.  Pt states animal fully vaccinated to her knowledge

## 2023-01-20 NOTE — Discharge Instructions (Addendum)
You were seen in the emergency department for a dog bite to the hand.   We have closed your laceration(s) with sutures. These should dissolve in about a week. This can be done at any doctor's office, urgent care, or emergency department.   If any of the sutures come out before it is time for removal, that is okay. Make sure to keep the area as clean and dry as possible. You can let warm soapy warm run over the area, but do NOT scrub it.   Watch out for signs of infection, like we discussed, including: increased redness, tenderness, or drainage of pus from the area.  You can take over the counter pain medicine like ibuprofen or tylenol as needed.

## 2023-01-21 ENCOUNTER — Other Ambulatory Visit (HOSPITAL_BASED_OUTPATIENT_CLINIC_OR_DEPARTMENT_OTHER): Payer: Self-pay

## 2023-01-29 LAB — BASIC METABOLIC PANEL
BUN: 15 (ref 4–21)
CO2: 25 — AB (ref 13–22)
Chloride: 106 (ref 99–108)
Creatinine: 0.7 (ref 0.5–1.1)
Glucose: 112
Potassium: 4.1 mEq/L (ref 3.5–5.1)
Sodium: 140 (ref 137–147)

## 2023-01-29 LAB — HEMOGLOBIN A1C: Hemoglobin A1C: 6.1

## 2023-01-29 LAB — HEPATIC FUNCTION PANEL
ALT: 10 U/L (ref 7–35)
AST: 16 (ref 13–35)
Alkaline Phosphatase: 61 (ref 25–125)
Bilirubin, Total: 0.5

## 2023-01-29 LAB — LIPID PANEL
Cholesterol: 91 (ref 0–200)
HDL: 22 — AB (ref 35–70)
LDL Cholesterol: 48
Triglycerides: 126 (ref 40–160)

## 2023-01-29 LAB — COMPREHENSIVE METABOLIC PANEL
Albumin: 4 (ref 3.5–5.0)
Calcium: 9.6 (ref 8.7–10.7)
Globulin: 3.5
eGFR: 98

## 2023-01-29 LAB — PROTEIN / CREATININE RATIO, URINE: Albumin, U: 3.3

## 2023-02-04 ENCOUNTER — Encounter: Payer: Self-pay | Admitting: Internal Medicine

## 2023-02-09 ENCOUNTER — Telehealth: Payer: Self-pay

## 2023-02-09 NOTE — Telephone Encounter (Signed)
Labs received from [facility] dated 01/29/23. Abstracted and placed in PCP red folder for review.

## 2023-02-10 NOTE — Telephone Encounter (Signed)
Mychart message sent to Pt.

## 2023-02-10 NOTE — Telephone Encounter (Signed)
Let patient know: DM and cholesterol are well-controlled. Continue same meds.

## 2023-02-16 ENCOUNTER — Other Ambulatory Visit (HOSPITAL_BASED_OUTPATIENT_CLINIC_OR_DEPARTMENT_OTHER): Payer: Self-pay

## 2023-02-16 ENCOUNTER — Telehealth: Payer: Self-pay | Admitting: Internal Medicine

## 2023-02-16 MED ORDER — ROSUVASTATIN CALCIUM 10 MG PO TABS
10.0000 mg | ORAL_TABLET | ORAL | 1 refills | Status: DC
  Filled 2023-02-16: qty 12, 28d supply, fill #0
  Filled 2023-04-28: qty 12, 28d supply, fill #1

## 2023-02-16 MED ORDER — METHOCARBAMOL 500 MG PO TABS
500.0000 mg | ORAL_TABLET | Freq: Three times a day (TID) | ORAL | 3 refills | Status: DC | PRN
  Filled 2023-02-16: qty 90, 30d supply, fill #0
  Filled 2023-03-22: qty 90, 30d supply, fill #1
  Filled 2023-04-21: qty 90, 30d supply, fill #2
  Filled 2023-05-24 (×2): qty 90, 30d supply, fill #3

## 2023-02-16 NOTE — Telephone Encounter (Signed)
Requesting: oxycodone 7.5-325mg   Contract:10/16/22 UDS: 10/16/21 Last Visit: 01/15/23 Next Visit: 05/21/23 Last Refill: 01/15/23 #90 and 0RF  Please Advise

## 2023-02-16 NOTE — Telephone Encounter (Signed)
Patient called and is needing a med refill on these medications methocarbamol (ROBAXIN) 500 MG tablet , oxyCODONE-acetaminophen (PERCOCET) 7.5-325 MG tablet and rosuvastatin (CRESTOR) 10 MG tablet  Please send to pharmacy downstairs, [facility].

## 2023-02-16 NOTE — Addendum Note (Signed)
Addended byConrad Fresno D on: 02/16/2023 03:25 PM   Modules accepted: Orders

## 2023-02-17 ENCOUNTER — Other Ambulatory Visit (HOSPITAL_BASED_OUTPATIENT_CLINIC_OR_DEPARTMENT_OTHER): Payer: Self-pay

## 2023-02-17 ENCOUNTER — Other Ambulatory Visit: Payer: Self-pay | Admitting: Internal Medicine

## 2023-02-17 MED ORDER — OXYCODONE-ACETAMINOPHEN 7.5-325 MG PO TABS
1.0000 | ORAL_TABLET | Freq: Three times a day (TID) | ORAL | 0 refills | Status: DC | PRN
  Filled 2023-02-17: qty 90, 30d supply, fill #0

## 2023-02-17 NOTE — Telephone Encounter (Signed)
Duplicate requet. Sent to PCP yesterday

## 2023-02-17 NOTE — Addendum Note (Signed)
Addended by: Willow Ora E on: 02/17/2023 03:56 PM   Modules accepted: Orders

## 2023-02-17 NOTE — Telephone Encounter (Signed)
PDMP okay, Rx sent 

## 2023-03-19 ENCOUNTER — Encounter: Payer: Self-pay | Admitting: Internal Medicine

## 2023-03-19 ENCOUNTER — Other Ambulatory Visit (HOSPITAL_BASED_OUTPATIENT_CLINIC_OR_DEPARTMENT_OTHER): Payer: Self-pay

## 2023-03-19 MED ORDER — OXYCODONE-ACETAMINOPHEN 5-325 MG PO TABS
1.0000 | ORAL_TABLET | Freq: Four times a day (QID) | ORAL | 0 refills | Status: DC | PRN
  Filled 2023-03-19: qty 100, 25d supply, fill #0

## 2023-03-19 NOTE — Telephone Encounter (Signed)
PDMP reviewed, prescription sent 

## 2023-03-22 ENCOUNTER — Other Ambulatory Visit: Payer: Self-pay

## 2023-03-23 ENCOUNTER — Other Ambulatory Visit (HOSPITAL_BASED_OUTPATIENT_CLINIC_OR_DEPARTMENT_OTHER): Payer: Self-pay

## 2023-03-29 ENCOUNTER — Other Ambulatory Visit (HOSPITAL_BASED_OUTPATIENT_CLINIC_OR_DEPARTMENT_OTHER): Payer: Self-pay

## 2023-04-01 ENCOUNTER — Other Ambulatory Visit (HOSPITAL_BASED_OUTPATIENT_CLINIC_OR_DEPARTMENT_OTHER): Payer: Self-pay

## 2023-04-22 ENCOUNTER — Other Ambulatory Visit (HOSPITAL_BASED_OUTPATIENT_CLINIC_OR_DEPARTMENT_OTHER): Payer: Self-pay

## 2023-04-26 ENCOUNTER — Telehealth: Payer: Self-pay | Admitting: Internal Medicine

## 2023-04-26 ENCOUNTER — Other Ambulatory Visit (HOSPITAL_BASED_OUTPATIENT_CLINIC_OR_DEPARTMENT_OTHER): Payer: Self-pay

## 2023-04-26 MED ORDER — OXYCODONE-ACETAMINOPHEN 5-325 MG PO TABS
1.0000 | ORAL_TABLET | Freq: Four times a day (QID) | ORAL | 0 refills | Status: DC | PRN
  Filled 2023-04-26: qty 100, 25d supply, fill #0

## 2023-04-26 NOTE — Telephone Encounter (Signed)
Patient requesting refill on oxycodone-acetaminophen. Pt is out of meds. Please send to [facility] pharmacy.

## 2023-04-26 NOTE — Addendum Note (Signed)
Addended byConrad Sunbury D on: 04/26/2023 12:55 PM   Modules accepted: Orders

## 2023-04-26 NOTE — Addendum Note (Signed)
Addended by: Willow Ora E on: 04/26/2023 03:21 PM   Modules accepted: Orders

## 2023-04-26 NOTE — Telephone Encounter (Signed)
PDMP okay, Rx sent. 100 tablets sent, they should last 1 month (at least)

## 2023-04-26 NOTE — Telephone Encounter (Signed)
Requesting:oxycodone 5-325mg  Contract:10/16/22 UDS: 10/16/21 Last Visit: 01/15/23 Next Visit: 05/24/23 Last Refill: 03/23/23 #100 and 0RF   Please Advise

## 2023-04-28 ENCOUNTER — Other Ambulatory Visit: Payer: Self-pay | Admitting: Family

## 2023-04-28 ENCOUNTER — Other Ambulatory Visit (HOSPITAL_BASED_OUTPATIENT_CLINIC_OR_DEPARTMENT_OTHER): Payer: Self-pay

## 2023-05-14 ENCOUNTER — Other Ambulatory Visit: Payer: Self-pay | Admitting: Internal Medicine

## 2023-05-14 DIAGNOSIS — Z1211 Encounter for screening for malignant neoplasm of colon: Secondary | ICD-10-CM

## 2023-05-14 DIAGNOSIS — Z1212 Encounter for screening for malignant neoplasm of rectum: Secondary | ICD-10-CM

## 2023-05-21 ENCOUNTER — Ambulatory Visit: Payer: Self-pay | Admitting: Internal Medicine

## 2023-05-24 ENCOUNTER — Other Ambulatory Visit (HOSPITAL_BASED_OUTPATIENT_CLINIC_OR_DEPARTMENT_OTHER): Payer: Self-pay

## 2023-05-24 ENCOUNTER — Ambulatory Visit (INDEPENDENT_AMBULATORY_CARE_PROVIDER_SITE_OTHER): Payer: Self-pay | Admitting: Internal Medicine

## 2023-05-24 ENCOUNTER — Encounter: Payer: Self-pay | Admitting: Internal Medicine

## 2023-05-24 VITALS — BP 128/86 | HR 70 | Temp 98.5°F | Resp 16 | Ht 67.0 in | Wt 164.1 lb

## 2023-05-24 DIAGNOSIS — E119 Type 2 diabetes mellitus without complications: Secondary | ICD-10-CM

## 2023-05-24 DIAGNOSIS — Z23 Encounter for immunization: Secondary | ICD-10-CM

## 2023-05-24 DIAGNOSIS — Z79899 Other long term (current) drug therapy: Secondary | ICD-10-CM

## 2023-05-24 DIAGNOSIS — I10 Essential (primary) hypertension: Secondary | ICD-10-CM

## 2023-05-24 DIAGNOSIS — Z7984 Long term (current) use of oral hypoglycemic drugs: Secondary | ICD-10-CM

## 2023-05-24 DIAGNOSIS — Z1211 Encounter for screening for malignant neoplasm of colon: Secondary | ICD-10-CM

## 2023-05-24 DIAGNOSIS — E7800 Pure hypercholesterolemia, unspecified: Secondary | ICD-10-CM

## 2023-05-24 DIAGNOSIS — M542 Cervicalgia: Secondary | ICD-10-CM

## 2023-05-24 DIAGNOSIS — R634 Abnormal weight loss: Secondary | ICD-10-CM

## 2023-05-24 MED ORDER — OXYCODONE-ACETAMINOPHEN 5-325 MG PO TABS
1.0000 | ORAL_TABLET | Freq: Four times a day (QID) | ORAL | 0 refills | Status: DC | PRN
  Filled 2023-05-24: qty 120, 30d supply, fill #0

## 2023-05-24 NOTE — Progress Notes (Unsigned)
Subjective:    Patient ID: Alexandra Parsons, female    DOB: Mar 19, 1961, 62 y.o.   MRN: [id number]  DOS:  05/24/2023 Type of visit - description: Follow-up  Chronic medical problems reviewed. Reports that pain is not well-controlled with only 3 Percocets daily. Her pain level has increased and is hurting "all over". Denies any headache, fever or chills.  I did notice weight loss but states she is trying to do better and remains busy taking care of her 4 year old mother. Denies nausea vomiting.  No diarrhea or blood in the stools. She continues smoking and admits to some cough but no hemoptysis.   Wt Readings from Last 3 Encounters:  05/24/23 164 lb 2 oz (74.4 kg)  01/20/23 170 lb (77.1 kg)  01/15/23 172 lb 2 oz (78.1 kg)     Review of Systems See above   Past Medical History:  Diagnosis Date   Abnormal Pap smear of cervix    h/o HPV   Chronic pain disorder    arthitis and fibromyalgia   Chronic sinusitis, chronic cough (?COPD) 07/16/2009   Depression    Diabetes mellitus    type 2   Dyslipidemia    Elevated LFTs 11/19/2011   Hypertension    Leukocytosis 11/19/2011   Menopausal syndrome    Neck pain 05/20/2011   Sarcoid    post partum in remission   Submandibular gland swelling    Right    Past Surgical History:  Procedure Laterality Date   abdominal plasty remotely     FINE NEEDLE ASPIRATION Left 12/09/2021   Procedure: LEFT KNEE  NEEDLE ASPIRATION;  Surgeon: Huel Cote, MD;  Location: [facility] OR;  Service: Orthopedics;  Laterality: Left;   NASAL SEPTUM SURGERY  12/26/2015   ORIF ELBOW FRACTURE Right 12/09/2021   Procedure: OPEN REDUCTION INTERNAL FIXATION (ORIF) ELBOW/OLECRANON FRACTURE;  Surgeon: Huel Cote, MD;  Location: [facility] OR;  Service: Orthopedics;  Laterality: Right;   pilondial cyst removal     TUBAL LIGATION     TURBINATE REDUCTION Bilateral 12/26/2015    Current Outpatient Medications  Medication Instructions   amoxicillin-clavulanate  (AUGMENTIN) 875-125 MG tablet 1 tablet, Oral, Every 12 hours   aspirin EC 81 mg, Oral, Daily, Swallow whole.   bisoprolol-hydrochlorothiazide (ZIAC) 2.5-6.25 MG tablet 1 tablet, Oral, Daily   blood glucose meter kit and supplies KIT Use up to 4 times daily as directed   Cholecalciferol (VITAMIN D3) 50 MCG (2000 UT) CAPS Oral   escitalopram (LEXAPRO) 20 mg, Oral, Daily   ezetimibe (ZETIA) 10 mg, Oral, Daily   glucose blood test strip Use as directed up to 4 times daily   Lancets MISC Use as directed 4 times daily   metFORMIN (GLUCOPHAGE) 1,000 mg, Oral, Daily with breakfast   methocarbamol (ROBAXIN) 500 mg, Oral, Every 8 hours PRN   oxyCODONE-acetaminophen (PERCOCET/ROXICET) 5-325 MG tablet 1 tablet, Oral, Every 6 hours PRN   pioglitazone (ACTOS) 30 mg, Oral, Daily   rosuvastatin (CRESTOR) 10 mg, Oral, Every M-W-F   sitaGLIPtin (JANUVIA) 100 mg, Oral, Daily   traZODone (DESYREL) 100 mg, Oral, At bedtime PRN       Objective:   Physical Exam BP 128/86   Pulse 70   Temp 98.5 F (36.9 C) (Oral)   Resp 16   Ht 5\' 7"  (1.702 m)   Wt 164 lb 2 oz (74.4 kg)   SpO2 96%   BMI 25.71 kg/m  General:   Well developed, NAD, BMI noted. HEENT:  Normocephalic .  Face symmetric, atraumatic Lungs:  CTA B Normal respiratory effort, no intercostal retractions, no accessory muscle use. Heart: RRR,  no murmur.  Lower extremities: no pretibial edema bilaterally  Skin: Not pale. Not jaundice Neurologic:  alert & oriented X3.  Speech normal, gait appropriate for age and unassisted Psych--  Cognition and judgment appear intact.  Cooperative with normal attention span and concentration.  Behavior appropriate. No anxious or depressed appearing.      Assessment    Assessment DM HTN Dyslipidemia: intolerant to zocor ~ 2014 and  Lipitor 04/2018 Vit d def   CT 09-2018 ---COPD ---Pulmonary nodules, declined further CTs on 05-2019 --- (+)  Coronary calcium   Smoker ~1  ppd Psych: --Depression  anxiety --ADHD MSK:  Fibromyalgia  (see OV 01/07/2021, 05/09/2021) Neck pain, chronic, on pain meds prn, UDS 09-2013 risk; intolerant to vicodin, oxycodone ok Fall 12/2021: R elbow, L knee fracture GYN: --BTL, Menopausal , h/o  Abnormal Pap smear, HPV Elevated LFTs: Hepatitis serology (-) 2008 H/o Sarcoid postpartum in remission H/o Chronic cough: Chronic sinusitis? COPD? H/o Leukocytosis  H/o +  PPD   PLAN  DM: On metformin, Actos, Januvia.  Checking A1c. HTN, BP is very good, continue Ziac, check a BMP. High cholesterol: Well-controlled per last FLP. Anxiety, depression.  Controlled on Lexapro, trazodone. Weight loss: Weight loss noted, she is trying to do better, nevertheless we will check a CBC and a TSH. Pain management: Since last visit, pain has increased, currently on Percocet 5/325 mg QID at most and I am prescribing a total of 100 tablets a month  Pt reports that is not enough and she really needs to take 4 tablets every day.   Plan: I agreed to prescribe 120 tablets monthly but she really needs to see pain management.  Will do that as soon as insurance is available. Preventive care recommended: Was given information about free MMGs but she declines to proceed. Flu shot today, recommend to have a COVID booster at the pharmacy. CCS: Unable to afford Cologuard or a colonoscopy, agreed on iFOB.  Knows she will need further evaluation if it come back positive. RTC 4 months.

## 2023-05-24 NOTE — Patient Instructions (Addendum)
Vaccines I recommend: Covid booster RSV vaccine  Pain medication sent  Return the stool test as soon as possible  Check the  blood pressure regularly Blood pressure goal:  between 110/65 and  135/85. If it is consistently higher or lower, let me know     GO TO THE LAB : Get the blood work     Next visit with me in 4 months.     Please schedule it at the front desk

## 2023-05-25 LAB — BASIC METABOLIC PANEL
BUN: 11 mg/dL (ref 6–23)
CO2: 25 meq/L (ref 19–32)
Calcium: 9.8 mg/dL (ref 8.4–10.5)
Chloride: 107 meq/L (ref 96–112)
Creatinine, Ser: 0.82 mg/dL (ref 0.40–1.20)
GFR: 76.3 mL/min (ref >60.00)
Glucose, Bld: 121 mg/dL — ABNORMAL HIGH (ref 70–99)
Potassium: 4.7 meq/L (ref 3.5–5.1)
Sodium: 138 meq/L (ref 135–145)

## 2023-05-25 LAB — CBC WITH DIFFERENTIAL/PLATELET
Basophils Absolute: 0.1 10*3/uL (ref 0.0–0.1)
Basophils Relative: 0.6 % (ref 0.0–3.0)
Eosinophils Absolute: 0.1 10*3/uL (ref 0.0–0.7)
Eosinophils Relative: 0.6 % (ref 0.0–5.0)
HCT: 41.4 % (ref 36.0–46.0)
Hemoglobin: 13.7 g/dL (ref 12.0–15.0)
Lymphocytes Relative: 18 % (ref 12.0–46.0)
Lymphs Abs: 2.1 10*3/uL (ref 0.7–4.0)
MCHC: 33.1 g/dL (ref 30.0–36.0)
MCV: 97.9 fL (ref 78.0–100.0)
Monocytes Absolute: 0.5 10*3/uL (ref 0.1–1.0)
Monocytes Relative: 4.7 % (ref 3.0–12.0)
Neutro Abs: 8.8 10*3/uL — ABNORMAL HIGH (ref 1.4–7.7)
Neutrophils Relative %: 76.1 % (ref 43.0–77.0)
Platelets: 230 10*3/uL (ref 150.0–400.0)
RBC: 4.23 Mil/uL (ref 3.87–5.11)
RDW: 13.5 % (ref 11.5–15.5)
WBC: 11.5 10*3/uL — ABNORMAL HIGH (ref 4.0–10.5)

## 2023-05-25 LAB — HEMOGLOBIN A1C: Hgb A1c MFr Bld: 5.9 % (ref 4.6–6.5)

## 2023-05-25 LAB — TSH: TSH: 1.53 u[IU]/mL (ref 0.35–5.50)

## 2023-05-25 NOTE — Assessment & Plan Note (Signed)
DM: On metformin, Actos, Januvia.  Checking A1c. HTN, BP is very good, continue Ziac, check a BMP. High cholesterol: Well-controlled per last FLP. Anxiety, depression.  Controlled on Lexapro, trazodone. Weight loss: Weight loss noted, she is trying to do better, nevertheless we will check a CBC and a TSH. Pain management: Since last visit, pain has increased, currently on Percocet 5/325 mg QID at most , I Rx 100 tablets a month  Pt reports that is not enough and she really needs to take 4 tablets every day.   Plan: I agreed to prescribe 120 tablets monthly but she really needs to see pain management.  Will do that as soon as insurance is available. Preventive care recommended: Was given information about free MMGs but she declines to proceed. Flu shot today, recommend to have a COVID booster at the pharmacy. CCS: Unable to afford Cologuard or a colonoscopy, agreed on iFOB.  Knows she will need further evaluation if it come back positive. RTC 4 months.

## 2023-05-26 LAB — DRUG MONITORING PANEL 375977 , URINE
Alcohol Metabolites: NEGATIVE ng/mL (ref <500)
Amphetamines: NEGATIVE ng/mL (ref <500)
Barbiturates: NEGATIVE ng/mL (ref <300)
Benzodiazepines: NEGATIVE ng/mL (ref <100)
Cocaine Metabolite: NEGATIVE ng/mL (ref <150)
Codeine: NEGATIVE ng/mL (ref <50)
Desmethyltramadol: NEGATIVE ng/mL (ref <100)
Hydrocodone: NEGATIVE ng/mL (ref <50)
Hydromorphone: NEGATIVE ng/mL (ref <50)
Marijuana Metabolite: NEGATIVE ng/mL (ref <20)
Morphine: NEGATIVE ng/mL (ref <50)
Norhydrocodone: NEGATIVE ng/mL (ref <50)
Noroxycodone: 8671 ng/mL — ABNORMAL HIGH (ref <50)
Opiates: NEGATIVE ng/mL (ref <100)
Oxycodone: 3048 ng/mL — ABNORMAL HIGH (ref <50)
Oxycodone: POSITIVE ng/mL — AB (ref <100)
Oxymorphone: 1116 ng/mL — ABNORMAL HIGH (ref <50)
Tramadol: NEGATIVE ng/mL (ref <100)

## 2023-05-26 LAB — DM TEMPLATE

## 2023-06-22 ENCOUNTER — Other Ambulatory Visit: Payer: Self-pay | Admitting: Internal Medicine

## 2023-06-23 ENCOUNTER — Other Ambulatory Visit (HOSPITAL_BASED_OUTPATIENT_CLINIC_OR_DEPARTMENT_OTHER): Payer: Self-pay

## 2023-06-23 MED ORDER — METHOCARBAMOL 500 MG PO TABS
500.0000 mg | ORAL_TABLET | Freq: Three times a day (TID) | ORAL | 3 refills | Status: DC | PRN
  Filled 2023-06-23: qty 90, 30d supply, fill #0
  Filled 2023-07-26: qty 90, 30d supply, fill #1
  Filled 2023-08-26: qty 90, 30d supply, fill #2
  Filled 2023-09-22: qty 90, 30d supply, fill #3

## 2023-06-25 ENCOUNTER — Encounter: Payer: Self-pay | Admitting: Internal Medicine

## 2023-06-25 ENCOUNTER — Ambulatory Visit (INDEPENDENT_AMBULATORY_CARE_PROVIDER_SITE_OTHER): Payer: Self-pay | Admitting: Family

## 2023-06-25 ENCOUNTER — Other Ambulatory Visit (HOSPITAL_BASED_OUTPATIENT_CLINIC_OR_DEPARTMENT_OTHER): Payer: Self-pay

## 2023-06-25 ENCOUNTER — Telehealth: Payer: Self-pay | Admitting: Internal Medicine

## 2023-06-25 VITALS — BP 142/88 | HR 66 | Resp 18 | Ht 67.0 in | Wt 167.4 lb

## 2023-06-25 DIAGNOSIS — J209 Acute bronchitis, unspecified: Secondary | ICD-10-CM

## 2023-06-25 MED ORDER — OXYCODONE-ACETAMINOPHEN 5-325 MG PO TABS
1.0000 | ORAL_TABLET | Freq: Four times a day (QID) | ORAL | 0 refills | Status: DC | PRN
  Filled 2023-06-25: qty 120, 30d supply, fill #0

## 2023-06-25 MED ORDER — AZITHROMYCIN 250 MG PO TABS
ORAL_TABLET | ORAL | 0 refills | Status: DC
  Filled 2023-06-25: qty 6, 5d supply, fill #0

## 2023-06-25 MED ORDER — PREDNISONE 20 MG PO TABS
20.0000 mg | ORAL_TABLET | Freq: Every day | ORAL | 0 refills | Status: DC
  Filled 2023-06-25: qty 5, 5d supply, fill #0

## 2023-06-25 NOTE — Telephone Encounter (Signed)
Requesting: oxycodone 5-325mg  Contract: 10/16/22 UDS: 05/24/23 Last Visit: 05/24/23 Next Visit: 09/21/23 Last Refill:  05/24/23 #120 and 0RF   Please Advise

## 2023-06-25 NOTE — Telephone Encounter (Signed)
PDMP okay, Rx sent 

## 2023-06-25 NOTE — Progress Notes (Signed)
Alexandra Parsons is a 62 y.o. female with the following history as recorded in EpicCare:  Patient Active Problem List   Diagnosis Date Noted   Critical polytrauma 12/12/2021   Recurrent left knee instability    Fracture of ulnar shaft, closed 12/08/2021   Chronic pain disorder 12/08/2021   Fracture of radial head, left, closed 12/08/2021   Knee effusion, left 12/08/2021   Fall at home, initial encounter 12/08/2021   Frequent falls 12/08/2021   DNR (do not resuscitate) 12/08/2021   Vitamin D deficiency 10/16/2021   Fibromyalgia 05/09/2021   Insomnia 10/28/2020   Tobacco abuse 06/08/2019   COPD- per CT 09-2018 11/26/2018   PCP NOTES >>>>>>>>>>>>>>>>>>>>> 12/30/2015   Leukocytosis 11/19/2011   Elevated LFTs 11/19/2011   Annual physical exam 09/18/2011   Neck pain-- chronic--UDS 05/20/2011   Chronic sinusitis, chronic cough (?COPD) 07/16/2009   ADHD 02/08/2008   POSITIVE PPD 02/08/2008   MENOPAUSAL SYNDROME 10/25/2007   DM w/o complication type II (HCC) 02/10/2007   High cholesterol 02/10/2007   Anxiety and depression 02/10/2007   HTN (hypertension) 02/10/2007    Current Outpatient Medications  Medication Sig Dispense Refill   aspirin EC 81 MG tablet Take 81 mg by mouth daily. Swallow whole.     azithromycin (ZITHROMAX Z-PAK) 250 MG tablet Take 2 tablets (500 mg) by mouth today, then 1 tablet (250 mg) by mouth daily x4 days. 6 tablet 0   bisoprolol-hydrochlorothiazide (ZIAC) 2.5-6.25 MG tablet Take 1 tablet by mouth daily. 90 tablet 1   blood glucose meter kit and supplies KIT Use up to 4 times daily as directed 1 each 0   Cholecalciferol (VITAMIN D3) 50 MCG (2000 UT) CAPS Take by mouth.     escitalopram (LEXAPRO) 20 MG tablet Take 1 tablet (20 mg total) by mouth daily. 90 tablet 1   ezetimibe (ZETIA) 10 MG tablet Take 1 tablet (10 mg total) by mouth daily. 90 tablet 1   glucose blood test strip Use as directed up to 4 times daily 100 each 0   Lancets MISC Use as directed 4 times  daily 100 each 0   metFORMIN (GLUCOPHAGE) 1000 MG tablet Take 1 tablet (1,000 mg total) by mouth daily with breakfast. 90 tablet 1   methocarbamol (ROBAXIN) 500 MG tablet Take 1 tablet (500 mg total) by mouth every 8 (eight) hours as needed for muscle spasms. 90 tablet 3   oxyCODONE-acetaminophen (PERCOCET/ROXICET) 5-325 MG tablet Take 1 tablet by mouth every 6 (six) hours as needed. 120 tablet 0   pioglitazone (ACTOS) 30 MG tablet Take 1 tablet (30 mg total) by mouth daily. 90 tablet 1   predniSONE (DELTASONE) 20 MG tablet Take 1 tablet (20 mg total) by mouth daily with breakfast. 5 tablet 0   rosuvastatin (CRESTOR) 10 MG tablet Take 1 tablet (10 mg total) by mouth every Monday, Wednesday, and Friday. 12 tablet 1   sitaGLIPtin (JANUVIA) 100 MG tablet Take 1 tablet (100 mg total) by mouth daily. 90 tablet 1   traZODone (DESYREL) 100 MG tablet Take 1 tablet (100 mg total) by mouth at bedtime as needed for sleep. 90 tablet 0   No current facility-administered medications for this visit.    Allergies: Benadryl [diphenhydramine], Erythromycin, Fluoxetine hcl, and Venlafaxine  Past Medical History:  Diagnosis Date   Abnormal Pap smear of cervix    h/o HPV   Chronic pain disorder    arthitis and fibromyalgia   Chronic sinusitis, chronic cough (?COPD) 07/16/2009   Depression  Diabetes mellitus    type 2   Dyslipidemia    Elevated LFTs 11/19/2011   Hypertension    Leukocytosis 11/19/2011   Menopausal syndrome    Neck pain 05/20/2011   Sarcoid    post partum in remission   Submandibular gland swelling    Right    Past Surgical History:  Procedure Laterality Date   abdominal plasty remotely     FINE NEEDLE ASPIRATION Left 12/09/2021   Procedure: LEFT KNEE  NEEDLE ASPIRATION;  Surgeon: Huel Cote, MD;  Location: [facility];  Service: Orthopedics;  Laterality: Left;   NASAL SEPTUM SURGERY  12/26/2015   ORIF ELBOW FRACTURE Right 12/09/2021   Procedure: OPEN REDUCTION INTERNAL FIXATION (ORIF)  ELBOW/OLECRANON FRACTURE;  Surgeon: Huel Cote, MD;  Location: [facility];  Service: Orthopedics;  Laterality: Right;   pilondial cyst removal     TUBAL LIGATION     TURBINATE REDUCTION Bilateral 12/26/2015    Family History  Problem Relation Age of Onset   Hypertension Other        GM   Colon cancer Other        COLON CA GGM x 2   Diabetes Other        aunt   CAD Maternal Grandfather 45   Stroke Neg Hx    Breast cancer Neg Hx     Social History   Tobacco Use   Smoking status: Every Day    Current packs/day: 1.00    Average packs/day: 1 pack/day for 28.0 years (28.0 ttl pk-yrs)    Types: Cigarettes   Smokeless tobacco: Never  Substance Use Topics   Alcohol use: Yes    Alcohol/week: 0.0 standard drinks of alcohol    Comment: rare    Subjective:   Patient presents with concerns for acute bronchitis; symptoms x 10 days; notes that started with a cough and symptoms have worsened; prone to episodes of bronchitis- historically has done very well on Zithromax;    Objective:  Vitals:   06/25/23 1342  BP: (!) 142/88  Pulse: 66  Resp: 18  SpO2: 94%  Weight: 167 lb 6.4 oz (75.9 kg)  Height: 5\' 7"  (1.702 m)    General: Well developed, well nourished, in no acute distress  Skin : Warm and dry.  Head: Normocephalic and atraumatic  Eyes: Sclera and conjunctiva clear; pupils round and reactive to light; extraocular movements intact  Ears: External normal; canals clear; tympanic membranes normal  Oropharynx: Pink, supple. No suspicious lesions  Neck: Supple without thyromegaly, adenopathy  Lungs: Respirations unlabored; coarse breath sounds noted in upper lobes;  CVS exam: normal rate and regular rhythm.  Neurologic: Alert and oriented; speech intact; face symmetrical; moves all extremities well; CNII-XII intact without focal deficit   Assessment:  1. Acute bronchitis, unspecified organism     Plan:  Rx for Z-pak #1 take as directed; Rx for Prednisone 20 mg every day x 5  days; increase fluids, rest and follow up worse, no better.   No follow-ups on file.  No orders of the defined types were placed in this encounter.   Requested Prescriptions   Signed Prescriptions Disp Refills   azithromycin (ZITHROMAX Z-PAK) 250 MG tablet 6 tablet 0    Sig: Take 2 tablets (500 mg) by mouth today, then 1 tablet (250 mg) by mouth daily x4 days.   predniSONE (DELTASONE) 20 MG tablet 5 tablet 0    Sig: Take 1 tablet (20 mg total) by mouth daily with breakfast.

## 2023-07-06 IMAGING — DX DG KNEE COMPLETE 4+V*L*
4 series · 4 of 4 positions shown · non-contrast
Comparison: None.

CLINICAL DATA: Trauma, fall

EXAM:
LEFT KNEE - COMPLETE 4 VIEW

[knee ap]
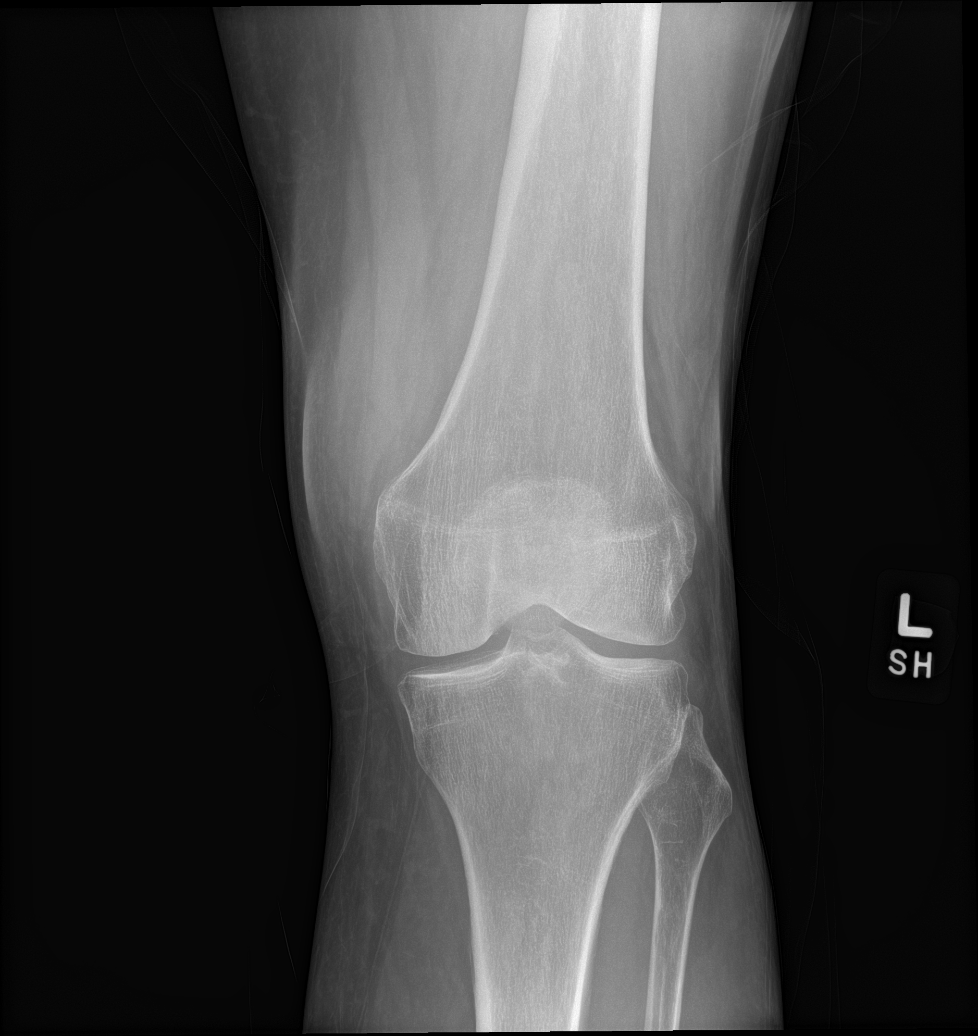

[knee obl (1 of 2)]
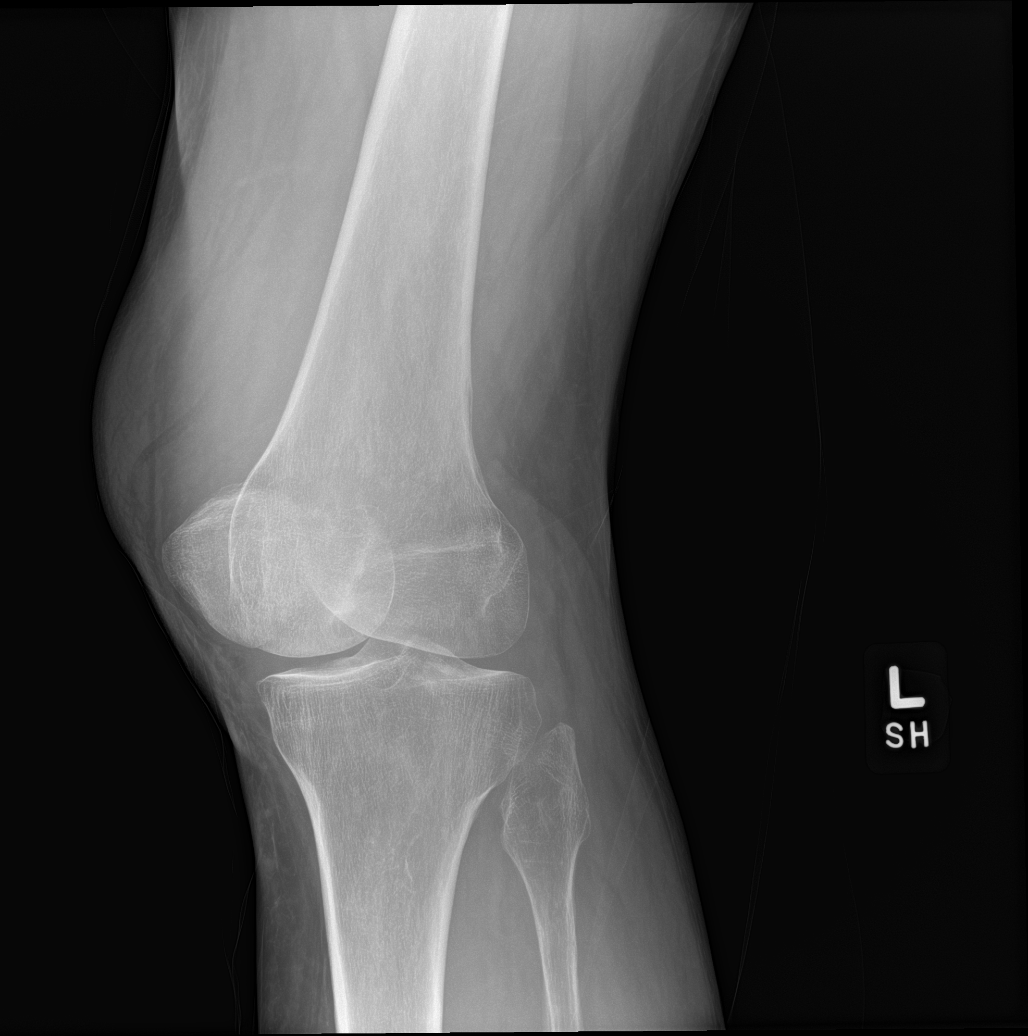

[knee obl (2 of 2)]
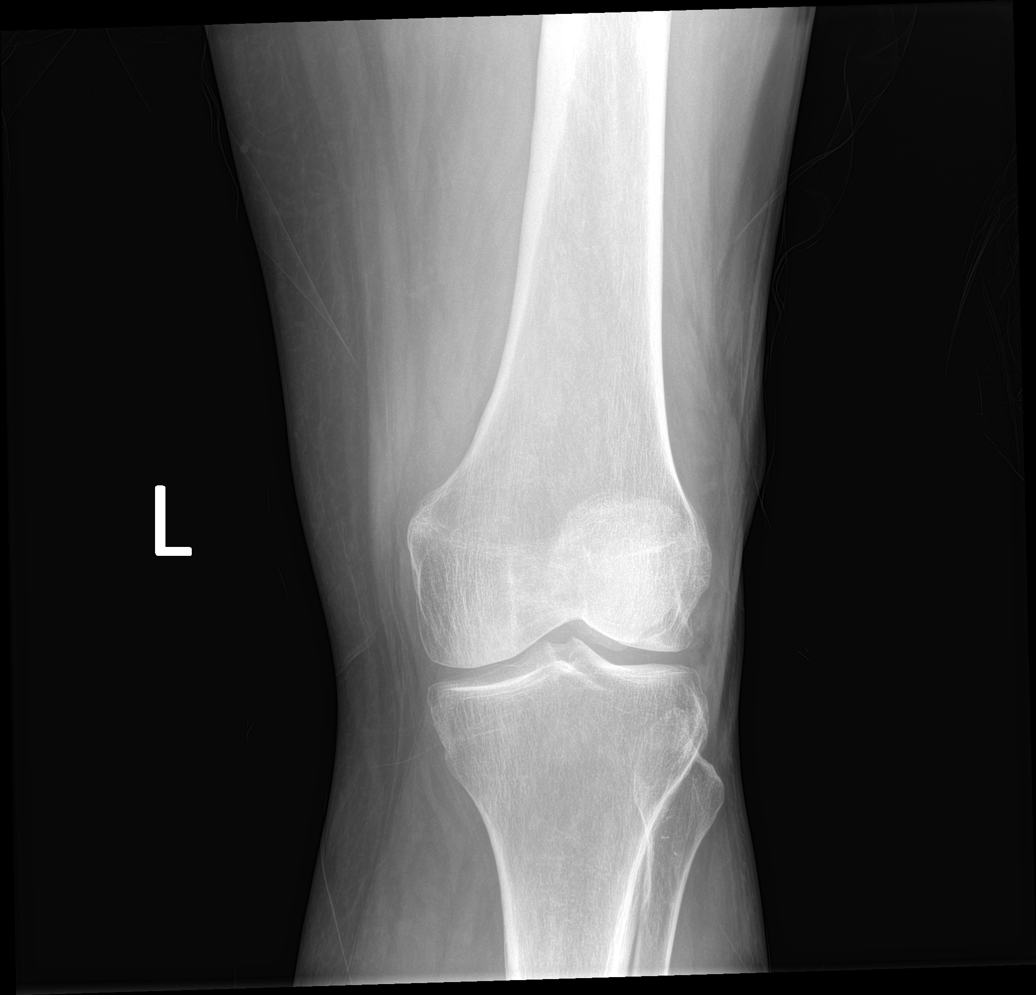

[knee lat]
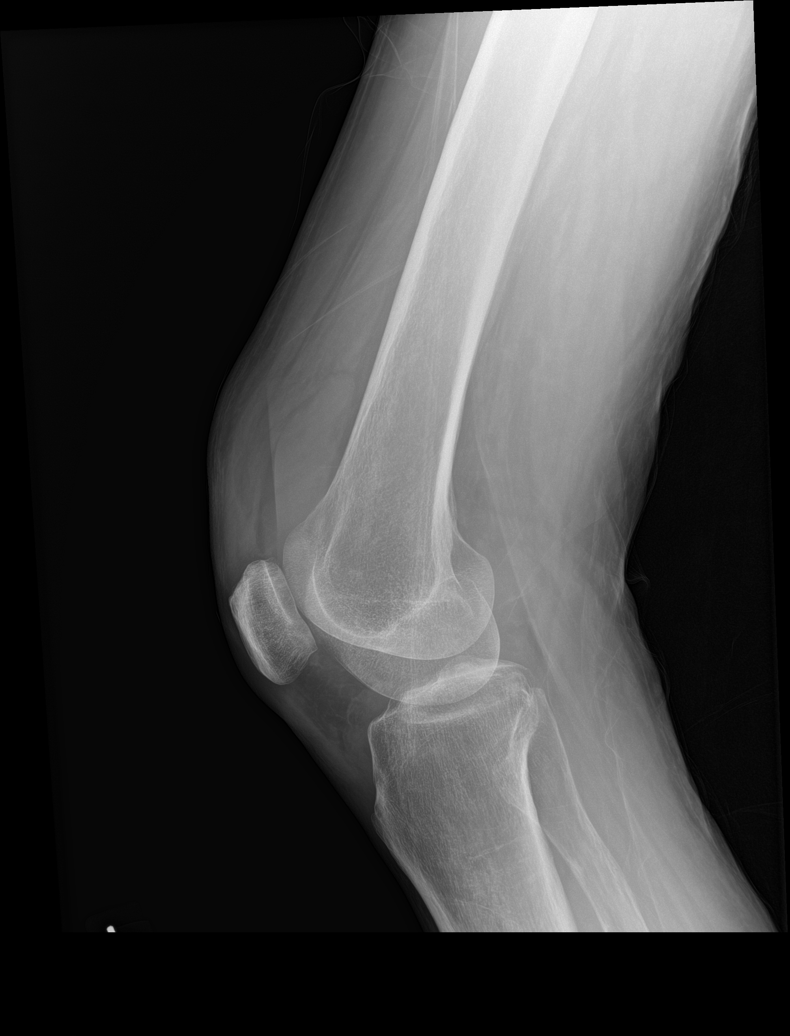

[4 of 4 positions shown; findings below may reference images not displayed]

FINDINGS: No displaced fracture or dislocation is seen. There is soft tissue
fullness in the suprapatellar bursa. There are no opaque foreign
bodies.
IMPRESSION: No recent fracture or dislocation is seen in the left knee. Moderate
to large effusion, possibly hemarthrosis in the suprapatellar bursa.

## 2023-07-08 IMAGING — RF DG ELBOW COMPLETE 3+V*R*
1 series · 5 of 5 positions shown · non-contrast
Comparison: Right elbow radiographs 12/07/2021

CLINICAL DATA: Intraoperative fluoroscopy. Open reduction internal
fixation olecranon fracture.

EXAM:
RIGHT ELBOW - COMPLETE 3+ VIEW

[Series 1: run · 5 of 5 slices shown]
[im 1/5]
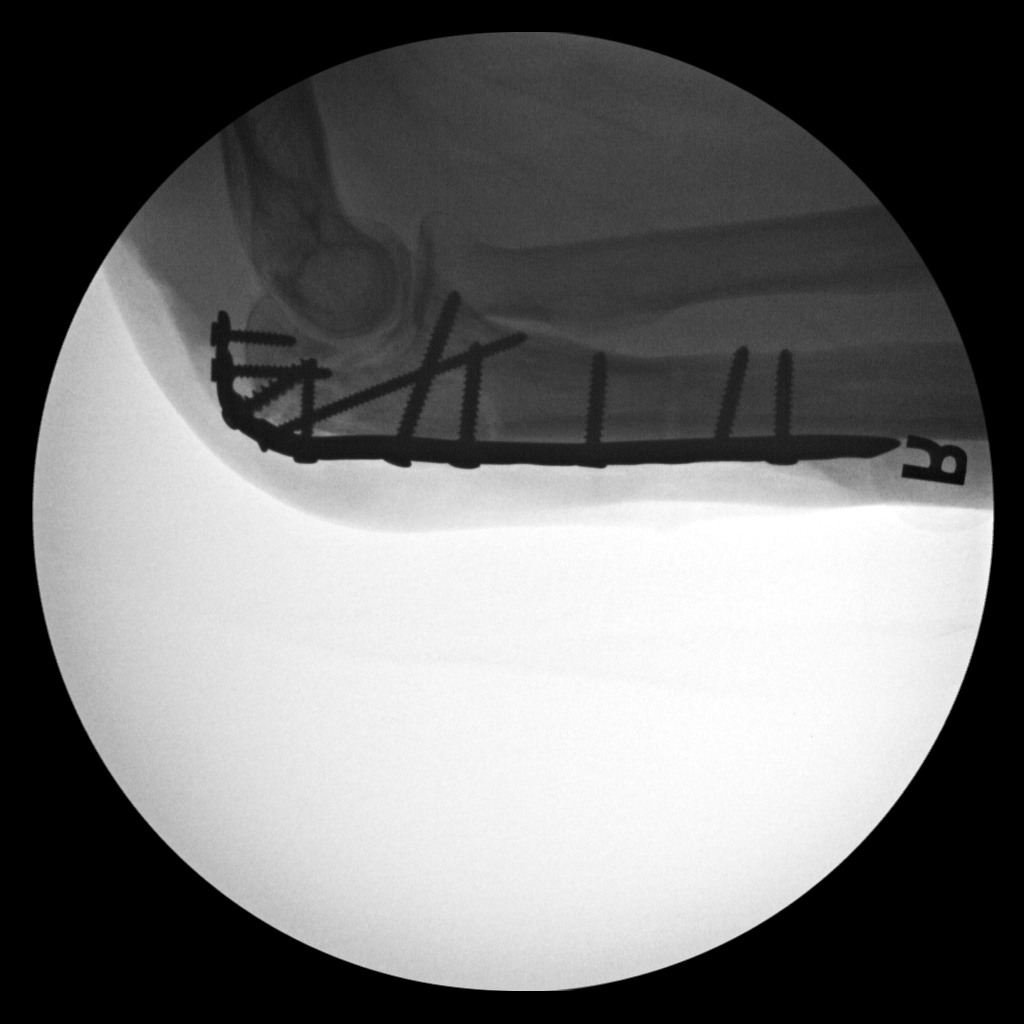
[im 2/5]
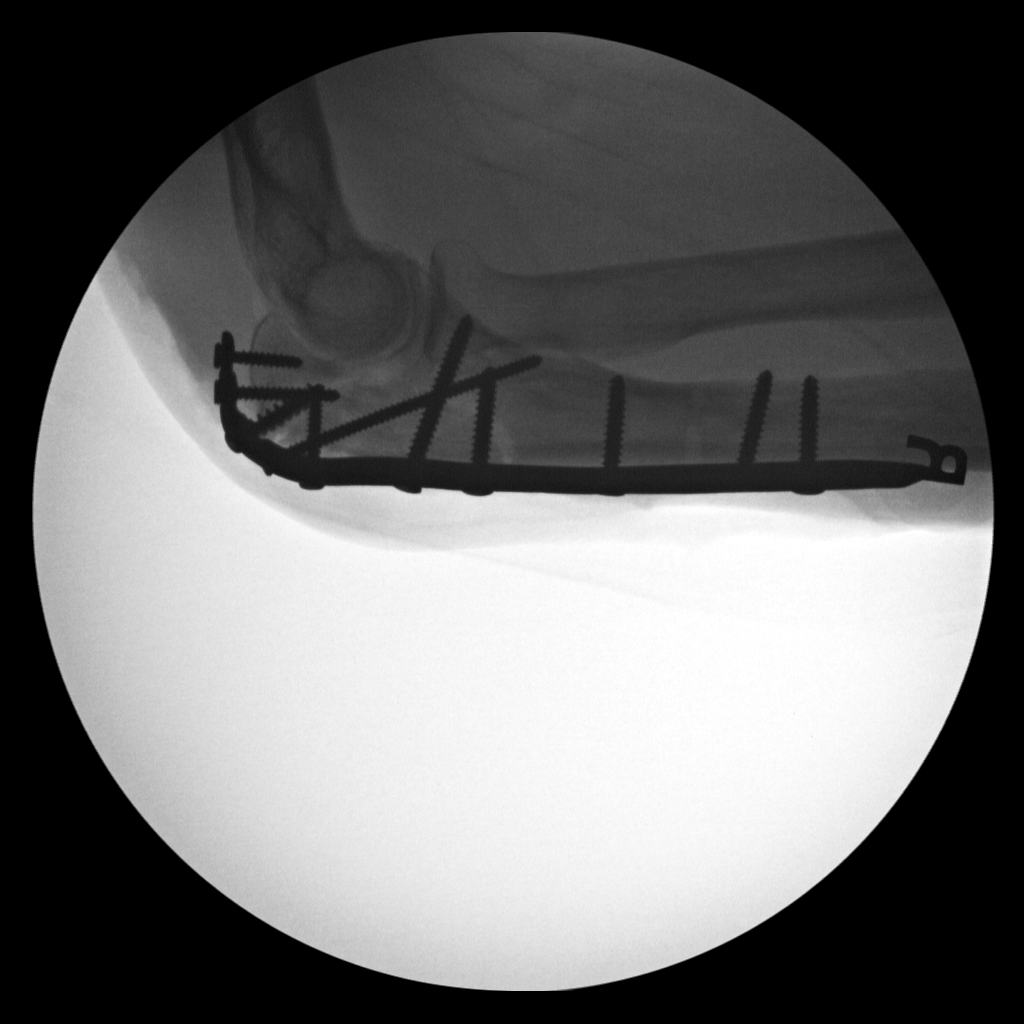
[im 3/5]
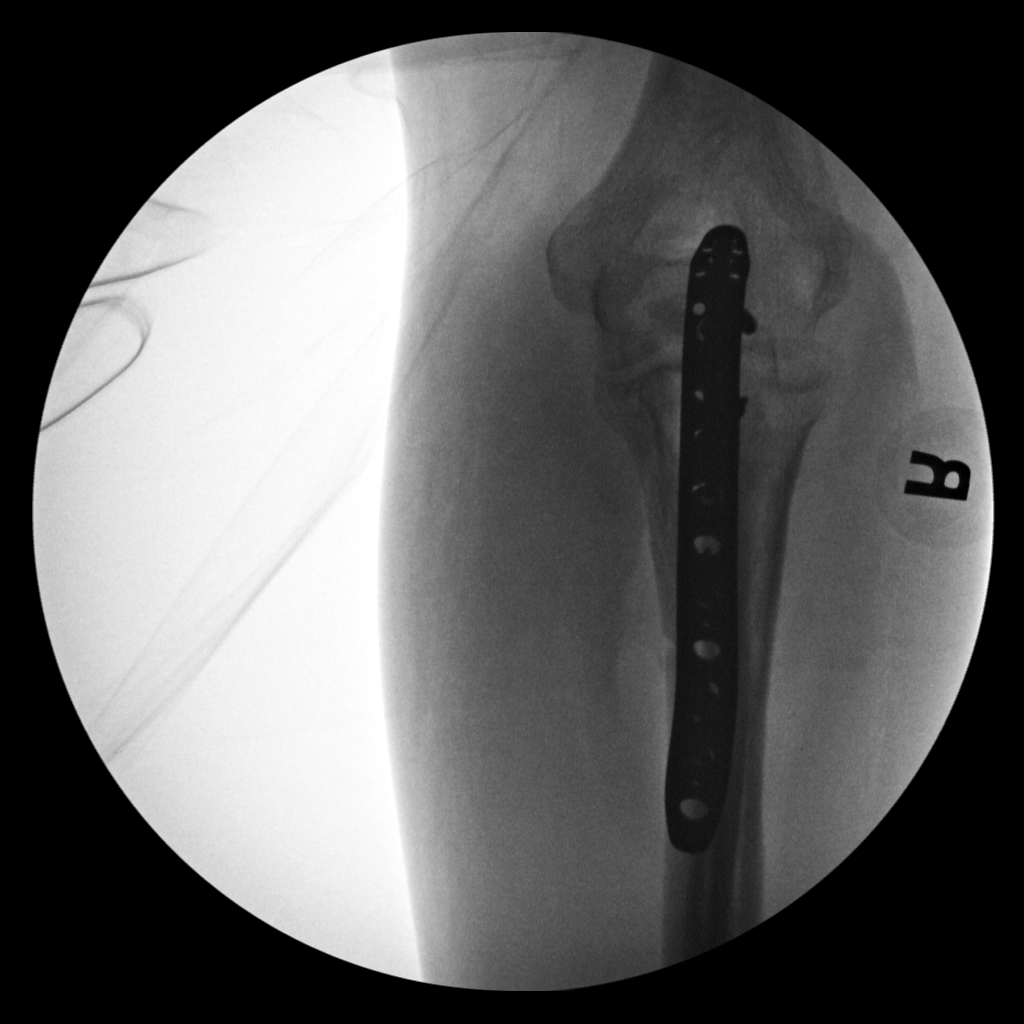
[im 4/5]
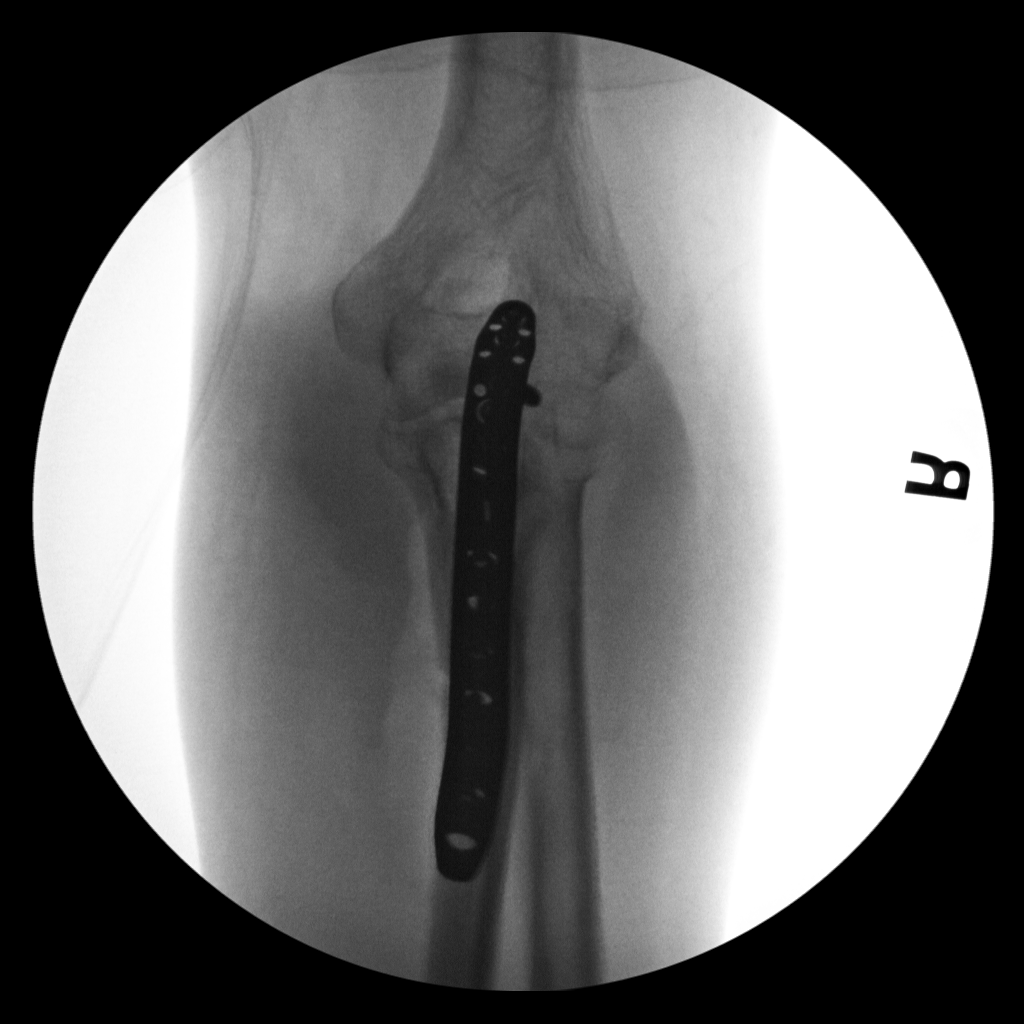
[im 5/5]
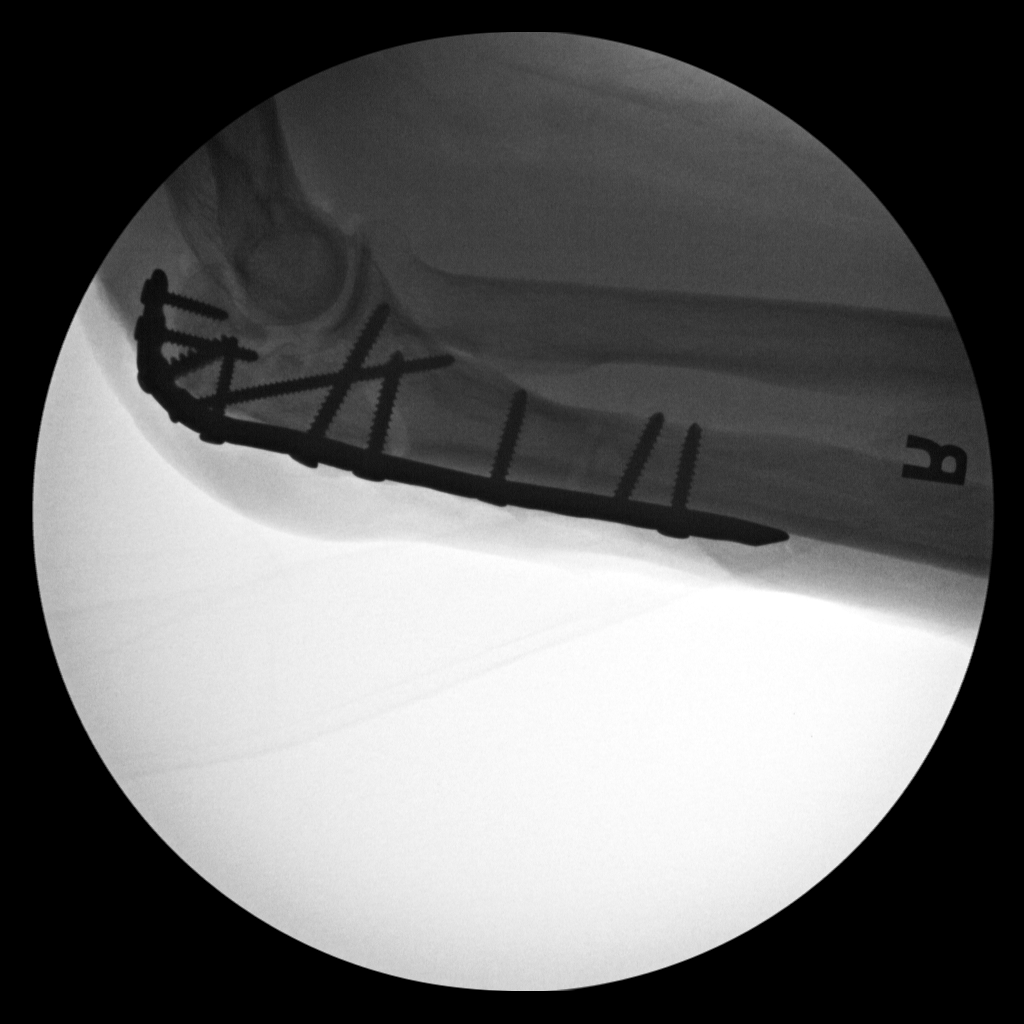

[5 of 5 positions shown; findings below may reference images not displayed]

FINDINGS: Images were performed intraoperatively without the presence of a
radiologist. The patient is undergoing posterior plate and screw
fixation of the previously seen proximal ulnar fracture.

Total fluoroscopy images: 5

Total fluoroscopy time: 160 seconds

Total dose: Radiation Exposure Index (as provided by the
fluoroscopic device): 2.82 mGy air Kerma

Please see intraoperative findings for further detail.
IMPRESSION: Intraoperative fluoro for olecranon ORIF.

## 2023-07-18 IMAGING — DX DG ELBOW 2V*R*
2 series · 2 of 2 positions shown · non-contrast
Comparison: December 09, 2021.

CLINICAL DATA: Status post surgical internal fixation of olecranon
fracture.

EXAM:
RIGHT ELBOW - 2 VIEW

[elbow ap]
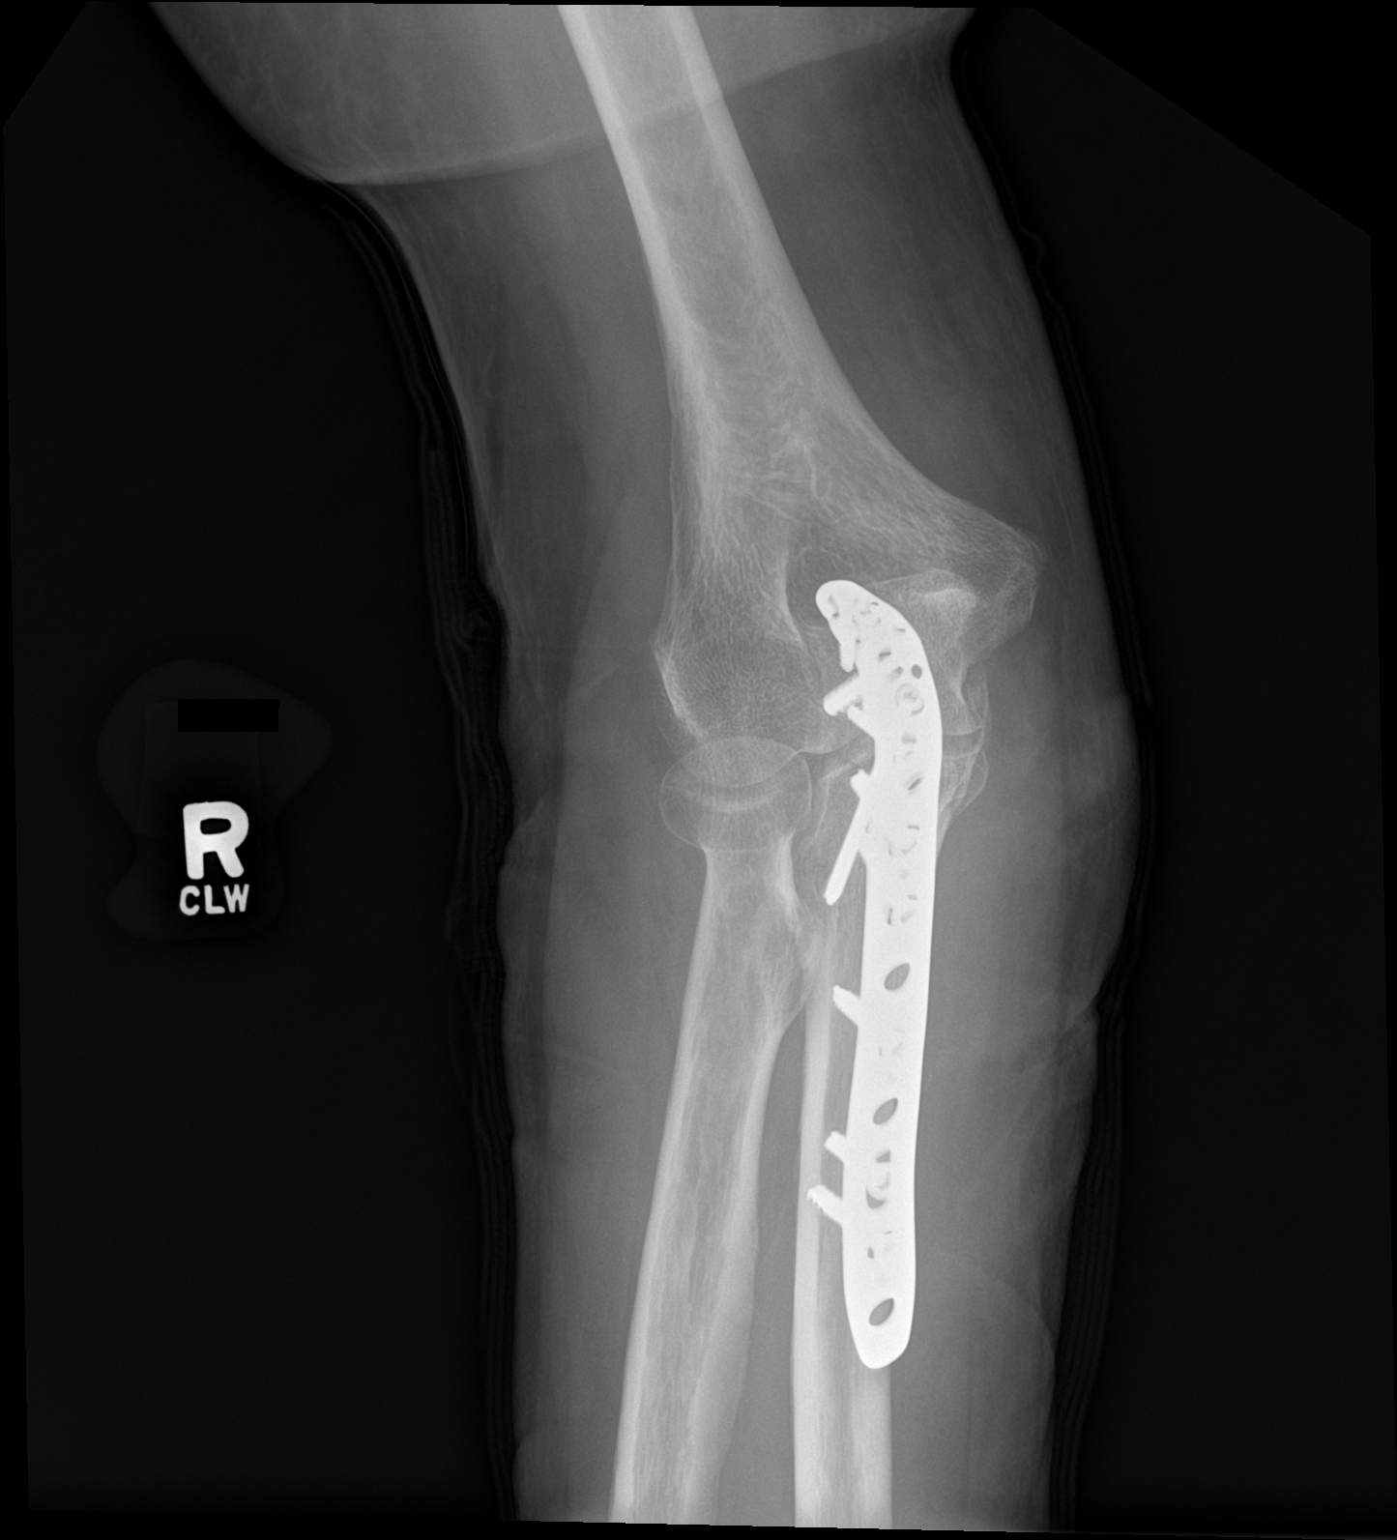

[elbow lat]
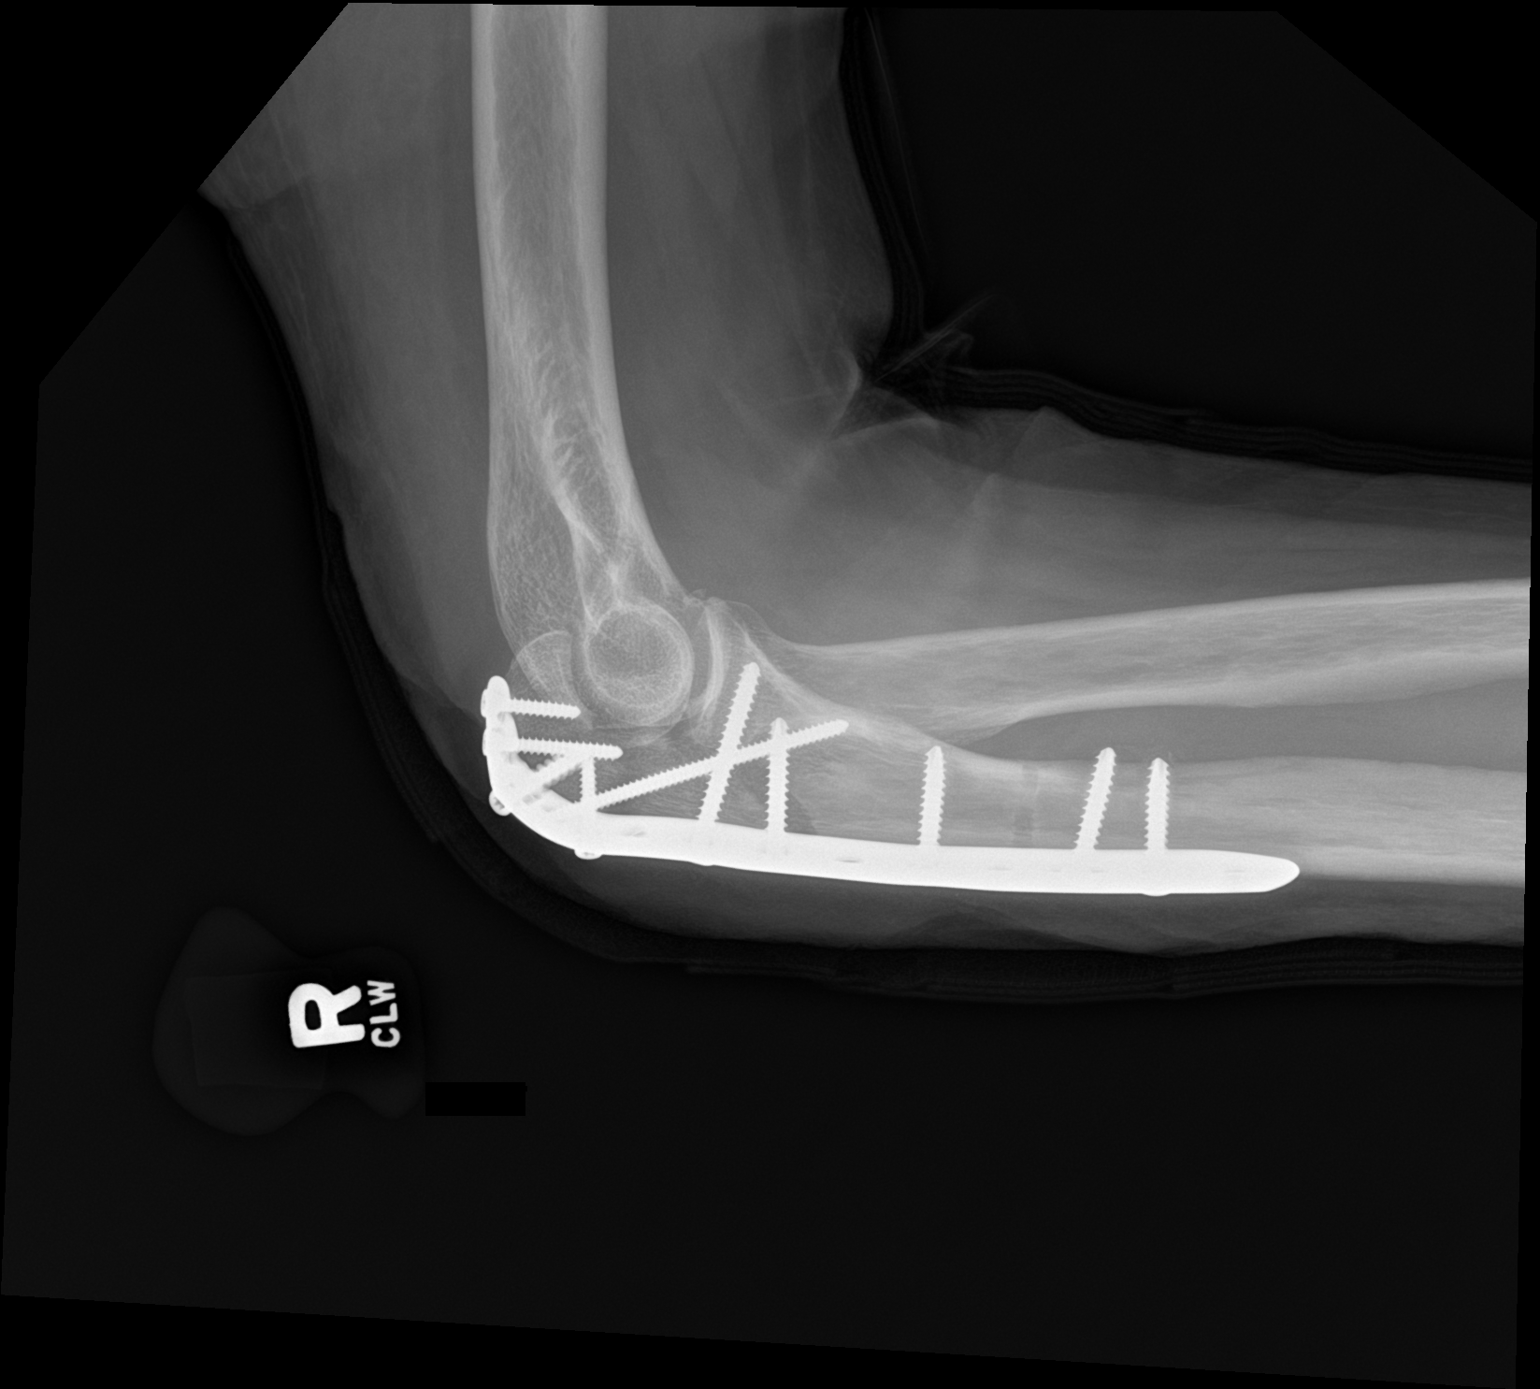

[2 of 2 positions shown; findings below may reference images not displayed]

FINDINGS: Status post surgical internal fixation of olecranon fracture. Good
alignment of fracture components is noted. No new fracture or
dislocation is noted.
IMPRESSION: Postsurgical changes as described above.

## 2023-07-21 ENCOUNTER — Other Ambulatory Visit (HOSPITAL_BASED_OUTPATIENT_CLINIC_OR_DEPARTMENT_OTHER): Payer: Self-pay

## 2023-07-22 IMAGING — DX DG KNEE 1-2V PORT*L*
1 series · 3 of 3 positions shown · non-contrast
Comparison: Left knee x-ray 12/07/2021.

CLINICAL DATA: Pain.

EXAM:
PORTABLE LEFT KNEE - 1-2 VIEW

[Series 1: knee · 0.14mm/px · 3 of 3 slices shown]
[im 1/3]
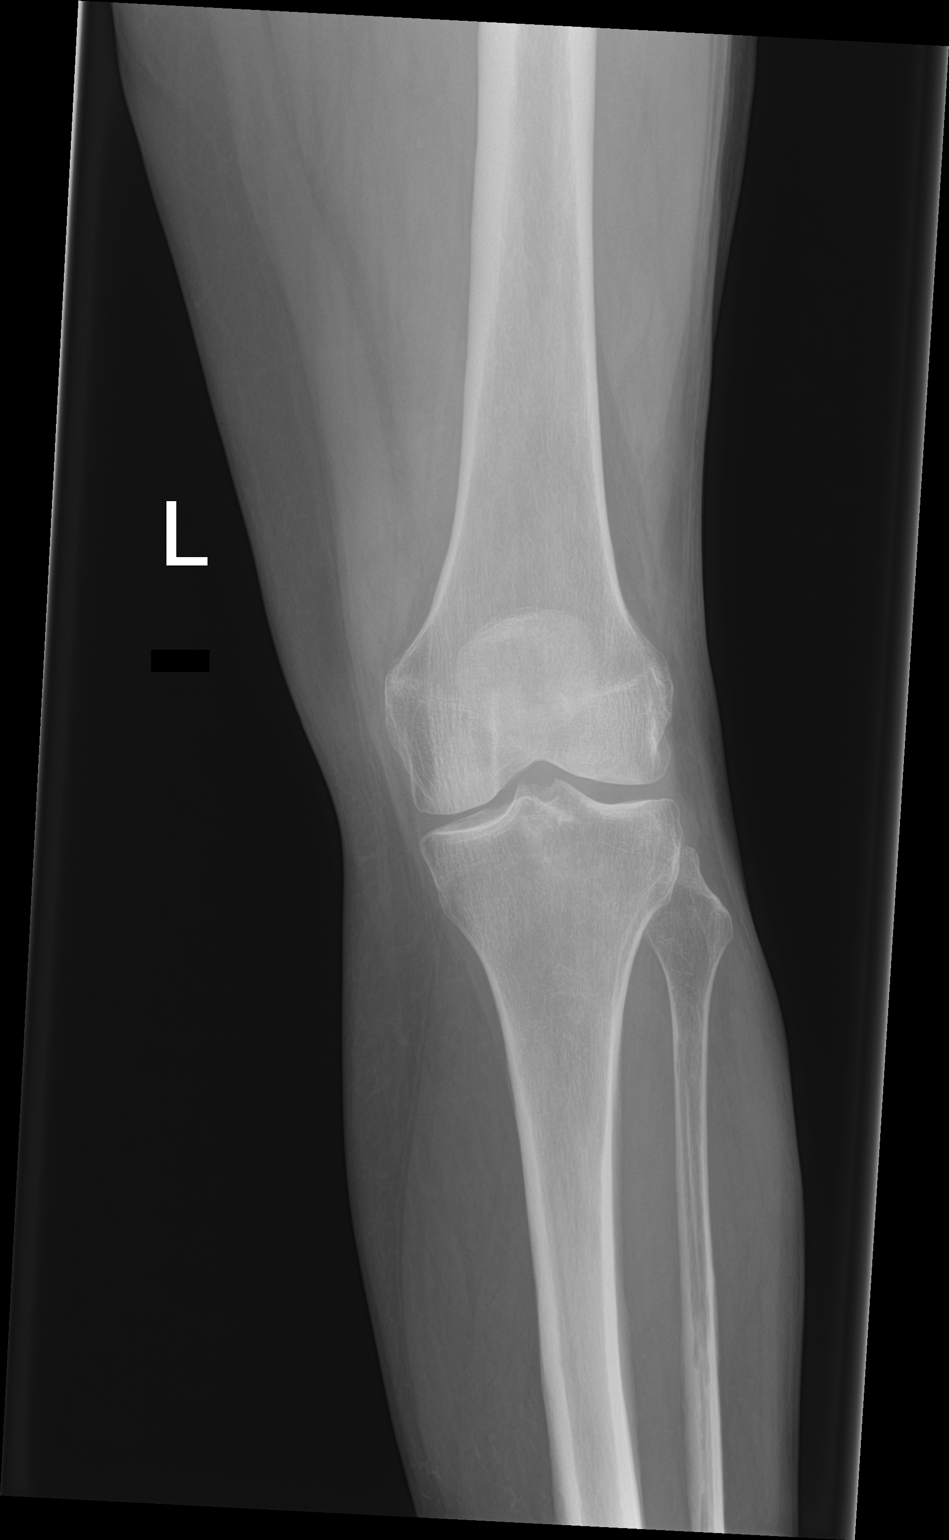
[im 2/3]
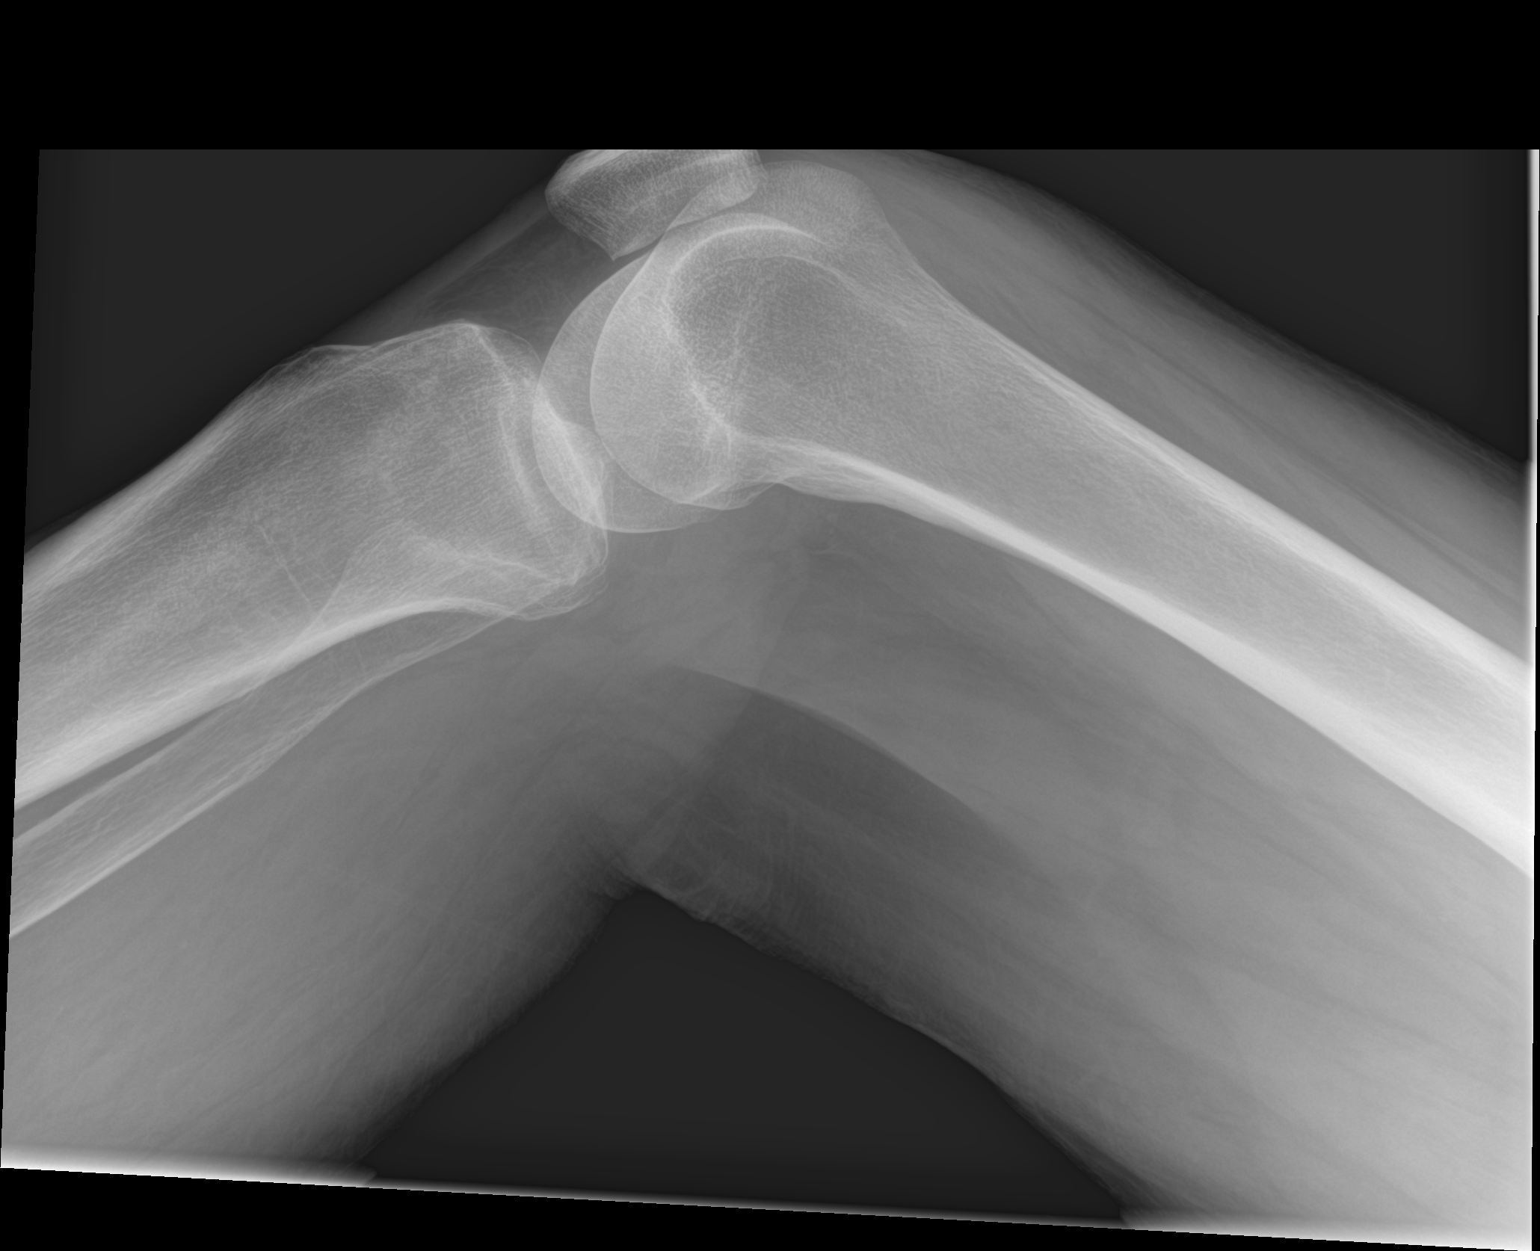
[im 3/3]
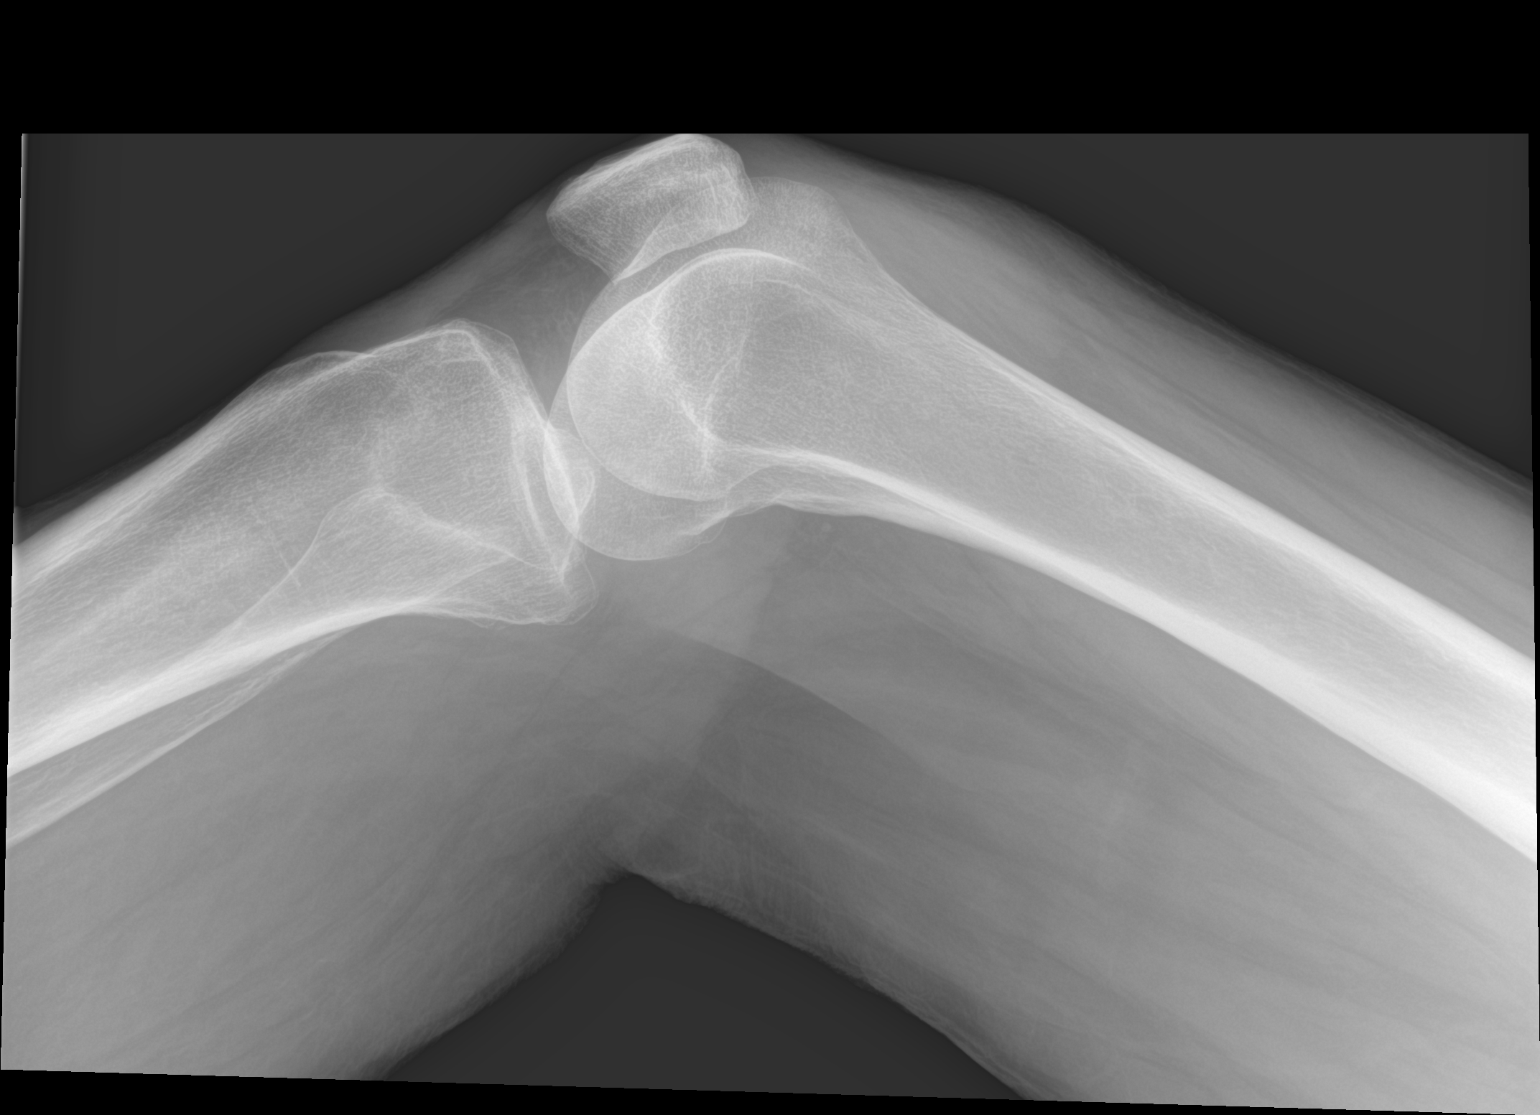

[3 of 3 positions shown; findings below may reference images not displayed]

FINDINGS: No evidence of fracture or dislocation. No evidence of arthropathy
or other focal bone abnormality. Suprapatellar soft tissue swelling
and joint effusion are present, but have decreased.
IMPRESSION: 1. Suprapatellar soft tissue swelling and joint effusion have
decreased, but have not completely resolved.
2. No acute fracture or dislocation identified.

## 2023-07-26 ENCOUNTER — Other Ambulatory Visit (HOSPITAL_BASED_OUTPATIENT_CLINIC_OR_DEPARTMENT_OTHER): Payer: Self-pay

## 2023-07-26 ENCOUNTER — Telehealth: Payer: Self-pay | Admitting: Internal Medicine

## 2023-07-26 ENCOUNTER — Other Ambulatory Visit: Payer: Self-pay | Admitting: Family

## 2023-07-26 ENCOUNTER — Other Ambulatory Visit: Payer: Self-pay | Admitting: Internal Medicine

## 2023-07-26 MED ORDER — OXYCODONE-ACETAMINOPHEN 5-325 MG PO TABS
1.0000 | ORAL_TABLET | Freq: Four times a day (QID) | ORAL | 0 refills | Status: DC | PRN
  Filled 2023-07-26: qty 120, 30d supply, fill #0

## 2023-07-26 NOTE — Telephone Encounter (Signed)
Requesting: oxycodone 5-325mg   Contract: 10/16/22 UDS: 05/24/23 Last Visit: 05/24/23 Next Visit: 09/21/23 Last Refill: 06/25/23 #120 and 0RF   Please Advise

## 2023-07-26 NOTE — Telephone Encounter (Signed)
PDMP okay, Rx sent 

## 2023-08-26 ENCOUNTER — Telehealth: Payer: Self-pay | Admitting: Internal Medicine

## 2023-08-26 ENCOUNTER — Other Ambulatory Visit (HOSPITAL_BASED_OUTPATIENT_CLINIC_OR_DEPARTMENT_OTHER): Payer: Self-pay

## 2023-08-26 MED ORDER — ROSUVASTATIN CALCIUM 10 MG PO TABS
10.0000 mg | ORAL_TABLET | ORAL | 1 refills | Status: DC
  Filled 2023-08-26: qty 12, 28d supply, fill #0
  Filled 2023-12-16: qty 12, 28d supply, fill #1

## 2023-08-26 NOTE — Telephone Encounter (Signed)
Requesting: oxycodone 5-325mg   Contract: 10/16/22 UDS: 05/24/23 Last Visit: 05/24/23 Next Visit: 09/21/23 Last Refill: 07/26/23 #120 and 0RF   Please Advise

## 2023-08-27 ENCOUNTER — Other Ambulatory Visit (HOSPITAL_BASED_OUTPATIENT_CLINIC_OR_DEPARTMENT_OTHER): Payer: Self-pay

## 2023-08-27 MED ORDER — OXYCODONE-ACETAMINOPHEN 5-325 MG PO TABS
1.0000 | ORAL_TABLET | Freq: Four times a day (QID) | ORAL | 0 refills | Status: DC | PRN
  Filled 2023-08-27: qty 120, 30d supply, fill #0

## 2023-08-27 NOTE — Telephone Encounter (Signed)
PDMP okay, Rx sent 

## 2023-09-15 ENCOUNTER — Other Ambulatory Visit (HOSPITAL_BASED_OUTPATIENT_CLINIC_OR_DEPARTMENT_OTHER): Payer: Self-pay

## 2023-09-21 ENCOUNTER — Telehealth: Payer: Self-pay | Admitting: *Deleted

## 2023-09-21 ENCOUNTER — Ambulatory Visit (INDEPENDENT_AMBULATORY_CARE_PROVIDER_SITE_OTHER): Payer: Self-pay | Admitting: Internal Medicine

## 2023-09-21 ENCOUNTER — Other Ambulatory Visit (HOSPITAL_BASED_OUTPATIENT_CLINIC_OR_DEPARTMENT_OTHER): Payer: Self-pay

## 2023-09-21 ENCOUNTER — Encounter: Payer: Self-pay | Admitting: Internal Medicine

## 2023-09-21 VITALS — BP 132/80 | HR 68 | Temp 98.1°F | Resp 16 | Ht 67.0 in | Wt 169.5 lb

## 2023-09-21 DIAGNOSIS — F419 Anxiety disorder, unspecified: Secondary | ICD-10-CM

## 2023-09-21 DIAGNOSIS — Z7984 Long term (current) use of oral hypoglycemic drugs: Secondary | ICD-10-CM

## 2023-09-21 DIAGNOSIS — E7800 Pure hypercholesterolemia, unspecified: Secondary | ICD-10-CM

## 2023-09-21 DIAGNOSIS — E119 Type 2 diabetes mellitus without complications: Secondary | ICD-10-CM

## 2023-09-21 DIAGNOSIS — F32A Depression, unspecified: Secondary | ICD-10-CM

## 2023-09-21 MED ORDER — TRAZODONE HCL 100 MG PO TABS
100.0000 mg | ORAL_TABLET | Freq: Every evening | ORAL | 1 refills | Status: AC | PRN
  Filled 2023-09-21: qty 90, 90d supply, fill #0

## 2023-09-21 MED ORDER — PIOGLITAZONE HCL 30 MG PO TABS
30.0000 mg | ORAL_TABLET | Freq: Every day | ORAL | 1 refills | Status: AC
  Filled 2023-09-21: qty 90, 90d supply, fill #0
  Filled 2024-03-14: qty 90, 90d supply, fill #1

## 2023-09-21 NOTE — Progress Notes (Signed)
Subjective:    Patient ID: Alexandra Parsons, female    DOB: March 11, 1961, 63 y.o.   MRN: [id number]  DOS:  09/21/2023 Type of visit - description: f/u  Since the last office visit things are stable. Needs some refills.  Wt Readings from Last 3 Encounters:  09/21/23 169 lb 8 oz (76.9 kg)  06/25/23 167 lb 6.4 oz (75.9 kg)  05/24/23 164 lb 2 oz (74.4 kg)      Review of Systems See above   Past Medical History:  Diagnosis Date   Abnormal Pap smear of cervix    h/o HPV   Chronic pain disorder    arthitis and fibromyalgia   Chronic sinusitis, chronic cough (?COPD) 07/16/2009   Depression    Diabetes mellitus    type 2   Dyslipidemia    Elevated LFTs 11/19/2011   Hypertension    Leukocytosis 11/19/2011   Menopausal syndrome    Neck pain 05/20/2011   Sarcoid    post partum in remission   Submandibular gland swelling    Right    Past Surgical History:  Procedure Laterality Date   abdominal plasty remotely     FINE NEEDLE ASPIRATION Left 12/09/2021   Procedure: LEFT KNEE  NEEDLE ASPIRATION;  Surgeon: Huel Cote, MD;  Location: [facility];  Service: Orthopedics;  Laterality: Left;   NASAL SEPTUM SURGERY  12/26/2015   ORIF ELBOW FRACTURE Right 12/09/2021   Procedure: OPEN REDUCTION INTERNAL FIXATION (ORIF) ELBOW/OLECRANON FRACTURE;  Surgeon: Huel Cote, MD;  Location: [facility];  Service: Orthopedics;  Laterality: Right;   pilondial cyst removal     TUBAL LIGATION     TURBINATE REDUCTION Bilateral 12/26/2015    Current Outpatient Medications  Medication Instructions   aspirin EC 81 mg, Daily   bisoprolol-hydrochlorothiazide (ZIAC) 2.5-6.25 MG tablet 1 tablet, Oral, Daily   blood glucose meter kit and supplies KIT Use up to 4 times daily as directed   Cholecalciferol (VITAMIN D3) 50 MCG (2000 UT) CAPS Take by mouth.   escitalopram (LEXAPRO) 20 mg, Oral, Daily   ezetimibe (ZETIA) 10 mg, Oral, Daily   glucose blood test strip Use as directed up to 4 times daily   Lancets  MISC Use as directed 4 times daily   metFORMIN (GLUCOPHAGE) 1,000 mg, Oral, Daily with breakfast   methocarbamol (ROBAXIN) 500 mg, Oral, Every 8 hours PRN   oxyCODONE-acetaminophen (PERCOCET/ROXICET) 5-325 MG tablet 1 tablet, Oral, Every 6 hours PRN   pioglitazone (ACTOS) 30 mg, Oral, Daily   rosuvastatin (CRESTOR) 10 mg, Oral, Every M-W-F   traZODone (DESYREL) 100 mg, Oral, At bedtime PRN       Objective:   Physical Exam BP 132/80   Pulse 68   Temp 98.1 F (36.7 C) (Oral)   Resp 16   Ht 5\' 7"  (1.702 m)   Wt 169 lb 8 oz (76.9 kg)   SpO2 97%   BMI 26.55 kg/m  General:   Well developed, NAD, BMI noted. HEENT:  Normocephalic . Face symmetric, atraumatic Lungs:  CTA B Normal respiratory effort, no intercostal retractions, no accessory muscle use. Heart: RRR,  no murmur.  Lower extremities: no pretibial edema bilaterally  Skin: Not pale. Not jaundice Neurologic:  alert & oriented X3.  Speech normal, gait appropriate for age and unassisted Psych--  Cognition and judgment appear intact.  Cooperative with normal attention span and concentration.  Behavior appropriate. No anxious or depressed appearing.      Assessment    Problem list DM  HTN Dyslipidemia: intolerant to zocor ~ 2014 and  Lipitor 04/2018 Vit d def   CT 09-2018 ---COPD ---Pulmonary nodules, declined further CTs on 05-2019 --- (+)  Coronary calcium   Smoker ~1  ppd Psych: --Depression anxiety --ADHD MSK:  Fibromyalgia  (see OV 01/07/2021, 05/09/2021) Neck pain, chronic, on pain meds prn, UDS 09-2013 risk; intolerant to vicodin, oxycodone ok Fall 12/2021: R elbow, L knee fracture GYN: --BTL, Menopausal , h/o  Abnormal Pap smear, HPV Elevated LFTs: Hepatitis serology (-) 2008 H/o Sarcoid postpartum in remission H/o Chronic cough: Chronic sinusitis? COPD? H/o Leukocytosis  H/o +  PPD   PLAN DM:   Currently on Januvia, metformin, pioglitazone.  Will not be able to afford Januvia going forward.   Fortunately last A1c was 5.9 consequently will continue with metformin and pioglitazone, recheck on RTC. HTN: Last BMP okay, on Ziac, blood pressure satisfactory Weight loss: It has stopped. Pain management, RF Percocet, Robaxin as needed. Anxiety depression: Controlled on Lexapro, refill trazodone. High cholesterol: On Zetia, rosuvastatin, cost is an issue but for now we agreed to continue same meds. Social: His Worker's Compensation case is still on dispute, she has no insurance, referred to our Child psychotherapist.  Medicaid?Marland Kitchen RTC 3 to 4 months.

## 2023-09-21 NOTE — Progress Notes (Signed)
Complex Care Management Note  Care Guide Note 09/21/2023 Name: Alexandra Parsons MRN: [id number] DOB: 1961/07/27  Alexandra Parsons is a 63 y.o. year old female who sees Drue Novel, Nolon Rod, MD for primary care. I reached out to Dorathy Kinsman by phone today to offer complex care management services.  Ms. Canby was given information about Complex Care Management services today including:   The Complex Care Management services include support from the care team which includes your Nurse Care Manager, Clinical Social Worker, or Pharmacist.  The Complex Care Management team is here to help remove barriers to the health concerns and goals most important to you. Complex Care Management services are voluntary, and the patient may decline or stop services at any time by request to their care team member.   Complex Care Management Consent Status: Patient agreed to services and verbal consent obtained.   Follow up plan:  Telephone appointment with complex care management team member scheduled for:  09/28/2023  Encounter Outcome:  Patient Scheduled  Burman Nieves, CMA, Care Guide [facility]  [facility], [facility] Guide Direct Dial: [phone]  Fax: [phone] Website: [email or web address]

## 2023-09-21 NOTE — Patient Instructions (Addendum)
Stop Januvia once you run out.   I am referring you to a social worker to see if you have any insurance options, Medicaid?Marland Kitchen   Next visit with me in 3 to 4 months. Please schedule it at the front desk

## 2023-09-21 NOTE — Assessment & Plan Note (Signed)
DM:   Currently on Januvia, metformin, pioglitazone.  Will not be able to afford Januvia going forward.  Fortunately last A1c was 5.9 consequently will continue with metformin and pioglitazone, recheck on RTC. HTN: Last BMP okay, on Ziac, blood pressure satisfactory Weight loss: It has stopped. Pain management, RF Percocet, Robaxin as needed. Anxiety depression: Controlled on Lexapro, refill trazodone. High cholesterol: On Zetia, rosuvastatin, cost is an issue but for now we agreed to continue same meds. Social: His Worker's Compensation case is still on dispute, she has no insurance, referred to our Child psychotherapist.  Medicaid?Marland Kitchen RTC 3 to 4 months.

## 2023-09-22 ENCOUNTER — Other Ambulatory Visit (HOSPITAL_BASED_OUTPATIENT_CLINIC_OR_DEPARTMENT_OTHER): Payer: Self-pay

## 2023-09-27 ENCOUNTER — Telehealth: Payer: Self-pay | Admitting: Internal Medicine

## 2023-09-27 ENCOUNTER — Other Ambulatory Visit (HOSPITAL_BASED_OUTPATIENT_CLINIC_OR_DEPARTMENT_OTHER): Payer: Self-pay

## 2023-09-27 NOTE — Telephone Encounter (Signed)
Requesting: oxycodone 5-325mg   Contract: 10/16/22 UDS: 05/24/23 Last Visit: 09/21/23 Next Visit: 01/19/24  Last Refill: 08/27/23 #120 and 0RF   Please Advise

## 2023-09-28 ENCOUNTER — Other Ambulatory Visit (HOSPITAL_BASED_OUTPATIENT_CLINIC_OR_DEPARTMENT_OTHER): Payer: Self-pay

## 2023-09-28 ENCOUNTER — Ambulatory Visit: Payer: Self-pay | Admitting: Licensed Clinical Social Worker

## 2023-09-28 MED ORDER — OXYCODONE-ACETAMINOPHEN 5-325 MG PO TABS
1.0000 | ORAL_TABLET | Freq: Four times a day (QID) | ORAL | 0 refills | Status: DC | PRN
  Filled 2023-09-28: qty 120, 30d supply, fill #0

## 2023-09-28 NOTE — Patient Outreach (Signed)
  Care Coordination   09/28/2023 Name: VIVECA BECKSTROM MRN: [id number] DOB: 1961/07/17   Care Coordination Outreach Attempts:  An unsuccessful outreach was attempted for an appointment today.  Follow Up Plan:  Additional outreach attempts will be made to offer the patient complex care management information and services.   Encounter Outcome:  No Answer   Care Coordination Interventions:  No, not indicated    Jeanie Cooks, PhD [facility], [facility] Social Worker Direct Dial: [phone]  Fax: [phone]

## 2023-09-28 NOTE — Telephone Encounter (Signed)
 PDMP okay, Rx sent

## 2023-10-19 ENCOUNTER — Ambulatory Visit: Payer: Self-pay | Admitting: Licensed Clinical Social Worker

## 2023-10-19 NOTE — Patient Instructions (Signed)
 Visit Information  Thank you for taking time to visit with me today. Please don't hesitate to contact me if I can be of assistance to you.   Following are the goals we discussed today:   Goals Addressed             This Visit's Progress    Care Coordination Activities       Care Coordination Interventions: Patient stated that she is currently on workman's comp and it was cut after 1 month and 23 days and it is now being appealed. She is using her 401 funds to live off of for now and hopes to get her workman's comp back.  SW will refer the patient to the walk in clinic to apply for medicaid. The SW went over all the other SDOH questions and no other needs at this time. Sw will follow p on 11/05/2023 at 3:00 pm         Our next appointment is by telephone on 11/05/2023 at 3:00 pm  Please call the care guide team at [phone] if you need to cancel or reschedule your appointment.   If you are experiencing a Mental Health or Behavioral Health Crisis or need someone to talk to, please call the Suicide and Crisis Lifeline: 988 go to [facility] Urgent [facility] [address] [place] [phone]) call 911  Patient verbalizes understanding of instructions and care plan provided today and agrees to view in MyChart. Active MyChart status and patient understanding of how to access instructions and care plan via MyChart confirmed with patient.     Jeanie Cooks, PhD [facility], [facility] Social Worker Direct Dial: [phone]  Fax: [phone]

## 2023-10-19 NOTE — Patient Outreach (Signed)
 Care Coordination   Initial Visit Note   10/19/2023 Name: Alexandra Parsons MRN: [id number] DOB: [birth year]  Alexandra Parsons is a 63 y.o. year old female who sees Alexandra Parsons, Alexandra Rod, MD for primary care. I spoke with  Alexandra Parsons by phone today.  What matters to the patients health and wellness today?  Medicaid    Goals Addressed             This Visit's Progress    Care Coordination Activities       Care Coordination Interventions: Patient stated that she is currently on workman's comp and it was cut after 1 month and 23 days and it is now being appealed. She is using her 401 funds to live off of for now and hopes to get her workman's comp back.  SW will refer the patient to the walk in clinic to apply for medicaid. The SW went over all the other SDOH questions and no other needs at this time. Sw will follow p on 11/05/2023 at 3:00 pm         SDOH assessments and interventions completed:  Yes  SDOH Interventions Today    Flowsheet Row Most Recent Value  SDOH Interventions   Food Insecurity Interventions Intervention Not Indicated  Housing Interventions Intervention Not Indicated  Transportation Interventions Intervention Not Indicated  Utilities Interventions Intervention Not Indicated        Care Coordination Interventions:  Yes, provided  Interventions Today    Flowsheet Row Most Recent Value  General Interventions   General Interventions Discussed/Reviewed General Interventions Discussed, Science writer wants to apply for Mediciad, SW will refer to the Medicaid walk in clinic]        Follow up plan: Follow up call scheduled for 11/05/2023 at 3:00 pm    Encounter Outcome:  Patient Visit Completed   Alexandra Cooks, PhD [facility], [facility] Social Worker Direct Dial: [phone]  Fax: [phone]

## 2023-10-25 ENCOUNTER — Telehealth: Payer: Self-pay | Admitting: Internal Medicine

## 2023-10-25 NOTE — Telephone Encounter (Signed)
 Requesting: oxycodone 5-325mg   Contract:10/16/22 UDS: 05/24/23 Last Visit: 09/21/23 Next Visit: 01/19/24 Last Refill: 09/28/23 #120 and 0RF   Please Advise

## 2023-10-25 NOTE — Telephone Encounter (Signed)
 Copied from CRM [id number]. Topic: Clinical - Medication Refill >> Oct 25, 2023  8:55 AM Kathryne Eriksson wrote: Most Recent Primary Care Visit:  Provider: Willow Ora E  Department: [facility]  Visit Type: OFFICE VISIT  Date: 09/21/2023  Medication: methocarbamol (ROBAXIN) 500 MG tablet , oxyCODONE-acetaminophen (PERCOCET/ROXICET) 5-325 MG tablet  Has the patient contacted their pharmacy? Yes (Agent: If no, request that the patient contact the pharmacy for the refill. If patient does not wish to contact the pharmacy document the reason why and proceed with request.) (Agent: If yes, when and what did the pharmacy advise?)  Is this the correct pharmacy for this prescription? Yes If no, delete pharmacy and type the correct one.  This is the patient's preferred pharmacy:  [facility] [place] [facility] [address] [place] [address] Phone: [phone] Fax: [phone]   Has the prescription been filled recently? No  Is the patient out of the medication? Yes  Has the patient been seen for an appointment in the last year OR does the patient have an upcoming appointment? Yes  Can we respond through MyChart? Yes  Agent: Please be advised that Rx refills may take up to 3 business days. We ask that you follow-up with your pharmacy.

## 2023-10-26 ENCOUNTER — Other Ambulatory Visit (HOSPITAL_BASED_OUTPATIENT_CLINIC_OR_DEPARTMENT_OTHER): Payer: Self-pay

## 2023-10-26 MED ORDER — OXYCODONE-ACETAMINOPHEN 5-325 MG PO TABS
1.0000 | ORAL_TABLET | Freq: Four times a day (QID) | ORAL | 0 refills | Status: DC | PRN
  Filled 2023-10-26: qty 120, 30d supply, fill #0

## 2023-10-26 MED ORDER — METHOCARBAMOL 500 MG PO TABS
500.0000 mg | ORAL_TABLET | Freq: Three times a day (TID) | ORAL | 3 refills | Status: DC | PRN
  Filled 2023-10-26: qty 90, 30d supply, fill #0
  Filled 2023-11-23: qty 90, 30d supply, fill #1
  Filled 2023-12-16: qty 90, 30d supply, fill #2
  Filled 2024-01-12: qty 90, 30d supply, fill #3

## 2023-10-26 NOTE — Telephone Encounter (Signed)
 PDMP okay, Rx sent

## 2023-11-05 ENCOUNTER — Ambulatory Visit: Payer: Self-pay | Admitting: Licensed Clinical Social Worker

## 2023-11-05 NOTE — Patient Instructions (Signed)
 Visit Information  Thank you for taking time to visit with me today. Please don't hesitate to contact me if I can be of assistance to you.   Following are the goals we discussed today:   Goals Addressed             This Visit's Progress    Care Coordination Activities   On track    Care Coordination Interventions: Patient stated that she is currently on workman's comp and it was cut after 1 month and 23 days and it is now being appealed. She is using her 401 funds to live off of for now and hopes to get her workman's comp back.  SW will refer the patient to the walk in clinic to apply for medicaid. The SW went over all the other SDOH questions and no other needs at this time. Sw will follow p on 11/19/2023 at 3:00 pm         Our next appointment is by telephone on 11/19/2023 at 3:00 pm  Please call the care guide team at [phone] if you need to cancel or reschedule your appointment.   If you are experiencing a Mental Health or Behavioral Health Crisis or need someone to talk to, please call the Suicide and Crisis Lifeline: 988 go to [facility] Urgent [facility] [address] [place] [phone]) call 911  Patient verbalizes understanding of instructions and care plan provided today and agrees to view in MyChart. Active MyChart status and patient understanding of how to access instructions and care plan via MyChart confirmed with patient.     Jeanie Cooks, PhD [facility], [facility] Social Worker Direct Dial: [phone]  Fax: [phone]

## 2023-11-05 NOTE — Patient Outreach (Signed)
 Care Coordination   Follow Up Visit Note   11/05/2023 Name: Alexandra Parsons MRN: [id number] DOB: 1960/12/24  Alexandra Parsons is a 63 y.o. year old female who sees Drue Novel, Nolon Rod, MD for primary care. I spoke with  Dorathy Kinsman by phone today.  What matters to the patients health and wellness today?  Medicaid application     Goals Addressed             This Visit's Progress    Care Coordination Activities   On track    Care Coordination Interventions: Patient stated that she is currently on workman's comp and it was cut after 1 month and 23 days and it is now being appealed. She is using her 401 funds to live off of for now and hopes to get her workman's comp back.  SW will refer the patient to the walk in clinic to apply for medicaid. The SW went over all the other SDOH questions and no other needs at this time. Sw will follow p on 11/19/2023 at 3:00 pm         SDOH assessments and interventions completed:  Yes  SDOH Interventions Today    Flowsheet Row Most Recent Value  SDOH Interventions   Food Insecurity Interventions Intervention Not Indicated  Housing Interventions Intervention Not Indicated  Transportation Interventions Intervention Not Indicated  Utilities Interventions Intervention Not Indicated        Care Coordination Interventions:  Yes, provided  Interventions Today    Flowsheet Row Most Recent Value  General Interventions   General Interventions Discussed/Reviewed General Interventions Reviewed, Community Resources  [SW provided the patient the [facility] Walk-in clinic information]        Follow up plan: Follow up call scheduled for 11/19/2023 at 3:00 pm    Encounter Outcome:  Patient Visit Completed   Jeanie Cooks, PhD [facility], [facility] Social Worker Direct Dial: [phone]  Fax: [phone]

## 2023-11-19 ENCOUNTER — Ambulatory Visit: Payer: Self-pay | Admitting: Licensed Clinical Social Worker

## 2023-11-19 NOTE — Patient Outreach (Signed)
 Complex Care Management   Visit Note  11/19/2023  Name:  Alexandra Parsons MRN: [id number] DOB: March 15, 1961  Situation: Referral received for Complex Care Management related to  Medicaid walk in clinic  I obtained verbal consent from the patient.  Visit completed with the patient   on the phone  Background:   Past Medical History:  Diagnosis Date   Abnormal Pap smear of cervix    h/o HPV   Chronic pain disorder    arthitis and fibromyalgia   Chronic sinusitis, chronic cough (?COPD) 07/16/2009   Depression    Diabetes mellitus    type 2   Dyslipidemia    Elevated LFTs 11/19/2011   Hypertension    Leukocytosis 11/19/2011   Menopausal syndrome    Neck pain 05/20/2011   Sarcoid    post partum in remission   Submandibular gland swelling    Right    Assessment: Patient Reported Symptoms:  Cognitive    Neurological      HEENT      Cardiovascular      Respiratory      Endocrine      Gastrointestinal      Genitourinary      Integumentary      Musculoskeletal      Psychosocial       There were no vitals filed for this visit.  Medications Reviewed Today   Medications were not reviewed in this encounter     Recommendation:   Patient to go to the Medicaid walk in clinic   Follow Up Plan:   Face to Face appointment date/time: 12/10/2023 at 2:00 PM  Jeanie Cooks, PhD [facility], [facility] Social Worker Direct Dial: [phone]  Fax: [phone]

## 2023-11-19 NOTE — Patient Instructions (Signed)
 Visit Information  Thank you for taking time to visit with me today. Please don't hesitate to contact me if I can be of assistance to you before our next scheduled appointment.  Your next care management appointment is by telephone on 12/10/2023 at 2:00 PM  Telephone follow-up 12/10/2023 AT 2:00PM Please call the care guide team at [phone] if you need to cancel, schedule, or reschedule an appointment.   Please call the Suicide and Crisis Lifeline: 988 go to [facility] Urgent [facility] [address] [place] [phone]) call 911 if you are experiencing a Mental Health or Behavioral Health Crisis or need someone to talk to.  Jeanie Cooks, PhD [facility], [facility] Social Worker Direct Dial: [phone]  Fax: [phone]

## 2023-11-23 ENCOUNTER — Telehealth: Payer: Self-pay | Admitting: Internal Medicine

## 2023-11-23 ENCOUNTER — Other Ambulatory Visit (HOSPITAL_BASED_OUTPATIENT_CLINIC_OR_DEPARTMENT_OTHER): Payer: Self-pay

## 2023-11-23 NOTE — Telephone Encounter (Signed)
 Requesting: oxycodone 5-325mg   Contract: 10/16/22 UDS: 05/24/23 Last Visit: 09/21/23 Next Visit: 01/19/24 Last Refill: 10/26/23 #120 and 0RF   Please Advise

## 2023-11-24 ENCOUNTER — Other Ambulatory Visit (HOSPITAL_BASED_OUTPATIENT_CLINIC_OR_DEPARTMENT_OTHER): Payer: Self-pay

## 2023-11-24 MED ORDER — OXYCODONE-ACETAMINOPHEN 5-325 MG PO TABS
1.0000 | ORAL_TABLET | Freq: Four times a day (QID) | ORAL | 0 refills | Status: DC | PRN
  Filled 2023-11-24: qty 120, 30d supply, fill #0

## 2023-11-24 NOTE — Telephone Encounter (Signed)
 PDMP okay, Rx sent

## 2023-12-10 ENCOUNTER — Other Ambulatory Visit: Payer: Self-pay | Admitting: Licensed Clinical Social Worker

## 2023-12-10 NOTE — Patient Instructions (Signed)
 Visit Information  Thank you for taking time to visit with me today. Please don't hesitate to contact me if I can be of assistance to you before our next scheduled appointment.  Your next care management appointment is by telephone on 12/31/2023 at 1:00 pm   Please call the care guide team at [phone] if you need to cancel, schedule, or reschedule an appointment.   Please call the Suicide and Crisis Lifeline: 988 go to [facility] Urgent [facility] [address] [place] [phone]) call 911 if you are experiencing a Mental Health or Behavioral Health Crisis or need someone to talk to.  Jonda Neighbours, PhD [facility], [facility] Social Worker Direct Dial: [phone]  Fax: [phone]

## 2023-12-10 NOTE — Patient Outreach (Signed)
 Complex Care Management   Visit Note  12/10/2023  Name:  Alexandra Parsons MRN: [id number] DOB: 21-Dec-1960  Situation: Referral received for Complex Care Management related to  Walk in [facility]  I obtained verbal consent from Patient.  Visit completed with patient  on the phone  Background:   Past Medical History:  Diagnosis Date   Abnormal Pap smear of cervix    h/o HPV   Chronic pain disorder    arthitis and fibromyalgia   Chronic sinusitis, chronic cough (?COPD) 07/16/2009   Depression    Diabetes mellitus    type 2   Dyslipidemia    Elevated LFTs 11/19/2011   Hypertension    Leukocytosis 11/19/2011   Menopausal syndrome    Neck pain 05/20/2011   Sarcoid    post partum in remission   Submandibular gland swelling    Right    Assessment: Patient has not completed the application for Medicaid at the walk in clinic due to family schedule and could not complete the CCM assessment because patient was at the hospital with a family member.    Recommendation:   Patient will follow up with walk in Medicaid clinic  Follow Up Plan:   Telephone follow up appointment date/time:  12/31/2023 at 1:00 pm  Jonda Neighbours, PhD [facility], [facility] Social Worker Direct Dial: [phone]  Fax: [phone]

## 2023-12-16 ENCOUNTER — Other Ambulatory Visit (HOSPITAL_BASED_OUTPATIENT_CLINIC_OR_DEPARTMENT_OTHER): Payer: Self-pay

## 2023-12-16 ENCOUNTER — Other Ambulatory Visit: Payer: Self-pay | Admitting: Internal Medicine

## 2023-12-17 ENCOUNTER — Encounter (HOSPITAL_BASED_OUTPATIENT_CLINIC_OR_DEPARTMENT_OTHER): Payer: Self-pay

## 2023-12-17 ENCOUNTER — Telehealth: Payer: Self-pay | Admitting: Internal Medicine

## 2023-12-17 ENCOUNTER — Other Ambulatory Visit: Payer: Self-pay

## 2023-12-17 ENCOUNTER — Other Ambulatory Visit (HOSPITAL_BASED_OUTPATIENT_CLINIC_OR_DEPARTMENT_OTHER): Payer: Self-pay

## 2023-12-17 MED ORDER — OXYCODONE-ACETAMINOPHEN 5-325 MG PO TABS
1.0000 | ORAL_TABLET | Freq: Four times a day (QID) | ORAL | 0 refills | Status: DC | PRN
  Filled 2023-12-17 – 2023-12-30 (×2): qty 120, 30d supply, fill #0
  Filled ????-??-??: fill #0

## 2023-12-17 MED ORDER — ESCITALOPRAM OXALATE 20 MG PO TABS
20.0000 mg | ORAL_TABLET | Freq: Every day | ORAL | 0 refills | Status: DC
  Filled 2023-12-17: qty 90, 90d supply, fill #0

## 2023-12-17 MED ORDER — BISOPROLOL-HYDROCHLOROTHIAZIDE 2.5-6.25 MG PO TABS
1.0000 | ORAL_TABLET | Freq: Every day | ORAL | 0 refills | Status: DC
  Filled 2023-12-17: qty 90, 90d supply, fill #0

## 2023-12-17 MED ORDER — EZETIMIBE 10 MG PO TABS
10.0000 mg | ORAL_TABLET | Freq: Every day | ORAL | 0 refills | Status: DC
  Filled 2023-12-17: qty 90, 90d supply, fill #0

## 2023-12-17 NOTE — Telephone Encounter (Signed)
 PDMP: Slightly early but  will go ahead and refill it

## 2023-12-17 NOTE — Telephone Encounter (Signed)
 Requesting: oxycodone  5-325mg   Contract: 10/16/22 UDS: 05/24/23 Last Visit: 09/21/23 Next Visit: 01/19/24 Last Refill: 11/24/23 #120 and 0RF   Pt requesting early refill, see below-   Patient comment: I am having yo be out of town 4 hours away to take care of my childs father dx with Stage IV small cell lung cancer. He is in the hospital in [place]. I am sbout out of this medication. I am only in town for one day. Can I get a refill?   Please Advise

## 2023-12-30 ENCOUNTER — Other Ambulatory Visit: Payer: Self-pay | Admitting: Internal Medicine

## 2023-12-30 ENCOUNTER — Other Ambulatory Visit (HOSPITAL_BASED_OUTPATIENT_CLINIC_OR_DEPARTMENT_OTHER): Payer: Self-pay

## 2023-12-30 NOTE — Telephone Encounter (Signed)
 Last Fill: 12/17/23 120 tabs/0 RF  Last OV: 09/21/23 Next OV: 01/19/24  Routing to provider for review/authorization.

## 2023-12-30 NOTE — Telephone Encounter (Signed)
 Rx was sent on 12/17/23.

## 2023-12-30 NOTE — Telephone Encounter (Signed)
 Copied from CRM [id number]. Topic: Clinical - Medication Refill >> Dec 30, 2023  3:17 PM [place] E wrote: Medication: oxyCODONE -acetaminophen  (PERCOCET/ROXICET) 5-325 MG tablet  Has the patient contacted their pharmacy? Yes (Agent: If no, request that the patient contact the pharmacy for the refill. If patient does not wish to contact the pharmacy document the reason why and proceed with request.) (Agent: If yes, when and what did the pharmacy advise?)  This is the patient's preferred pharmacy:  [facility] [place] [facility] [address] [place] [address] Phone: [phone] Fax: [phone]  Is this the correct pharmacy for this prescription? Yes If no, delete pharmacy and type the correct one.   Has the prescription been filled recently? Yes, but patient was out of town and not able to pick up the most recent refill.  Is the patient out of the medication? Yes  Has the patient been seen for an appointment in the last year OR does the patient have an upcoming appointment? Yes  Can we respond through MyChart? Yes  Agent: Please be advised that Rx refills may take up to 3 business days. We ask that you follow-up with your pharmacy.

## 2023-12-31 ENCOUNTER — Other Ambulatory Visit: Payer: Self-pay | Admitting: Licensed Clinical Social Worker

## 2023-12-31 NOTE — Patient Outreach (Signed)
 Complex Care Management   Visit Note  12/31/2023  Name:  Alexandra Parsons MRN: [id number] DOB: 1961/07/23  Situation: Referral received for Complex Care Management related to SDOH Barriers:  Insurance needs wants to apply for Medicare I obtained verbal consent from Patient.  Visit completed with patient  on the phone  Background:   Past Medical History:  Diagnosis Date   Abnormal Pap smear of cervix    h/o HPV   Chronic pain disorder    arthitis and fibromyalgia   Chronic sinusitis, chronic cough (?COPD) 07/16/2009   Depression    Diabetes mellitus    type 2   Dyslipidemia    Elevated LFTs 11/19/2011   Hypertension    Leukocytosis 11/19/2011   Menopausal syndrome    Neck pain 05/20/2011   Sarcoid    post partum in remission   Submandibular gland swelling    Right    Assessment: Patient stated that every time the walk in clinic calls her the phone disconnected, she is currently out of town with her ex spouse who was diagnosed with terminal cancer. Patient will like the link emailed to her for e-pass so that she can apply online for Medicaid.     Recommendation:   none  Follow Up Plan:   Telephone follow up appointment date/time:  01/14/2024 at 2:00 pm  Jonda Neighbours, PhD [facility], [facility] Social Worker Direct Dial: [phone]  Fax: [phone]

## 2023-12-31 NOTE — Patient Instructions (Signed)
 Visit Information  Thank you for taking time to visit with me today. Please don't hesitate to contact me if I can be of assistance to you before our next scheduled appointment.  Your next care management appointment is by telephone on 01/14/2024 at 2:00 pm    Please call the care guide team at [phone] if you need to cancel, schedule, or reschedule an appointment.   Please call the Suicide and Crisis Lifeline: 988 go to [facility] Urgent [facility] [address], [place] [phone]) call 911 if you are experiencing a Mental Health or Behavioral Health Crisis or need someone to talk to.  Jonda Neighbours, PhD [facility], [facility] Social Worker Direct Dial: [phone]  Fax: [phone]

## 2024-01-12 ENCOUNTER — Other Ambulatory Visit (HOSPITAL_BASED_OUTPATIENT_CLINIC_OR_DEPARTMENT_OTHER): Payer: Self-pay

## 2024-01-12 ENCOUNTER — Ambulatory Visit (INDEPENDENT_AMBULATORY_CARE_PROVIDER_SITE_OTHER): Payer: Self-pay | Admitting: Internal Medicine

## 2024-01-12 ENCOUNTER — Other Ambulatory Visit: Payer: Self-pay

## 2024-01-12 ENCOUNTER — Encounter: Payer: Self-pay | Admitting: Internal Medicine

## 2024-01-12 ENCOUNTER — Other Ambulatory Visit: Payer: Self-pay | Admitting: Internal Medicine

## 2024-01-12 VITALS — BP 124/68 | HR 69 | Temp 98.1°F | Resp 16 | Ht 67.0 in | Wt 166.5 lb

## 2024-01-12 DIAGNOSIS — E119 Type 2 diabetes mellitus without complications: Secondary | ICD-10-CM

## 2024-01-12 DIAGNOSIS — E7800 Pure hypercholesterolemia, unspecified: Secondary | ICD-10-CM

## 2024-01-12 DIAGNOSIS — Z7984 Long term (current) use of oral hypoglycemic drugs: Secondary | ICD-10-CM

## 2024-01-12 DIAGNOSIS — Z5971 Insufficient health insurance coverage: Secondary | ICD-10-CM

## 2024-01-12 DIAGNOSIS — I10 Essential (primary) hypertension: Secondary | ICD-10-CM

## 2024-01-12 MED ORDER — ESCITALOPRAM OXALATE 20 MG PO TABS
20.0000 mg | ORAL_TABLET | Freq: Every day | ORAL | 0 refills | Status: DC
  Filled 2024-01-12 – 2024-03-14 (×2): qty 30, 30d supply, fill #0

## 2024-01-12 MED ORDER — EZETIMIBE 10 MG PO TABS
10.0000 mg | ORAL_TABLET | Freq: Every day | ORAL | 0 refills | Status: DC
  Filled 2024-01-12 – 2024-03-14 (×2): qty 30, 30d supply, fill #0

## 2024-01-12 MED ORDER — BISOPROLOL-HYDROCHLOROTHIAZIDE 2.5-6.25 MG PO TABS
1.0000 | ORAL_TABLET | Freq: Every day | ORAL | 0 refills | Status: DC
  Filled 2024-01-12 – 2024-03-14 (×2): qty 30, 30d supply, fill #0

## 2024-01-12 NOTE — Progress Notes (Unsigned)
 Subjective:    Patient ID: Alexandra Parsons, female    DOB: [birth year], 63 y.o.   MRN: [id number]  DOS:  01/12/2024 Type of visit - description: Routine office visit  Since the last office visit, things are about the same. Chronic medical problems addressed. Ambulatory CBGs are infrequently checked but usually in the 120s. Ambulatory BP according to the patient WNL.  Wt Readings from Last 3 Encounters:  01/12/24 166 lb 8 oz (75.5 kg)  09/21/23 169 lb 8 oz (76.9 kg)  06/25/23 167 lb 6.4 oz (75.9 kg)     Review of Systems See above   Past Medical History:  Diagnosis Date   Abnormal Pap smear of cervix    h/o HPV   Chronic pain disorder    arthitis and fibromyalgia   Chronic sinusitis, chronic cough (?COPD) 07/16/2009   Depression    Diabetes mellitus    type 2   Dyslipidemia    Elevated LFTs 11/19/2011   Hypertension    Leukocytosis 11/19/2011   Menopausal syndrome    Neck pain 05/20/2011   Sarcoid    post partum in remission   Submandibular gland swelling    Right    Past Surgical History:  Procedure Laterality Date   abdominal plasty remotely     FINE NEEDLE ASPIRATION Left 12/09/2021   Procedure: LEFT KNEE  NEEDLE ASPIRATION;  Surgeon: Wilhelmenia Harada, MD;  Location: [facility] OR;  Service: Orthopedics;  Laterality: Left;   NASAL SEPTUM SURGERY  12/26/2015   ORIF ELBOW FRACTURE Right 12/09/2021   Procedure: OPEN REDUCTION INTERNAL FIXATION (ORIF) ELBOW/OLECRANON FRACTURE;  Surgeon: Wilhelmenia Harada, MD;  Location: [facility] OR;  Service: Orthopedics;  Laterality: Right;   pilondial cyst removal     TUBAL LIGATION     TURBINATE REDUCTION Bilateral 12/26/2015    Current Outpatient Medications  Medication Instructions   aspirin  EC 81 mg, Daily   bisoprolol -hydrochlorothiazide  (ZIAC ) 2.5-6.25 MG tablet 1 tablet, Oral, Daily   blood glucose meter kit and supplies KIT Use up to 4 times daily as directed   Cholecalciferol (VITAMIN D3) 50 MCG (2000 UT) CAPS Take by mouth.    escitalopram  (LEXAPRO ) 20 mg, Oral, Daily   ezetimibe  (ZETIA ) 10 mg, Oral, Daily   glucose blood test strip Use as directed up to 4 times daily   Lancets MISC Use as directed 4 times daily   metFORMIN  (GLUCOPHAGE ) 1,000 mg, Oral, Daily with breakfast   methocarbamol  (ROBAXIN ) 500 mg, Oral, Every 8 hours PRN   oxyCODONE -acetaminophen  (PERCOCET/ROXICET) 5-325 MG tablet 1 tablet, Oral, Every 6 hours PRN   pioglitazone  (ACTOS ) 30 mg, Oral, Daily   rosuvastatin  (CRESTOR ) 10 mg, Oral, Every M-W-F   traZODone  (DESYREL ) 100 mg, Oral, At bedtime PRN       Objective:   Physical Exam BP 124/68   Pulse 69   Temp 98.1 F (36.7 C) (Oral)   Resp 16   Ht 5\' 7"  (1.702 m)   Wt 166 lb 8 oz (75.5 kg)   SpO2 96%   BMI 26.08 kg/m  General:   Well developed, NAD, BMI noted. HEENT:  Normocephalic . Face symmetric, atraumatic Lungs:  CTA B Normal respiratory effort, no intercostal retractions, no accessory muscle use. Heart: RRR,  no murmur.  Lower extremities: no pretibial edema bilaterally  Skin: Not pale. Not jaundice Neurologic:  alert & oriented X3.  Speech normal, gait appropriate for age and unassisted Psych--  Cognition and judgment appear intact.  Cooperative with normal attention span and  concentration.  Behavior appropriate. No anxious or depressed appearing.      Assessment     Problem list DM HTN Dyslipidemia: intolerant to zocor  ~ 2014 and  Lipitor 04/2018 Vit d def   CT 09-2018 ---COPD ---Pulmonary nodules, declined further CTs on 05-2019 --- (+)  Coronary calcium    Smoker ~1  ppd Psych: --Depression anxiety --ADHD MSK:  Fibromyalgia  (see OV 01/07/2021, 05/09/2021) Neck pain, chronic, on pain meds prn, UDS 09-2013 risk; intolerant to vicodin, oxycodone  ok Fall 12/2021: R elbow, L knee fracture GYN: --BTL, Menopausal , h/o  Abnormal Pap smear, HPV Elevated LFTs: Hepatitis serology (-) 2008 H/o Sarcoid postpartum in remission H/o Chronic cough: Chronic sinusitis?  COPD? H/o Leukocytosis  H/o +  PPD   PLAN DM: On pioglitazone , metformin .  Has been unable to afford Januvia  for a while.  Check A1c micro HTN: BP looks good, on Ziac .  Check a BMP High cholesterol: On rosuvastatin  and Zetia , check FLP AST ALT Pain management: On Percocet, last refill was 12/30/2023. Weight loss: Has stabilized, reports she is eating smaller portions. Social:  -Our social worker encouraged her to get Medicaid but that has not happened.  Currently with no insurance. -Blood work: States she needs to be done elsewhere due to cost.  A list of needed labs provided to the patient.  She is aware that without blood work we cannot refill her medications. - Stress: helping her ex husband who has cancer. RTC 4 months.

## 2024-01-12 NOTE — Patient Instructions (Signed)
  Please be sure you get your blood work within 2 weeks. Without blood work we will not be able to refill your medications. After you have your blood work, call the office to be sure the results got to my desk.   Check the  blood pressure regularly Blood pressure goal:  between 110/65 and  135/85. If it is consistently higher or lower, let me know    Next office visit for a checkup in 4 months Please make an appointment before you leave today

## 2024-01-13 NOTE — Assessment & Plan Note (Signed)
 DM: On pioglitazone , metformin .  Has been unable to afford Januvia  for a while.  Check A1c micro HTN: BP looks good, on Ziac .  Check a BMP High cholesterol: On rosuvastatin  and Zetia , check FLP AST ALT Pain management: On Percocet, last refill was 12/30/2023. Weight loss: Has stabilized, reports she is eating smaller portions. Social:  -Our social worker encouraged her to get Medicaid but that has not happened.  Currently with no insurance. -Blood work: States she needs to be done elsewhere due to cost.  A list of needed labs provided to the patient.  She is aware that without blood work we cannot refill her medications. - Stress: helping her ex husband who has cancer. RTC 4 months.

## 2024-01-14 ENCOUNTER — Other Ambulatory Visit: Payer: Self-pay | Admitting: Licensed Clinical Social Worker

## 2024-01-14 NOTE — Patient Outreach (Signed)
 Complex Care Management   Visit Note  01/14/2024  Name:  Alexandra Parsons MRN: [id number] DOB: [birth year]  Situation: Referral received for Complex Care Management related to Needing Medicaid  I obtained verbal consent from Patient.  Visit completed with patient  on the phone  Background:   Past Medical History:  Diagnosis Date   Abnormal Pap smear of cervix    h/o HPV   Chronic pain disorder    arthitis and fibromyalgia   Chronic sinusitis, chronic cough (?COPD) 07/16/2009   Depression    Diabetes mellitus    type 2   Dyslipidemia    Elevated LFTs 11/19/2011   Hypertension    Leukocytosis 11/19/2011   Menopausal syndrome    Neck pain 05/20/2011   Sarcoid    post partum in remission   Submandibular gland swelling    Right    Assessment: Patient Reported Symptoms:  Cognitive        Neurological      HEENT        Cardiovascular      Respiratory      Endocrine      Gastrointestinal        Genitourinary      Integumentary      Musculoskeletal          Psychosocial       Quality of Family Relationships: helpful, involved, supportive Do you feel physically threatened by others?: No      01/12/2024    1:47 PM  Depression screen PHQ 2/9  Decreased Interest 1  Down, Depressed, Hopeless 1  PHQ - 2 Score 2  Altered sleeping 0  Tired, decreased energy 1  Change in appetite 0  Feeling bad or failure about yourself  1  Trouble concentrating 0  Moving slowly or fidgety/restless 0  Suicidal thoughts 0  PHQ-9 Score 4    There were no vitals filed for this visit.  Medications Reviewed Today   Medications were not reviewed in this encounter     Recommendation:   Walk-In Medicaid Clinic  Follow Up Plan:   Telephone follow up appointment date/time:  02/10/2024 at 2:00 pm  Jonda Neighbours, PhD [employer], [facility] Social Worker Direct Dial: [phone]  Fax: [phone]

## 2024-01-14 NOTE — Patient Instructions (Signed)
 Visit Information  Thank you for taking time to visit with me today. Please don't hesitate to contact me if I can be of assistance to you before our next scheduled appointment.  Your next care management appointment is by telephone on 02/10/2024 at 2:00 pm    Please call the care guide team at [phone] if you need to cancel, schedule, or reschedule an appointment.   Please call the Suicide and Crisis Lifeline: 988 go to [facility] Urgent [facility] [address] [place] [phone]) call 911 if you are experiencing a Mental Health or Behavioral Health Crisis or need someone to talk to.  Jonda Neighbours, PhD [facility], [facility] Social Worker Direct Dial: [phone]  Fax: [phone]

## 2024-01-19 ENCOUNTER — Ambulatory Visit: Payer: Self-pay | Admitting: Internal Medicine

## 2024-01-24 ENCOUNTER — Other Ambulatory Visit (HOSPITAL_BASED_OUTPATIENT_CLINIC_OR_DEPARTMENT_OTHER): Payer: Self-pay

## 2024-01-31 LAB — PROTEIN / CREATININE RATIO, URINE: Albumin, U: 1

## 2024-01-31 LAB — COMPREHENSIVE METABOLIC PANEL WITH GFR
A1c: 6
Albumin, Urine POC: 1
Calcium: 9.6 (ref 8.7–10.7)
eGFR: 95

## 2024-01-31 LAB — BASIC METABOLIC PANEL WITH GFR
BUN: 17 (ref 4–21)
CO2: 29 — AB (ref 13–22)
Chloride: 107 (ref 99–108)
Creatinine: 0.7 (ref 0.5–1.1)
Glucose: 97
Potassium: 4.8 meq/L (ref 3.5–5.1)
Sodium: 138 (ref 137–147)

## 2024-01-31 LAB — HEPATIC FUNCTION PANEL
ALT: 19 U/L (ref 7–35)
AST: 21 (ref 13–35)

## 2024-01-31 LAB — LIPID PANEL
Cholesterol: 156 (ref 0–200)
HDL: 23 — AB (ref 35–70)
LDL Cholesterol: 101
Triglycerides: 205 — AB (ref 40–160)

## 2024-01-31 LAB — HEMOGLOBIN A1C: Hemoglobin A1C: 6

## 2024-02-10 ENCOUNTER — Other Ambulatory Visit: Payer: Self-pay | Admitting: Licensed Clinical Social Worker

## 2024-02-10 NOTE — Patient Outreach (Signed)
 Complex Care Management   Visit Note  02/10/2024  Name:  Alexandra Parsons MRN: [id number] DOB: 1961/02/10  Situation: Referral received for Complex Care Management related to Medicaid I obtained verbal consent from Patient.  Visit completed with patient  on the phone  Background:   Past Medical History:  Diagnosis Date   Abnormal Pap smear of cervix    h/o HPV   Chronic pain disorder    arthitis and fibromyalgia   Chronic sinusitis, chronic cough (?COPD) 07/16/2009   Depression    Diabetes mellitus    type 2   Dyslipidemia    Elevated LFTs 11/19/2011   Hypertension    Leukocytosis 11/19/2011   Menopausal syndrome    Neck pain 05/20/2011   Sarcoid    post partum in remission   Submandibular gland swelling    Right    Assessment: Patient has not bee able to complete the application at the walk in clinic, she is currently at [facility], [place]  caring for her ex husband who is really sick and stated that she will co to the county office there to apply or get her daughter to assist hr on the computer  SDOH Interventions    Flowsheet Row Patient Outreach from 02/10/2024 in [place] POPULATION HEALTH DEPARTMENT Patient Outreach from 12/31/2023 in [place] POPULATION HEALTH DEPARTMENT Patient Outreach from 12/10/2023 in [place] POPULATION HEALTH DEPARTMENT Care Coordination from 11/19/2023 in [place] POPULATION HEALTH DEPARTMENT Care Coordination from 11/05/2023 in [facility] Care Coordination Care Coordination from 10/19/2023 in [facility] Care Coordination  SDOH Interventions        Food Insecurity Interventions Intervention Not Indicated Intervention Not Indicated Intervention Not Indicated Intervention Not Indicated Intervention Not Indicated Intervention Not Indicated  Housing Interventions Intervention Not Indicated Intervention Not Indicated Intervention Not Indicated Intervention Not Indicated Intervention Not Indicated  Intervention Not Indicated  Transportation Interventions Intervention Not Indicated Intervention Not Indicated Intervention Not Indicated Intervention Not Indicated Intervention Not Indicated Intervention Not Indicated  Utilities Interventions Intervention Not Indicated Intervention Not Indicated Intervention Not Indicated Intervention Not Indicated Intervention Not Indicated Intervention Not Indicated  Financial Strain Interventions Intervention Not Indicated Intervention Not Indicated -- -- -- --       Recommendation:   none  Follow Up Plan:   Closing From:  Complex Care Management  Tobias CHARM Maranda HEDWIG, PhD [facility], [facility] Social Worker Direct Dial: [phone]  Fax: [phone]

## 2024-02-10 NOTE — Patient Instructions (Signed)
 Visit Information  Thank you for taking time to visit with me today. Please don't hesitate to contact me if I can be of assistance to you before our next scheduled appointment.  Your next care management appointment is no further scheduled appointments.  oClosing From: Complex Care Management.  Please call the care guide team at [phone] if you need to cancel, schedule, or reschedule an appointment.   Please call the Suicide and Crisis Lifeline: 988 go to [facility] Urgent [facility] [address], [place] [phone]) call 911 if you are experiencing a Mental Health or Behavioral Health Crisis or need someone to talk to.  Jonda Neighbours, PhD [facility], [facility] Social Worker Direct Dial: [phone]  Fax: [phone]

## 2024-02-15 ENCOUNTER — Other Ambulatory Visit (HOSPITAL_BASED_OUTPATIENT_CLINIC_OR_DEPARTMENT_OTHER): Payer: Self-pay

## 2024-02-15 ENCOUNTER — Telehealth: Payer: Self-pay | Admitting: Internal Medicine

## 2024-02-15 ENCOUNTER — Telehealth: Payer: Self-pay

## 2024-02-15 MED ORDER — METHOCARBAMOL 500 MG PO TABS
500.0000 mg | ORAL_TABLET | Freq: Three times a day (TID) | ORAL | 3 refills | Status: DC | PRN
  Filled 2024-02-15: qty 90, 30d supply, fill #0
  Filled 2024-03-14: qty 90, 30d supply, fill #1
  Filled 2024-04-24: qty 90, 30d supply, fill #2
  Filled 2024-05-31: qty 90, 30d supply, fill #3

## 2024-02-15 MED ORDER — OXYCODONE-ACETAMINOPHEN 5-325 MG PO TABS
1.0000 | ORAL_TABLET | Freq: Four times a day (QID) | ORAL | 0 refills | Status: DC | PRN
  Filled 2024-02-15: qty 120, 30d supply, fill #0

## 2024-02-15 NOTE — Telephone Encounter (Signed)
 Requesting: oxycodone  Contract: No UDS: 05/24/2023 Last Visit: 01/12/2024 Next Visit: 06/07/2024 Last Refill: 12/17/2023  Patient also would like methocarbamol  Please Advise

## 2024-02-15 NOTE — Telephone Encounter (Signed)
PDMP okay, prescriptions sent 

## 2024-02-15 NOTE — Telephone Encounter (Signed)
 Copied from CRM [id number]. Topic: Clinical - Medication Refill >> Feb 15, 2024  2:15 PM Robinson H wrote: Medication: oxyCODONE -acetaminophen  (PERCOCET/ROXICET) 5-325 MG tablet, methocarbamol  (ROBAXIN ) 500 MG tablet  Has the patient contacted their pharmacy? No, already talking to agent (Agent: If no, request that the patient contact the pharmacy for the refill. If patient does not wish to contact the pharmacy document the reason why and proceed with request.) (Agent: If yes, when and what did the pharmacy advise?)  This is the patient's preferred pharmacy:  [facility] [place] [facility] [address] [place] [address] Phone: [phone] Fax: [phone]  Is this the correct pharmacy for this prescription? Yes If no, delete pharmacy and type the correct one.   Has the prescription been filled recently? No  Is the patient out of the medication? Yes  Has the patient been seen for an appointment in the last year OR does the patient have an upcoming appointment? Yes  Can we respond through MyChart? Yes  Agent: Please be advised that Rx refills may take up to 3 business days. We ask that you follow-up with your pharmacy.

## 2024-02-15 NOTE — Telephone Encounter (Signed)
 Copied from CRM [id number]. Topic: Clinical - Lab/Test Results >> Feb 15, 2024  2:13 PM Robinson H wrote: Reason for CRM: Patient is calling to verify if Dr. Amon received her lab results from [facility], states they were suppose to fax them to the office.  [place] [phone]

## 2024-02-23 NOTE — Telephone Encounter (Signed)
 Total cholesterol 156, LDL 101, TG 205. A1c 6.0 Creatinine 0.7.  LFTs normal. On rosuvastatin  3 times a week and Zetia .. Advised patient: Cholesterol used to be better, for now be sure she takes rosuvastatin  3 times a week and Zetia  daily.

## 2024-03-14 ENCOUNTER — Other Ambulatory Visit (HOSPITAL_BASED_OUTPATIENT_CLINIC_OR_DEPARTMENT_OTHER): Payer: Self-pay

## 2024-03-14 ENCOUNTER — Telehealth: Payer: Self-pay | Admitting: Internal Medicine

## 2024-03-14 ENCOUNTER — Other Ambulatory Visit: Payer: Self-pay | Admitting: Family

## 2024-03-14 ENCOUNTER — Other Ambulatory Visit: Payer: Self-pay

## 2024-03-14 MED ORDER — OXYCODONE-ACETAMINOPHEN 5-325 MG PO TABS
1.0000 | ORAL_TABLET | Freq: Four times a day (QID) | ORAL | 0 refills | Status: DC | PRN
  Filled 2024-03-14 – 2024-03-17 (×2): qty 120, 30d supply, fill #0

## 2024-03-14 MED ORDER — ROSUVASTATIN CALCIUM 10 MG PO TABS
10.0000 mg | ORAL_TABLET | ORAL | 1 refills | Status: DC
  Filled 2024-03-14 (×2): qty 12, 28d supply, fill #0
  Filled 2024-04-24: qty 12, 28d supply, fill #1

## 2024-03-14 NOTE — Telephone Encounter (Signed)
 PDMP was okay, prescription sent by Kenney Roys on 03/14/2024

## 2024-03-14 NOTE — Telephone Encounter (Signed)
 Amon- would you be willing to resend for them to fill today? I know Pt has been having to travel to [place] area to care for her ex-husband who is in hospice, she may need prior to 8/8.

## 2024-03-14 NOTE — Telephone Encounter (Signed)
 Requesting: oxycodone  5-325mg    Contract: 10/16/22 UDS: 05/24/23 Last Visit: 01/12/24 Next Visit: 06/07/24 Last Refill: 02/15/24 #120 and 0RF   Several days early  Please Advise

## 2024-03-17 ENCOUNTER — Other Ambulatory Visit (HOSPITAL_BASED_OUTPATIENT_CLINIC_OR_DEPARTMENT_OTHER): Payer: Self-pay

## 2024-04-24 ENCOUNTER — Other Ambulatory Visit: Payer: Self-pay | Admitting: Internal Medicine

## 2024-04-24 ENCOUNTER — Other Ambulatory Visit (HOSPITAL_COMMUNITY): Payer: Self-pay

## 2024-04-24 ENCOUNTER — Other Ambulatory Visit: Payer: Self-pay

## 2024-04-24 MED ORDER — ESCITALOPRAM OXALATE 20 MG PO TABS
20.0000 mg | ORAL_TABLET | Freq: Every day | ORAL | 0 refills | Status: AC
  Filled 2024-04-24: qty 90, 90d supply, fill #0

## 2024-04-24 MED ORDER — EZETIMIBE 10 MG PO TABS
10.0000 mg | ORAL_TABLET | Freq: Every day | ORAL | 0 refills | Status: AC
  Filled 2024-04-24: qty 90, 90d supply, fill #0

## 2024-04-24 MED ORDER — BISOPROLOL-HYDROCHLOROTHIAZIDE 2.5-6.25 MG PO TABS
1.0000 | ORAL_TABLET | Freq: Every day | ORAL | 0 refills | Status: AC
  Filled 2024-04-24 – 2024-05-31 (×2): qty 90, 90d supply, fill #0

## 2024-04-28 ENCOUNTER — Other Ambulatory Visit (HOSPITAL_BASED_OUTPATIENT_CLINIC_OR_DEPARTMENT_OTHER): Payer: Self-pay

## 2024-04-28 ENCOUNTER — Telehealth: Payer: Self-pay | Admitting: Internal Medicine

## 2024-04-28 MED ORDER — OXYCODONE-ACETAMINOPHEN 5-325 MG PO TABS
1.0000 | ORAL_TABLET | Freq: Four times a day (QID) | ORAL | 0 refills | Status: DC | PRN
  Filled 2024-04-28: qty 120, 30d supply, fill #0

## 2024-04-28 NOTE — Telephone Encounter (Signed)
 Copied from CRM [id number]. Topic: Clinical - Medication Refill >> Apr 28, 2024 10:15 AM Revonda D wrote: Medication: oxyCODONE -acetaminophen  (PERCOCET/ROXICET) 5-325 MG tablet  Has the patient contacted their pharmacy? Yes (Agent: If no, request that the patient contact the pharmacy for the refill. If patient does not wish to contact the pharmacy document the reason why and proceed with request.) (Agent: If yes, when and what did the pharmacy advise?)  This is the patient's preferred pharmacy:  [facility] [place] [facility] [address] [place] [address] Phone: [phone] Fax: [phone]  Is this the correct pharmacy for this prescription? Yes If no, delete pharmacy and type the correct one.   Has the prescription been filled recently? No  Is the patient out of the medication? Yes  Has the patient been seen for an appointment in the last year OR does the patient have an upcoming appointment? Yes  Can we respond through MyChart? Yes  Agent: Please be advised that Rx refills may take up to 3 business days. We ask that you follow-up with your pharmacy.

## 2024-04-28 NOTE — Telephone Encounter (Signed)
 Requesting: oxycodone  5-325mg   Contract:10/16/22 UDS: 05/24/23 Last Visit: 01/12/24 Next Visit: 06/07/24 Last Refill: 03/14/24 #120 and 0RF   Please Advise

## 2024-04-28 NOTE — Telephone Encounter (Signed)
 PDMP okay, Rx sent

## 2024-05-31 ENCOUNTER — Telehealth: Payer: Self-pay | Admitting: Internal Medicine

## 2024-05-31 ENCOUNTER — Other Ambulatory Visit: Payer: Self-pay

## 2024-05-31 ENCOUNTER — Other Ambulatory Visit (HOSPITAL_BASED_OUTPATIENT_CLINIC_OR_DEPARTMENT_OTHER): Payer: Self-pay

## 2024-05-31 MED ORDER — ROSUVASTATIN CALCIUM 10 MG PO TABS
10.0000 mg | ORAL_TABLET | ORAL | 1 refills | Status: AC
  Filled 2024-05-31: qty 36, 84d supply, fill #0

## 2024-05-31 MED ORDER — OXYCODONE-ACETAMINOPHEN 5-325 MG PO TABS
1.0000 | ORAL_TABLET | Freq: Four times a day (QID) | ORAL | 0 refills | Status: DC | PRN
  Filled 2024-05-31: qty 120, 30d supply, fill #0

## 2024-05-31 NOTE — Telephone Encounter (Signed)
 Requesting: oxycodone  5-325mg  Contract:10/16/22 UDS:05/24/23 Last Visit: 01/12/24 Next Visit: 06/07/24 Last Refill: 04/28/24 #120 and 0RF   Please Advise

## 2024-05-31 NOTE — Telephone Encounter (Signed)
 PDMP okay, Rx sent

## 2024-06-01 ENCOUNTER — Other Ambulatory Visit (HOSPITAL_BASED_OUTPATIENT_CLINIC_OR_DEPARTMENT_OTHER): Payer: Self-pay

## 2024-06-01 ENCOUNTER — Other Ambulatory Visit: Payer: Self-pay

## 2024-06-05 ENCOUNTER — Other Ambulatory Visit (HOSPITAL_BASED_OUTPATIENT_CLINIC_OR_DEPARTMENT_OTHER): Payer: Self-pay

## 2024-06-06 ENCOUNTER — Other Ambulatory Visit (HOSPITAL_BASED_OUTPATIENT_CLINIC_OR_DEPARTMENT_OTHER): Payer: Self-pay

## 2024-06-07 ENCOUNTER — Encounter: Payer: Self-pay | Admitting: Internal Medicine

## 2024-06-07 ENCOUNTER — Ambulatory Visit: Payer: Self-pay | Admitting: Internal Medicine

## 2024-06-07 VITALS — BP 138/80 | HR 66 | Temp 97.8°F | Resp 16 | Ht 67.0 in | Wt 164.0 lb

## 2024-06-07 DIAGNOSIS — Z1231 Encounter for screening mammogram for malignant neoplasm of breast: Secondary | ICD-10-CM

## 2024-06-07 DIAGNOSIS — E7800 Pure hypercholesterolemia, unspecified: Secondary | ICD-10-CM

## 2024-06-07 DIAGNOSIS — I10 Essential (primary) hypertension: Secondary | ICD-10-CM

## 2024-06-07 DIAGNOSIS — Z79899 Other long term (current) drug therapy: Secondary | ICD-10-CM

## 2024-06-07 DIAGNOSIS — Z5971 Insufficient health insurance coverage: Secondary | ICD-10-CM

## 2024-06-07 DIAGNOSIS — G894 Chronic pain syndrome: Secondary | ICD-10-CM

## 2024-06-07 DIAGNOSIS — Z7984 Long term (current) use of oral hypoglycemic drugs: Secondary | ICD-10-CM

## 2024-06-07 DIAGNOSIS — E119 Type 2 diabetes mellitus without complications: Secondary | ICD-10-CM

## 2024-06-07 DIAGNOSIS — Z23 Encounter for immunization: Secondary | ICD-10-CM

## 2024-06-07 DIAGNOSIS — M542 Cervicalgia: Secondary | ICD-10-CM

## 2024-06-07 NOTE — Progress Notes (Unsigned)
 Subjective:    Patient ID: Alexandra Parsons, female    DOB: Alexandra 15, 1962, 63 y.o.   MRN: [id number]  DOS:  06/07/2024 Follow-up  Discussed the use of AI scribe software for clinical note transcription with the patient, who gave verbal consent to proceed.  History of Present Illness Alexandra Parsons is a 63 year old female with sarcoidosis and diabetes who presents for routine follow-up and medication management.  Emotional stress and psychosocial concerns - Significant emotional stress related to managing funeral arrangements for her ex-husband - Eager to have an appliance removed from her arm - Desires to start therapy - Plans to return to work part-time in approximately six months  Chronic pain management - Takes oxycodone  every six hours for pain - Sometimes forgets to take oxycodone   Diabetes mellitus - Takes metformin  and pioglitazone  - Does not monitor blood sugar at home  Hypertension and hyperlipidemia - Takes Ziac , Crestor , and Zetia  - Does not monitor blood pressure at home  Sarcoidosis and pulmonary symptoms - History of sarcoidosis with prior CT scan showing nodules - No recent fever, chills, cough, or mucus production  Allergic rhinitis - Allergies, particularly to ragweed  Tobacco use - Continues to smoke      Review of Systems See above   Past Medical History:  Diagnosis Date   Abnormal Pap smear of cervix    h/o HPV   Chronic pain disorder    arthitis and fibromyalgia   Chronic sinusitis, chronic cough (?COPD) 07/16/2009   Depression    Diabetes mellitus    type 2   Dyslipidemia    Elevated LFTs 11/19/2011   Hypertension    Leukocytosis 11/19/2011   Menopausal syndrome    Neck pain 05/20/2011   Sarcoid    post partum in remission   Submandibular gland swelling    Right    Past Surgical History:  Procedure Laterality Date   abdominal plasty remotely     FINE NEEDLE ASPIRATION Left 12/09/2021   Procedure: LEFT KNEE  NEEDLE ASPIRATION;   Surgeon: Genelle Standing, MD;  Location: [facility] OR;  Service: Orthopedics;  Laterality: Left;   NASAL SEPTUM SURGERY  12/26/2015   ORIF ELBOW FRACTURE Right 12/09/2021   Procedure: OPEN REDUCTION INTERNAL FIXATION (ORIF) ELBOW/OLECRANON FRACTURE;  Surgeon: Genelle Standing, MD;  Location: [facility] OR;  Service: Orthopedics;  Laterality: Right;   pilondial cyst removal     TUBAL LIGATION     TURBINATE REDUCTION Bilateral 12/26/2015    Current Outpatient Medications  Medication Instructions   aspirin  EC 81 mg, Daily   bisoprolol -hydrochlorothiazide  (ZIAC ) 2.5-6.25 MG tablet 1 tablet, Oral, Daily   blood glucose meter kit and supplies KIT Use up to 4 times daily as directed   Cholecalciferol (VITAMIN D3) 50 MCG (2000 UT) CAPS Take by mouth.   escitalopram  (LEXAPRO ) 20 mg, Oral, Daily   ezetimibe  (ZETIA ) 10 mg, Oral, Daily   glucose blood test strip Use as directed up to 4 times daily   Lancets MISC Use as directed 4 times daily   metFORMIN  (GLUCOPHAGE ) 1,000 mg, Oral, Daily with breakfast   methocarbamol  (ROBAXIN ) 500 mg, Oral, Every 8 hours PRN   oxyCODONE -acetaminophen  (PERCOCET/ROXICET) 5-325 MG tablet 1 tablet, Oral, Every 6 hours PRN   pioglitazone  (ACTOS ) 30 mg, Oral, Daily   rosuvastatin  (CRESTOR ) 10 mg, Oral, Every M-W-F   traZODone  (DESYREL ) 100 mg, Oral, At bedtime PRN       Objective:   Physical Exam BP 138/80   Pulse 66  Temp 97.8 F (36.6 C) (Oral)   Resp 16   Ht 5' 7 (1.702 m)   Wt 164 lb (74.4 kg)   SpO2 96%   BMI 25.69 kg/m  General:   Well developed, NAD, BMI noted. HEENT:  Normocephalic . Face symmetric, atraumatic Lungs:  CTA B Normal respiratory effort, no intercostal retractions, no accessory muscle use. Heart: RRR,  no murmur.  DM foot exam: No edema, good perfusion, pinprick examination normal Skin: Not pale. Not jaundice Neurologic:  alert & oriented X3.  Speech normal, gait appropriate for age and unassisted Psych--  Cognition and judgment appear intact.   Cooperative with normal attention span and concentration.  Behavior appropriate. No anxious or depressed appearing.      Assessment   Problem list DM HTN Dyslipidemia: intolerant to zocor  ~ 2014 and  Lipitor 04/2018 Vit d def   CT 09-2018 ---COPD ---Pulmonary nodules, declined further CTs on 05-2019 --- (+)  Coronary calcium    Smoker ~1  ppd Psych: --Depression anxiety --ADHD MSK:  Fibromyalgia  (see OV 01/07/2021, 05/09/2021) Neck pain, chronic, on pain meds , intolerant to vicodin, oxycodone  ok Fall 12/2021: R elbow, L knee fracture GYN: --BTL, Menopausal , h/o  Abnormal Pap smear, HPV Elevated LFTs: Hepatitis serology (-) 2008 H/o Sarcoid postpartum in remission H/o Chronic cough: Chronic sinusitis? COPD? H/o Leukocytosis  H/o +  PPD   Assessment and Plan Assessment & Plan DM type II: Type 2 diabetes mellitus managed with metformin  and pioglitazone .  No change, check A1c HTN Hypertension managed with Ziac .  BP looks good today, check BMP High cholesterol: Dyslipidemia managed with rosuvastatin  and Zetia . Last LDL was 101 mg/dL.  Not at goal, for now continue the same medicines and recheck at the next visit.  Chronic neck pain Chronic neck pain managed with oxycodone .  Check a UDS. Refill oxycodone  as needed.  (Last refill was 06/01/2025) Chronic obstructive pulmonary disease (COPD) COPD with no current symptoms. Continues to smoke and is not ready to quit. Anxiety: Anxiety managed with Lexapro . Insomnia:Occasional insomnia managed with trazodone  as needed. Abnormal chest CT Previous lung CT was abnormal showing nodules, has declined at follow-up CT before and again today. Acknowledges risk of cancer but opts not to pursue further imaging at this time.  She send findings are related to sarcoidosis. General Health Maintenance Flu shot recommended and administered. MMG ordered. Recommend COVID booster and PNM 20 at the pharmacy. 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  States that the [facility] case has been settled and she will start working in few months.  These are good news for the patient. RTC 4 months.

## 2024-06-07 NOTE — Patient Instructions (Addendum)
 GO TO THE LAB :  Provide the urine sample.  Without it I cannot prescribe your oxycodone   Then, go to the front desk for the checkout Please make an appointment for a checkup in 4 months   Go to the outside lab and provide the following labs: BMP and CBC, hypertension A1c, diabetes Few days after you get the blood done, please call the office to be sure we got the results.   YOUR PLAN (AI generated)   TYPE 2 DIABETES MELLITUS: Your diabetes is managed with metformin  and pioglitazone . - Please check A1c  HYPERTENSION: Your high blood pressure is managed with Ziac . -Continue taking Ziac  as prescribed. - Please check a BMP  DYSLIPIDEMIA: Your cholesterol levels are managed with rosuvastatin  and Zetia . -Continue taking rosuvastatin  three times a week and Zetia  as prescribed. -We will recheck your lipid panel at your next visit.  CHRONIC NECK PAIN: You have chronic neck pain managed with oxycodone . -We will check a urine drug screen. -Will refill your pain medication next month  CHRONIC OBSTRUCTIVE PULMONARY DISEASE (COPD): You have COPD but no current symptoms. You continue to smoke and are not ready to quit. -Continue to monitor your symptoms and let us  know if anything changes.  TOBACCO USE DISORDER: You continue to smoke despite knowing the health risks and are not ready to quit at this time. -Consider quitting smoking in the future for your overall health.    ANXIETY DISORDER: Your anxiety is managed with Lexapro . -Continue taking Lexapro  as prescribed.  INSOMNIA: You have occasional insomnia managed with trazodone  as needed. -Continue taking trazodone  as needed for sleep.  ABNORMAL CHEST CT:  Previous abnormal chest CT showing nodules. You declined further imaging at this time. -Continue monitoring your symptoms and let us  know if anything changes.  GENERAL HEALTH MAINTENANCE: We discussed general health maintenance including vaccinations and screenings. -You received  a flu shot today. -A mammogram was ordered  -We recommend getting a COVID booster and pneumonia shot at the pharmacy.

## 2024-06-08 NOTE — Assessment & Plan Note (Signed)
 DM type II: Type 2 diabetes mellitus managed with metformin  and pioglitazone .  No change, check A1c  HTN Hypertension managed with Ziac .  BP looks good today, check BMP High cholesterol: Dyslipidemia managed with rosuvastatin  and Zetia . Last LDL was 101 mg/dL.  Not at goal, for now continue the same medicines and recheck at the next visit. Chronic neck pain Chronic neck pain managed with oxycodone .  Check a UDS. Refill oxycodone  as needed.  (Last refill was 06/01/2025) COPD with no current symptoms. Continues to smoke and is not ready to quit. Anxiety: Anxiety managed with Lexapro . Insomnia:Occasional insomnia managed with trazodone  as needed. Abnormal chest CT Previous lung CT was abnormal showing nodules, has declined at follow-up CT before and again today. Acknowledges risk of cancer but opts not to pursue further imaging at this time.  Thinks findings are related to sarcoidosis. General Health Maintenance Flu shot recommended and administered. MMG ordered. Recommend COVID booster and PNM 20 at the pharmacy. 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  States that the [facility] case has been settled and she will start working in few months.  These are good news for the patient. RTC 4 months.

## 2024-06-09 LAB — DRUG MONITORING PANEL 375977 , URINE
Alcohol Metabolites: NEGATIVE ng/mL (ref <500)
Amphetamines: NEGATIVE ng/mL (ref <500)
Barbiturates: NEGATIVE ng/mL (ref <300)
Benzodiazepines: NEGATIVE ng/mL (ref <100)
Cocaine Metabolite: NEGATIVE ng/mL (ref <150)
Codeine: NEGATIVE ng/mL (ref <50)
Desmethyltramadol: NEGATIVE ng/mL (ref <100)
Hydrocodone: NEGATIVE ng/mL (ref <50)
Hydromorphone: NEGATIVE ng/mL (ref <50)
Marijuana Metabolite: NEGATIVE ng/mL (ref <20)
Morphine: NEGATIVE ng/mL (ref <50)
Norhydrocodone: NEGATIVE ng/mL (ref <50)
Noroxycodone: 2353 ng/mL — ABNORMAL HIGH (ref <50)
Opiates: NEGATIVE ng/mL (ref <100)
Oxycodone: 1718 ng/mL — ABNORMAL HIGH (ref <50)
Oxycodone: POSITIVE ng/mL — AB (ref <100)
Oxymorphone: 411 ng/mL — ABNORMAL HIGH (ref <50)
Tramadol: NEGATIVE ng/mL (ref <100)

## 2024-06-09 LAB — DM TEMPLATE

## 2024-06-10 ENCOUNTER — Ambulatory Visit: Payer: Self-pay | Admitting: Internal Medicine

## 2024-07-04 ENCOUNTER — Other Ambulatory Visit: Payer: Self-pay | Admitting: Internal Medicine

## 2024-07-04 ENCOUNTER — Other Ambulatory Visit (HOSPITAL_BASED_OUTPATIENT_CLINIC_OR_DEPARTMENT_OTHER): Payer: Self-pay

## 2024-07-04 MED ORDER — METHOCARBAMOL 500 MG PO TABS
500.0000 mg | ORAL_TABLET | Freq: Three times a day (TID) | ORAL | 0 refills | Status: DC | PRN
  Filled 2024-07-04: qty 90, 30d supply, fill #0

## 2024-07-22 ENCOUNTER — Other Ambulatory Visit: Payer: Self-pay

## 2024-07-22 ENCOUNTER — Emergency Department (HOSPITAL_BASED_OUTPATIENT_CLINIC_OR_DEPARTMENT_OTHER)
Admission: EM | Admit: 2024-07-22 | Discharge: 2024-07-22 | Disposition: A | Discharge status: Home / Self Care | Payer: Self-pay | Attending: Emergency Medicine | Admitting: Emergency Medicine

## 2024-07-22 DIAGNOSIS — Z7984 Long term (current) use of oral hypoglycemic drugs: Secondary | ICD-10-CM | POA: Insufficient documentation

## 2024-07-22 DIAGNOSIS — K047 Periapical abscess without sinus: Secondary | ICD-10-CM | POA: Insufficient documentation

## 2024-07-22 DIAGNOSIS — Z7982 Long term (current) use of aspirin: Secondary | ICD-10-CM | POA: Insufficient documentation

## 2024-07-22 DIAGNOSIS — E119 Type 2 diabetes mellitus without complications: Secondary | ICD-10-CM | POA: Insufficient documentation

## 2024-07-22 MED ORDER — AMOXICILLIN-POT CLAVULANATE 875-125 MG PO TABS: 1.0000 | ORAL_TABLET | Freq: Two times a day (BID) | ORAL | 0 refills | Status: AC

## 2024-07-22 NOTE — Discharge Instructions (Signed)
You have been seen by your caregiver because of dental pain.  SEEK MEDICAL ATTENTION IF: The exam and treatment you received today has been provided on an emergency basis only. This is not a substitute for complete medical or dental care. If your problem worsens or new symptoms (problems) appear, and you are unable to arrange prompt follow-up care with your dentist, call or return to this location. CALL YOUR DENTIST OR RETURN IMMEDIATELY IF you develop a fever, rash, difficulty breathing or swallowing, neck or facial swelling, or other potentially serious concerns.  

## 2024-07-22 NOTE — ED Provider Notes (Signed)
  EMERGENCY DEPARTMENT AT [facility] Provider Note   CSN: [id number] Arrival date & time: 07/22/24  0630     Patient presents with: Dental Pain   Alexandra Parsons is a 63 y.o. female with a past medical history of chronic opiate therapy, previous facial injury with loss of the majority of her teeth, diabetes, sarcoid, fibromyalgia, chronic tobacco abuse who presents for dental pain.  Patient has had a few days of progressively worsening pain above her left upper lateral incisor and swelling into the face.  She denies fever chills difficulty swallowing.    Dental Pain      Prior to Admission medications  Medication Sig Start Date End Date Taking? Authorizing Provider  aspirin  EC 81 MG tablet Take 81 mg by mouth daily. Swallow whole.    [provider]  bisoprolol -hydrochlorothiazide  (ZIAC ) 2.5-6.25 MG tablet Take 1 tablet by mouth daily. 04/24/24   Amon Aloysius BRAVO, MD  blood glucose meter kit and supplies KIT Use up to 4 times daily as directed 12/26/21   Love, Sharlet RAMAN, PA-C  Cholecalciferol (VITAMIN D3) 50 MCG (2000 UT) CAPS Take by mouth.    [provider]  escitalopram  (LEXAPRO ) 20 MG tablet Take 1 tablet (20 mg total) by mouth daily. 04/24/24   Amon Aloysius BRAVO, MD  ezetimibe  (ZETIA ) 10 MG tablet Take 1 tablet (10 mg total) by mouth daily. 04/24/24   Paz, Jose E, MD  glucose blood test strip Use as directed up to 4 times daily 12/26/21   Love, Sharlet RAMAN, PA-C  Lancets MISC Use as directed 4 times daily 12/26/21   Love, Sharlet RAMAN, PA-C  metFORMIN  (GLUCOPHAGE ) 1000 MG tablet Take 1 tablet (1,000 mg total) by mouth daily with breakfast. 10/09/22   Paz, Jose E, MD  methocarbamol  (ROBAXIN ) 500 MG tablet Take 1 tablet (500 mg total) by mouth every 8 (eight) hours as needed for muscle spasms. 07/04/24   Paz, Jose E, MD  oxyCODONE -acetaminophen  (PERCOCET/ROXICET) 5-325 MG tablet Take 1 tablet by mouth every 6 (six) hours as needed. 05/31/24   Amon Aloysius BRAVO, MD   pioglitazone  (ACTOS ) 30 MG tablet Take 1 tablet (30 mg total) by mouth daily. 09/21/23   Paz, Jose E, MD  rosuvastatin  (CRESTOR ) 10 MG tablet Take 1 tablet (10 mg total) by mouth every Monday, Wednesday, and Friday. 05/31/24   Amon Aloysius BRAVO, MD  traZODone  (DESYREL ) 100 MG tablet Take 1 tablet (100 mg total) by mouth at bedtime as needed for sleep. 09/21/23   Amon Aloysius BRAVO, MD    Allergies: Benadryl [diphenhydramine], Erythromycin, Fluoxetine hcl, and Venlafaxine    Review of Systems  Updated Vital Signs BP (!) 182/95 (BP Location: Right Arm)   Pulse 77   Temp 99.3 F (37.4 C) (Oral)   Resp 16   Ht 5' 6.75 (1.695 m)   Wt 72.1 kg   SpO2 99%   BMI 25.09 kg/m   Physical Exam Vitals and nursing note reviewed.  Constitutional:      General: She is not in acute distress.    Appearance: She is well-developed. She is not diaphoretic.  HENT:     Head: Normocephalic and atraumatic.     Right Ear: External ear normal.     Left Ear: External ear normal.     Nose: Nose normal.     Mouth/Throat:     Mouth: Mucous membranes are moist.     Dentition: Gingival swelling and dental caries present.  Comments: There is gingival erythema and swelling above the left lateral incisor.  Poor dentition multiple dental caries minimal facial swelling Eyes:     General: No scleral icterus.    Conjunctiva/sclera: Conjunctivae normal.  Cardiovascular:     Rate and Rhythm: Normal rate and regular rhythm.     Heart sounds: Normal heart sounds. No murmur heard.    No friction rub. No gallop.  Pulmonary:     Effort: Pulmonary effort is normal. No respiratory distress.     Breath sounds: Normal breath sounds.  Abdominal:     General: Bowel sounds are normal. There is no distension.     Palpations: Abdomen is soft. There is no mass.     Tenderness: There is no abdominal tenderness. There is no guarding.  Musculoskeletal:     Cervical back: Normal range of motion.  Skin:    General: Skin is warm and dry.   Neurological:     Mental Status: She is alert and oriented to person, place, and time.  Psychiatric:        Behavior: Behavior normal.     (all labs ordered are listed, but only abnormal results are displayed) Labs Reviewed - No data to display  EKG: None  Radiology: No results found.   Procedures   Medications Ordered in the ED - No data to display                                  Medical Decision Making  Patient with toothache.  No gross abscess.  Exam unconcerning for Ludwig's angina or spread of infection.  Will treat with penicillin and pain medicine.  Urged patient to follow-up with dentist.        Final diagnoses:  Dental infection    ED Discharge Orders     None          Arloa Chroman, PA-C 07/22/24 1008    Towana Ozell BROCKS, MD 07/22/24 1712

## 2024-07-22 NOTE — ED Triage Notes (Signed)
 Pt to ED via POV with c/o pain to top left jaw along with some swelling x 2 days. Pt able to maintain secretions, denies recent injury to mouth/teeth, sts it's infected, I know it. Pt denies having a primary dentist. Denies fevers.

## 2024-07-27 ENCOUNTER — Other Ambulatory Visit: Payer: Self-pay | Admitting: Internal Medicine

## 2024-07-27 ENCOUNTER — Other Ambulatory Visit (HOSPITAL_BASED_OUTPATIENT_CLINIC_OR_DEPARTMENT_OTHER): Payer: Self-pay

## 2024-07-27 MED ORDER — METHOCARBAMOL 500 MG PO TABS
500.0000 mg | ORAL_TABLET | Freq: Three times a day (TID) | ORAL | 0 refills | Status: DC | PRN
  Filled 2024-07-27: qty 90, 30d supply, fill #0

## 2024-07-27 MED ORDER — OXYCODONE-ACETAMINOPHEN 5-325 MG PO TABS
1.0000 | ORAL_TABLET | Freq: Four times a day (QID) | ORAL | 0 refills | Status: DC | PRN
  Filled 2024-07-27: qty 120, 30d supply, fill #0

## 2024-07-27 NOTE — Telephone Encounter (Unsigned)
 Copied from CRM #[id number]. Topic: Clinical - Medication Refill >> Jul 27, 2024  3:49 PM Dedra B wrote: Medication: oxyCODONE -acetaminophen  (PERCOCET/ROXICET) 5-325 MG tablet  Has the patient contacted their pharmacy? No, no refills  This is the patient's preferred pharmacy:  [facility] [place] [facility] [address] [place] [address] Phone: [phone] Fax: [phone]  Is this the correct pharmacy for this prescription? Yes  Has the prescription been filled recently? No  Is the patient out of the medication? Yes  Has the patient been seen for an appointment in the last year OR does the patient have an upcoming appointment? Yes  Can we respond through MyChart? Yes  Agent: Please be advised that Rx refills may take up to 3 business days. We ask that you follow-up with your pharmacy.

## 2024-07-27 NOTE — Telephone Encounter (Signed)
 Requesting: oxycodone  5-325mg   Contract:10/16/22 UDS:06/07/24 Last Visit: 06/07/24 Next Visit:10/11/24 Last Refill: 05/31/24 #120 and 9mq   Please Advise

## 2024-09-05 ENCOUNTER — Other Ambulatory Visit (HOSPITAL_BASED_OUTPATIENT_CLINIC_OR_DEPARTMENT_OTHER): Payer: Self-pay

## 2024-09-05 ENCOUNTER — Other Ambulatory Visit: Payer: Self-pay | Admitting: Internal Medicine

## 2024-09-05 MED ORDER — METHOCARBAMOL 500 MG PO TABS
500.0000 mg | ORAL_TABLET | Freq: Three times a day (TID) | ORAL | 0 refills | Status: AC | PRN
  Filled 2024-09-05: qty 90, 30d supply, fill #0

## 2024-09-06 ENCOUNTER — Other Ambulatory Visit (HOSPITAL_BASED_OUTPATIENT_CLINIC_OR_DEPARTMENT_OTHER): Payer: Self-pay

## 2024-09-06 ENCOUNTER — Telehealth: Payer: Self-pay

## 2024-09-06 MED ORDER — OXYCODONE-ACETAMINOPHEN 5-325 MG PO TABS
1.0000 | ORAL_TABLET | Freq: Four times a day (QID) | ORAL | 0 refills | Status: AC | PRN
  Filled 2024-09-06: qty 120, 30d supply, fill #0

## 2024-09-06 NOTE — Telephone Encounter (Signed)
 Copied from CRM #[id number]. Topic: Clinical - Medication Refill >> Sep 06, 2024  4:12 PM Antwanette L wrote: Medication: oxyCODONE -acetaminophen  (PERCOCET/ROXICET) 5-325 MG tablet  Has the patient contacted their pharmacy? Yes   This is the patient's preferred pharmacy:  [facility] [place] - [facility] [address], [address] [place] [address] Phone: [phone] Fax: [phone]   Is this the correct pharmacy for this prescription? Yes   Has the prescription been filled recently? Yes. Last refill was 07/27/24  Is the patient out of the medication? Yes  Has the patient been seen for an appointment in the last year OR does the patient have an upcoming appointment? Yes. Last office visit with Dr. Amon was 06/07/24 and next appt is 10/11/24  Can we respond through MyChart? No. Patient can be reached at [phone]  Agent: Please be advised that Rx refills may take up to 3 business days. We ask that you follow-up with your pharmacy.

## 2024-09-06 NOTE — Telephone Encounter (Signed)
 Requesting: oxycodone   Contract: 10/16/22 UDS: 06/07/24 Last Visit: 06/07/24 Next Visit: 10/11/24 Last Refill: 07/28/24 #120 and 0RF   Please Advise

## 2024-09-06 NOTE — Telephone Encounter (Signed)
 PDMP okay, Rx sent

## 2024-10-11 ENCOUNTER — Ambulatory Visit: Payer: Self-pay | Admitting: Internal Medicine
